# Patient Record
Sex: Male | Born: 1950
Health system: Southern US, Community
[De-identification: ages and names within clinical notes are randomized; demographics above are authoritative.]

## PROBLEM LIST (undated history)

## (undated) DIAGNOSIS — I10 Essential (primary) hypertension: Secondary | ICD-10-CM

## (undated) DIAGNOSIS — I493 Ventricular premature depolarization: Secondary | ICD-10-CM

## (undated) DIAGNOSIS — F0390 Unspecified dementia without behavioral disturbance: Secondary | ICD-10-CM

## (undated) DIAGNOSIS — G4730 Sleep apnea, unspecified: Secondary | ICD-10-CM

## (undated) DIAGNOSIS — I639 Cerebral infarction, unspecified: Secondary | ICD-10-CM

## (undated) DIAGNOSIS — M1990 Unspecified osteoarthritis, unspecified site: Secondary | ICD-10-CM

## (undated) DIAGNOSIS — E785 Hyperlipidemia, unspecified: Secondary | ICD-10-CM

## (undated) HISTORY — DX: Ventricular premature depolarization: I49.3

## (undated) HISTORY — DX: Sleep apnea, unspecified: G47.30

## (undated) HISTORY — DX: Cerebral infarction, unspecified: I63.9

## (undated) HISTORY — PX: OTHER SURGICAL HISTORY: SHX169

## (undated) HISTORY — DX: Hyperlipidemia, unspecified: E78.5

---

## 2000-03-28 ENCOUNTER — Ambulatory Visit (HOSPITAL_COMMUNITY): Admission: RE | Admit: 2000-03-28 | Discharge: 2000-03-28 | Payer: Self-pay | Admitting: Internal Medicine

## 2005-05-23 ENCOUNTER — Ambulatory Visit (HOSPITAL_COMMUNITY): Admission: RE | Admit: 2005-05-23 | Discharge: 2005-05-23 | Payer: Self-pay | Admitting: Internal Medicine

## 2008-06-27 HISTORY — PX: ANKLE ARTHROSCOPY: SUR85

## 2008-08-21 ENCOUNTER — Ambulatory Visit: Payer: Self-pay | Admitting: Vascular Surgery

## 2008-08-21 ENCOUNTER — Encounter (INDEPENDENT_AMBULATORY_CARE_PROVIDER_SITE_OTHER): Payer: Self-pay | Admitting: Internal Medicine

## 2008-08-21 ENCOUNTER — Ambulatory Visit (HOSPITAL_COMMUNITY): Admission: RE | Admit: 2008-08-21 | Discharge: 2008-08-21 | Payer: Self-pay | Admitting: Internal Medicine

## 2009-06-27 HISTORY — PX: JOINT REPLACEMENT: SHX530

## 2009-08-23 ENCOUNTER — Encounter: Admission: RE | Admit: 2009-08-23 | Discharge: 2009-08-23 | Payer: Self-pay | Admitting: Orthopaedic Surgery

## 2011-11-23 ENCOUNTER — Other Ambulatory Visit: Payer: Self-pay | Admitting: Orthopaedic Surgery

## 2011-11-23 DIAGNOSIS — M25511 Pain in right shoulder: Secondary | ICD-10-CM

## 2011-11-28 ENCOUNTER — Ambulatory Visit
Admission: RE | Admit: 2011-11-28 | Discharge: 2011-11-28 | Disposition: A | Discharge status: Home / Self Care | Payer: BC Managed Care – PPO | Source: Ambulatory Visit | Attending: Orthopaedic Surgery | Admitting: Orthopaedic Surgery

## 2011-11-28 DIAGNOSIS — M25511 Pain in right shoulder: Secondary | ICD-10-CM

## 2011-12-30 ENCOUNTER — Other Ambulatory Visit: Payer: Self-pay | Admitting: Orthopedic Surgery

## 2012-01-05 ENCOUNTER — Encounter (HOSPITAL_COMMUNITY): Payer: Self-pay

## 2012-01-05 ENCOUNTER — Encounter (HOSPITAL_COMMUNITY)
Admission: RE | Admit: 2012-01-05 | Discharge: 2012-01-05 | Disposition: A | Discharge status: Home / Self Care | Payer: 59 | Source: Ambulatory Visit | Attending: Orthopedic Surgery | Admitting: Orthopedic Surgery

## 2012-01-05 ENCOUNTER — Other Ambulatory Visit (HOSPITAL_COMMUNITY): Payer: 59

## 2012-01-05 HISTORY — DX: Essential (primary) hypertension: I10

## 2012-01-05 LAB — BASIC METABOLIC PANEL
CO2: 26 mEq/L (ref 19–32)
Calcium: 9.6 mg/dL (ref 8.4–10.5)
Glucose, Bld: 86 mg/dL (ref 70–99)
Sodium: 141 mEq/L (ref 135–145)

## 2012-01-05 LAB — TYPE AND SCREEN: ABO/RH(D): B POS

## 2012-01-05 LAB — CBC
Hemoglobin: 14.2 g/dL (ref 13.0–17.0)
Platelets: 300 10*3/uL (ref 150–400)

## 2012-01-05 LAB — SURGICAL PCR SCREEN: Staphylococcus aureus: NEGATIVE

## 2012-01-05 NOTE — Pre-Procedure Instructions (Signed)
20 CLARANCE BOLLARD  01/05/2012   Your procedure is scheduled on:  01/12/12  Report to [facility] at 530 AM.  Call this number if you have problems the morning of surgery: [phone]   Remember:   Do not eat food:After Midnight.  May have clear liquids:until Midnight .  Clear liquids include soda, tea, black coffee, apple or grape juice, broth.  Take these medicines the morning of surgery with A SIP OF WATER:verapamil    Do not wear jewelry, make-up or nail polish.  Do not wear lotions, powders, or perfumes. You may wear deodorant.  Do not shave 48 hours prior to surgery. Men may shave face and neck.  Do not bring valuables to the hospital.  Contacts, dentures or bridgework may not be worn into surgery.  Leave suitcase in the car. After surgery it may be brought to your room.  For patients admitted to the hospital, checkout time is 11:00 AM the day of discharge.   Patients discharged the day of surgery will not be allowed to drive home.  Name and phone number of your driver: family  Special Instructions: CHG Shower Use Special Wash: 1/2 bottle night before surgery and 1/2 bottle morning of surgery.   Please read over the following fact sheets that you were given: Pain Booklet, Coughing and Deep Breathing, Blood Transfusion Information, MRSA Information and Surgical Site Infection Prevention

## 2012-01-09 NOTE — Consult Note (Signed)
Anesthesia Chart Review:  Patient is a 61 year old male scheduled for a right shoulder hemi-arthroplasty by Dr. Ave Filter on 01/12/12.  History includes HTN, non-smoker, prior joint replacement surgery.    EKG from 01/05/12 showed NSR with sinus arrhythmia, borderline LA abnormality, LAD.  CXR on 01/05/12 showed slightly hyperinflated lungs without acute infiltrate.   Labs noted.  Orders were not available at the time of patient's PAT visit, so additional orders such as coags and UA are scheduled to be drawn on the day of surgery.  If same day labs are reasonable and no significant change in patient's status, then anticipate he can proceed as planned.  Shonna Chock, PA-C

## 2012-01-10 NOTE — Progress Notes (Signed)
DR [facility] OFFICE CALLED [phone])  SPOKE WITH LAURA... ADVISED THAT DR CHANDLER HAS NOT SIGNED OFF HIS ORDERS FOR THIS PT .Marland KitchenMarland Kitchen

## 2012-01-11 ENCOUNTER — Other Ambulatory Visit: Payer: Self-pay | Admitting: Orthopedic Surgery

## 2012-01-11 NOTE — Progress Notes (Signed)
Office called unit and unsure of where sign/held orders located in EPIC.  Luberta Robertson at office to look under other orders under chart review.  She verbalized understanding.//L. Aikam Vinje,RN

## 2012-01-12 ENCOUNTER — Encounter (HOSPITAL_COMMUNITY)
Admission: RE | Disposition: A | Discharge status: Home / Self Care | Payer: Self-pay | Source: Ambulatory Visit | Attending: Orthopedic Surgery

## 2012-01-12 ENCOUNTER — Inpatient Hospital Stay (HOSPITAL_COMMUNITY)
Admission: RE | Admit: 2012-01-12 | Discharge: 2012-01-13 | DRG: 483 | Disposition: A | Discharge status: Home / Self Care | Payer: 59 | Source: Ambulatory Visit | Attending: Orthopedic Surgery | Admitting: Orthopedic Surgery

## 2012-01-12 ENCOUNTER — Ambulatory Visit (HOSPITAL_COMMUNITY): Payer: 59 | Admitting: Vascular Surgery

## 2012-01-12 ENCOUNTER — Encounter (HOSPITAL_COMMUNITY): Payer: Self-pay | Admitting: Vascular Surgery

## 2012-01-12 ENCOUNTER — Ambulatory Visit (HOSPITAL_COMMUNITY): Payer: 59

## 2012-01-12 ENCOUNTER — Encounter (HOSPITAL_COMMUNITY): Payer: Self-pay | Admitting: *Deleted

## 2012-01-12 DIAGNOSIS — Z96619 Presence of unspecified artificial shoulder joint: Secondary | ICD-10-CM

## 2012-01-12 DIAGNOSIS — Z79899 Other long term (current) drug therapy: Secondary | ICD-10-CM

## 2012-01-12 DIAGNOSIS — M87029 Idiopathic aseptic necrosis of unspecified humerus: Secondary | ICD-10-CM | POA: Diagnosis present

## 2012-01-12 DIAGNOSIS — I10 Essential (primary) hypertension: Secondary | ICD-10-CM | POA: Diagnosis present

## 2012-01-12 DIAGNOSIS — M19019 Primary osteoarthritis, unspecified shoulder: Principal | ICD-10-CM

## 2012-01-12 HISTORY — PX: SHOULDER HEMI-ARTHROPLASTY: SHX5049

## 2012-01-12 LAB — DIFFERENTIAL
Basophils Absolute: 0 10*3/uL (ref 0.0–0.1)
Lymphocytes Relative: 23 % (ref 12–46)
Lymphs Abs: 1.2 10*3/uL (ref 0.7–4.0)
Monocytes Absolute: 0.6 10*3/uL (ref 0.1–1.0)
Neutro Abs: 3.3 10*3/uL (ref 1.7–7.7)

## 2012-01-12 LAB — CBC
Hemoglobin: 14.8 g/dL (ref 13.0–17.0)
MCH: 30.9 pg (ref 26.0–34.0)
MCV: 91.2 fL (ref 78.0–100.0)
RBC: 4.79 MIL/uL (ref 4.22–5.81)

## 2012-01-12 LAB — HEPATIC FUNCTION PANEL
AST: 23 U/L (ref 0–37)
Albumin: 4.1 g/dL (ref 3.5–5.2)
Total Bilirubin: 0.3 mg/dL (ref 0.3–1.2)

## 2012-01-12 LAB — URINALYSIS, ROUTINE W REFLEX MICROSCOPIC
Bilirubin Urine: NEGATIVE
Leukocytes, UA: NEGATIVE
Nitrite: NEGATIVE
Specific Gravity, Urine: 1.026 (ref 1.005–1.030)
pH: 5.5 (ref 5.0–8.0)

## 2012-01-12 SURGERY — HEMIARTHROPLASTY, SHOULDER
Anesthesia: Choice | Site: Shoulder | Laterality: Right | Wound class: Clean

## 2012-01-12 MED ORDER — METOCLOPRAMIDE HCL 5 MG/ML IJ SOLN: 5.0000 mg | Freq: Three times a day (TID) | INTRAMUSCULAR | Status: DC | PRN

## 2012-01-12 MED ORDER — HYDROMORPHONE HCL PF 1 MG/ML IJ SOLN
0.2500 mg | INTRAMUSCULAR | Status: DC | PRN
  Administered 2012-01-12 (×2): 0.5 mg via INTRAVENOUS

## 2012-01-12 MED ORDER — ONDANSETRON HCL 4 MG/2ML IJ SOLN
INTRAMUSCULAR | Status: DC | PRN
  Administered 2012-01-12: 4 mg via INTRAVENOUS

## 2012-01-12 MED ORDER — METHOCARBAMOL 500 MG PO TABS
500.0000 mg | ORAL_TABLET | Freq: Four times a day (QID) | ORAL | Status: DC | PRN
  Administered 2012-01-12 – 2012-01-13 (×2): 500 mg via ORAL
  Filled 2012-01-12 (×3): qty 1

## 2012-01-12 MED ORDER — CEFAZOLIN SODIUM-DEXTROSE 2-3 GM-% IV SOLR
INTRAVENOUS | Status: AC
  Administered 2012-01-12: 2 g via INTRAVENOUS
  Filled 2012-01-12: qty 50

## 2012-01-12 MED ORDER — PHENYLEPHRINE HCL 10 MG/ML IJ SOLN
10.0000 mg | INTRAVENOUS | Status: DC | PRN
  Administered 2012-01-12: 20 ug/min via INTRAVENOUS

## 2012-01-12 MED ORDER — OXYCODONE-ACETAMINOPHEN 5-325 MG PO TABS
ORAL_TABLET | ORAL | Status: AC
  Filled 2012-01-12: qty 1

## 2012-01-12 MED ORDER — OXYCODONE-ACETAMINOPHEN 5-325 MG PO TABS
1.0000 | ORAL_TABLET | ORAL | Status: DC | PRN
  Administered 2012-01-12 – 2012-01-13 (×5): 1 via ORAL
  Filled 2012-01-12 (×4): qty 1

## 2012-01-12 MED ORDER — CEFAZOLIN SODIUM 1-5 GM-% IV SOLN
INTRAVENOUS | Status: AC
  Administered 2012-01-12: 1000 mg
  Filled 2012-01-12: qty 50

## 2012-01-12 MED ORDER — ONDANSETRON HCL 4 MG PO TABS: 4.0000 mg | ORAL_TABLET | Freq: Four times a day (QID) | ORAL | Status: DC | PRN

## 2012-01-12 MED ORDER — POVIDONE-IODINE 7.5 % EX SOLN
Freq: Once | CUTANEOUS | Status: DC
  Filled 2012-01-12: qty 118

## 2012-01-12 MED ORDER — ASPIRIN EC 325 MG PO TBEC
325.0000 mg | DELAYED_RELEASE_TABLET | Freq: Two times a day (BID) | ORAL | Status: DC
  Administered 2012-01-12 – 2012-01-13 (×2): 325 mg via ORAL
  Filled 2012-01-12 (×3): qty 1

## 2012-01-12 MED ORDER — NEOSTIGMINE METHYLSULFATE 1 MG/ML IJ SOLN
INTRAMUSCULAR | Status: DC | PRN
  Administered 2012-01-12: 5 mg via INTRAVENOUS

## 2012-01-12 MED ORDER — PROPOFOL 10 MG/ML IV EMUL
INTRAVENOUS | Status: DC | PRN
  Administered 2012-01-12: 200 mg via INTRAVENOUS

## 2012-01-12 MED ORDER — ACETAMINOPHEN 10 MG/ML IV SOLN
INTRAVENOUS | Status: AC
  Filled 2012-01-12: qty 100

## 2012-01-12 MED ORDER — CEFAZOLIN SODIUM-DEXTROSE 2-3 GM-% IV SOLR: 2.0000 g | INTRAVENOUS | Status: DC

## 2012-01-12 MED ORDER — MORPHINE SULFATE 2 MG/ML IJ SOLN: 2.0000 mg | INTRAMUSCULAR | Status: DC | PRN

## 2012-01-12 MED ORDER — METHOCARBAMOL 100 MG/ML IJ SOLN
500.0000 mg | Freq: Four times a day (QID) | INTRAVENOUS | Status: DC | PRN
  Administered 2012-01-12: 500 mg via INTRAVENOUS
  Filled 2012-01-12: qty 5

## 2012-01-12 MED ORDER — ONDANSETRON HCL 4 MG/2ML IJ SOLN: 4.0000 mg | Freq: Four times a day (QID) | INTRAMUSCULAR | Status: DC | PRN

## 2012-01-12 MED ORDER — FENTANYL CITRATE 0.05 MG/ML IJ SOLN
INTRAMUSCULAR | Status: DC | PRN
  Administered 2012-01-12: 50 ug via INTRAVENOUS
  Administered 2012-01-12 (×2): 100 ug via INTRAVENOUS

## 2012-01-12 MED ORDER — MIDAZOLAM HCL 5 MG/5ML IJ SOLN
INTRAMUSCULAR | Status: DC | PRN
  Administered 2012-01-12 (×2): 1 mg via INTRAVENOUS

## 2012-01-12 MED ORDER — MEPERIDINE HCL 25 MG/ML IJ SOLN: 6.2500 mg | INTRAMUSCULAR | Status: DC | PRN

## 2012-01-12 MED ORDER — ALUM & MAG HYDROXIDE-SIMETH 200-200-20 MG/5ML PO SUSP: 30.0000 mL | ORAL | Status: DC | PRN

## 2012-01-12 MED ORDER — LACTATED RINGERS IV SOLN
INTRAVENOUS | Status: DC | PRN
  Administered 2012-01-12 (×2): via INTRAVENOUS

## 2012-01-12 MED ORDER — MENTHOL 3 MG MT LOZG: 1.0000 | LOZENGE | OROMUCOSAL | Status: DC | PRN

## 2012-01-12 MED ORDER — VERAPAMIL HCL ER 240 MG PO TBCR
240.0000 mg | EXTENDED_RELEASE_TABLET | Freq: Every day | ORAL | Status: DC
  Administered 2012-01-13: 240 mg via ORAL
  Filled 2012-01-12: qty 1

## 2012-01-12 MED ORDER — GLYCOPYRROLATE 0.2 MG/ML IJ SOLN
INTRAMUSCULAR | Status: DC | PRN
  Administered 2012-01-12: 0.6 mg via INTRAVENOUS

## 2012-01-12 MED ORDER — HYDROMORPHONE HCL PF 1 MG/ML IJ SOLN
INTRAMUSCULAR | Status: AC
  Filled 2012-01-12: qty 1

## 2012-01-12 MED ORDER — DOCUSATE SODIUM 100 MG PO CAPS
100.0000 mg | ORAL_CAPSULE | Freq: Two times a day (BID) | ORAL | Status: DC
  Administered 2012-01-12 – 2012-01-13 (×2): 100 mg via ORAL
  Filled 2012-01-12 (×3): qty 1

## 2012-01-12 MED ORDER — FLEET ENEMA 7-19 GM/118ML RE ENEM: 1.0000 | ENEMA | Freq: Once | RECTAL | Status: AC | PRN

## 2012-01-12 MED ORDER — ACETAMINOPHEN 325 MG PO TABS: 650.0000 mg | ORAL_TABLET | Freq: Four times a day (QID) | ORAL | Status: DC | PRN

## 2012-01-12 MED ORDER — ACETAMINOPHEN 650 MG RE SUPP: 650.0000 mg | Freq: Four times a day (QID) | RECTAL | Status: DC | PRN

## 2012-01-12 MED ORDER — SODIUM CHLORIDE 0.9 % IR SOLN
Status: DC | PRN
  Administered 2012-01-12: 3000 mL

## 2012-01-12 MED ORDER — SODIUM CHLORIDE 0.9 % IV SOLN
INTRAVENOUS | Status: DC
  Administered 2012-01-13: 02:00:00 via INTRAVENOUS

## 2012-01-12 MED ORDER — ACETAMINOPHEN 10 MG/ML IV SOLN
1000.0000 mg | Freq: Once | INTRAVENOUS | Status: AC | PRN
  Administered 2012-01-12: 1000 mg via INTRAVENOUS

## 2012-01-12 MED ORDER — ROCURONIUM BROMIDE 100 MG/10ML IV SOLN
INTRAVENOUS | Status: DC | PRN
  Administered 2012-01-12: 50 mg via INTRAVENOUS
  Administered 2012-01-12 (×2): 10 mg via INTRAVENOUS

## 2012-01-12 MED ORDER — BISACODYL 5 MG PO TBEC: 5.0000 mg | DELAYED_RELEASE_TABLET | Freq: Every day | ORAL | Status: DC | PRN

## 2012-01-12 MED ORDER — 0.9 % SODIUM CHLORIDE (POUR BTL) OPTIME
TOPICAL | Status: DC | PRN
  Administered 2012-01-12: 1000 mL

## 2012-01-12 MED ORDER — ZOLPIDEM TARTRATE 5 MG PO TABS: 5.0000 mg | ORAL_TABLET | Freq: Every evening | ORAL | Status: DC | PRN

## 2012-01-12 MED ORDER — METOCLOPRAMIDE HCL 10 MG PO TABS: 5.0000 mg | ORAL_TABLET | Freq: Three times a day (TID) | ORAL | Status: DC | PRN

## 2012-01-12 MED ORDER — PROMETHAZINE HCL 25 MG/ML IJ SOLN: 6.2500 mg | INTRAMUSCULAR | Status: DC | PRN

## 2012-01-12 MED ORDER — PHENOL 1.4 % MT LIQD: 1.0000 | OROMUCOSAL | Status: DC | PRN

## 2012-01-12 MED ORDER — BUPIVACAINE-EPINEPHRINE PF 0.5-1:200000 % IJ SOLN
INTRAMUSCULAR | Status: DC | PRN
  Administered 2012-01-12: 30 mL

## 2012-01-12 MED ORDER — DIPHENHYDRAMINE HCL 12.5 MG/5ML PO ELIX: 12.5000 mg | ORAL_SOLUTION | ORAL | Status: DC | PRN

## 2012-01-12 MED ORDER — CEFAZOLIN SODIUM 1-5 GM-% IV SOLN
1.0000 g | Freq: Four times a day (QID) | INTRAVENOUS | Status: AC
  Administered 2012-01-12 – 2012-01-13 (×2): 1 g via INTRAVENOUS
  Filled 2012-01-12 (×3): qty 50

## 2012-01-12 MED ORDER — LIDOCAINE HCL (CARDIAC) 20 MG/ML IV SOLN
INTRAVENOUS | Status: DC | PRN
  Administered 2012-01-12: 100 mg via INTRAVENOUS

## 2012-01-12 SURGICAL SUPPLY — 64 items
BLADE SAW SAG 73X25 THK (BLADE) ×1
BLADE SAW SGTL 73X25 THK (BLADE) ×1 IMPLANT
BLADE SURG 15 STRL LF DISP TIS (BLADE) ×1 IMPLANT
BLADE SURG 15 STRL SS (BLADE) ×2
BOWL SMART MIX CTS (DISPOSABLE) IMPLANT
CHLORAPREP W/TINT 26ML (MISCELLANEOUS) ×2 IMPLANT
CLOTH BEACON ORANGE TIMEOUT ST (SAFETY) ×2 IMPLANT
COVER SURGICAL LIGHT HANDLE (MISCELLANEOUS) ×2 IMPLANT
DRAPE INCISE IOBAN 66X45 STRL (DRAPES) ×2 IMPLANT
DRAPE SURG 17X23 STRL (DRAPES) ×2 IMPLANT
DRAPE U-SHAPE 47X51 STRL (DRAPES) ×2 IMPLANT
DRSG MEPILEX BORDER 4X8 (GAUZE/BANDAGES/DRESSINGS) ×1 IMPLANT
DRSG PAD ABDOMINAL 8X10 ST (GAUZE/BANDAGES/DRESSINGS) ×2 IMPLANT
ELECT CAUTERY BLADE 6.4 (BLADE) ×1 IMPLANT
ELECT REM PT RETURN 9FT ADLT (ELECTROSURGICAL) ×2
ELECTRODE REM PT RTRN 9FT ADLT (ELECTROSURGICAL) ×1 IMPLANT
EVACUATOR 1/8 PVC DRAIN (DRAIN) ×1 IMPLANT
FACESHIELD STD STERILE (MASK) ×1 IMPLANT
GLOVE BIO SURGEON STRL SZ7 (GLOVE) ×2 IMPLANT
GLOVE BIO SURGEON STRL SZ7.5 (GLOVE) ×3 IMPLANT
GLOVE BIOGEL PI IND STRL 8 (GLOVE) ×1 IMPLANT
GLOVE BIOGEL PI INDICATOR 8 (GLOVE) ×3
GLOVE ECLIPSE 8.0 STRL XLNG CF (GLOVE) ×2 IMPLANT
GLOVE NEODERM STER SZ 7 (GLOVE) ×2 IMPLANT
GOWN BRE IMP SLV AUR XL STRL (GOWN DISPOSABLE) ×2 IMPLANT
GOWN PREVENTION PLUS LG XLONG (DISPOSABLE) ×2 IMPLANT
GOWN STRL NON-REIN LRG LVL3 (GOWN DISPOSABLE) ×3 IMPLANT
HEMOSTAT SURGICEL 2X14 (HEMOSTASIS) IMPLANT
HOOD PEEL AWAY FACE SHEILD DIS (HOOD) ×3 IMPLANT
KIT BASIN OR (CUSTOM PROCEDURE TRAY) ×2 IMPLANT
KIT ROOM TURNOVER OR (KITS) ×2 IMPLANT
MANIFOLD NEPTUNE II (INSTRUMENTS) ×2 IMPLANT
NDL HYPO 25GX1X1/2 BEV (NEEDLE) ×1 IMPLANT
NDL MAYO TROCAR (NEEDLE) ×1 IMPLANT
NEEDLE HYPO 25GX1X1/2 BEV (NEEDLE) IMPLANT
NEEDLE MAYO TROCAR (NEEDLE) ×2 IMPLANT
NS IRRIG 1000ML POUR BTL (IV SOLUTION) ×2 IMPLANT
PACK SHOULDER (CUSTOM PROCEDURE TRAY) ×2 IMPLANT
PAD ARMBOARD 7.5X6 YLW CONV (MISCELLANEOUS) ×4 IMPLANT
RETRIEVER SUT HEWSON (MISCELLANEOUS) IMPLANT
SET PAD SHOULDER ACCESS (MISCELLANEOUS) ×2 IMPLANT
SLING ARM IMMOBILIZER LRG (SOFTGOODS) ×2 IMPLANT
SLING ARM IMMOBILIZER MED (SOFTGOODS) IMPLANT
SPONGE GAUZE 4X4 12PLY (GAUZE/BANDAGES/DRESSINGS) ×2 IMPLANT
SPONGE LAP 18X18 X RAY DECT (DISPOSABLE) ×4 IMPLANT
STRIP CLOSURE SKIN 1/2X4 (GAUZE/BANDAGES/DRESSINGS) ×2 IMPLANT
SUCTION FRAZIER TIP 10 FR DISP (SUCTIONS) ×2 IMPLANT
SUPPORT WRAP ARM LG (MISCELLANEOUS) ×1 IMPLANT
SUT ETHIBOND 2 OS 4 DA (SUTURE) ×2 IMPLANT
SUT ETHIBOND NAB CT1 #1 30IN (SUTURE) ×4 IMPLANT
SUT FIBERWIRE #2 38 T-5 BLUE (SUTURE) ×8
SUT MNCRL AB 4-0 PS2 18 (SUTURE) ×2 IMPLANT
SUT SILK 2 0 TIES 17X18 (SUTURE) ×2
SUT SILK 2-0 18XBRD TIE BLK (SUTURE) ×1 IMPLANT
SUT VIC AB 0 CTB1 27 (SUTURE) ×4 IMPLANT
SUT VIC AB 2-0 CT1 27 (SUTURE) ×4
SUT VIC AB 2-0 CT1 TAPERPNT 27 (SUTURE) ×3 IMPLANT
SUTURE FIBERWR #2 38 T-5 BLUE (SUTURE) ×8 IMPLANT
SYR CONTROL 10ML LL (SYRINGE) ×1 IMPLANT
TOWEL OR 17X24 6PK STRL BLUE (TOWEL DISPOSABLE) ×2 IMPLANT
TOWEL OR 17X26 10 PK STRL BLUE (TOWEL DISPOSABLE) ×2 IMPLANT
TRAY FOLEY CATH 14FR (SET/KITS/TRAYS/PACK) IMPLANT
WATER STERILE IRR 1000ML POUR (IV SOLUTION) ×2 IMPLANT
YANKAUER SUCT BULB TIP NO VENT (SUCTIONS) ×2 IMPLANT

## 2012-01-12 NOTE — Anesthesia Procedure Notes (Signed)
Anesthesia Regional Block:  Interscalene brachial plexus block  Pre-Anesthetic Checklist: ,, timeout performed, Correct Patient, Correct Site, Correct Laterality, Correct Procedure, Correct Position, site marked, Risks and benefits discussed, at surgeon's request and post-op pain management  Laterality: Upper and Right  Prep: Betadine and chloraprep       Needles:  Injection technique: Single-shot  Needle Type: Stimulator Needle - 40      Needle Gauge: 22 and 22 G  Needle insertion depth: 1 cm   Additional Needles:  Procedures: nerve stimulator Interscalene brachial plexus block  Nerve Stimulator or Paresthesia:  Response: Twitch elicited, 0.5 mA, 0.3 ms,   Additional Responses:   Narrative:  Start time: 01/12/2012 7:00 AM End time: 01/12/2012 7:14 AM  Performed by: Personally  Anesthesiologist: Alma Friendly, MD  Additional Notes: Block assessed prior to start of surgery  Interscalene brachial plexus block

## 2012-01-12 NOTE — Preoperative (Signed)
Beta Blockers   Reason not to administer Beta Blockers:Not Applicable 

## 2012-01-12 NOTE — H&P (Signed)
Bryan Hart is an 61 y.o. male.   Chief Complaint: R shoulder pain HPI: R shoulder AVN after prior RCR.  Large defect of humeral head.  Past Medical History  Diagnosis Date  . Hypertension     dr  ed green      stress test   12 yrs ago    Past Surgical History  Procedure Date  . Knee surgeries     x2  . Joint replacement     rt shoulder rotator cuff repair  . Ankle arthroscopy     History reviewed. No pertinent family history. Social History:  reports that he has never smoked. He does not have any smokeless tobacco history on file. He reports that he drinks alcohol. He reports that he does not use illicit drugs.  Allergies:  Allergies  Allergen Reactions  . Adhesive (Tape) Rash    Medications Prior to Admission  Medication Sig Dispense Refill  . Omega-3 Fatty Acids (FISH OIL) 1200 MG CAPS Take 1 capsule by mouth.      Marland Kitchen OVER THE COUNTER MEDICATION Take 1 capsule by mouth daily. Glucosamine-Chondroitin 1500-1200      . verapamil (CALAN-SR) 240 MG CR tablet Take 240 mg by mouth daily.        Results for orders placed during the hospital encounter of 01/12/12 (from the past 48 hour(s))  URINALYSIS, ROUTINE W REFLEX MICROSCOPIC     Status: Normal   Collection Time   01/12/12  6:25 AM      Component Value Range Comment   Color, Urine YELLOW  YELLOW    APPearance CLEAR  CLEAR    Specific Gravity, Urine 1.026  1.005 - 1.030    pH 5.5  5.0 - 8.0    Glucose, UA NEGATIVE  NEGATIVE mg/dL    Hgb urine dipstick NEGATIVE  NEGATIVE    Bilirubin Urine NEGATIVE  NEGATIVE    Ketones, ur NEGATIVE  NEGATIVE mg/dL    Protein, ur NEGATIVE  NEGATIVE mg/dL    Urobilinogen, UA 0.2  0.0 - 1.0 mg/dL    Nitrite NEGATIVE  NEGATIVE    Leukocytes, UA NEGATIVE  NEGATIVE MICROSCOPIC NOT DONE ON URINES WITH NEGATIVE PROTEIN, BLOOD, LEUKOCYTES, NITRITE, OR GLUCOSE <1000 mg/dL.   No results found.  Review of Systems  All other systems reviewed and are negative.    Blood pressure 112/77,  pulse 74, temperature 98 F (36.7 C), temperature source Oral, resp. rate 18, SpO2 96.00%. Physical Exam  Constitutional: He is oriented to person, place, and time. He appears well-developed and well-nourished.  HENT:  Head: Atraumatic.  Eyes: EOM are normal.  Cardiovascular: Intact distal pulses.   Respiratory: Effort normal.  Musculoskeletal:       Right shoulder: He exhibits pain.  Neurological: He is alert and oriented to person, place, and time.  Skin: Skin is warm and dry.  Psychiatric: He has a normal mood and affect.     Assessment/Plan R shoulder AVN after prior RCR.  Large defect of humeral head. Plan R shoulder hemiarthroplasty possible RCR Risks / benefits of surgery discussed Consent on chart  NPO for OR Preop antibiotics   Jaydan Chretien WILLIAM 01/12/2012, 7:10 AM

## 2012-01-12 NOTE — Anesthesia Preprocedure Evaluation (Addendum)
Anesthesia Evaluation  Patient identified by MRN, date of birth, ID band Patient awake    Reviewed: Allergy & Precautions, H&P , NPO status   History of Anesthesia Complications Negative for: history of anesthetic complications  Airway Mallampati: II  Neck ROM: Full    Dental  (+) Teeth Intact   Pulmonary Recent URI ,  breath sounds clear to auscultation        Cardiovascular hypertension, + dysrhythmias Rhythm:Irregular Rate:Abnormal     Neuro/Psych negative neurological ROS  negative psych ROS   GI/Hepatic negative GI ROS, Neg liver ROS,   Endo/Other  negative endocrine ROS  Renal/GU negative Renal ROS     Musculoskeletal   Abdominal   Peds  Hematology   Anesthesia Other Findings   Reproductive/Obstetrics                          Anesthesia Physical Anesthesia Plan  ASA: II  Anesthesia Plan: General   Post-op Pain Management:    Induction: Intravenous  Airway Management Planned: Oral ETT  Additional Equipment:   Intra-op Plan:   Post-operative Plan: Extubation in OR  Informed Consent:   Dental advisory given  Plan Discussed with: CRNA and Surgeon  Anesthesia Plan Comments:         Anesthesia Quick Evaluation

## 2012-01-12 NOTE — Transfer of Care (Signed)
Immediate Anesthesia Transfer of Care Note  Patient: Bryan Hart  Procedure(s) Performed: Procedure(s) (LRB): SHOULDER HEMI-ARTHROPLASTY (Right)  Patient Location: PACU  Anesthesia Type: General  Level of Consciousness: awake, alert  and oriented  Airway & Oxygen Therapy: Patient Spontanous Breathing and Patient connected to face mask oxygen  Post-op Assessment: Report given to PACU RN and Post -op Vital signs reviewed and stable  Post vital signs: Reviewed and stable  Complications: No apparent anesthesia complications

## 2012-01-12 NOTE — Progress Notes (Signed)
CMET ordered and BMET drawn at PAT due to no orders. HFP added DOS.

## 2012-01-12 NOTE — Op Note (Signed)
Procedure(s): SHOULDER HEMI-ARTHROPLASTY Procedure Note  Bryan Hart male 61 y.o. 01/12/2012  Procedure(s) and Anesthesia Type:    * #1 right SHOULDER HEMI-ARTHROPLASTY  #2 right shoulder open rotator cuff repair  Surgeon(s) and Role:    * Mable Paris, MD - Primary    * Valeria Batman, MD - Assisting   Indications:  61 y.o. male  With endstage right shoulder avascular necrosis Pain and dysfunction interfered with quality of life and nonoperative treatment with activity modification, NSAIDS and injections failed. He had significant mechanical symptoms. He had had a prior rotator cuff repair with MRI findings of partial re tear.     Surgeon: Mable Paris   Assistants: Norlene Campbell M.D.  Anesthesia: General endotracheal anesthesia with preoperative interscalene block     Procedure Detail  SHOULDER HEMI-ARTHROPLASTY  Findings: He had a large cavitary defect of the humeral articular surface consistent with focal avascular necrosis. The anterior supraspinatus was intact as was the subscapularis. Just posterior to the previous repair there was a small full-thickness tear in the supraspinatus. This was repaired using one #2 FiberWire in an inverted mattress configuration.  Estimated Blood Loss:  200 mL         Drains: 1 medium hemovac  Blood Given: none          Specimens: none        Complications:  * No complications entered in OR log *         Disposition: PACU - hemodynamically stable.         Condition: stable    Procedure:   The patient was identified in the preoperative holding area where I personally marked the operative extremity after verifying with the patient and consent. He  was taken to the operating room where He was transferred to the   operative table.  The patient received an interscalene block in   the holding area by the attending anesthesiologist.  General anesthesia was induced   in the operating room without  complication.  The patient did receive IV  Ancef prior to the commencement of the procedure.  The patient was   placed in the beach-chair position with the back raised about 30   degrees.  The nonoperative extremity and head and neck were carefully   positioned and padded protecting against neurovascular compromise.  The   left upper extremity was then prepped and draped in the standard sterile   fashion.    The appropriate operative time-out was performed with   Anesthesia, the perioperative staff, as well as myself and we all agreed   that the right side was the correct operative site.  An approximately   10 cm incision was made from the tip of the coracoid to the center point of the   humerus at the level of the axilla.  Dissection was carried down sharply   through subcutaneous tissues and cephalic vein was identified and taken   laterally with the deltoid.  The pectoralis major was taken medially.  The   upper 1 cm of the pectoralis major was released from its attachment on   the humerus.  The clavipectoral fascia was incised just lateral to the   conjoined tendon.  This incision was carried up to but not into the   coracoacromial ligament.  Digital palpation was used to prove   integrity of the axillary nerve which was protected throughout the   procedure.  Musculocutaneous nerve was not palpated in the operative  field.  Conjoined tendon was then retracted gently medially and the   deltoid laterally.  Anterior circumflex humeral vessels were clamped and   coagulated.  The soft tissues overlying the biceps was incised and this   incision was carried across the transverse humeral ligament to the base   of the coracoid.  The biceps was tenodesed to the soft tissue just above   pectoralis major and the remaining portion of the biceps superiorly was   excised.  An osteotomy was performed at the lesser tuberosity and the   subscapularis was freed from the underlying capsule.  Capsule was  then   released all the way down to the 6 o'clock position of the humeral head.   The humeral head was then delivered with simultaneous adduction,   extension and external rotation.  All humeral osteophytes were removed   and the anatomic neck of the humerus was marked and cut free hand at   approximately 25 degrees retroversion within about 3 mm of the cuff   reflection posteriorly.  At that point, the humeral head was retracted posteriorly with   a Fukuda retractor  And a retractor was placed anteriorly on the glenoid. The glenoid was examined. There was some mild softening of the cartilage the cartilage was largely intact. The remainder of the biceps long head tendon was excised. The labrum was carefully examined. It was not significantly hypertrophied and therefore it was left in place. Given the fact that he did not have any preoperative stiffness I did not perform any releases. The proximal humerus was then again exposed.    The humerus was then sequentially reamed going from 6 to 14 by 2 mm incriments. The 14 mm reamer was found to have appropriate cortical contact.  A   box osteotome was then used and a 14-mm broach.  The broach handle was   removed and the trial head was placed.   Calcar reamer was used.The eccentric 52 x 18 head fit best.  With the trial implantation of the component, there was   approximately 50% posterior translation with immediate snap back to the   anatomic position.  With forward elevation, there was no tendency   towards posterior subluxation.   The trial was removed and the final implant was prepared on a back table.  The implant was then impacted and   achieved excellent anatomic reconstruction of the proximal humerus.  #2 Ethibond was placed around the neck prior to impaction.  The joint   was then copiously irrigated with pulse lavage.  The subscapularis and   lesser tuberosity osteotomy were then repaired using 2 #2 Ethibonds   and the #2 Ethibonds around the  neck of the implant in a double row type   repair.  One #1 Ethibond was placed at the rotator interval just above   the lesser tuberosity.  After repair of the lesser tuberosity, a medium   Hemovac was placed out anterolaterally and again copious irrigation was   used.   Skin was closed with 2-0 Vicryl sutures in the deep dermal layer and 4-0 Monocryl in a subcuticular  running fashion.  Sterile dressings were then applied including Steri- Strips, 4x4s, ABDs and tape.  The patient was placed in a sling and allowed to awaken from general anesthesia and taken to the recovery room in stable  condition.      POSTOPERATIVE PLAN:  Early passive range of motion will be allowed with the goal of 40 degrees external rotation  and a 140 degrees forward elevation.  No internal rotation at this time.  No active motion of the arm until the lesser tuberosity heels.  The patient will likely be kept in the hospital for 2 days and then discharged home.

## 2012-01-12 NOTE — Anesthesia Postprocedure Evaluation (Signed)
  Anesthesia Post-op Note  Patient: Bryan Hart  Procedure(s) Performed: Procedure(s) (LRB): SHOULDER HEMI-ARTHROPLASTY (Right)  Patient Location: PACU  Anesthesia Type: GA combined with regional for post-op pain  Level of Consciousness: awake  Airway and Oxygen Therapy: Patient Spontanous Breathing  Post-op Pain: mild  Post-op Assessment: Post-op Vital signs reviewed  Post-op Vital Signs: stable  Complications: No apparent anesthesia complications

## 2012-01-13 ENCOUNTER — Encounter (HOSPITAL_COMMUNITY): Payer: Self-pay | Admitting: Orthopedic Surgery

## 2012-01-13 DIAGNOSIS — M19019 Primary osteoarthritis, unspecified shoulder: Secondary | ICD-10-CM

## 2012-01-13 LAB — BASIC METABOLIC PANEL
GFR calc Af Amer: 86 mL/min — ABNORMAL LOW (ref >90)
GFR calc non Af Amer: 74 mL/min — ABNORMAL LOW (ref >90)
Glucose, Bld: 104 mg/dL — ABNORMAL HIGH (ref 70–99)
Potassium: 4.1 mEq/L (ref 3.5–5.1)
Sodium: 136 mEq/L (ref 135–145)

## 2012-01-13 LAB — CBC
Hemoglobin: 11.5 g/dL — ABNORMAL LOW (ref 13.0–17.0)
MCHC: 32.8 g/dL (ref 30.0–36.0)
RDW: 13.8 % (ref 11.5–15.5)

## 2012-01-13 MED ORDER — OXYCODONE-ACETAMINOPHEN 5-325 MG PO TABS: 1.0000 | ORAL_TABLET | ORAL | Status: AC | PRN

## 2012-01-13 NOTE — Progress Notes (Signed)
PATIENT ID: Bryan Hart   1 Day Post-Op Procedure(s) (LRB): SHOULDER HEMI-ARTHROPLASTY (Right)  Subjective: Pain well-controlled. The patient wants to go home today.  Objective:  Filed Vitals:   01/13/12 0539  BP: 129/75  Pulse: 63  Temp: 97.6 F (36.4 C)  Resp: 18     Right shoulder dressing clean dry and intact. The drain was removed today. He has intact axillary, musculocutaneous, M./U/R. function and sensation. 2+ radial pulse. External rotation is to about 30 and forward flexion to 70 comfortably.  Labs:   [facility] 01/13/12 0505 01/12/12 0636  HGB 11.5* 14.8   Basename 01/13/12 0505 01/12/12 0636  WBC 6.6 5.3  RBC 3.84* 4.79  HCT 35.1* 43.7  PLT 207 267   Basename 01/13/12 0505  NA 136  K 4.1  CL 103  CO2 25  BUN 10  CREATININE 1.05  GLUCOSE 104*  CALCIUM 8.4    Assessment and Plan: Doing well postoperative day #1 status post right total shoulder replacement Drain DC'd today I think the patient will be okay for discharge today after occupational therapy. I went over the exercises with him this morning.  VTE proph: Aspirin and SCDs

## 2012-01-13 NOTE — Discharge Summary (Signed)
Patient ID: Bryan Hart MRN: [id number] DOB/AGE: [birth year] 61 y.o.  Admit date: 01/12/2012 Discharge date: 01/13/2012  Admission Diagnoses:  Principal Problem:  *Primary localized osteoarthrosis, shoulder region   Discharge Diagnoses:  Same  Past Medical History  Diagnosis Date  . Hypertension     dr  ed green      stress test   12 yrs ago    Surgeries: Procedure(s): SHOULDER HEMI-ARTHROPLASTY on 01/12/2012   Consultants:    Discharged Condition: Improved  Hospital Course: Bryan Hart is an 61 y.o. male who was admitted 01/12/2012 for operative treatment ofPrimary localized osteoarthrosis, shoulder region. Patient has severe unremitting pain that affects sleep, daily activities, and work/hobbies. After pre-op clearance the patient was taken to the operating room on 01/12/2012 and underwent  Procedure(s): SHOULDER HEMI-ARTHROPLASTY.    Patient was given perioperative antibiotics: Anti-infectives     Start     Dose/Rate Route Frequency Ordered Stop   01/12/12 2130   ceFAZolin (ANCEF) IVPB 1 g/50 mL premix        1 g 100 mL/hr over 30 Minutes Intravenous Every 6 hours 01/12/12 1509 01/13/12 0326   01/12/12 1516   ceFAZolin (ANCEF) 1-5 GM-% IVPB     Comments: SHAVER, MARYANN: cabinet override         01/12/12 1516 01/12/12 1514   01/12/12 0615   ceFAZolin (ANCEF) 2-3 GM-% IVPB SOLR     Comments: Karleen Hampshire, [place]: cabinet override         01/12/12 0615 01/12/12 0745   01/12/12 0609   ceFAZolin (ANCEF) IVPB 2 g/50 mL premix  Status:  Discontinued        2 g 100 mL/hr over 30 Minutes Intravenous 60 min pre-op 01/12/12 0609 01/12/12 1628           Patient was given sequential compression devices, early ambulation, and chemoprophylaxis to prevent DVT.  Patient benefited maximally from hospital stay and there were no complications.    Recent vital signs: Patient Vitals for the past 24 hrs:  BP Temp Temp src Pulse Resp SpO2  01/13/12 0539 129/75 mmHg 97.6 F (36.4  C) - 63  18  95 %  01/13/12 0154 134/72 mmHg 98.3 F (36.8 C) - 65  18  98 %  01/12/12 2144 113/66 mmHg 98.3 F (36.8 C) - 64  18  97 %  01/12/12 1634 138/83 mmHg 98 F (36.7 C) Oral 81  16  97 %  01/12/12 1607 - 98.5 F (36.9 C) - 73  16  98 %  01/12/12 1515 - - - 72  15  98 %  01/12/12 1441 - - - 73  16  97 %  01/12/12 1434 130/76 mmHg - - - - -  01/12/12 1400 - - - 71  20  96 %  01/12/12 1349 137/74 mmHg - - - - -  01/12/12 1307 - - - 70  17  95 %  01/12/12 1304 124/75 mmHg - - - - -  01/12/12 1228 - - - 71  16  96 %  01/12/12 1219 133/76 mmHg - - - - -  01/12/12 1144 - - - 71  14  98 %  01/12/12 1134 133/81 mmHg - - - - -  01/12/12 1124 - - - 70  10  97 %  01/12/12 1123 - - - 72  12  98 %  01/12/12 1119 131/83 mmHg - - - - -  01/12/12 1106 147/86 mmHg - - 73  16  98 %  15-Jan-2012 1053 - - - 73  18  100 %  01/15/2012 1049 144/84 mmHg - - - - -  Jan 15, 2012 1045 - - - 68  12  100 %  2012/01/15 1034 137/78 mmHg - - - - -  January 15, 2012 1030 - - - 66  9  99 %  01/15/12 1022 - 96.8 F (36 C) - - - -  2012-01-15 1019 149/85 mmHg - - - - -     Recent laboratory studies:  Basename 01/13/12 0505 2012/01/15 0636  WBC 6.6 5.3  HGB 11.5* 14.8  HCT 35.1* 43.7  PLT 207 267  NA 136 --  K 4.1 --  CL 103 --  CO2 25 --  BUN 10 --  CREATININE 1.05 --  GLUCOSE 104* --  INR -- --  CALCIUM 8.4 --     Discharge Medications:   Medication List  As of 01/13/2012  8:19 AM   TAKE these medications         Fish Oil 1200 MG Caps   Take 1 capsule by mouth.      OVER THE COUNTER MEDICATION   Take 1 capsule by mouth daily. Glucosamine-Chondroitin 1500-1200      oxyCODONE-acetaminophen 5-325 MG per tablet   Commonly known as: PERCOCET/ROXICET   Take 1-2 tablets by mouth every 4 (four) hours as needed.      verapamil 240 MG CR tablet   Commonly known as: CALAN-SR   Take 240 mg by mouth daily.            Diagnostic Studies: Dg Chest 2 View  01/05/2012  *RADIOLOGY REPORT*  Clinical Data:  Preoperative assessment for right shoulder hemiarthroplasty, hypertension  CHEST - 2 VIEW  Comparison: None  Findings: Normal heart size and pulmonary vascularity. Tortuous aorta. Lungs appear slightly hyperinflated but clear. No pleural effusion or pneumothorax. Endplate spur formation throughout thoracic spine.  IMPRESSION: Slightly hyperinflated lungs without acute infiltrate.  Original Report Authenticated By: Lollie Marrow, M.D.   Dg Shoulder Right  01-15-2012  *RADIOLOGY REPORT*  Clinical Data: Status post right shoulder arthroplasty.  RIGHT SHOULDER - 2+ VIEW  Comparison: No priors.  Findings: The patient is status post right shoulder hemiarthroplasty.  The humeral head is high-riding, likely related to chronic rotator cuff disease.  The stem of the prosthesis extends to the mid diaphysis.  No obvious periprosthetic fracture or other immediate complicating features.  Gas in the overlying soft tissues with a surgical drain in place.  Degenerative changes of osteoarthritis at the acromioclavicular joint.  IMPRESSION: 1.  Postoperative changes of right shoulder hemiarthroplasty without immediate complicating features, as above.  Original Report Authenticated By: Florencia Reasons, M.D.    Disposition: Final discharge disposition not confirmed  Discharge Orders    Future Orders Please Complete By Expires   Diet - low sodium heart healthy      Call MD / Call 911      Comments:   If you experience chest pain or shortness of breath, CALL 911 and be transported to the hospital emergency room.  If you develope a fever above 101 F, pus (white drainage) or increased drainage or redness at the wound, or calf pain, call your surgeon's office.   Constipation Prevention      Comments:   Drink plenty of fluids.  Prune juice may be helpful.  You may use a stool softener, such as Colace (over the counter) 100 mg twice a day.  Use  MiraLax (over the counter) for constipation as needed.   Increase activity  slowly as tolerated      Driving restrictions      Comments:   No driving for 6 weeks      Follow-up Information    Follow up with Mable Paris, MD in 2 weeks.   Contact information:   [facility] [address], [address] [place] [address] [phone]           Signed: Mable Paris 01/13/2012, 8:19 AM

## 2012-01-13 NOTE — Evaluation (Signed)
Occupational Therapy Evaluation Patient Details Name: Bryan Hart MRN: [id number] DOB: [birth year] Today's Date: 01/13/2012 Time: 8469-6295 OT Time Calculation (min): 35 min  OT Assessment / Plan / Recommendation Clinical Impression  Pt s/p Rt hemi-arthroplasty and open RCR. All education completed- pt and wife demonstrate understanding of exercises and adaptations. No further acute OT indicated at this time as pt to d/c this afternoon.     OT Assessment  Patient does not need any further OT services    Follow Up Recommendations  No OT follow up    Barriers to Discharge      Equipment Recommendations  None recommended by OT    Recommendations for Other Services    Frequency       Precautions / Restrictions Precautions Precautions: Shoulder Required Braces or Orthoses:  (Rt sling) Restrictions RUE Weight Bearing: Non weight bearing   Pertinent Vitals/Pain Pt reports 5/10 Rt shoulder pain; pre-medicated and RN informed    ADL  Grooming: Performed;Minimal assistance Where Assessed - Grooming: Unsupported standing Upper Body Dressing: Performed;Minimal assistance Where Assessed - Upper Body Dressing: Unsupported sitting Lower Body Dressing: Simulated;Minimal assistance Where Assessed - Lower Body Dressing: Unsupported sit to stand Toilet Transfer: Performed;Independent Toilet Transfer Method: Sit to Barista: Regular height toilet Toileting - Clothing Manipulation and Hygiene: Simulated;Minimal assistance Where Assessed - Engineer, mining and Hygiene: Standing Tub/Shower Transfer: Landscape architect Method: Architectural technologist Used: Gait belt Transfers/Ambulation Related to ADLs: Pt independent with ambulation ADL Comments: Pt and wife educated on [employer] exercises (external rotation with rod and forward flexion with opposite arm); elbow, wrist and digit A/ROM; sling management, including doffing and  doffing; UB ADL adaptations; and use of LUE with light tasks.     OT Diagnosis:    OT Problem List:   OT Treatment Interventions:     OT Goals    Visit Information  Last OT Received On: 01/13/12 Assistance Needed: +1    Subjective Data  Subjective: I'm ready to get home Patient Stated Goal: Return to work   Prior Functioning  Vision/Perception  Home Living Lives With: Spouse Available Help at Discharge: Family;Available 24 hours/day Type of Home: House Bathroom Shower/Tub: Engineer, manufacturing systems: Standard Home Adaptive Equipment: Shower chair with back Prior Function Level of Independence: Independent Able to Take Stairs?: Reciprically Driving: Yes Vocation: Full time employment Communication Communication: No difficulties Dominant Hand: Left      Cognition  Overall Cognitive Status: Appears within functional limits for tasks assessed/performed Arousal/Alertness: Awake/alert Orientation Level: Appears intact for tasks assessed Behavior During Session: [facility] for tasks performed    Extremity/Trunk Assessment Right Upper Extremity Assessment RUE ROM/Strength/Tone: Unable to fully assess;Due to precautions;Due to pain Left Upper Extremity Assessment LUE ROM/Strength/Tone: Within functional levels LUE Sensation: WFL - Light Touch LUE Coordination: WFL - gross/fine motor   Mobility Bed Mobility Bed Mobility: Supine to Sit;Sit to Supine Supine to Sit: 6: Modified independent (Device/Increase time);With rails Sit to Supine: 6: Modified independent (Device/Increase time);With rail Details for Bed Mobility Assistance: Pt using rail as needed. States his wife can assist if needed at home Transfers Transfers: Sit to Stand;Stand to Sit Sit to Stand: 7: Independent Stand to Sit: 7: Independent   Exercise    Balance    End of Session OT - End of Session Equipment Utilized During Treatment: Gait belt Activity Tolerance: Patient tolerated treatment well Patient  left: in bed;with call bell/phone within reach;with family/visitor present Nurse Communication: Mobility status  GO  Kethan Papadopoulos 01/13/2012, 11:09 AM

## 2012-04-20 ENCOUNTER — Encounter: Payer: Self-pay | Admitting: Gastroenterology

## 2012-07-13 ENCOUNTER — Encounter: Payer: Self-pay | Admitting: Gastroenterology

## 2012-07-18 ENCOUNTER — Encounter: Payer: Self-pay | Admitting: Gastroenterology

## 2012-07-18 ENCOUNTER — Ambulatory Visit (AMBULATORY_SURGERY_CENTER): Payer: 59 | Admitting: *Deleted

## 2012-07-18 VITALS — Ht 72.0 in | Wt 216.0 lb

## 2012-07-18 DIAGNOSIS — Z1211 Encounter for screening for malignant neoplasm of colon: Secondary | ICD-10-CM

## 2012-07-18 MED ORDER — NA SULFATE-K SULFATE-MG SULF 17.5-3.13-1.6 GM/177ML PO SOLN: ORAL | Status: DC

## 2012-07-25 ENCOUNTER — Ambulatory Visit (AMBULATORY_SURGERY_CENTER): Payer: 59 | Admitting: Gastroenterology

## 2012-07-25 ENCOUNTER — Encounter: Payer: Self-pay | Admitting: Gastroenterology

## 2012-07-25 VITALS — BP 123/73 | HR 68 | Temp 97.2°F | Resp 16 | Ht 72.0 in | Wt 216.0 lb

## 2012-07-25 DIAGNOSIS — Z1211 Encounter for screening for malignant neoplasm of colon: Secondary | ICD-10-CM

## 2012-07-25 MED ORDER — SODIUM CHLORIDE 0.9 % IV SOLN: 500.0000 mL | INTRAVENOUS | Status: DC

## 2012-07-25 NOTE — Op Note (Signed)
[facility] [address] [place], [address]   OPERATIVE PROCEDURE REPORT  PATIENT: Bryan Hart, Bryan Hart  MR#: [id number] BIRTHDATE: [birth year] GENDER: Male ENDOSCOPIST: Louis Meckel, MD ASSISTANT: PROCEDURE DATE: 07/25/2012 PRE-PROCEDURE PREPARATION: PRE-PROCEDURE PHYSICAL: PROCEDURE:     Colonoscopy, diagnostic ASA CLASS:     Class II INDICATIONS:     1.  average risk screening. MEDICATIONS:     MAC sedation, administered by CRNA and propofol (Diprivan) 250mg  IV  DESCRIPTION OF PROCEDURE:   After the risks, benefits, and alternatives of the procedure were thoroughly explained [including a 10% missed rate of cancer and polyps], informed consent was obtained.  Digital rectal exam was performed.  The LB CF-H180AL [id number]  was introduced through the anus  and advanced to the cecum, which was identified by both the appendix and ileocecal valve , limited by No adverse events experienced.   The quality of the prep was Suprep excellent . Multiple washes were done. Small lesions could be missed. The instrument was then slowly withdrawn as the colon was fully examined.     COLON FINDINGS: A normal appearing cecum, ileocecal valve, and appendiceal orifice were identified.  The ascending, hepatic flexure, transverse, splenic flexure, descending, sigmoid colon and rectum appeared unremarkable.  No polyps or cancers were seen. The entire colonic mucosa appeared healthy with a normal vascular pattern.  No masses, polyps, diverticula or AVMs were noted.  The appendiceal orifice and the ICV were identified and photographed. The terminal ileum appeared normal.  Retroflexed views revealed no abnormalities.  The patient tolerated the procedure without immediate complications.  The scope was then withdrawn from the patient and the procedure terminated.  TIME TO CECUM:  2 minutes 47 seconds WITHDRAW TIME:  6 minutes 38 seconds  IMPRESSION:     Normal  colon   RECOMMENDATIONS:     Continue current colorectal screening recommendations for "routine risk" patients with a repeat colonoscopy in 10 years.   REPEAT EXAM:          If the patient has any abnormal GI symptoms in the interim, she/he have been advised to contact the office as soon as possible for further recommendations.   CPT CODES:  DIAGNOSIS CODES:  REFERRED XB:JYNWG Chilton Si, M.D.  eSigned:  Louis Meckel, MD 07/25/2012 11:28 AM

## 2012-07-25 NOTE — Progress Notes (Signed)
Patient did not experience any of the following events: a burn prior to discharge; a fall within the facility; wrong site/side/patient/procedure/implant event; or a hospital transfer or hospital admission upon discharge from the facility. (G8907) Patient did not have preoperative order for IV antibiotic SSI prophylaxis. (G8918)  

## 2012-07-25 NOTE — Patient Instructions (Signed)

## 2012-07-26 ENCOUNTER — Telehealth: Payer: Self-pay | Admitting: *Deleted

## 2012-07-26 NOTE — Telephone Encounter (Signed)
  Follow up Call-  Call back number 07/25/2012  Post procedure Call Back phone  # [phone]  Permission to leave phone message Yes     No answer, message left to call if any questions or concerns.

## 2015-02-09 ENCOUNTER — Encounter: Payer: Self-pay | Admitting: Gastroenterology

## 2015-06-30 DIAGNOSIS — H524 Presbyopia: Secondary | ICD-10-CM | POA: Diagnosis not present

## 2015-08-18 DIAGNOSIS — B359 Dermatophytosis, unspecified: Secondary | ICD-10-CM | POA: Diagnosis not present

## 2015-08-18 DIAGNOSIS — I10 Essential (primary) hypertension: Secondary | ICD-10-CM | POA: Diagnosis not present

## 2015-12-21 DIAGNOSIS — I10 Essential (primary) hypertension: Secondary | ICD-10-CM | POA: Diagnosis not present

## 2016-01-19 DIAGNOSIS — M1711 Unilateral primary osteoarthritis, right knee: Secondary | ICD-10-CM | POA: Diagnosis not present

## 2016-05-06 DIAGNOSIS — I10 Essential (primary) hypertension: Secondary | ICD-10-CM | POA: Diagnosis not present

## 2016-05-06 DIAGNOSIS — Z0000 Encounter for general adult medical examination without abnormal findings: Secondary | ICD-10-CM | POA: Diagnosis not present

## 2016-05-06 DIAGNOSIS — K219 Gastro-esophageal reflux disease without esophagitis: Secondary | ICD-10-CM | POA: Diagnosis not present

## 2016-05-06 DIAGNOSIS — Z23 Encounter for immunization: Secondary | ICD-10-CM | POA: Diagnosis not present

## 2016-05-09 DIAGNOSIS — H2513 Age-related nuclear cataract, bilateral: Secondary | ICD-10-CM | POA: Diagnosis not present

## 2016-05-09 DIAGNOSIS — H43813 Vitreous degeneration, bilateral: Secondary | ICD-10-CM | POA: Diagnosis not present

## 2016-08-11 DIAGNOSIS — J208 Acute bronchitis due to other specified organisms: Secondary | ICD-10-CM | POA: Diagnosis not present

## 2016-09-08 DIAGNOSIS — H524 Presbyopia: Secondary | ICD-10-CM | POA: Diagnosis not present

## 2016-09-08 DIAGNOSIS — H2513 Age-related nuclear cataract, bilateral: Secondary | ICD-10-CM | POA: Diagnosis not present

## 2016-10-19 DIAGNOSIS — L814 Other melanin hyperpigmentation: Secondary | ICD-10-CM | POA: Diagnosis not present

## 2016-10-19 DIAGNOSIS — L570 Actinic keratosis: Secondary | ICD-10-CM | POA: Diagnosis not present

## 2016-10-19 DIAGNOSIS — L821 Other seborrheic keratosis: Secondary | ICD-10-CM | POA: Diagnosis not present

## 2016-10-19 DIAGNOSIS — D224 Melanocytic nevi of scalp and neck: Secondary | ICD-10-CM | POA: Diagnosis not present

## 2016-11-01 DIAGNOSIS — K219 Gastro-esophageal reflux disease without esophagitis: Secondary | ICD-10-CM | POA: Diagnosis not present

## 2016-11-01 DIAGNOSIS — G4730 Sleep apnea, unspecified: Secondary | ICD-10-CM | POA: Diagnosis not present

## 2016-11-01 DIAGNOSIS — I10 Essential (primary) hypertension: Secondary | ICD-10-CM | POA: Diagnosis not present

## 2017-03-27 DIAGNOSIS — Z23 Encounter for immunization: Secondary | ICD-10-CM | POA: Diagnosis not present

## 2017-05-30 DIAGNOSIS — I10 Essential (primary) hypertension: Secondary | ICD-10-CM | POA: Diagnosis not present

## 2017-05-30 DIAGNOSIS — I493 Ventricular premature depolarization: Secondary | ICD-10-CM | POA: Diagnosis not present

## 2017-06-30 DIAGNOSIS — Z125 Encounter for screening for malignant neoplasm of prostate: Secondary | ICD-10-CM | POA: Diagnosis not present

## 2017-06-30 DIAGNOSIS — E7800 Pure hypercholesterolemia, unspecified: Secondary | ICD-10-CM | POA: Diagnosis not present

## 2017-06-30 DIAGNOSIS — I10 Essential (primary) hypertension: Secondary | ICD-10-CM | POA: Diagnosis not present

## 2017-07-07 DIAGNOSIS — M25511 Pain in right shoulder: Secondary | ICD-10-CM | POA: Diagnosis not present

## 2017-07-07 DIAGNOSIS — Z96611 Presence of right artificial shoulder joint: Secondary | ICD-10-CM | POA: Diagnosis not present

## 2017-07-07 DIAGNOSIS — M67912 Unspecified disorder of synovium and tendon, left shoulder: Secondary | ICD-10-CM | POA: Diagnosis not present

## 2017-07-07 DIAGNOSIS — Z09 Encounter for follow-up examination after completed treatment for conditions other than malignant neoplasm: Secondary | ICD-10-CM | POA: Diagnosis not present

## 2017-08-04 DIAGNOSIS — M67912 Unspecified disorder of synovium and tendon, left shoulder: Secondary | ICD-10-CM | POA: Diagnosis not present

## 2017-09-25 ENCOUNTER — Ambulatory Visit (INDEPENDENT_AMBULATORY_CARE_PROVIDER_SITE_OTHER): Payer: PPO

## 2017-09-25 ENCOUNTER — Encounter (INDEPENDENT_AMBULATORY_CARE_PROVIDER_SITE_OTHER): Payer: Self-pay | Admitting: Orthopaedic Surgery

## 2017-09-25 ENCOUNTER — Ambulatory Visit (INDEPENDENT_AMBULATORY_CARE_PROVIDER_SITE_OTHER): Payer: PPO | Admitting: Orthopaedic Surgery

## 2017-09-25 VITALS — BP 127/79 | HR 73 | Resp 16 | Ht 72.0 in | Wt 198.0 lb

## 2017-09-25 DIAGNOSIS — M25561 Pain in right knee: Secondary | ICD-10-CM

## 2017-09-25 MED ORDER — BUPIVACAINE HCL 0.5 % IJ SOLN
2.0000 mL | INTRAMUSCULAR | Status: AC | PRN
  Administered 2017-09-25: 2 mL via INTRA_ARTICULAR

## 2017-09-25 MED ORDER — METHYLPREDNISOLONE ACETATE 40 MG/ML IJ SUSP
80.0000 mg | INTRAMUSCULAR | Status: AC | PRN
Start: 2017-09-25 — End: 2017-09-25
  Administered 2017-09-25: 80 mg

## 2017-09-25 MED ORDER — LIDOCAINE HCL 1 % IJ SOLN
2.0000 mL | INTRAMUSCULAR | Status: AC | PRN
  Administered 2017-09-25: 2 mL

## 2017-09-25 NOTE — Progress Notes (Signed)
Office Visit Note   Patient: Bryan Hart           Date of Birth: 11/08/1950           MRN: [id number] Visit Date: 09/25/2017              Requested by: Levin Erp, MD [place], [place] [address] PCP: Levin Erp, MD   Assessment & Plan: Visit Diagnoses:  1. Acute pain of right knee     Plan: Acute exacerbation of chronic right knee pain.  Has end-stage osteoarthritis.  We will try cortisone injection, pullover knee support and NSAIDs.  Monitor response.  Long discussion regarding different treatment options including knee replacement. Follow-Up Instructions: No follow-ups on file.   Orders:  Orders Placed This Encounter  Procedures  . Large Joint Inj: R knee  . XR KNEE 3 VIEW RIGHT   No orders of the defined types were placed in this encounter.     Procedures: Large Joint Inj: R knee on 09/25/2017 10:09 AM Indications: pain and diagnostic evaluation Details: 25 G 1.5 in needle, anteromedial approach  Arthrogram: No  Medications: 2 mL lidocaine 1 %; 2 mL bupivacaine 0.5 %; 80 mg methylPREDNISolone acetate 40 MG/ML Aspirate: 1 mL clear Outcome: tolerated well, no immediate complications Procedure, treatment alternatives, risks and benefits explained, specific risks discussed. Consent was given by the patient. Immediately prior to procedure a time out was called to verify the correct patient, procedure, equipment, support staff and site/side marked as required. Patient was prepped and draped in the usual sterile fashion.       Clinical Data: No additional findings.   Subjective: Chief Complaint  Patient presents with  . Right Knee - Pain    R KNEE PAIN 1 WEEK, NOTICED AFTER WORKING IN THE YARD  . Follow-up    R KNEE PAIN 1 WEEK, NOTICED AFTER WORKING IN THE YARD  Bryan Hart had a recent onset of right knee pain after working in  the yard.  He has had some chronic discomfort in his knee dating back many many years.  He oftentimes finds  that he has to walk 10 or 15 steps before his knee feels "comfortable".  He has not had any trouble sleeping.  No recent injury or trauma.  No sensation of his knee giving way.  No distal edema.  No calf pain.  No back pain.  No thigh or hip discomfort.  HPI  Review of Systems  Constitutional: Negative for fatigue and fever.  HENT: Negative for ear pain.   Eyes: Negative for pain.  Respiratory: Negative for cough and shortness of breath.   Cardiovascular: Positive for leg swelling.  Gastrointestinal: Negative for blood in stool, constipation and diarrhea.  Genitourinary: Negative for difficulty urinating.  Musculoskeletal: Negative for back pain and neck pain.  Skin: Negative for rash and wound.  Allergic/Immunologic: Negative for food allergies.  Neurological: Positive for weakness. Negative for dizziness, light-headedness, numbness and headaches.  Psychiatric/Behavioral: Positive for sleep disturbance.     Objective: Vital Signs: BP 127/79 (BP Location: Left Arm, Patient Position: Sitting, Cuff Size: Normal)   Pulse 73   Resp 16   Ht 6' (1.829 m)   Wt 198 lb (89.8 kg)   BMI 26.85 kg/m   Physical Exam  Ortho Exam awake alert and oriented x3.  Comfortable sitting.  Straight leg raise negative.  Painless range of motion both hips.  Small effusion right knee.  Lacks about full knee  extension.  Flexes 105 degrees.  Increased varus with weightbearing.  Pain predominantly on the medial compartment but does have some crepitation with patella motion.  No popliteal discomfort or mass.  No calf pain.  Neurovascular exam intact  Specialty Comments:  No specialty comments available.  Imaging: Xr Knee 3 View Right  Result Date: 09/25/2017 Films of the right knee demonstrate end-stage osteoarthritis in all 3 compartments.  There is approximately 4 degrees of varus.  Considerable narrowing of the medial compartment with irregularity along the joint surface and large peripheral osteophytes.   Similar findings in the lateral compartment to a slight lesser degree at the patellofemoral joint.  Acute changes    PMFS History: Patient Active Problem List   Diagnosis Date Noted  . Primary localized osteoarthrosis, shoulder region 01/13/2012   Past Medical History:  Diagnosis Date  . Hypertension    dr  ed green      stress test   12 yrs ago  . PVC's (premature ventricular contractions) 12 years ago   stress test done  . Sleep apnea     Family History  Problem Relation Age of Onset  . Colon cancer Neg Hx     Past Surgical History:  Procedure Laterality Date  . ANKLE ARTHROSCOPY  2010  . JOINT REPLACEMENT  2011   rt shoulder rotator cuff repair  . knee surgeries  [id number]   x2  . SHOULDER HEMI-ARTHROPLASTY  01/12/2012   Procedure: SHOULDER HEMI-ARTHROPLASTY;  Surgeon: Nita Sells, MD;  Location: [facility];  Service: Orthopedics;  Laterality: Right;   Social History   Occupational History  . Not on file  Tobacco Use  . Smoking status: Never Smoker  . Smokeless tobacco: Never Used  Substance and Sexual Activity  . Alcohol use: Yes    Alcohol/week: 4.8 oz    Types: 8 Cans of beer per week    Comment: weekly  and weekends  . Drug use: No  . Sexual activity: Not on file

## 2017-12-10 DIAGNOSIS — B029 Zoster without complications: Secondary | ICD-10-CM | POA: Diagnosis not present

## 2017-12-11 DIAGNOSIS — L509 Urticaria, unspecified: Secondary | ICD-10-CM | POA: Diagnosis not present

## 2018-02-09 ENCOUNTER — Encounter (INDEPENDENT_AMBULATORY_CARE_PROVIDER_SITE_OTHER): Payer: Self-pay | Admitting: Orthopaedic Surgery

## 2018-02-09 ENCOUNTER — Telehealth: Payer: Self-pay | Admitting: *Deleted

## 2018-02-09 ENCOUNTER — Encounter (INDEPENDENT_AMBULATORY_CARE_PROVIDER_SITE_OTHER): Payer: Self-pay

## 2018-02-09 ENCOUNTER — Ambulatory Visit (INDEPENDENT_AMBULATORY_CARE_PROVIDER_SITE_OTHER): Payer: PPO | Admitting: Orthopaedic Surgery

## 2018-02-09 VITALS — BP 129/72 | HR 72 | Ht 72.0 in | Wt 196.0 lb

## 2018-02-09 DIAGNOSIS — M1711 Unilateral primary osteoarthritis, right knee: Secondary | ICD-10-CM | POA: Diagnosis not present

## 2018-02-09 MED ORDER — METHYLPREDNISOLONE ACETATE 40 MG/ML IJ SUSP
80.0000 mg | INTRAMUSCULAR | Status: AC | PRN
  Administered 2018-02-09: 80 mg

## 2018-02-09 MED ORDER — LIDOCAINE HCL 1 % IJ SOLN
2.0000 mL | INTRAMUSCULAR | Status: AC | PRN
  Administered 2018-02-09: 2 mL

## 2018-02-09 MED ORDER — BUPIVACAINE HCL 0.5 % IJ SOLN
2.0000 mL | INTRAMUSCULAR | Status: AC | PRN
  Administered 2018-02-09: 2 mL via INTRA_ARTICULAR

## 2018-02-09 NOTE — Telephone Encounter (Signed)
Please apply for Euflexxa/Hyalgan for right knee for Dr Durward Fortes patient. Thank you.

## 2018-02-09 NOTE — Progress Notes (Signed)
Office Visit Note   Patient: Bryan Hart           Date of Birth: [birth year]           MRN: [id number] Visit Date: 02/09/2018              Requested by: Levin Erp, MD [place] [place], [place] [address] PCP: Levin Erp, MD   Assessment & Plan: Visit Diagnoses:  1. Unilateral primary osteoarthritis, right knee     Plan: Passive, advanced, end-stage osteoarthritis right knee.  Long discussion over half an hour regarding diagnosis and treatment options.  Aspirated the knee of approximately 42 cc of clear fluid and injected cortisone.  We will pre-CERT Visco supplementation.  Not ready for knee replacement  Follow-Up Instructions: Return if symptoms worsen or fail to improve.   Orders:  Orders Placed This Encounter  Procedures  . Large Joint Inj: R knee   No orders of the defined types were placed in this encounter.     Procedures: Large Joint Inj: R knee on 02/09/2018 12:20 PM Indications: pain and diagnostic evaluation Details: 25 G 1.5 in needle, anteromedial approach  Arthrogram: No  Medications: 2 mL lidocaine 1 %; 2 mL bupivacaine 0.5 %; 80 mg methylPREDNISolone acetate 40 MG/ML Procedure, treatment alternatives, risks and benefits explained, specific risks discussed. Consent was given by the patient. Immediately prior to procedure a time out was called to verify the correct patient, procedure, equipment, support staff and site/side marked as required. Patient was prepped and draped in the usual sterile fashion.    I also aspirated 42 cc of clear yellow joint fluid   Clinical Data: No additional findings.   Subjective: Chief Complaint  Patient presents with  . Follow-up    R KNEE LUMP ON LATERAL SIDE OF KNEE FOR 3-4 WEEKS, SWELLS UP AFTER WORKING OUT OR WALKING FOR 53 MINS AT A TIME  Bryan Hart has been followed for many years regarding the osteoarthritis of his right knee.  He had several cortisone injections in 2017 and again in April of  this year.  X-rays in April revealed basically end-stage osteoarthritis in all 3 compartments with increased varus and nearly bone-on-bone in the medial compartment.  Appears to be some component of chondrocalcinosis.  He does have  activity compromise particularly when he is on his feet.  Not having any trouble sleeping.  He realizes his knee is enlarged occasionally has a sensation of his knee giving way.  HPI  Review of Systems  Constitutional: Negative for fatigue and fever.  HENT: Negative for ear pain.   Eyes: Negative for pain.  Respiratory: Negative for cough and shortness of breath.   Cardiovascular: Positive for leg swelling.  Gastrointestinal: Negative for constipation and diarrhea.  Genitourinary: Negative for difficulty urinating.  Musculoskeletal: Negative for back pain and neck pain.  Skin: Negative for rash.  Allergic/Immunologic: Negative for food allergies.  Neurological: Positive for weakness. Negative for numbness.  Hematological: Does not bruise/bleed easily.  Psychiatric/Behavioral: Negative for sleep disturbance.     Objective: Vital Signs: BP 129/72 (BP Location: Left Arm, Patient Position: Sitting, Cuff Size: Normal)   Pulse 72   Ht 6' (1.829 m)   Wt 196 lb (88.9 kg)   BMI 26.58 kg/m   Physical Exam  Constitutional: He is oriented to person, place, and time. He appears well-developed and well-nourished.  HENT:  Mouth/Throat: Oropharynx is clear and moist.  Eyes: Pupils are equal, round, and reactive to light.  EOM are normal.  Pulmonary/Chest: Effort normal.  Neurological: He is alert and oriented to person, place, and time.  Skin: Skin is warm and dry.  Psychiatric: He has a normal mood and affect. His behavior is normal.    Ortho Exam awake and alert and oriented x3.  Comfortable sitting.  Lacks about 7 to 8 degrees to full right knee extension.  Popliteal fullness consistent with popliteal cyst.  Some cyst even along the lateral compartment.   Increased varus.  Predominant medial joint pain.  Some patellar crepitation.  Mild right ankle swelling with some venous stasis changes.  Good pulses.  Normal sensibility.  Painless range of motion both hips  Specialty Comments:  No specialty comments available.  Imaging: No results found.   PMFS History: Patient Active Problem List   Diagnosis Date Noted  . Primary localized osteoarthrosis, shoulder region 01/13/2012   Past Medical History:  Diagnosis Date  . Hypertension    dr  ed green      stress test   12 yrs ago  . PVC's (premature ventricular contractions) 12 years ago   stress test done  . Sleep apnea     Family History  Problem Relation Age of Onset  . Colon cancer Neg Hx     Past Surgical History:  Procedure Laterality Date  . ANKLE ARTHROSCOPY  2010  . JOINT REPLACEMENT  2011   rt shoulder rotator cuff repair  . knee surgeries  [id number]   x2  . SHOULDER HEMI-ARTHROPLASTY  01/12/2012   Procedure: SHOULDER HEMI-ARTHROPLASTY;  Surgeon: Nita Sells, MD;  Location: [facility];  Service: Orthopedics;  Laterality: Right;   Social History   Occupational History  . Not on file  Tobacco Use  . Smoking status: Former Smoker    Last attempt to quit: 1976    Years since quitting: 43.6  . Smokeless tobacco: Never Used  Substance and Sexual Activity  . Alcohol use: Yes    Alcohol/week: 8.0 standard drinks    Types: 8 Cans of beer per week    Comment: weekly  and weekends  . Drug use: No  . Sexual activity: Not on file

## 2018-02-16 ENCOUNTER — Telehealth (INDEPENDENT_AMBULATORY_CARE_PROVIDER_SITE_OTHER): Payer: Self-pay

## 2018-02-16 NOTE — Telephone Encounter (Signed)
Submitted VOB for Euflexxa, right knee. 

## 2018-02-16 NOTE — Telephone Encounter (Signed)
Noted  

## 2018-02-22 ENCOUNTER — Other Ambulatory Visit: Payer: Self-pay

## 2018-02-22 DIAGNOSIS — R531 Weakness: Secondary | ICD-10-CM | POA: Diagnosis not present

## 2018-02-22 NOTE — Patient Outreach (Signed)
[place] [facility]) Care Management  02/22/2018  HARKIRAT OROZCO [birth year] [id number]   Telephone Screen  Referral Date: 02/22/18 Referral Source: Nurse Call Center Referral Reason: " caller states he doesn't know what's going on, his medication is messing with his dexterity, Parafon forte muscle relaxer started Monday evening foe back spasms. He reached out to MD office and they thought it was related to his muscle relaxer, he was triaged, advised to not take it anymore by his MD" Insurance: HTA   Outreach attempt # 1 to patient. No answer at present. RN CM left HIPAA compliant voicemail message along with contact info.   Plan: RN CM will make outreach attempt to patient within 3-4 business days. RN CM will send unsuccessful outreach letter to patient.  Enzo Montgomery, RN,BSN,CCM [place] Management Telephonic Care Management Coordinator Direct Phone: [phone] Toll Free: [phone] Fax: [phone]

## 2018-02-23 ENCOUNTER — Ambulatory Visit (HOSPITAL_COMMUNITY)
Admission: RE | Admit: 2018-02-23 | Discharge: 2018-02-23 | Disposition: A | Discharge status: Home / Self Care | Payer: PPO | Source: Ambulatory Visit | Attending: Internal Medicine | Admitting: Internal Medicine

## 2018-02-23 ENCOUNTER — Encounter (HOSPITAL_COMMUNITY): Payer: Self-pay

## 2018-02-23 ENCOUNTER — Other Ambulatory Visit: Payer: Self-pay

## 2018-02-23 ENCOUNTER — Other Ambulatory Visit: Payer: Self-pay | Admitting: Internal Medicine

## 2018-02-23 ENCOUNTER — Other Ambulatory Visit (HOSPITAL_COMMUNITY): Payer: Self-pay | Admitting: Internal Medicine

## 2018-02-23 DIAGNOSIS — I639 Cerebral infarction, unspecified: Secondary | ICD-10-CM

## 2018-02-23 DIAGNOSIS — R29898 Other symptoms and signs involving the musculoskeletal system: Secondary | ICD-10-CM

## 2018-02-23 NOTE — Patient Outreach (Signed)
[place] [facility]) Care Management  02/23/2018  Bryan Hart 1951-02-23 [id number]    Telephone Screen  Referral Date: 02/22/18 Referral Source: Nurse Call Center Referral Reason: " caller states he doesn't know what's going on, his medication is messing with his dexterity, Parafon forte muscle relaxer started Monday evening foe back spasms. He reached out to MD office and they thought it was related to his muscle relaxer, he was triaged, advised to not take it anymore by his MD" Insurance: HTA    Outreach attempt #2 to patient. No answer at present.     Plan: RN CM will make outreach attempt to patient within 3-4 business days.  Bryan Montgomery, RN,BSN,CCM [place] Management Telephonic Care Management Coordinator Direct Phone: [phone] Toll Free: [phone] Fax: [phone]

## 2018-02-24 ENCOUNTER — Ambulatory Visit (HOSPITAL_COMMUNITY)
Admission: RE | Admit: 2018-02-24 | Discharge: 2018-02-24 | Disposition: A | Discharge status: Home / Self Care | Payer: PPO | Source: Ambulatory Visit | Attending: Internal Medicine | Admitting: Internal Medicine

## 2018-02-24 DIAGNOSIS — I10 Essential (primary) hypertension: Secondary | ICD-10-CM | POA: Diagnosis not present

## 2018-02-24 DIAGNOSIS — I6389 Other cerebral infarction: Secondary | ICD-10-CM | POA: Diagnosis not present

## 2018-02-24 DIAGNOSIS — I639 Cerebral infarction, unspecified: Secondary | ICD-10-CM | POA: Diagnosis not present

## 2018-02-24 LAB — CREATININE, SERUM
CREATININE: 1.29 mg/dL — AB (ref 0.61–1.24)
GFR calc non Af Amer: 55 mL/min — ABNORMAL LOW (ref >60)

## 2018-02-24 MED ORDER — GADOBENATE DIMEGLUMINE 529 MG/ML IV SOLN
20.0000 mL | Freq: Once | INTRAVENOUS | Status: AC
  Administered 2018-02-24: 20 mL via INTRAVENOUS

## 2018-02-27 ENCOUNTER — Other Ambulatory Visit (HOSPITAL_COMMUNITY): Payer: Self-pay | Admitting: Internal Medicine

## 2018-02-27 ENCOUNTER — Other Ambulatory Visit: Payer: Self-pay

## 2018-02-27 DIAGNOSIS — I639 Cerebral infarction, unspecified: Secondary | ICD-10-CM

## 2018-02-27 NOTE — Patient Outreach (Signed)
[place] [facility]) Care Management  02/27/2018  OSWIN GRIFFITH 1951/06/05 [id number]   Telephone Screen  Referral Date:02/22/18 Referral Source:Nurse Call Center Referral Reason:" caller states he doesn't know what's going on, his medication is messing with his dexterity, Parafon forte muscle relaxer started Monday evening foe back spasms. He reached out to MD office and they thought it was related to his muscle relaxer, he was triaged, advised to not take it anymore by his MD" Insurance:HTA   Outreach attempt #3 to patient. No answer at present.     Plan: RN CM will close case if no response from letter mailed to patient.   Enzo Montgomery, RN,BSN,CCM [place] Management Telephonic Care Management Coordinator Direct Phone: [phone] Toll Free: [phone] Fax: [phone]

## 2018-03-05 ENCOUNTER — Ambulatory Visit (HOSPITAL_BASED_OUTPATIENT_CLINIC_OR_DEPARTMENT_OTHER)
Admission: RE | Admit: 2018-03-05 | Discharge: 2018-03-05 | Disposition: A | Discharge status: Home / Self Care | Payer: PPO | Source: Ambulatory Visit | Attending: Internal Medicine | Admitting: Internal Medicine

## 2018-03-05 ENCOUNTER — Telehealth (INDEPENDENT_AMBULATORY_CARE_PROVIDER_SITE_OTHER): Payer: Self-pay

## 2018-03-05 ENCOUNTER — Ambulatory Visit (HOSPITAL_COMMUNITY)
Admission: RE | Admit: 2018-03-05 | Discharge: 2018-03-05 | Disposition: A | Discharge status: Home / Self Care | Payer: PPO | Source: Ambulatory Visit | Attending: Cardiology | Admitting: Cardiology

## 2018-03-05 DIAGNOSIS — J90 Pleural effusion, not elsewhere classified: Secondary | ICD-10-CM | POA: Insufficient documentation

## 2018-03-05 DIAGNOSIS — I639 Cerebral infarction, unspecified: Secondary | ICD-10-CM | POA: Insufficient documentation

## 2018-03-05 DIAGNOSIS — I77810 Thoracic aortic ectasia: Secondary | ICD-10-CM | POA: Insufficient documentation

## 2018-03-05 DIAGNOSIS — I6523 Occlusion and stenosis of bilateral carotid arteries: Secondary | ICD-10-CM | POA: Diagnosis not present

## 2018-03-05 DIAGNOSIS — G4730 Sleep apnea, unspecified: Secondary | ICD-10-CM | POA: Insufficient documentation

## 2018-03-05 DIAGNOSIS — I10 Essential (primary) hypertension: Secondary | ICD-10-CM | POA: Diagnosis not present

## 2018-03-05 DIAGNOSIS — I272 Pulmonary hypertension, unspecified: Secondary | ICD-10-CM | POA: Diagnosis not present

## 2018-03-05 DIAGNOSIS — I371 Nonrheumatic pulmonary valve insufficiency: Secondary | ICD-10-CM | POA: Insufficient documentation

## 2018-03-05 NOTE — Progress Notes (Signed)
  Echocardiogram 2D Echocardiogram has been performed.  Bryan Hart G Adalis Gatti 03/05/2018, 4:00 PM

## 2018-03-05 NOTE — Telephone Encounter (Signed)
PA required for Euflexxa series, right knee. Completed PA form faxed to [employer] at [phone].

## 2018-03-05 NOTE — Progress Notes (Signed)
Carotid duplex prelim: 1-39% ICA stenosis.  Kiel Cockerell Eunice, RDMS, RVT   

## 2018-03-07 ENCOUNTER — Other Ambulatory Visit: Payer: Self-pay

## 2018-03-07 ENCOUNTER — Telehealth (INDEPENDENT_AMBULATORY_CARE_PROVIDER_SITE_OTHER): Payer: Self-pay

## 2018-03-07 NOTE — Patient Outreach (Signed)
[facility]) Care Management  03/07/2018  Bryan Hart Sep 08, 1950 [id number]   Telephone Screen  Referral Date:02/22/18 Referral Source:Nurse Call Center Referral Reason:" caller states he doesn't know what's going on, his medication is messing with his dexterity, Parafon forte muscle relaxer started Monday evening foe back spasms. He reached out to MD office and they thought it was related to his muscle relaxer, he was triaged, advised to not take it anymore by his MD" Insurance:HTA     Multiple attempts to establish contact with patient without success. No response from letter mailed to patient. Case is being closed at this time.     Plan: RN CM will close case at this time.  Enzo Montgomery, RN,BSN,CCM [place] Management Telephonic Care Management Coordinator Direct Phone: [phone] Toll Free: [phone] Fax: [phone]

## 2018-03-07 NOTE — Telephone Encounter (Signed)
Please schedule patient an appointment with Dr. Durward Fortes for gel injection.  Patient is approved for Euflexxa series,right knee. [place] Patient will be responsible for 20% OOP. Co-pay $20.00 PA Approval# [id number] Valid 03/06/2018 - 06/04/2018  Thank You.

## 2018-03-12 ENCOUNTER — Ambulatory Visit (INDEPENDENT_AMBULATORY_CARE_PROVIDER_SITE_OTHER): Payer: PPO | Admitting: Diagnostic Neuroimaging

## 2018-03-12 ENCOUNTER — Encounter: Payer: Self-pay | Admitting: Diagnostic Neuroimaging

## 2018-03-12 VITALS — BP 120/77 | HR 73 | Ht 72.0 in | Wt 200.2 lb

## 2018-03-12 DIAGNOSIS — I639 Cerebral infarction, unspecified: Secondary | ICD-10-CM | POA: Diagnosis not present

## 2018-03-12 NOTE — Patient Instructions (Signed)
  STROKE PREVENTION - continue aspirin, verapamil - check lipid panel and start statin (per PCP) - consider repeat MRI brain w/wo in 6-12 months - encouraged exercises of right hand  SLEEP APNEA - refer to sleep clinic (last study at [place] > 6 years ago)

## 2018-03-12 NOTE — Progress Notes (Signed)
[facility]  PATIENT: Bryan Hart DOB: 1951/05/25  REFERRING CLINICIAN: E green HISTORY FROM: patient  REASON FOR VISIT: new consult    HISTORICAL  CHIEF COMPLAINT:  Chief Complaint  Patient presents with  . New Patient (Initial Visit)    Rm 7, alone  . New Stroke    Dr. Levin Erp.  Right 2 fingers numb/ tingling.  Could not play piano or type.  Better now.  Had MRI    HISTORY OF PRESENT ILLNESS:   67 year old left-handed male here for evaluation of stroke.  History of hypertension and sleep apnea.  Approximately 3 weeks of patient noticed mild numbness in his right hand, digits 3 and 4, with some clumsiness of the right hand.  He had recently started a muscle relaxer and thought this may be a side effect.  Few days later patient went into PCP for evaluation, who recognized possible stroke symptoms and ordered MRI of the brain.  MRI of the brain which confirmed acute to subacute ischemic infarction of the left precentral gyrus, correlating to patient's symptoms.  Echocardiogram and carotid ultrasound were obtained which were unremarkable.  Patient was started on aspirin 81 mg/day and referred here for further follow-up.  Patient denies any numbness or weakness in the right face and right leg.  No headaches.  No recent injuries or traumas.  Symptoms are gradually improving.  Patient was diagnosed with mild to moderate sleep apnea 6-7 years ago, but could not tolerate CPAP mask.  He did not have any other further follow-up.   REVIEW OF SYSTEMS: Full 14 system review of systems performed and negative with exception of: Not enough sleep PVCs.  ALLERGIES: Allergies  Allergen Reactions  . Adhesive [Tape] Rash    HOME MEDICATIONS: Outpatient Medications Prior to Visit  Medication Sig Dispense Refill  . aspirin EC 81 MG tablet Take 81 mg by mouth daily.    . Multiple Vitamin (MULTIVITAMIN) tablet Take 1 tablet by mouth daily.    Marland Kitchen OVER THE COUNTER  MEDICATION Take 1 capsule by mouth daily. Glucosamine-Chondroitin 1500-1200    . verapamil (CALAN-SR) 240 MG CR tablet Take 240 mg by mouth daily.     No facility-administered medications prior to visit.     PAST MEDICAL HISTORY: Past Medical History:  Diagnosis Date  . Hypertension    dr  ed green      stress test   12 yrs ago  . PVC's (premature ventricular contractions) 12 years ago   stress test done  . Sleep apnea     PAST SURGICAL HISTORY: Past Surgical History:  Procedure Laterality Date  . ANKLE ARTHROSCOPY  2010  . JOINT REPLACEMENT  2011   rt shoulder rotator cuff repair  . knee surgeries  [id number]   x2  . SHOULDER HEMI-ARTHROPLASTY  01/12/2012   Procedure: SHOULDER HEMI-ARTHROPLASTY;  Surgeon: Nita Sells, MD;  Location: [facility];  Service: Orthopedics;  Laterality: Right;    FAMILY HISTORY: Family History  Problem Relation Age of Onset  . Heart disease Father   . Hypertension Father   . Heart attack Brother   . Hypertension Brother   . Stroke Paternal Grandmother   . Heart attack Maternal Uncle   . Colon cancer Neg Hx     SOCIAL HISTORY: Social History   Socioeconomic History  . Marital status: Married    Spouse name: Not on file  . Number of children: Not on file  . Years of education: Not on file  .  Highest education level: Not on file  Occupational History  . Not on file  Social Needs  . Financial resource strain: Not on file  . Food insecurity:    Worry: Not on file    Inability: Not on file  . Transportation needs:    Medical: Not on file    Non-medical: Not on file  Tobacco Use  . Smoking status: Former Smoker    Last attempt to quit: 1975    Years since quitting: 44.7  . Smokeless tobacco: Never Used  Substance and Sexual Activity  . Alcohol use: Yes    Alcohol/week: 1.0 - 2.0 standard drinks    Types: 1 - 2 Cans of beer per week    Comment: daily  . Drug use: No  . Sexual activity: Not on file  Lifestyle  . Physical  activity:    Days per week: Not on file    Minutes per session: Not on file  . Stress: Not on file  Relationships  . Social connections:    Talks on phone: Not on file    Gets together: Not on file    Attends religious service: Not on file    Active member of club or organization: Not on file    Attends meetings of clubs or organizations: Not on file    Relationship status: Not on file  . Intimate partner violence:    Fear of current or ex partner: Not on file    Emotionally abused: Not on file    Physically abused: Not on file    Forced sexual activity: Not on file  Other Topics Concern  . Not on file  Social History Narrative   Lives home with spouse [name].   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     PHYSICAL EXAM  GENERAL EXAM/CONSTITUTIONAL: Vitals:  Vitals:   03/12/18 1034  BP: 120/77  Pulse: 73  Weight: 200 lb 3.2 oz (90.8 kg)  Height: 6' (1.829 m)     Body mass index is 27.15 kg/m. Wt Readings from Last 3 Encounters:  03/12/18 200 lb 3.2 oz (90.8 kg)  02/09/18 196 lb (88.9 kg)  09/25/17 198 lb (89.8 kg)     Patient is in no distress; well developed, nourished and groomed; neck is supple  CARDIOVASCULAR:  Examination of carotid arteries is normal; no carotid bruits  Regular rate and rhythm, no murmurs  Examination of peripheral vascular system by observation and palpation is normal  EYES:  Ophthalmoscopic exam of optic discs and posterior segments is normal; no papilledema or hemorrhages  No exam data present  MUSCULOSKELETAL:  Gait, strength, tone, movements noted in Neurologic exam below  NEUROLOGIC: MENTAL STATUS:  No flowsheet data found.  awake, alert, oriented to person, place and time  recent and remote memory intact  normal attention and concentration  language fluent, comprehension intact, naming intact  fund of knowledge appropriate  CRANIAL NERVE:   2nd - no papilledema on fundoscopic exam  2nd, 3rd, 4th, 6th -  pupils equal and reactive to light, visual fields full to confrontation, extraocular muscles intact, no nystagmus  5th - facial sensation symmetric  7th - facial strength symmetric  8th - hearing intact  9th - palate elevates symmetrically, uvula midline  11th - shoulder shrug symmetric  12th - tongue protrusion midline  MOTOR:   normal bulk and tone, full strength in the BUE, BLE; LEFT ARM SLIGHTLY ORBITS AROUND RIGHT  RIGHT KNEE BRACE  SENSORY:   normal and  symmetric to light touch, pinprick, temperature, vibration  COORDINATION:   finger-nose-finger, fine finger movements normal  REFLEXES:   deep tendon reflexes present and symmetric  GAIT/STATION:   narrow based gait     DIAGNOSTIC DATA (LABS, IMAGING, TESTING) - I reviewed patient records, labs, notes, testing and imaging myself where available.  Lab Results  Component Value Date   WBC 6.6 01/13/2012   HGB 11.5 (L) 01/13/2012   HCT 35.1 (L) 01/13/2012   MCV 91.4 01/13/2012   PLT 207 01/13/2012      Component Value Date/Time   NA 136 01/13/2012 0505   K 4.1 01/13/2012 0505   CL 103 01/13/2012 0505   CO2 25 01/13/2012 0505   GLUCOSE 104 (H) 01/13/2012 0505   BUN 10 01/13/2012 0505   CREATININE 1.29 (H) 02/24/2018 1230   CALCIUM 8.4 01/13/2012 0505   PROT 7.2 01/12/2012 0634   ALBUMIN 4.1 01/12/2012 0634   AST 23 01/12/2012 0634   ALT 20 01/12/2012 0634   ALKPHOS 87 01/12/2012 0634   BILITOT 0.3 01/12/2012 0634   GFRNONAA 55 (L) 02/24/2018 1230   GFRAA >60 02/24/2018 1230   No results found for: CHOL, HDL, LDLCALC, LDLDIRECT, TRIG, CHOLHDL No results found for: HGBA1C No results found for: VITAMINB12 No results found for: TSH   03/05/18 TTE  - Normal LV size and systolic function, EF 23-95%. Normal RV size   and systolic function. Borderline pulmonary hypertension.  03/05/18 carotid u/s Right Carotid: Velocities in the right ICA are consistent with a 1-39% stenosis. Left Carotid:  Velocities in the left ICA are consistent with a 1-39% stenosis. Vertebrals: Bilateral vertebral arteries demonstrate antegrade flow. Subclavians: Normal flow hemodynamics were seen in bilateral subclavian arteries.  02/24/18 MRI brain (with and without) [I reviewed images myself and agree with interpretation. -VRP]  1. 1 cm acute infarct on the left motor strip at the hand knob. 2. 6 mm enhancing nodule medial to the right V4 segment. A schwannoma or benign enhancing foramen magnum lesion is favored. Further discussion above. Recommend follow-up brain MRI to ensure stability. 3. Small remote right frontal cortex infarct.   ASSESSMENT AND PLAN  67 y.o. year old male here with HTN, sleep apnea, here for stroke evaluation.   Ddx: left frontal stroke (artery-artery embolism vs small vessel thrombosis)  1. Ischemic stroke of frontal lobe (HCC)     PLAN:  STROKE PREVENTION - continue aspirin, verapamil - check lipid panel and start statin (per PCP) - repeat MRI brain w/wo in 6-12 (follow up right vertebral artery lesion / artifact) - encouraged exercises of right hand  SLEEP APNEA - refer to sleep clinic (last study at [facility] > 6 years ago)  Orders Placed This Encounter  Procedures  . Ambulatory referral to Sleep Studies   Return in about 1 year (around 03/13/2019).    Penni Bombard, MD 09/13/2332, 35:68 AM Certified in Neurology, Neurophysiology and Neuroimaging  [facility] [address] [place], [place] [address] [phone]

## 2018-03-16 DIAGNOSIS — I639 Cerebral infarction, unspecified: Secondary | ICD-10-CM | POA: Diagnosis not present

## 2018-04-13 DIAGNOSIS — D224 Melanocytic nevi of scalp and neck: Secondary | ICD-10-CM | POA: Diagnosis not present

## 2018-04-13 DIAGNOSIS — L570 Actinic keratosis: Secondary | ICD-10-CM | POA: Diagnosis not present

## 2018-04-13 DIAGNOSIS — L918 Other hypertrophic disorders of the skin: Secondary | ICD-10-CM | POA: Diagnosis not present

## 2018-04-13 DIAGNOSIS — L814 Other melanin hyperpigmentation: Secondary | ICD-10-CM | POA: Diagnosis not present

## 2018-05-08 ENCOUNTER — Institutional Professional Consult (permissible substitution): Payer: PPO | Admitting: Neurology

## 2018-06-13 ENCOUNTER — Encounter

## 2018-06-13 ENCOUNTER — Encounter: Payer: Self-pay | Admitting: Neurology

## 2018-06-13 ENCOUNTER — Ambulatory Visit (INDEPENDENT_AMBULATORY_CARE_PROVIDER_SITE_OTHER): Payer: PPO | Admitting: Neurology

## 2018-06-13 VITALS — BP 136/90 | HR 73 | Ht 72.0 in | Wt 206.0 lb

## 2018-06-13 DIAGNOSIS — G4733 Obstructive sleep apnea (adult) (pediatric): Secondary | ICD-10-CM | POA: Diagnosis not present

## 2018-06-13 DIAGNOSIS — I639 Cerebral infarction, unspecified: Secondary | ICD-10-CM | POA: Diagnosis not present

## 2018-06-13 NOTE — Progress Notes (Signed)
SLEEP MEDICINE CLINIC   Provider:  Larey Seat, MD   Primary Care Physician:  Levin Erp, MD   Referring Provider:  Leta Baptist, MD   Chief Complaint  Patient presents with  . New Patient (Initial Visit)    pt alone, rm 10. pt presents after visit with Dr Leta Baptist for a recent stroke. Dr Nyoka Cowden called the office and asked to see stroke MD.  pt had a sleep study and OSA diagnosis with Dr Brett Fairy in 2014, started CPAP initially used it for about a 6 month  and then stopped using it. pt here to restart up.pt was established with DME [facility] and he was set up 07/20/2012, last data was in june 2014. pt has been told he intermittently snores in sleep and he can be restless in sleep. Did not follow up after 12 month.     HPI:  Bryan Hart is a 67 y.o. male patient, seen here on 06-13-2018 in a referral from Dr. Leta Baptist.  I had the pleasure of meeting with Bryan Hart today, who has had a diagnosis of OSA. His wife is a snorer , he is not. He is a restless sleeper. Sleep talker, laughing in sleep. He had not felt any benefit from CPAP and d/c after 6 month.  He now had a stroke, noted the symptoms on 02-27-2018.  He woke up feeling clumsy, unable to type and unable to play piano.  He was calling his PCP with a 2-3 days delay and was seen for ECHO, MRI, Carotid doppler, 03-05-2018.   LV EF: 55% -   60%  ------------------------------------------------------------------- Indications:      CVA 436.  ------------------------------------------------------------------- History:   PMH:  Sleep Apnea.  Risk factors:  Hypertension.  ------------------------------------------------------------------- Study Conclusions  - Left ventricle: The cavity size was normal. Wall thickness was   normal. Systolic function was normal. The estimated ejection   fraction was in the range of 55% to 60%. Wall motion was normal;   there were no regional wall motion abnormalities. Doppler   parameters are  consistent with abnormal left ventricular   relaxation (grade 1 diastolic dysfunction). - Aortic valve: There was no stenosis. There was trivial   regurgitation. - Aorta: Mildly dilated aortic root. - Mitral valve: Mildly calcified annulus. There was trivial   regurgitation. - Left atrium: The atrium was mildly dilated. - Right ventricle: The cavity size was normal. Systolic function   was normal. - Tricuspid valve: Peak RV-RA gradient (S): 29 mm Hg. - Pulmonary arteries: PA peak pressure: 32 mm Hg (S). - Inferior vena cava: The vessel was normal in size. The   respirophasic diameter changes were in the normal range (>= 50%),   consistent with normal central venous pressure. - Pericardium, extracardiac: Pleural effusion noted.  Impressions:  - Normal LV size and systolic function, EF 61-44%. Normal RV size   and systolic function. Borderline pulmonary hypertension.  ------------------------------------------------------------------- Study data:  No prior study was available for comparison.  Study status:  Routine.  Procedure:  The patient reported no pain pre or post test. Transthoracic echocardiography. Image quality was adequate.  Study completion:  There were no complications. Transthoracic echocardiography.  M-mode, complete 2D, spectral Doppler, and color Doppler.  Birthdate:  Patient birthdate: 07/12/1950.  Age:  Patient is 67 yr old.  Sex:  Gender: male. BMI: 26.6 kg/m^2.  Blood pressure:     129/72  Patient status: Outpatient.  Study date:  Study date: 03/05/2018. Study time: 03:09 PM.  Location:  Echo laboratory.  -------------------------------------------------------------------  ------------------------------------------------------------------- Left ventricle:  The cavity size was normal. Wall thickness was normal. Systolic function was normal. The estimated ejection fraction was in the range of 55% to 60%. Wall motion was normal; there were no regional wall  motion abnormalities. Doppler parameters are consistent with abnormal left ventricular relaxation (grade 1 diastolic dysfunction).  ------------------------------------------------------------------- Aortic valve:   Trileaflet.  Doppler:   There was no stenosis. There was trivial regurgitation.  ------------------------------------------------------------------- Aorta:  Mildly dilated aortic root. Ascending aorta: The ascending aorta was normal in size.  ------------------------------------------------------------------- Mitral valve:   Mildly calcified annulus.  Doppler:   There was no evidence for stenosis.   There was trivial regurgitation.    Valve area by pressure half-time: 2.89 cm^2. Indexed valve area by pressure half-time: 1.35 cm^2/m^2.    Peak gradient (D): 3 mm Hg.   ------------------------------------------------------------------- Left atrium:  The atrium was mildly dilated.  ------------------------------------------------------------------- Right ventricle:  The cavity size was normal. Systolic function was normal.  ------------------------------------------------------------------- Pulmonic valve:    Structurally normal valve.   Cusp separation was normal.  Doppler:  Transvalvular velocity was within the normal range. There was mild regurgitation.  ------------------------------------------------------------------- Tricuspid valve:   Doppler:  There was trivial regurgitation.   ------------------------------------------------------------------- Right atrium:  The atrium was normal in size.  ------------------------------------------------------------------- Pericardium:  Pleural effusion noted. There was no pericardial effusion.  ------------------------------------------------------------------- Systemic veins: Inferior vena cava: The vessel was normal in size. The respirophasic diameter changes were in the normal range (>= 50%), consistent with  normal central venous pressure.  ------------------------------------------------------------------- Post procedure conclusions Ascending Aorta:  - Mildly dilated aortic root.  -------------------------------------------------------------------   Left hand weakness, evaluate for stroke  EXAM: MRI HEAD WITHOUT AND WITH CONTRAST  TECHNIQUE: Multiplanar, multiecho pulse sequences of the brain and surrounding structures were obtained without and with intravenous contrast.  CONTRAST:  29mL MULTIHANCE GADOBENATE DIMEGLUMINE 529 MG/ML IV SOLN  COMPARISON:  None.  FINDINGS: Brain: 1 cm focus of restricted diffusion on the left precentral gyrus, at the hand knob. No additional infarct is seen. No superimposed enhancement.  Small remote high and posterior right frontal cortex infarct. Minor FLAIR hyperintensity in the cerebral white matter for age, usually attributed to old microvascular insults.  No acute hemorrhage, hydrocephalus, or mass effect. Normal brain volume.  There is a 6 mm enhancing nodule medial to the right V4 segment near the dural penetration. This is favored to reflect a benign enhancing foramen magnum lesion (although these are usually described posterior to the V4 segment) or schwannoma. No dural contact for meningioma. No flow void for aneurysm.  Vascular: Major flow voids are preserved  Skull and upper cervical spine: No evidence of marrow lesion  Sinuses/Orbits: Negative  IMPRESSION: 1. 1 cm acute infarct on the left motor strip at the hand knob. 2. 6 mm enhancing nodule medial to the right V4 segment. A schwannoma or benign enhancing foramen magnum lesion is favored. Further discussion above. Recommend follow-up brain MRI to ensure stability. 3. Small remote right frontal cortex infarct.   Electronically Signed   By: Monte Fantasia M.D.   On: 02/24/2018 15:03 Sleep habits are as follows:     Sleep medical history and  family sleep history: no history of OSA .   Social history:  Degree:  Masters in Tour manager.  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 Non smoker, rare drinker, caffeine : none.  ( less than  3 years in university) .     Review of Systems: Out of a complete 14 system review, the patient complains of only the following symptoms, and all other reviewed systems are negative.   Epworth score 8 , Fatigue severity score 20 .   Social History   Socioeconomic History  . Marital status: Married    Spouse name: Not on file  . Number of children: Not on file  . Years of education: Not on file  . Highest education level: Not on file  Occupational History  . Not on file  Social Needs  . Financial resource strain: Not on file  . Food insecurity:    Worry: Not on file    Inability: Not on file  . Transportation needs:    Medical: Not on file    Non-medical: Not on file  Tobacco Use  . Smoking status: Former Smoker    Last attempt to quit: 1975    Years since quitting: 44.9  . Smokeless tobacco: Never Used  Substance and Sexual Activity  . Alcohol use: Yes    Alcohol/week: 1.0 - 2.0 standard drinks    Types: 1 - 2 Cans of beer per week    Comment: daily  . Drug use: No  . Sexual activity: Not on file  Lifestyle  . Physical activity:    Days per week: Not on file    Minutes per session: Not on file  . Stress: Not on file  Relationships  . Social connections:    Talks on phone: Not on file    Gets together: Not on file    Attends religious service: Not on file    Active member of club or organization: Not on file    Attends meetings of clubs or organizations: Not on file    Relationship status: Not on file  . Intimate partner violence:    Fear of current or ex partner: Not on file    Emotionally abused: Not on file    Physically abused: Not on file    Forced sexual activity: Not on file  Other Topics Concern  . Not on file    Social History Narrative   Lives home with spouse [name].   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    Family History  Problem Relation Age of Onset  . Heart disease Father   . Hypertension Father   . Heart attack Brother   . Hypertension Brother   . Stroke Paternal Grandmother   . Heart attack Maternal Uncle   . Colon cancer Neg Hx     Past Medical History:  Diagnosis Date  . HLD (hyperlipidemia)   . Hypertension    dr  ed green      stress test   12 yrs ago  . PVC's (premature ventricular contractions) 12 years ago   stress test done  . Sleep apnea   . Stroke [facility])    minor stroke 01/2018    Past Surgical History:  Procedure Laterality Date  . ANKLE ARTHROSCOPY  2010  . JOINT REPLACEMENT  2011   rt shoulder rotator cuff repair  . knee surgeries  [id number]   x2  . SHOULDER HEMI-ARTHROPLASTY  01/12/2012   Procedure: SHOULDER HEMI-ARTHROPLASTY;  Surgeon: Nita Sells, MD;  Location: [place];  Service: Orthopedics;  Laterality: Right;    Current Outpatient Medications  Medication Sig Dispense Refill  . aspirin EC 81 MG tablet Take 81 mg by mouth daily.    Marland Kitchen  atorvastatin (LIPITOR) 20 MG tablet Take 20 mg by mouth daily at 6 PM.    . Multiple Vitamin (MULTIVITAMIN) tablet Take 1 tablet by mouth daily.    Marland Kitchen OVER THE COUNTER MEDICATION Take 1 capsule by mouth daily. Glucosamine-Chondroitin 1500-1200    . verapamil (CALAN-SR) 240 MG CR tablet Take 240 mg by mouth daily.     No current facility-administered medications for this visit.     Allergies as of 06/13/2018 - Review Complete 06/13/2018  Allergen Reaction Noted  . Adhesive [tape] Rash 01/05/2012    Vitals: BP 136/90   Pulse 73   Ht 6' (1.829 m)   Wt 206 lb (93.4 kg)   BMI 27.94 kg/m  Last Weight:  Wt Readings from Last 1 Encounters:  06/13/18 206 lb (93.4 kg)   LPF:XTKW mass index is 27.94 kg/m.     Last Height:   Ht Readings from Last 1 Encounters:  06/13/18 6' (1.829 m)    Physical  exam:  General: The patient is awake, alert and appears not in acute distress. The patient is well groomed. Head: Normocephalic, atraumatic. Neck is supple. Mallampati 3-4, retrognathia.   neck circumference: 17". Nasal airflow patent, Retrognathia is seen. Cardiovascular:  Regular rate and rhythm, without  murmurs or carotid bruit, and without distended neck veins. Respiratory: Lungs are clear to auscultation. Skin:  Without evidence of edema, or rash. Trunk: BMI is 28. The patient's posture is erect.   Neurologic exam : The patient is awake and alert, oriented to place and time.   Memory subjective described as intact.   Attention span & concentration ability appears normal.  Speech is fluent,  without dysarthria, dysphonia or aphasia.  Mood and affect are appropriate.  Cranial nerves: Pupils are equal and briskly reactive to light.  Extraocular movements  in vertical and horizontal planes intact and without nystagmus. Visual fields by finger perimetry are intact. Hearing to finger rub intact.  Facial sensation intact to fine touch. Facial motor strength is symmetric and tongue and uvula move midline. Shoulder shrug was symmetrical.   Motor exam: Normal tone, muscle bulk and symmetric strength in all extremities. Sensory:  Fine touch, pinprick and vibration were tested in all extremities. Proprioception tested in the upper extremities was normal. Coordination: Rapid alternating movements in the fingers/hands was normal. Finger-to-nose maneuver  normal with evidence of ataxia, dysmetria and tremor. He reports that he feels he could drop a bottle form his right grip easily.  Gait and station: Patient walks without assistive device. Strength within normal limits. Stance is stable and normal. Turns with Steps. Romberg testing is negative. Deep tendon reflexes: in the  upper and lower extremities are symmetric and intact. Babinski maneuver response is downgoing.  Assessment:  After physical and  neurologic examination, review of laboratory studies,  Personal review of imaging studies, reports of other /same  Imaging studies, results of polysomnography and / or neurophysiology testing and pre-existing records as far as provided in visit., my assessment is   1) I have the pleasure of seeing this previously established sleep patient today, unfortunately after a stroke.  Her stroke was about 1 cm in size and affected the left frontal region, clinically manifesting as a clumsiness of the right hand. Echocardiogram was largely unremarkable, he had a normal brain volume by MRI and aside from the new stroke there was no other abnormal finding.  Carotid Dopplers also did not show significant vascular stenosis or flow abnormalities. 1 I do not think that sleep  apnea was the main risk factor for this patient unless he would have suffered from a significant severe degree of apnea, I do think that secondary stroke prevention should be our goal and that sleep apnea needs to be retested and if found treated in May.  Since the patient has a quite significant overbite and crowded lower jaw he may actually be a candidate for a dental device should his apnea not be associated with hypoxemia and not strongly dependent on REM sleep.   In order to evaluate hypoxemia and REM sleep proportion I would like for him to have a attended sleep study, if his insurance would deny this I would use a screening device of a home sleep test and follow from there.  The patient was advised of the nature of the diagnosed disorder , the treatment options and the  risks for general health and wellness arising from not treating the condition.   I spent more than 50 minutes of face to face time with the patient.  Greater than 50% of time was spent in counseling and coordination of care. We have discussed the diagnosis and differential and I answered the patient's questions.    Plan:  Treatment plan and additional workup :  Split study ,  if not permitted [place], MD [id number]/1308, 6:57 PM  Certified in Neurology by ABPN Certified in [place] by [facility] [address], [place], [place] [address]

## 2018-06-14 DIAGNOSIS — Z1211 Encounter for screening for malignant neoplasm of colon: Secondary | ICD-10-CM | POA: Diagnosis not present

## 2018-06-14 DIAGNOSIS — I639 Cerebral infarction, unspecified: Secondary | ICD-10-CM | POA: Diagnosis not present

## 2018-07-23 ENCOUNTER — Ambulatory Visit (INDEPENDENT_AMBULATORY_CARE_PROVIDER_SITE_OTHER): Payer: PPO | Admitting: Neurology

## 2018-07-23 DIAGNOSIS — G4733 Obstructive sleep apnea (adult) (pediatric): Secondary | ICD-10-CM

## 2018-07-23 DIAGNOSIS — I639 Cerebral infarction, unspecified: Secondary | ICD-10-CM

## 2018-07-23 DIAGNOSIS — G4731 Primary central sleep apnea: Secondary | ICD-10-CM

## 2018-07-23 DIAGNOSIS — I4430 Unspecified atrioventricular block: Secondary | ICD-10-CM

## 2018-07-29 DIAGNOSIS — G4731 Primary central sleep apnea: Secondary | ICD-10-CM | POA: Insufficient documentation

## 2018-07-29 DIAGNOSIS — I4430 Unspecified atrioventricular block: Secondary | ICD-10-CM | POA: Insufficient documentation

## 2018-07-29 DIAGNOSIS — I639 Cerebral infarction, unspecified: Secondary | ICD-10-CM | POA: Insufficient documentation

## 2018-07-29 NOTE — Addendum Note (Signed)
Addended by: Larey Seat on: 07/29/2018 08:28 PM   Modules accepted: Orders

## 2018-07-29 NOTE — Procedures (Signed)
PATIENT'S NAME:  Bryan Hart, Bryan Hart DOB:      1951-03-22      MR#:    [id number]     DATE OF RECORDING: 07/23/2018 REFERRING M.D.:  Nolberto Hanlon, MD, Zada Girt, MD Study Performed:   Baseline Polysomnogram for CVA Pateint  HISTORY:  Bryan Hart is a 68 y.o. male patient, seen here on 06-13-2018 in a referral via Dr. Leta Baptist.   I had the pleasure of meeting with Mr. Heaton Sarin on 06-13-2018, who had received a diagnosis of OSA at [facility] sleep in 2014. His wife is a snorer, he is not. He is a restless sleeper. Sleep talker, laughing in sleep. He had not felt any benefit from CPAP in the past and d/c treatment after 6 month in 2014-15.  He now had a stroke, noted the symptoms on 02-27-2018.  He woke up feeling clumsy, unable to type and unable to play piano. He was calling his PCP with a 2-3 days delay and was seen for ECHO, MRI, Carotid Doppler, and a Brain MRI on 03-05-2018 which resulted in the following findings: a 1 cm acute infarct on the left motor strip, a 6 mm enhancing nodule medial to the right V4 segment (a schwannoma or benign lesion is favored), and a small remote right frontal cortex infarct.    Medical history: HTN, HLD, PVCs, arthritis, overbite, retrognathia.  The patient endorsed the Epworth Sleepiness Scale at 9 points, FSS at 20/63 points   The patient's weight 205 pounds with a height of 72 (inches), resulting in a BMI of 27.8 kg/m2. The patient's neck circumference measured 17 inches.  CURRENT MEDICATIONS: Aspirin, Lipitor.   PROCEDURE:  This is a multichannel digital polysomnogram utilizing the Somnostar 11.2 system.  Electrodes and sensors were applied and monitored per AASM Specifications.   EEG, EOG, Chin and Limb EMG, were sampled at 200 Hz.  ECG, Snore and Nasal Pressure, Thermal Airflow, Respiratory Effort, CPAP Flow and Pressure, Oximetry was sampled at 50 Hz. Digital video and audio were recorded.      BASELINE STUDY: Lights Out was at 22:44 and Lights On at 05:00.  Total  recording time (TRT) was 376.5 minutes, with a total sleep time (TST) of 199.5 minutes.   The patient's sleep latency was extreme at 171 minutes.  REM latency was 188 minutes.  The sleep efficiency was 53.1 %.     SLEEP ARCHITECTURE: WASO (Wake after sleep onset) was 100.5 minutes.  There were 36.5 minutes in Stage N1, 57.5 minutes Stage N2, 65 minutes Stage N3 and 40.5 minutes in Stage REM.  The percentage of Stage N1 was 18.3%, Stage N2 was 28.8%, Stage N3 was 32.6% and Stage R (REM sleep) was 20.3%.   RESPIRATORY ANALYSIS:  There were a total of 44 respiratory events:  1 obstructive apnea, 16 central apneas and 9 mixed apneas with a total of 26 apneas and an apnea index (AI) of 7.8 /hour. There were 18 hypopneas with a hypopnea index of 5.4 /hour. The total APNEA/HYPOPNEA INDEX (AHI) was 13.2 /hour.  3 events occurred in REM sleep and 58 events in NREM. The REM AHI was 4.4 /hour, versus a non-REM AHI of 15.5. The patient spent 82.5 minutes of total sleep time in the supine position and 117 minutes in non-supine. The supine AHI was 26.1 versus a non-supine AHI of 4.1.  OXYGEN SATURATION & C02:  The Wake baseline 02 saturation was 97%, with the lowest being 88%. Time spent below 89% saturation equaled only  3 minutes.  AROUSALS: The arousals were noted as: 38 were spontaneous, 0 were associated with PLMs, and 14 were associated with respiratory events.   The patient had a total of 5 Periodic Limb Movements.  The Periodic Limb Movement (PLM) index was 1.5 and the PLM Arousal index was 0/hour. Some PLMs were seen during REM sleep, indicating a propensity for REM BD.  Audio and video analysis did not show any abnormal or complex movements, behaviors, phonations or vocalizations. The patient took two bathroom breaks. Soft snoring was noted. EKG was in sinus rhythm (NSR) with frequent PVCs, some PACs and AV block. See screen shots. .  IMPRESSION:  1. Central -Complex Sleep Apnea with NREM sleep dominance  (AHI 15.5/h). Supine AHI was 26.1/h. 2.   Abnormal EKG with frequent PVCs, some PACs and AV block. See attached screen shots. . 3.   Few PLMs overall, but some movements were noted in REM sleep. 4.   Insomnia or delayed sleep phase? Very poor sleep efficiency, extreme sleep latency.   Post-study, the patient indicated that sleep was the same as usual.     RECOMMENDATIONS:   1. Advise full-night, attended, PAP titration study to optimize therapy.  Central apnea is not treated by autotitration as CPAP can lead to more central apneas to emerge. 2. Tech: Please watch for REM sleep with limb movements. 3. If CPAP exacerbates AHI switch to BIPAP, ASV whatever is needed.  4. Allow patient to try interface of his comfort and choice.    I certify that I have reviewed the entire raw data recording prior to the issuance of this report in accordance with the Standards of Accreditation of the American Academy of Sleep Medicine (AASM)     Larey Seat, MD    07-29-2018 Diplomat, American Board of Psychiatry and Neurology  Diplomat, American Board of [place] Director, [place] [facility] [employer]

## 2018-07-31 ENCOUNTER — Encounter: Payer: Self-pay | Admitting: Neurology

## 2018-07-31 ENCOUNTER — Telehealth: Payer: Self-pay | Admitting: Neurology

## 2018-07-31 NOTE — Telephone Encounter (Signed)
Pt returned call. Please call back as soon as available.  °

## 2018-07-31 NOTE — Telephone Encounter (Signed)
-----   Message from Larey Seat, MD sent at 07/29/2018  8:28 PM EST ----- RECOMMENDATIONS:   1. Advise full-night, attended, PAP titration study to optimize  therapy. Central apnea is not treated by autotitration as CPAP  can lead to more central apneas to emerge.  2. Tech: Please watch for REM sleep with limb movements. If CPAP exacerbates AHI switch to BIPAP, ASV whatever is  needed.  Allow patient to try interface of his comfort and choice.

## 2018-07-31 NOTE — Telephone Encounter (Signed)
Called patient to discuss sleep study results. No answer at this time. LVM for the patient to call back.   

## 2018-07-31 NOTE — Telephone Encounter (Signed)
I called pt. I advised pt that Dr. Brett Fairy reviewed their sleep study results and found that patient has sleep apnea and recommends that pt be treated with a cpap. Dr. Brett Fairy recommends that pt return for a repeat sleep study in order to properly titrate the cpap and ensure a good mask fit. Pt is agreeable to returning for a titration study. I advised pt that our sleep lab will file with pt's insurance and call pt to schedule the sleep study when we hear back from the pt's insurance regarding coverage of this sleep study. Pt verbalized understanding of results. Pt had no questions at this time but was encouraged to call back if questions arise.

## 2018-08-30 ENCOUNTER — Telehealth: Payer: Self-pay

## 2018-08-30 NOTE — Telephone Encounter (Signed)
Pt came to sleep lab yesterday, 08/29/2018, for a mask desensitization appointment. Spent over 30 minutes talking and educating patient on our masks and importance of CPAP. Pt is scheduled for his titration study on 09/03/2018. Pt is to bring his mask from home(ResMed Swift FX nasal pillow mask size medium pillows) for that titration. Pt may want to try a full face mask due to some recent nasal congestion issues. Pt was initiated on CPAP of 7cm with EPR set at 3. Pt stated that pressure felt the best and very comfortable to breath with. Pt was also interested in the ResMed Airfit P10 nasal pillow mask.

## 2018-09-03 ENCOUNTER — Ambulatory Visit (INDEPENDENT_AMBULATORY_CARE_PROVIDER_SITE_OTHER): Payer: PPO | Admitting: Neurology

## 2018-09-03 DIAGNOSIS — G4731 Primary central sleep apnea: Secondary | ICD-10-CM

## 2018-09-03 DIAGNOSIS — G4733 Obstructive sleep apnea (adult) (pediatric): Secondary | ICD-10-CM | POA: Diagnosis not present

## 2018-09-03 DIAGNOSIS — I4430 Unspecified atrioventricular block: Secondary | ICD-10-CM

## 2018-09-03 DIAGNOSIS — G4730 Sleep apnea, unspecified: Secondary | ICD-10-CM

## 2018-09-03 DIAGNOSIS — I6349 Cerebral infarction due to embolism of other cerebral artery: Secondary | ICD-10-CM

## 2018-09-03 DIAGNOSIS — G4700 Insomnia, unspecified: Secondary | ICD-10-CM

## 2018-09-06 NOTE — Addendum Note (Signed)
Addended by: Larey Seat on: 09/06/2018 05:19 PM   Modules accepted: Orders

## 2018-09-06 NOTE — Procedures (Signed)
PATIENT'S NAME:  Bryan Hart, Bryan Hart DOB:      20-May-1951      MR#:    [id number]     DATE OF RECORDING: 09/03/2018  [facility] REFERRING M.D.:  Levin Erp, MD, Andrey Spearman, MD Study Performed:   CPAP  Titration Patient with multiple small strokes returned for a PAP Titration sleep study, after having a Baseline PSG on 07/23/2018 at [facility] sleep. This 68 year old Patient had an AHI of 15.5 mostly central apnea, Supine AHI of 26.1/h and frequent extrasystoles. He had a very poor sleep efficiency.  The patient endorsed the Epworth Sleepiness Scale at 8/24 points.   The patient's weight 206 pounds with a height of 72 (inches), resulting in a BMI of 27.8 kg/m2. The patient's neck circumference measured 17 inches.  CURRENT MEDICATIONS: Aspirin, Lipitor  PROCEDURE:  This is a multichannel digital polysomnogram utilizing the SomnoStar 11.2 system.  Electrodes and sensors were applied and monitored per AASM Specifications.   EEG, EOG, Chin and Limb EMG, were sampled at 200 Hz.  ECG, Snore and Nasal Pressure, Thermal Airflow, Respiratory Effort, CPAP Flow and Pressure, Oximetry was sampled at 50 Hz. Digital video and audio were recorded.      CPAP was initiated with a nasal mask, N 20, in large size (ResMed) , pressure was initiated at 5 cmH20 with heated humidity per AASM split night standards and pressure was advanced to 9 cmH20 because of central apneas. Best tolerated was a pressure setting of CPAP at 7 cm water. The technologist changed to BiPAP -10/6 and advanced to a BIPAP pressure of 13/8 cmH20, there was a mainly central AHI of 24/h without any improvement of sleep apnea. BiPAP ST was not documented.   Lights Out was at 20:55 and Lights On at 03:46. Total recording time (TRT) was 411.5 minutes, with a total sleep time (TST) of 211.5 minutes. The patient's sleep latency was 59 minutes. REM latency was 0 minutes.  The sleep efficiency was very poor at 51.4 %.    SLEEP ARCHITECTURE: WASO (Wake after sleep  onset) was 101 minutes.  There were 20 minutes in Stage N1, 168 minutes Stage N2, 23.5 minutes Stage N3 and 0 minutes in Stage REM.  The percentage of Stage N1 was 9.5%, Stage N2 was 79.4%, Stage N3 was 11.1% and Stage R (REM sleep) was 0%.   RESPIRATORY ANALYSIS:  There was a total of 85 respiratory events: 13 obstructive apneas, 69 central apneas and 0 mixed apneas with 3 hypopneas.     The total APNEA/HYPOPNEA INDEX (AHI) was 24.1 /hour.0 events occurred in REM sleep and 85 events in NREM. The REM AHI was 0 /hour versus a non-REM AHI of 24.1 /hour.  The patient spent 193.5 minutes of total sleep time in the supine position and 18 minutes in non-supine. The supine AHI was 25.7, versus a non-supine AHI of 6.7.  OXYGEN SATURATION & C02:  The baseline 02 saturation was 96%, with the lowest being 90%. Time spent below 89% saturation equaled 0 minutes.  PERIODIC LIMB MOVEMENTS:  The patient had a total of 5 Periodic Limb Movements. The Periodic Limb Movement (PLM) index was 1.4 and the PLM Arousal index was 0.3 /hour.   Audio and video analysis did not show any abnormal or unusual movements, behaviors, phonations or vocalizations.  EKG highly abnormal.  DIAGNOSIS 1. Central Sleep Apnea unresponsive to CPAP, BiPAP, and reportedly to BiPAP ST.  2. Abnormal EKG. 3. Insomnia   PLANS/RECOMMENDATIONS: Goal was a full  night titration to PAP modalities, but the patient responded only to lowest CPAP of 7 cm water (AHI at 1.0), as central apneas exacerbated with all other settings.     DISCUSSION: Strongly recommend return for ASV titration.    A follow up appointment will be scheduled in the Sleep Clinic at [facility].   Please call [phone] with any questions.      I certify that I have reviewed the entire raw data recording prior to the issuance of this report in accordance with the Standards of Accreditation of the American Academy of Sleep Medicine (AASM)     Larey Seat, M.D.    09-06-2018 Diplomat, American Board of Psychiatry and Neurology  Diplomat, [place] of Sleep Medicine Medical Director, [place] [facility] at [facility]

## 2018-09-10 ENCOUNTER — Telehealth: Payer: Self-pay | Admitting: Neurology

## 2018-09-10 NOTE — Telephone Encounter (Signed)
I called pt. I advised pt that Dr. Brett Fairy reviewed their sleep study results and found that pt was unable to get his apnea treated with CPAP, BIPAP and ASV. Dr. Brett Fairy recommends that pt return for a repeat sleep study in order to properly titrate the ASV and ensure a good mask fit. Pt is agreeable to returning for a titration study. I advised pt that our sleep lab will file with pt's insurance and call pt to schedule the sleep study when we hear back from the pt's insurance regarding coverage of this sleep study. Pt verbalized understanding of results. Pt had no questions at this time but was encouraged to call back if questions arise.

## 2018-09-10 NOTE — Telephone Encounter (Signed)
-----   Message from Larey Seat, MD sent at 09/06/2018  5:19 PM EDT ----- DIAGNOSIS 1. Central Sleep Apnea unresponsive to CPAP, BiPAP, and  reportedly to BiPAP ST.  2. Abnormal EKG. 3. Insomnia   PLANS/RECOMMENDATIONS: Goal was a full night titration to PAP  modalities, but the patient responded only to lowest CPAP of 7 cm  water (AHI at 1.0), as central apneas exacerbated with all other  settings.    DISCUSSION: Strongly recommend return for ASV titration.

## 2018-12-25 DIAGNOSIS — I6381 Other cerebral infarction due to occlusion or stenosis of small artery: Secondary | ICD-10-CM | POA: Diagnosis not present

## 2018-12-25 DIAGNOSIS — G4733 Obstructive sleep apnea (adult) (pediatric): Secondary | ICD-10-CM | POA: Diagnosis not present

## 2018-12-25 DIAGNOSIS — I10 Essential (primary) hypertension: Secondary | ICD-10-CM | POA: Diagnosis not present

## 2018-12-27 ENCOUNTER — Ambulatory Visit: Payer: Self-pay

## 2018-12-27 ENCOUNTER — Ambulatory Visit (INDEPENDENT_AMBULATORY_CARE_PROVIDER_SITE_OTHER): Payer: PPO | Admitting: Orthopaedic Surgery

## 2018-12-27 ENCOUNTER — Encounter: Payer: Self-pay | Admitting: Orthopaedic Surgery

## 2018-12-27 ENCOUNTER — Ambulatory Visit: Payer: PPO | Admitting: Orthopaedic Surgery

## 2018-12-27 ENCOUNTER — Other Ambulatory Visit: Payer: Self-pay

## 2018-12-27 VITALS — BP 107/56 | HR 78 | Resp 16 | Ht 72.0 in | Wt 196.0 lb

## 2018-12-27 DIAGNOSIS — M170 Bilateral primary osteoarthritis of knee: Secondary | ICD-10-CM | POA: Diagnosis not present

## 2018-12-27 DIAGNOSIS — M25561 Pain in right knee: Secondary | ICD-10-CM | POA: Diagnosis not present

## 2018-12-27 DIAGNOSIS — M25562 Pain in left knee: Secondary | ICD-10-CM | POA: Diagnosis not present

## 2018-12-27 DIAGNOSIS — G8929 Other chronic pain: Secondary | ICD-10-CM

## 2018-12-27 MED ORDER — LIDOCAINE HCL 1 % IJ SOLN
2.0000 mL | INTRAMUSCULAR | Status: AC | PRN
  Administered 2018-12-27: 2 mL

## 2018-12-27 MED ORDER — BUPIVACAINE HCL 0.5 % IJ SOLN
2.0000 mL | INTRAMUSCULAR | Status: AC | PRN
  Administered 2018-12-27: 2 mL via INTRA_ARTICULAR

## 2018-12-27 MED ORDER — METHYLPREDNISOLONE ACETATE 40 MG/ML IJ SUSP
80.0000 mg | INTRAMUSCULAR | Status: AC | PRN
  Administered 2018-12-27: 80 mg via INTRA_ARTICULAR

## 2018-12-27 NOTE — Progress Notes (Signed)
Office Visit Note   Patient: Bryan Hart           Date of Birth: 07/08/1950           MRN: [id number] Visit Date: 12/27/2018              Requested by: Levin Erp, MD [address] [place],  [place] [address] PCP: Levin Erp, MD   Assessment & Plan: Visit Diagnoses:  1. Chronic pain of both knees   2. Bilateral primary osteoarthritis of knee     Plan: End-stage tricompartmental osteoarthritis right knee.  Moderate osteoarthritis left knee.  Long discussion over 30 minutes regarding all of the above.  Discussed different treatment options including total knee replacement needs to think about today I am going to aspirate his right knee and inject cortisone  Follow-Up Instructions: No follow-ups on file.   Orders:  Orders Placed This Encounter  Procedures  . XR KNEE 3 VIEW LEFT  . XR KNEE 3 VIEW RIGHT   No orders of the defined types were placed in this encounter.     Procedures: Large Joint Inj: R knee on 12/27/2018 1:51 PM Indications: pain and diagnostic evaluation Details: 25 G 1.5 in needle, anteromedial approach  Arthrogram: No  Medications: 2 mL bupivacaine 0.5 %; 2 mL lidocaine 1 %; 80 mg methylPREDNISolone acetate 40 MG/ML Aspirate: 30 mL clear Outcome: tolerated well, no immediate complications Procedure, treatment alternatives, risks and benefits explained, specific risks discussed. Consent was given by the patient. Immediately prior to procedure a time out was called to verify the correct patient, procedure, equipment, support staff and site/side marked as required. Patient was prepped and draped in the usual sterile fashion.       Clinical Data: No additional findings.   Subjective: Chief Complaint  Patient presents with  . Left Knee - Pain  . Right Knee - Pain   Mr. Shaheen is a 68 year old male who presents with bilateral knee pain. He has had arthroscopic surgery on his right knee by Dr. Durward Fortes x 20 years. He has not had  surgery on his left knee. He is not diabetic. He had not had an injury. He is having popping and clicking noises. He has some knee swelling. He is using a brace on the right knee. He takes Advil prn for pain which helps.  Prior right knee films reveal end-stage osteoarthritis 3 compartments.  Takes Advil as needed wears a pullover knee support and gets along "fairly well".  Recently has had more trouble with the left knee occasional sensation of his knee giving way.  No fever or chills.  Both knees will occasionally "swell".  HPI  Review of Systems  Constitutional: Negative for fatigue.  HENT: Negative for trouble swallowing.   Eyes: Negative for pain.  Respiratory: Negative for shortness of breath.   Cardiovascular: Negative for leg swelling.  Gastrointestinal: Negative for constipation.  Endocrine: Negative for cold intolerance.  Genitourinary: Negative for difficulty urinating.  Musculoskeletal: Positive for joint swelling.  Skin: Negative for rash.  Allergic/Immunologic: Negative for food allergies.  Neurological: Negative for weakness.  Hematological: Does not bruise/bleed easily.  Psychiatric/Behavioral: Negative for sleep disturbance.     Objective: Vital Signs: BP (!) 107/56 (BP Location: Right Arm, Patient Position: Sitting, Cuff Size: Normal)   Pulse 78   Resp 16   Ht 6' (1.829 m)   Wt 196 lb (88.9 kg)   BMI 26.58 kg/m   Physical Exam Constitutional:  Appearance: He is well-developed.  Eyes:     Pupils: Pupils are equal, round, and reactive to light.  Pulmonary:     Effort: Pulmonary effort is normal.  Skin:    General: Skin is warm and dry.  Neurological:     Mental Status: He is alert and oriented to person, place, and time.  Psychiatric:        Behavior: Behavior normal.     Ortho Exam awake alert and oriented x3.  Comfortable sitting.  Both knees with increased varus.  Full extension on the left lacks a few degrees to full extension on the right.   Small effusions bilaterally.  Predominately medial joint pain.  Bilateral knee crepitation.  Both knees flex over 105 degrees without instability.  No distal edema.  No calf pain.  No thigh pain .no hip pain  Specialty Comments:  No specialty comments available.  Imaging: No results found.   PMFS History: Patient Active Problem List   Diagnosis Date Noted  . Bilateral primary osteoarthritis of knee 12/27/2018  . AV block 07/29/2018  . Central sleep apnea 07/29/2018  . Cerebrovascular accident ([place]) 07/29/2018  . Primary localized osteoarthrosis, shoulder region 01/13/2012   Past Medical History:  Diagnosis Date  . HLD (hyperlipidemia)   . Hypertension    dr  ed green      stress test   12 yrs ago  . PVC's (premature ventricular contractions) 12 years ago   stress test done  . Sleep apnea   . Stroke [facility])    minor stroke 01/2018    Family History  Problem Relation Age of Onset  . Heart disease Father   . Hypertension Father   . Heart attack Brother   . Hypertension Brother   . Stroke Paternal Grandmother   . Heart attack Maternal Uncle   . Colon cancer Neg Hx     Past Surgical History:  Procedure Laterality Date  . ANKLE ARTHROSCOPY  2010  . JOINT REPLACEMENT  2011   rt shoulder rotator cuff repair  . knee surgeries  [id number]   x2  . SHOULDER HEMI-ARTHROPLASTY  01/12/2012   Procedure: SHOULDER HEMI-ARTHROPLASTY;  Surgeon: Nita Sells, MD;  Location: [place];  Service: Orthopedics;  Laterality: Right;   Social History   Occupational History  . Not on file  Tobacco Use  . Smoking status: Former Smoker    Packs/day: 0.50    Years: 4.00    Pack years: 2.00    Types: Cigarettes    Quit date: 1975    Years since quitting: 45.5  . Smokeless tobacco: Never Used  Substance and Sexual Activity  . Alcohol use: Yes    Alcohol/week: 1.0 - 2.0 standard drinks    Types: 1 - 2 Cans of beer per week    Comment: daily  . Drug use: No  . Sexual activity: Not  on file

## 2019-01-07 ENCOUNTER — Other Ambulatory Visit (HOSPITAL_COMMUNITY): Payer: Self-pay

## 2019-02-12 ENCOUNTER — Other Ambulatory Visit: Payer: Self-pay

## 2019-02-12 ENCOUNTER — Ambulatory Visit: Payer: PPO | Admitting: Orthopaedic Surgery

## 2019-02-12 ENCOUNTER — Encounter: Payer: Self-pay | Admitting: Orthopaedic Surgery

## 2019-02-12 VITALS — BP 118/71 | HR 64 | Ht 72.0 in | Wt 196.0 lb

## 2019-02-12 DIAGNOSIS — M1711 Unilateral primary osteoarthritis, right knee: Secondary | ICD-10-CM | POA: Diagnosis not present

## 2019-02-12 DIAGNOSIS — M170 Bilateral primary osteoarthritis of knee: Secondary | ICD-10-CM

## 2019-02-12 MED ORDER — BUPIVACAINE HCL 0.5 % IJ SOLN
2.0000 mL | INTRAMUSCULAR | Status: AC | PRN
  Administered 2019-02-12: 2 mL via INTRA_ARTICULAR

## 2019-02-12 MED ORDER — LIDOCAINE HCL 1 % IJ SOLN
2.0000 mL | INTRAMUSCULAR | Status: AC | PRN
  Administered 2019-02-12: 2 mL

## 2019-02-12 MED ORDER — METHYLPREDNISOLONE ACETATE 40 MG/ML IJ SUSP
80.0000 mg | INTRAMUSCULAR | Status: AC | PRN
  Administered 2019-02-12: 16:00:00 80 mg via INTRA_ARTICULAR

## 2019-02-12 NOTE — Addendum Note (Signed)
Addended by: Lendon Collar on: 02/12/2019 04:29 PM   Modules accepted: Orders

## 2019-02-12 NOTE — Progress Notes (Signed)
Office Visit Note   Patient: Bryan Hart           Date of Birth: 1950-09-11           MRN: [id number] Visit Date: 02/12/2019              Requested by: Levin Erp, MD [address] [place],  [place] [address] PCP: Levin Erp, MD   Assessment & Plan: Visit Diagnoses:  1. Bilateral primary osteoarthritis of knee     Plan: Recurrent effusion right knee.  I aspirated 120 cc of cloudy fluid will send to the lab and then injected cortisone.  Long discussion regarding his knee and what he may expect over time.  Of also discussed knee replacement  Follow-Up Instructions: Return if symptoms worsen or fail to improve.   Orders:  Orders Placed This Encounter  Procedures  . Large Joint Inj: R knee   No orders of the defined types were placed in this encounter.     Procedures: Large Joint Inj: R knee on 02/12/2019 4:24 PM Indications: pain and diagnostic evaluation Details: 25 G 1.5 in needle, anteromedial approach  Arthrogram: No  Medications: 2 mL lidocaine 1 %; 2 mL bupivacaine 0.5 %; 80 mg methylPREDNISolone acetate 40 MG/ML Aspirate: 120 mL cloudy and yellow; sent for lab analysis Procedure, treatment alternatives, risks and benefits explained, specific risks discussed. Consent was given by the patient. Immediately prior to procedure a time out was called to verify the correct patient, procedure, equipment, support staff and site/side marked as required. Patient was prepped and draped in the usual sterile fashion.       Clinical Data: No additional findings.   Subjective: Chief Complaint  Patient presents with  . Right Knee - Pain  . Left Knee - Pain  Patient presents today for recurrent bilateral knee pain. He was last here on 12/27/2018 and received bilateral cortisone injections. The injections helped until two days ago. A couple days ago his right knee became very swollen and has continued to stay swollen. He is taking Advil only as needed, but  rarely.   HPI  Review of Systems   Objective: Vital Signs: BP 118/71   Pulse 64   Ht 6' (1.829 m)   Wt 196 lb (88.9 kg)   BMI 26.58 kg/m   Physical Exam Constitutional:      Appearance: He is well-developed.  Eyes:     Pupils: Pupils are equal, round, and reactive to light.  Pulmonary:     Effort: Pulmonary effort is normal.  Skin:    General: Skin is warm and dry.  Neurological:     Mental Status: He is alert and oriented to person, place, and time.  Psychiatric:        Behavior: Behavior normal.     Ortho Exam right knee with large effusion.  Knee was a little warm.  Obvious degenerative changes with hypertrophic changes.  Had some loss of motion related to the size of the effusion.  Neurologically intact  Specialty Comments:  No specialty comments available.  Imaging: No results found.   PMFS History: Patient Active Problem List   Diagnosis Date Noted  . Bilateral primary osteoarthritis of knee 12/27/2018  . AV block 07/29/2018  . Central sleep apnea 07/29/2018  . Cerebrovascular accident ([place]) 07/29/2018  . Primary localized osteoarthrosis, shoulder region 01/13/2012   Past Medical History:  Diagnosis Date  . HLD (hyperlipidemia)   . Hypertension    dr  ed green      stress test   12 yrs ago  . PVC's (premature ventricular contractions) 12 years ago   stress test done  . Sleep apnea   . Stroke [facility])    minor stroke 01/2018    Family History  Problem Relation Age of Onset  . Heart disease Father   . Hypertension Father   . Heart attack Brother   . Hypertension Brother   . Stroke Paternal Grandmother   . Heart attack Maternal Uncle   . Colon cancer Neg Hx     Past Surgical History:  Procedure Laterality Date  . ANKLE ARTHROSCOPY  2010  . JOINT REPLACEMENT  2011   rt shoulder rotator cuff repair  . knee surgeries  [id number]   x2  . SHOULDER HEMI-ARTHROPLASTY  01/12/2012   Procedure: SHOULDER HEMI-ARTHROPLASTY;  Surgeon: Nita Sells, MD;  Location: [place];  Service: Orthopedics;  Laterality: Right;   Social History   Occupational History  . Not on file  Tobacco Use  . Smoking status: Former Smoker    Packs/day: 0.50    Years: 4.00    Pack years: 2.00    Types: Cigarettes    Quit date: 1975    Years since quitting: 45.6  . Smokeless tobacco: Never Used  Substance and Sexual Activity  . Alcohol use: Yes    Alcohol/week: 1.0 - 2.0 standard drinks    Types: 1 - 2 Cans of beer per week    Comment: daily  . Drug use: No  . Sexual activity: Not on file

## 2019-02-18 LAB — SYNOVIAL CELL COUNT + DIFF, W/ CRYSTALS
Basophils, %: 0 %
Eosinophils-Synovial: 0 % (ref 0–2)
Lymphocytes-Synovial Fld: 10 % (ref 0–74)
Monocyte/Macrophage: 3 % (ref 0–69)
Neutrophil, Synovial: 87 % — ABNORMAL HIGH (ref 0–24)
Synoviocytes, %: 0 % (ref 0–15)
WBC, Synovial: 7110 cells/uL — ABNORMAL HIGH (ref <150)

## 2019-02-18 LAB — ANAEROBIC AND AEROBIC CULTURE
AER RESULT:: NO GROWTH
MICRO NUMBER:: 787545
MICRO NUMBER:: 787546
SPECIMEN QUALITY:: ADEQUATE
SPECIMEN QUALITY:: ADEQUATE

## 2019-03-19 ENCOUNTER — Encounter: Payer: Self-pay | Admitting: Diagnostic Neuroimaging

## 2019-03-19 ENCOUNTER — Other Ambulatory Visit: Payer: Self-pay

## 2019-03-19 ENCOUNTER — Telehealth: Payer: Self-pay | Admitting: Diagnostic Neuroimaging

## 2019-03-19 ENCOUNTER — Ambulatory Visit (INDEPENDENT_AMBULATORY_CARE_PROVIDER_SITE_OTHER): Payer: PPO | Admitting: Diagnostic Neuroimaging

## 2019-03-19 VITALS — BP 142/85 | HR 70 | Temp 97.3°F | Ht 72.0 in | Wt 198.0 lb

## 2019-03-19 DIAGNOSIS — R9089 Other abnormal findings on diagnostic imaging of central nervous system: Secondary | ICD-10-CM | POA: Diagnosis not present

## 2019-03-19 DIAGNOSIS — I639 Cerebral infarction, unspecified: Secondary | ICD-10-CM | POA: Diagnosis not present

## 2019-03-19 MED ORDER — ALPRAZOLAM 0.5 MG PO TABS: ORAL_TABLET | ORAL | 0 refills | Status: DC

## 2019-03-19 NOTE — Telephone Encounter (Signed)
health team order sent to GI. No auth they will reach out to the patient to schedule.  

## 2019-03-19 NOTE — Patient Instructions (Signed)
  STROKE PREVENTION - continue aspirin, verapamil, statin  - encouraged exercises of right hand  VERTEBRAL ARTERY LESION - repeat MRI brain w/wo; also check MRA head / neck  SLEEP APNEA - follow up with Dr. Brett Fairy

## 2019-03-19 NOTE — Progress Notes (Signed)
[facility]  PATIENT: Bryan Hart DOB: Jun 08, 1951  REFERRING CLINICIAN: E Green HISTORY FROM: patient  REASON FOR VISIT: follow up    HISTORICAL  CHIEF COMPLAINT:  Chief Complaint  Patient presents with  . History of Stroke    rm 7 one year FU "doing well"    HISTORY OF PRESENT ILLNESS:   UPDATE (03/19/19, VRP): Since last visit, doing well. Symptoms are stable. Severity is mild. No alleviating or aggravating factors. Tolerating meds.  PRIOR HPI (03/12/18): 68 year old left-handed male here for evaluation of stroke.  History of hypertension and sleep apnea.  Approximately 3 weeks of patient noticed mild numbness in his right hand, digits 3 and 4, with some clumsiness of the right hand.  He had recently started a muscle relaxer and thought this may be a side effect.  Few days later patient went into PCP for evaluation, who recognized possible stroke symptoms and ordered MRI of the brain.  MRI of the brain which confirmed acute to subacute ischemic infarction of the left precentral gyrus, correlating to patient's symptoms.  Echocardiogram and carotid ultrasound were obtained which were unremarkable.  Patient was started on aspirin 81 mg/day and referred here for further follow-up.  Patient denies any numbness or weakness in the right face and right leg.  No headaches.  No recent injuries or traumas.  Symptoms are gradually improving.  Patient was diagnosed with mild to moderate sleep apnea 6-7 years ago, but could not tolerate CPAP mask.  He did not have any other further follow-up.   REVIEW OF SYSTEMS: Full 14 system review of systems performed and negative with exception of: as per HPI.   ALLERGIES: Allergies  Allergen Reactions  . Adhesive [Tape] Rash    HOME MEDICATIONS: Outpatient Medications Prior to Visit  Medication Sig Dispense Refill  . aspirin EC 81 MG tablet Take 81 mg by mouth daily.    Marland Kitchen atorvastatin (LIPITOR) 20 MG tablet Take 20 mg by  mouth daily at 6 PM.    . Multiple Vitamin (MULTIVITAMIN) tablet Take 1 tablet by mouth daily.    Marland Kitchen OVER THE COUNTER MEDICATION Take 1 capsule by mouth daily. Glucosamine-Chondroitin 1500-1200    . verapamil (CALAN-SR) 240 MG CR tablet Take 240 mg by mouth daily.     No facility-administered medications prior to visit.     PAST MEDICAL HISTORY: Past Medical History:  Diagnosis Date  . HLD (hyperlipidemia)   . Hypertension    dr  ed green      stress test   12 yrs ago  . PVC's (premature ventricular contractions) 12 years ago   stress test done  . Sleep apnea   . Stroke [facility])    minor stroke 01/2018    PAST SURGICAL HISTORY: Past Surgical History:  Procedure Laterality Date  . ANKLE ARTHROSCOPY  2010  . JOINT REPLACEMENT  2011   rt shoulder rotator cuff repair  . knee surgeries  [id number]   x2  . SHOULDER HEMI-ARTHROPLASTY  01/12/2012   Procedure: SHOULDER HEMI-ARTHROPLASTY;  Surgeon: Nita Sells, MD;  Location: [place];  Service: Orthopedics;  Laterality: Right;    FAMILY HISTORY: Family History  Problem Relation Age of Onset  . Heart disease Father   . Hypertension Father   . Heart attack Brother   . Hypertension Brother   . Stroke Paternal Grandmother   . Heart attack Maternal Uncle   . Heart disease Brother   . Colon cancer Neg Hx  SOCIAL HISTORY: Social History   Socioeconomic History  . Marital status: Married    Spouse name: [name]  . Number of children: Not on file  . Years of education: Not on file  . Highest education level: Master's degree (e.g., MA, MS, MEng, MEd, MSW, MBA)  Occupational History  . Not on file  Social Needs  . Financial resource strain: Not on file  . Food insecurity    Worry: Not on file    Inability: Not on file  . Transportation needs    Medical: Not on file    Non-medical: Not on file  Tobacco Use  . Smoking status: Former Smoker    Packs/day: 0.50    Years: 4.00    Pack years: 2.00    Types: Cigarettes     Quit date: 1975    Years since quitting: 45.7  . Smokeless tobacco: Never Used  Substance and Sexual Activity  . Alcohol use: Yes    Alcohol/week: 1.0 - 2.0 standard drinks    Types: 1 - 2 Cans of beer per week    Comment: daily  . Drug use: No  . Sexual activity: Not on file  Lifestyle  . Physical activity    Days per week: Not on file    Minutes per session: Not on file  . Stress: Not on file  Relationships  . Social Herbalist on phone: Not on file    Gets together: Not on file    Attends religious service: Not on file    Active member of club or organization: Not on file    Attends meetings of clubs or organizations: Not on file    Relationship status: Not on file  . Intimate partner violence    Fear of current or ex partner: Not on file    Emotionally abused: Not on file    Physically abused: Not on file    Forced sexual activity: Not on file  Other Topics Concern  . Not on file  Social History Narrative   Lives home with spouse [name].   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     PHYSICAL EXAM  GENERAL EXAM/CONSTITUTIONAL: Vitals:  Vitals:   03/19/19 1008  BP: (!) 142/85  Pulse: 70  Temp: (!) 97.3 F (36.3 C)  Weight: 198 lb (89.8 kg)  Height: 6' (1.829 m)   Body mass index is 26.85 kg/m. Wt Readings from Last 3 Encounters:  03/19/19 198 lb (89.8 kg)  02/12/19 196 lb (88.9 kg)  12/27/18 196 lb (88.9 kg)    Patient is in no distress; well developed, nourished and groomed; neck is supple  CARDIOVASCULAR:  Examination of carotid arteries is normal; no carotid bruits  Regular rate and rhythm, no murmurs  Examination of peripheral vascular system by observation and palpation is normal  EYES:  Ophthalmoscopic exam of optic discs and posterior segments is normal; no papilledema or hemorrhages No exam data present  MUSCULOSKELETAL:  Gait, strength, tone, movements noted in Neurologic exam below  NEUROLOGIC: MENTAL STATUS:  No  flowsheet data found.  awake, alert, oriented to person, place and time  recent and remote memory intact  normal attention and concentration  language fluent, comprehension intact, naming intact  fund of knowledge appropriate  CRANIAL NERVE:   2nd - no papilledema on fundoscopic exam  2nd, 3rd, 4th, 6th - pupils equal and reactive to light, visual fields full to confrontation, extraocular muscles intact, no nystagmus  5th - facial sensation  symmetric  7th - facial strength symmetric  8th - hearing intact  9th - palate elevates symmetrically, uvula midline  11th - shoulder shrug symmetric  12th - tongue protrusion midline  MOTOR:   normal bulk and tone, full strength in the BUE, BLE  SENSORY:   normal and symmetric to light touch, temperature, vibration  COORDINATION:   finger-nose-finger, fine finger movements normal  REFLEXES:   deep tendon reflexes present and symmetric  GAIT/STATION:   narrow based gait     DIAGNOSTIC DATA (LABS, IMAGING, TESTING) - I reviewed patient records, labs, notes, testing and imaging myself where available.  Lab Results  Component Value Date   WBC 6.6 01/13/2012   HGB 11.5 (L) 01/13/2012   HCT 35.1 (L) 01/13/2012   MCV 91.4 01/13/2012   PLT 207 01/13/2012      Component Value Date/Time   NA 136 01/13/2012 0505   K 4.1 01/13/2012 0505   CL 103 01/13/2012 0505   CO2 25 01/13/2012 0505   GLUCOSE 104 (H) 01/13/2012 0505   BUN 10 01/13/2012 0505   CREATININE 1.29 (H) 02/24/2018 1230   CALCIUM 8.4 01/13/2012 0505   PROT 7.2 01/12/2012 0634   ALBUMIN 4.1 01/12/2012 0634   AST 23 01/12/2012 0634   ALT 20 01/12/2012 0634   ALKPHOS 87 01/12/2012 0634   BILITOT 0.3 01/12/2012 0634   GFRNONAA 55 (L) 02/24/2018 1230   GFRAA >60 02/24/2018 1230   No results found for: CHOL, HDL, LDLCALC, LDLDIRECT, TRIG, CHOLHDL No results found for: HGBA1C No results found for: VITAMINB12 No results found for: TSH   03/05/18 TTE   - Normal LV size and systolic function, EF 0000000. Normal RV size   and systolic function. Borderline pulmonary hypertension.  03/05/18 carotid u/s Right Carotid: Velocities in the right ICA are consistent with a 1-39% stenosis. Left Carotid: Velocities in the left ICA are consistent with a 1-39% stenosis. Vertebrals: Bilateral vertebral arteries demonstrate antegrade flow. Subclavians: Normal flow hemodynamics were seen in bilateral subclavian arteries.  02/24/18 MRI brain (with and without) [I reviewed images myself and agree with interpretation. -VRP]  1. 1 cm acute infarct on the left motor strip at the hand knob. 2. 6 mm enhancing nodule medial to the right V4 segment. A schwannoma or benign enhancing foramen magnum lesion is favored. Further discussion above. Recommend follow-up brain MRI to ensure stability. 3. Small remote right frontal cortex infarct.   ASSESSMENT AND PLAN  68 y.o. year old male here with HTN, sleep apnea, here for stroke evaluation.   Ddx: left frontal stroke (artery-artery embolism vs small vessel thrombosis)  1. Ischemic stroke of frontal lobe ([place])   2. MRI of brain abnormal      PLAN:  STROKE PREVENTION - continue aspirin, verapamil, statin  - encouraged exercises of right hand  VERTEBRAL ARTERY LESION - repeat MRI brain w/wo; also check MRA head / neck (follow up right vertebral artery lesion / artifact)  SLEEP APNEA - follow up with Dr. Brett Fairy  Meds ordered this encounter  Medications  . ALPRAZolam (XANAX) 0.5 MG tablet    Sig: for sedation before MRI scan; take 1 tab 1 hour before scan; may repeat 1 tab 15 min before scan    Dispense:  3 tablet    Refill:  0   Orders Placed This Encounter  Procedures  . MR BRAIN W WO CONTRAST  . MR ANGIO HEAD WO CONTRAST  . MR ANGIO NECK W WO CONTRAST   Return  pending test results, for pending if symptoms worsen or fail to improve.    Penni Bombard, MD [id number], 0000000 AM Certified in  Neurology, Neurophysiology and Neuroimaging  [facility] [address], [place], [place] [address] [phone]

## 2019-04-02 DIAGNOSIS — I10 Essential (primary) hypertension: Secondary | ICD-10-CM | POA: Diagnosis not present

## 2019-04-02 DIAGNOSIS — I639 Cerebral infarction, unspecified: Secondary | ICD-10-CM | POA: Diagnosis not present

## 2019-04-02 DIAGNOSIS — Z23 Encounter for immunization: Secondary | ICD-10-CM | POA: Diagnosis not present

## 2019-04-02 DIAGNOSIS — M179 Osteoarthritis of knee, unspecified: Secondary | ICD-10-CM | POA: Diagnosis not present

## 2019-04-10 ENCOUNTER — Ambulatory Visit
Admission: RE | Admit: 2019-04-10 | Discharge: 2019-04-10 | Disposition: A | Discharge status: Home / Self Care | Payer: PPO | Source: Ambulatory Visit | Attending: Diagnostic Neuroimaging | Admitting: Diagnostic Neuroimaging

## 2019-04-10 ENCOUNTER — Other Ambulatory Visit: Payer: Self-pay

## 2019-04-10 DIAGNOSIS — R9089 Other abnormal findings on diagnostic imaging of central nervous system: Secondary | ICD-10-CM

## 2019-04-10 DIAGNOSIS — I639 Cerebral infarction, unspecified: Secondary | ICD-10-CM

## 2019-04-10 MED ORDER — GADOBENATE DIMEGLUMINE 529 MG/ML IV SOLN
19.0000 mL | Freq: Once | INTRAVENOUS | Status: AC | PRN
  Administered 2019-04-10: 19 mL via INTRAVENOUS

## 2019-04-16 ENCOUNTER — Telehealth: Payer: Self-pay | Admitting: *Deleted

## 2019-04-16 NOTE — Telephone Encounter (Signed)
LVM requesting call back for MRI, MR angio results.

## 2019-04-16 NOTE — Telephone Encounter (Signed)
Patient returned call. I informed him his MRI brain is unremarkable with no major findings. His MR angio of head/neck is normal. I reviewed Dr Gladstone Lighter plan with him. Advised he call for any questions, concerns, problems. He  verbalized understanding, appreciation.

## 2019-06-17 DIAGNOSIS — I639 Cerebral infarction, unspecified: Secondary | ICD-10-CM | POA: Diagnosis not present

## 2019-06-17 DIAGNOSIS — Z1211 Encounter for screening for malignant neoplasm of colon: Secondary | ICD-10-CM | POA: Diagnosis not present

## 2019-06-17 DIAGNOSIS — I10 Essential (primary) hypertension: Secondary | ICD-10-CM | POA: Diagnosis not present

## 2019-07-03 DIAGNOSIS — L821 Other seborrheic keratosis: Secondary | ICD-10-CM | POA: Diagnosis not present

## 2019-07-03 DIAGNOSIS — L570 Actinic keratosis: Secondary | ICD-10-CM | POA: Diagnosis not present

## 2019-07-03 DIAGNOSIS — L820 Inflamed seborrheic keratosis: Secondary | ICD-10-CM | POA: Diagnosis not present

## 2019-07-16 ENCOUNTER — Ambulatory Visit: Payer: Self-pay

## 2019-07-16 ENCOUNTER — Encounter: Payer: Self-pay | Admitting: Orthopaedic Surgery

## 2019-07-16 ENCOUNTER — Other Ambulatory Visit: Payer: Self-pay

## 2019-07-16 ENCOUNTER — Ambulatory Visit (INDEPENDENT_AMBULATORY_CARE_PROVIDER_SITE_OTHER): Payer: PPO | Admitting: Orthopaedic Surgery

## 2019-07-16 VITALS — Ht 72.0 in | Wt 196.0 lb

## 2019-07-16 DIAGNOSIS — M25562 Pain in left knee: Secondary | ICD-10-CM | POA: Diagnosis not present

## 2019-07-16 DIAGNOSIS — G8929 Other chronic pain: Secondary | ICD-10-CM

## 2019-07-16 DIAGNOSIS — M170 Bilateral primary osteoarthritis of knee: Secondary | ICD-10-CM

## 2019-07-16 MED ORDER — METHYLPREDNISOLONE ACETATE 40 MG/ML IJ SUSP
80.0000 mg | INTRAMUSCULAR | Status: AC | PRN
  Administered 2019-07-16: 15:00:00 80 mg via INTRA_ARTICULAR

## 2019-07-16 MED ORDER — BUPIVACAINE HCL 0.5 % IJ SOLN
2.0000 mL | INTRAMUSCULAR | Status: AC | PRN
  Administered 2019-07-16: 15:00:00 2 mL via INTRA_ARTICULAR

## 2019-07-16 MED ORDER — LIDOCAINE HCL 1 % IJ SOLN
2.0000 mL | INTRAMUSCULAR | Status: AC | PRN
  Administered 2019-07-16: 15:00:00 2 mL

## 2019-07-16 NOTE — Progress Notes (Signed)
Office Visit Note   Patient: Bryan Hart           Date of Birth: 08-Sep-1950           MRN: [id number] Visit Date: 07/16/2019              Requested by: Levin Erp, MD [place],  [place] [address] PCP: Levin Erp, MD   Assessment & Plan: Visit Diagnoses:  1. Chronic pain of left knee   2. Bilateral primary osteoarthritis of knee     Plan: Long discussion over 30 minutes regarding the arthritis in both of his knees.  He has had established arthritis in both knees but more so on the right.  Many years ago in high school he had an injury and has developed advanced end-stage tricompartment arthritis he has been wearing a brace and he has had cortisone in the past with some relief.  He is aware that he is at some point might need a knee replacement.  More recently has had a bit of trouble on the left side.  X-rays today reveal tricompartmental degenerative changes.  I have injected the left knee as it was more symptomatic with cortisone and will see him back in 2 weeks and consider aspiration and cortisone injection on the right we have discussed knee replacement in the past but I am not sure he is really ready for that yet  Follow-Up Instructions: Return in about 2 weeks (around 07/30/2019).   Orders:  Orders Placed This Encounter  Procedures  . Large Joint Inj: L knee  . XR KNEE 3 VIEW LEFT   No orders of the defined types were placed in this encounter.     Procedures: Large Joint Inj: L knee on 07/16/2019 3:13 PM Indications: pain and diagnostic evaluation Details: 25 G 1.5 in needle, anteromedial approach  Arthrogram: No  Medications: 2 mL lidocaine 1 %; 2 mL bupivacaine 0.5 %; 80 mg methylPREDNISolone acetate 40 MG/ML Procedure, treatment alternatives, risks and benefits explained, specific risks discussed. Consent was given by the patient. Patient was prepped and draped in the usual sterile fashion.       Clinical Data: No additional  findings.   Subjective: Chief Complaint  Patient presents with  . Left Knee - Pain  Patient presents today for left knee pain. He was last evaluated for bilateral knee pain in July of 2020 and received a cortisone injection in the right knee. He said that the injection was helpful. The left knee is painful if he sits and bends his knee. Upon standing he has to take his time to extend it straight again. After it is straight again he is okay. He said it feels like a dull ache most of the time but worse after prolonged sitting with flexion.   HPI  Review of Systems   Objective: Vital Signs: Ht 6' (1.829 m)   Wt 196 lb (88.9 kg)   BMI 26.58 kg/m   Physical Exam Constitutional:      Appearance: He is well-developed.  Eyes:     Pupils: Pupils are equal, round, and reactive to light.  Pulmonary:     Effort: Pulmonary effort is normal.  Skin:    General: Skin is warm and dry.  Neurological:     Mental Status: He is alert and oriented to person, place, and time.  Psychiatric:        Behavior: Behavior normal.     Ortho Exam awake alert  and oriented x3.  Comfortable sitting.  Right knee with a pullover knee support.  Positive effusion.  Increased varus and predominant medial joint pain but some pain laterally and even pain with patella compression.  There certainly is crepitation with patella motion.  No instability.  Felt a small mass in the popliteal space which could be a popliteal cyst but was asymptomatic  In the left knee there was a very small effusion but not increased heat there are peripheral osteophytes but palpable particularly medially with slight varus.  There is some patellar crepitation.  Good pulses distally motor exam intact Specialty Comments:  No specialty comments available.  Imaging: XR KNEE 3 VIEW LEFT  Result Date: 07/16/2019 Films of the left knee were obtained in 3 projections standing.  There are significant tricompartmental degenerative changes [place] in  the medial compartment.  There is nearly bone-on-bone with peripheral osteophytes and subchondral sclerosis.  There were more moderate changes of arthritis of the patellofemoral lateral compartment.  Very minimal varus.  No ectopic calcification within the menisci.  Films are consistent with advanced osteoarthritis    PMFS History: Patient Active Problem List   Diagnosis Date Noted  . Bilateral primary osteoarthritis of knee 12/27/2018  . AV block 07/29/2018  . Central sleep apnea 07/29/2018  . Cerebrovascular accident ([address]) 07/29/2018  . Primary localized osteoarthrosis, shoulder region 01/13/2012   Past Medical History:  Diagnosis Date  . HLD (hyperlipidemia)   . Hypertension    dr  ed green      stress test   12 yrs ago  . PVC's (premature ventricular contractions) 12 years ago   stress test done  . Sleep apnea   . Stroke [facility])    minor stroke 01/2018    Family History  Problem Relation Age of Onset  . Heart disease Father   . Hypertension Father   . Heart attack Brother   . Hypertension Brother   . Stroke Paternal Grandmother   . Heart attack Maternal Uncle   . Heart disease Brother   . Colon cancer Neg Hx     Past Surgical History:  Procedure Laterality Date  . ANKLE ARTHROSCOPY  2010  . JOINT REPLACEMENT  2011   rt shoulder rotator cuff repair  . knee surgeries  [id number]   x2  . SHOULDER HEMI-ARTHROPLASTY  01/12/2012   Procedure: SHOULDER HEMI-ARTHROPLASTY;  Surgeon: Nita Sells, MD;  Location: [place];  Service: Orthopedics;  Laterality: Right;   Social History   Occupational History  . Not on file  Tobacco Use  . Smoking status: Former Smoker    Packs/day: 0.50    Years: 4.00    Pack years: 2.00    Types: Cigarettes    Quit date: 1975    Years since quitting: 46.0  . Smokeless tobacco: Never Used  Substance and Sexual Activity  . Alcohol use: Yes    Alcohol/week: 1.0 - 2.0 standard drinks    Types: 1 - 2 Cans of beer per week    Comment:  daily  . Drug use: No  . Sexual activity: Not on file

## 2019-07-28 ENCOUNTER — Ambulatory Visit: Payer: PPO

## 2019-07-30 ENCOUNTER — Other Ambulatory Visit: Payer: Self-pay

## 2019-07-30 ENCOUNTER — Ambulatory Visit (INDEPENDENT_AMBULATORY_CARE_PROVIDER_SITE_OTHER): Payer: PPO | Admitting: Orthopaedic Surgery

## 2019-07-30 ENCOUNTER — Encounter: Payer: Self-pay | Admitting: Orthopaedic Surgery

## 2019-07-30 VITALS — Ht 72.0 in | Wt 196.0 lb

## 2019-07-30 DIAGNOSIS — M1711 Unilateral primary osteoarthritis, right knee: Secondary | ICD-10-CM

## 2019-07-30 DIAGNOSIS — M1712 Unilateral primary osteoarthritis, left knee: Secondary | ICD-10-CM | POA: Diagnosis not present

## 2019-07-30 DIAGNOSIS — M170 Bilateral primary osteoarthritis of knee: Secondary | ICD-10-CM

## 2019-07-30 MED ORDER — LIDOCAINE HCL 1 % IJ SOLN
2.0000 mL | INTRAMUSCULAR | Status: AC | PRN
  Administered 2019-07-30: 2 mL

## 2019-07-30 MED ORDER — BUPIVACAINE HCL 0.5 % IJ SOLN
2.0000 mL | INTRAMUSCULAR | Status: AC | PRN
  Administered 2019-07-30: 2 mL via INTRA_ARTICULAR

## 2019-07-30 MED ORDER — METHYLPREDNISOLONE ACETATE 40 MG/ML IJ SUSP
80.0000 mg | INTRAMUSCULAR | Status: AC | PRN
  Administered 2019-07-30: 80 mg via INTRA_ARTICULAR

## 2019-07-30 NOTE — Progress Notes (Signed)
Office Visit Note   Patient: Bryan Hart           Date of Birth: 07-04-1950           MRN: [id number] Visit Date: 07/30/2019              Requested by: Levin Erp, MD [place], [address],  [place] [address] PCP: Levin Erp, MD   Assessment & Plan: Visit Diagnoses:  1. Bilateral primary osteoarthritis of knee     Plan: Seen several weeks ago with a cortisone injection left knee and doing quite well.  Having recurrent symptoms of arthritis right knee with recurrent effusion.  We will aspirate today and inject with cortisone and monitor response.  Have had disc questions regarding knee replacement at some point in the future given his end-stage arthritis  Follow-Up Instructions: Return if symptoms worsen or fail to improve.   Orders:  Orders Placed This Encounter  Procedures  . Large Joint Inj: R knee   No orders of the defined types were placed in this encounter.     Procedures: Large Joint Inj: R knee on 07/30/2019 2:10 PM Indications: pain and diagnostic evaluation Details: 18 G 1.5 in needle, anteromedial approach  Arthrogram: No  Medications: 2 mL lidocaine 1 %; 2 mL bupivacaine 0.5 %; 80 mg methylPREDNISolone acetate 40 MG/ML Aspirate: 50 mL yellow and clear Procedure, treatment alternatives, risks and benefits explained, specific risks discussed. Consent was given by the patient. Immediately prior to procedure a time out was called to verify the correct patient, procedure, equipment, support staff and site/side marked as required. Patient was prepped and draped in the usual sterile fashion.       Clinical Data: No additional findings.   Subjective: Chief Complaint  Patient presents with  . Right Knee - Pain  . Left Knee - Pain  Patient presents today for a two week follow up on his knees. He had his left knee injected with cortisone two weeks ago. His left knee is doing great. He returns today to have his right knee injected.  HPI   Review of Systems   Objective: Vital Signs: Ht 6' (1.829 m)   Wt 196 lb (88.9 kg)   BMI 26.58 kg/m   Physical Exam Constitutional:      Appearance: He is well-developed.  Eyes:     Pupils: Pupils are equal, round, and reactive to light.  Pulmonary:     Effort: Pulmonary effort is normal.  Skin:    General: Skin is warm and dry.  Neurological:     Mental Status: He is alert and oriented to person, place, and time.  Psychiatric:        Behavior: Behavior normal.     Ortho Exam awake alert and oriented x3.  Comfortable sitting right knee with increased varus.  Lacks just a few degrees to full extension flexed over 100 degrees without instability.  Positive effusion.  The knee was minimally warm.  Mostly medial joint pain with some patellar crepitation  Specialty Comments:  No specialty comments available.  Imaging: No results found.   PMFS History: Patient Active Problem List   Diagnosis Date Noted  . Bilateral primary osteoarthritis of knee 12/27/2018  . AV block 07/29/2018  . Central sleep apnea 07/29/2018  . Cerebrovascular accident ([place]) 07/29/2018  . Primary localized osteoarthrosis, shoulder region 01/13/2012   Past Medical History:  Diagnosis Date  . HLD (hyperlipidemia)   . Hypertension    dr  ed green      stress test   12 yrs ago  . PVC's (premature ventricular contractions) 12 years ago   stress test done  . Sleep apnea   . Stroke [facility])    minor stroke 01/2018    Family History  Problem Relation Age of Onset  . Heart disease Father   . Hypertension Father   . Heart attack Brother   . Hypertension Brother   . Stroke Paternal Grandmother   . Heart attack Maternal Uncle   . Heart disease Brother   . Colon cancer Neg Hx     Past Surgical History:  Procedure Laterality Date  . ANKLE ARTHROSCOPY  2010  . JOINT REPLACEMENT  2011   rt shoulder rotator cuff repair  . knee surgeries  [id number]   x2  . SHOULDER HEMI-ARTHROPLASTY  01/12/2012    Procedure: SHOULDER HEMI-ARTHROPLASTY;  Surgeon: Nita Sells, MD;  Location: [place];  Service: Orthopedics;  Laterality: Right;   Social History   Occupational History  . Not on file  Tobacco Use  . Smoking status: Former Smoker    Packs/day: 0.50    Years: 4.00    Pack years: 2.00    Types: Cigarettes    Quit date: 1975    Years since quitting: 46.1  . Smokeless tobacco: Never Used  Substance and Sexual Activity  . Alcohol use: Yes    Alcohol/week: 1.0 - 2.0 standard drinks    Types: 1 - 2 Cans of beer per week    Comment: daily  . Drug use: No  . Sexual activity: Not on file

## 2019-08-03 ENCOUNTER — Ambulatory Visit: Payer: PPO | Attending: Internal Medicine

## 2019-08-03 DIAGNOSIS — Z23 Encounter for immunization: Secondary | ICD-10-CM | POA: Insufficient documentation

## 2019-08-03 NOTE — Progress Notes (Signed)
   Covid-19 Vaccination Clinic  Name:  Bryan Hart    MRN: [id number] DOB: 1950/07/31  08/03/2019  [name] was observed post Covid-19 immunization for 15 minutes without incidence. He was provided with Vaccine Information Sheet and instruction to access the V-Safe system.   [name] was instructed to call 911 with any severe reactions post vaccine: Marland Kitchen Difficulty breathing  . Swelling of your face and throat  . A fast heartbeat  . A bad rash all over your body  . Dizziness and weakness    Immunizations Administered    Name Date Dose VIS Date Route   Pfizer COVID-19 Vaccine 08/03/2019  8:54 AM 0.3 mL 06/07/2019 Intramuscular   Manufacturer: [place]   Lot: [id number]   [place]: [id number]

## 2019-08-08 ENCOUNTER — Ambulatory Visit: Payer: PPO

## 2019-08-12 DIAGNOSIS — R748 Abnormal levels of other serum enzymes: Secondary | ICD-10-CM | POA: Diagnosis not present

## 2019-08-12 DIAGNOSIS — Z8673 Personal history of transient ischemic attack (TIA), and cerebral infarction without residual deficits: Secondary | ICD-10-CM | POA: Diagnosis not present

## 2019-08-12 DIAGNOSIS — G4733 Obstructive sleep apnea (adult) (pediatric): Secondary | ICD-10-CM | POA: Diagnosis not present

## 2019-08-12 DIAGNOSIS — I493 Ventricular premature depolarization: Secondary | ICD-10-CM | POA: Diagnosis not present

## 2019-08-12 DIAGNOSIS — E7849 Other hyperlipidemia: Secondary | ICD-10-CM | POA: Diagnosis not present

## 2019-08-12 DIAGNOSIS — I10 Essential (primary) hypertension: Secondary | ICD-10-CM | POA: Diagnosis not present

## 2019-08-12 DIAGNOSIS — K219 Gastro-esophageal reflux disease without esophagitis: Secondary | ICD-10-CM | POA: Diagnosis not present

## 2019-08-12 DIAGNOSIS — E663 Overweight: Secondary | ICD-10-CM | POA: Diagnosis not present

## 2019-08-27 ENCOUNTER — Ambulatory Visit: Payer: PPO | Attending: Internal Medicine

## 2019-08-27 DIAGNOSIS — Z23 Encounter for immunization: Secondary | ICD-10-CM | POA: Insufficient documentation

## 2019-08-27 NOTE — Progress Notes (Signed)
   Covid-19 Vaccination Clinic  Name:  Bryan Hart    MRN: [id number] DOB: [birth year]  08/27/2019  Mr. Lorenzini was observed post Covid-19 immunization for 15 minutes without incident. He was provided with Vaccine Information Sheet and instruction to access the V-Safe system.   Mr. Kees was instructed to call 911 with any severe reactions post vaccine: Marland Kitchen Difficulty breathing  . Swelling of face and throat  . A fast heartbeat  . A bad rash all over body  . Dizziness and weakness   Immunizations Administered    Name Date Dose VIS Date Route   Pfizer COVID-19 Vaccine 08/27/2019  8:42 AM 0.3 mL 06/07/2019 Intramuscular   Manufacturer: [place]   Lot: [id number]   [place]: [id number]

## 2019-10-02 DIAGNOSIS — L820 Inflamed seborrheic keratosis: Secondary | ICD-10-CM | POA: Diagnosis not present

## 2019-10-02 DIAGNOSIS — L570 Actinic keratosis: Secondary | ICD-10-CM | POA: Diagnosis not present

## 2020-01-01 ENCOUNTER — Other Ambulatory Visit: Payer: Self-pay

## 2020-01-01 ENCOUNTER — Ambulatory Visit: Payer: PPO | Admitting: Orthopaedic Surgery

## 2020-01-01 ENCOUNTER — Encounter: Payer: Self-pay | Admitting: Orthopaedic Surgery

## 2020-01-01 VITALS — Ht 72.0 in | Wt 175.0 lb

## 2020-01-01 DIAGNOSIS — M1712 Unilateral primary osteoarthritis, left knee: Secondary | ICD-10-CM

## 2020-01-01 DIAGNOSIS — M170 Bilateral primary osteoarthritis of knee: Secondary | ICD-10-CM

## 2020-01-01 MED ORDER — BUPIVACAINE HCL 0.5 % IJ SOLN
2.0000 mL | INTRAMUSCULAR | Status: AC | PRN
  Administered 2020-01-01: 2 mL via INTRA_ARTICULAR

## 2020-01-01 MED ORDER — METHYLPREDNISOLONE ACETATE 40 MG/ML IJ SUSP
80.0000 mg | INTRAMUSCULAR | Status: AC | PRN
  Administered 2020-01-01: 80 mg via INTRA_ARTICULAR

## 2020-01-01 MED ORDER — LIDOCAINE HCL 1 % IJ SOLN
2.0000 mL | INTRAMUSCULAR | Status: AC | PRN
  Administered 2020-01-01: 2 mL

## 2020-01-01 NOTE — Progress Notes (Signed)
Office Visit Note   Patient: Bryan Hart           Date of Birth: Mar 02, 1951           MRN: [id number] Visit Date: 01/01/2020              Requested by: Sueanne Margarita, DO [address] [place],  [place] [address] PCP: Sueanne Margarita, DO   Assessment & Plan: Visit Diagnoses:  1. Bilateral primary osteoarthritis of knee     Plan: Doing relatively well after the recent cortisone injection in the right knee.  Would like to have a cortisone injection the left knee.  Prior films demonstrate end-stage osteoarthritic changes bilaterally.  We have had a discussion of knee replacement in the past but is not ready.  Will reinject the left knee today  Follow-Up Instructions: Return if symptoms worsen or fail to improve.   Orders:  Orders Placed This Encounter  Procedures  . Large Joint Inj: L knee   No orders of the defined types were placed in this encounter.     Procedures: Large Joint Inj: L knee on 01/01/2020 4:07 PM Indications: pain and diagnostic evaluation Details: 25 G 1.5 in needle, anteromedial approach  Arthrogram: No  Medications: 2 mL lidocaine 1 %; 2 mL bupivacaine 0.5 %; 80 mg methylPREDNISolone acetate 40 MG/ML Procedure, treatment alternatives, risks and benefits explained, specific risks discussed. Consent was given by the patient. Patient was prepped and draped in the usual sterile fashion.       Clinical Data: No additional findings.   Subjective: Chief Complaint  Patient presents with  . Left Knee - Pain  Patient presents today for his left knee pain. He was here for this knee last in January of 2021 and received a cortisone injection. He states that the injection has been great for 6 months. He would like to get another injection today. He has noticed that he has a difficult time extending his leg if he has been at rest with it flexed. No grinding.   HPI  Review of Systems   Objective: Vital Signs: Ht 6' (1.829 m)   Wt 175 lb (79.4 kg)    BMI 23.73 kg/m   Physical Exam Constitutional:      Appearance: He is well-developed.  Eyes:     Pupils: Pupils are equal, round, and reactive to light.  Pulmonary:     Effort: Pulmonary effort is normal.  Skin:    General: Skin is warm and dry.  Neurological:     Mental Status: He is alert and oriented to person, place, and time.  Psychiatric:        Behavior: Behavior normal.     Ortho Exam left knee with very minimal effusion.  The knee was not hot warm or red.  Palpable osteophytes along the medial compartment.  Very minimal tenderness.  Full extension.  Positive patella crepitation but no pain with compression.  No lateral joint pain.  Slight increased varus with weightbearing.  Good pulses distally.  Motor exam intact  Specialty Comments:  No specialty comments available.  Imaging: No results found.   PMFS History: Patient Active Problem List   Diagnosis Date Noted  . Bilateral primary osteoarthritis of knee 12/27/2018  . AV block 07/29/2018  . Central sleep apnea 07/29/2018  . Cerebrovascular accident ([place]) 07/29/2018  . Primary localized osteoarthrosis, shoulder region 01/13/2012   Past Medical History:  Diagnosis Date  . HLD (hyperlipidemia)   . Hypertension  dr  ed green      stress test   12 yrs ago  . PVC's (premature ventricular contractions) 12 years ago   stress test done  . Sleep apnea   . Stroke [facility])    minor stroke 01/2018    Family History  Problem Relation Age of Onset  . Heart disease Father   . Hypertension Father   . Heart attack Brother   . Hypertension Brother   . Stroke Paternal Grandmother   . Heart attack Maternal Uncle   . Heart disease Brother   . Colon cancer Neg Hx     Past Surgical History:  Procedure Laterality Date  . ANKLE ARTHROSCOPY  2010  . JOINT REPLACEMENT  2011   rt shoulder rotator cuff repair  . knee surgeries  [id number]   x2  . SHOULDER HEMI-ARTHROPLASTY  01/12/2012   Procedure: SHOULDER  HEMI-ARTHROPLASTY;  Surgeon: Nita Sells, MD;  Location: [facility];  Service: Orthopedics;  Laterality: Right;   Social History   Occupational History  . Not on file  Tobacco Use  . Smoking status: Former Smoker    Packs/day: 0.50    Years: 4.00    Pack years: 2.00    Types: Cigarettes    Quit date: 1975    Years since quitting: 46.5  . Smokeless tobacco: Never Used  Vaping Use  . Vaping Use: Never used  Substance and Sexual Activity  . Alcohol use: Yes    Alcohol/week: 1.0 - 2.0 standard drink    Types: 1 - 2 Cans of beer per week    Comment: daily  . Drug use: No  . Sexual activity: Not on file

## 2020-03-04 ENCOUNTER — Telehealth: Payer: Self-pay | Admitting: Orthopaedic Surgery

## 2020-03-04 NOTE — Telephone Encounter (Signed)
Called patient concerning mychart message. Advised patient message was received and appointment was confirmed

## 2020-03-11 ENCOUNTER — Telehealth: Payer: Self-pay

## 2020-03-11 ENCOUNTER — Other Ambulatory Visit: Payer: Self-pay

## 2020-03-11 ENCOUNTER — Encounter: Payer: Self-pay | Admitting: Orthopaedic Surgery

## 2020-03-11 ENCOUNTER — Ambulatory Visit: Payer: PPO | Admitting: Orthopaedic Surgery

## 2020-03-11 VITALS — Ht 72.0 in | Wt 175.0 lb

## 2020-03-11 DIAGNOSIS — M170 Bilateral primary osteoarthritis of knee: Secondary | ICD-10-CM

## 2020-03-11 DIAGNOSIS — M1711 Unilateral primary osteoarthritis, right knee: Secondary | ICD-10-CM

## 2020-03-11 MED ORDER — METHYLPREDNISOLONE ACETATE 40 MG/ML IJ SUSP
80.0000 mg | INTRAMUSCULAR | Status: AC | PRN
  Administered 2020-03-11: 80 mg via INTRA_ARTICULAR

## 2020-03-11 MED ORDER — LIDOCAINE HCL 1 % IJ SOLN
2.0000 mL | INTRAMUSCULAR | Status: AC | PRN
  Administered 2020-03-11: 2 mL

## 2020-03-11 MED ORDER — BUPIVACAINE HCL 0.5 % IJ SOLN
2.0000 mL | INTRAMUSCULAR | Status: AC | PRN
  Administered 2020-03-11: 2 mL via INTRA_ARTICULAR

## 2020-03-11 NOTE — Telephone Encounter (Signed)
Please precert for left knee visco injections. This is Dr.Whitfield's patient. Thanks! 

## 2020-03-11 NOTE — Progress Notes (Signed)
Office Visit Note   Patient: Bryan Hart           Date of Birth: Apr 27, 1951           MRN: [id number] Visit Date: 03/11/2020              Requested by: Sueanne Margarita, DO [address] [place],  [place] [address] PCP: Sueanne Margarita, DO   Assessment & Plan: Visit Diagnoses:  1. Bilateral primary osteoarthritis of knee     Plan: Drelyn had cortisone injection in the left knee just over 2 months ago and has had some recurrent pain.  He is having sensation of stiffness and recurrent effusion.  It appears to interfere with his activities.  He has had films of both knees demonstrating end-stage changes bilaterally more so on the right than the left.  More recently the left knee has become more symptomatic.  We reviewed the x-rays today and discussed the different treatment options.  I do not think he is quite ready for knee replacement but I think it is worth considering viscosupplementation.  We will aspirate the knee today and inject cortisone and pre-CERT for Visco supplementation  Follow-Up Instructions: Return Will pre-CERT Visco supplementation I dictated right.   Orders:  Orders Placed This Encounter  Procedures  . Large Joint Inj: L knee   No orders of the defined types were placed in this encounter.     Procedures: Large Joint Inj: L knee on 03/11/2020 10:13 AM Indications: pain and diagnostic evaluation Details: 25 G 1.5 in needle, anteromedial approach  Arthrogram: No  Medications: 2 mL lidocaine 1 %; 2 mL bupivacaine 0.5 %; 80 mg methylPREDNISolone acetate 40 MG/ML Aspirate: 18 mL clear Outcome: tolerated well, no immediate complications Procedure, treatment alternatives, risks and benefits explained, specific risks discussed. Consent was given by the patient. Patient was prepped and draped in the usual sterile fashion.       Clinical Data: No additional findings.   Subjective: Chief Complaint  Patient presents with  . Left Knee - Pain  Patient  presents today for recurrent left knee pain. He was here two months ago and received a left knee cortisone injection. Patient states that this last injection only helped for about 3 weeks, but the one before lasted months. He has pain posteriorly and cannot stand for prolonged periods. He has pain with extending his knee after he has sat with it flexed. He does not take anything for pain.  Has end-stage changes of arthritis in both of his knees.  The changes are more significant on the right than the left knee, but the left knee is more symptomatic.  He is beginning to experience compromise of activities  HPI  Review of Systems   Objective: Vital Signs: Ht 6' (1.829 m)   Wt 175 lb (79.4 kg)   BMI 23.73 kg/m   Physical Exam Constitutional:      Appearance: He is well-developed.  Eyes:     Pupils: Pupils are equal, round, and reactive to light.  Pulmonary:     Effort: Pulmonary effort is normal.  Skin:    General: Skin is warm and dry.  Neurological:     Mental Status: He is alert and oriented to person, place, and time.  Psychiatric:        Behavior: Behavior normal.     Ortho Exam left knee with positive effusion.  Increased varus.  Palpable osteophytes along the medial compartment.  He lacks about  8 degrees of full knee extension and flexed over 100 degrees.  No instability.  There is some fullness in the popliteal space which feels like a popliteal cyst.  No calf pain.  No distal edema.  Neurologically intact.  No instability.  Straight leg raise negative.  Painless range of motion of left hip  Specialty Comments:  No specialty comments available.  Imaging: No results found.   PMFS History: Patient Active Problem List   Diagnosis Date Noted  . Bilateral primary osteoarthritis of knee 12/27/2018  . AV block 07/29/2018  . Central sleep apnea 07/29/2018  . Cerebrovascular accident ([place]) 07/29/2018  . Primary localized osteoarthrosis, shoulder region 01/13/2012   Past  Medical History:  Diagnosis Date  . HLD (hyperlipidemia)   . Hypertension    dr  ed green      stress test   12 yrs ago  . PVC's (premature ventricular contractions) 12 years ago   stress test done  . Sleep apnea   . Stroke [facility])    minor stroke 01/2018    Family History  Problem Relation Age of Onset  . Heart disease Father   . Hypertension Father   . Heart attack Brother   . Hypertension Brother   . Stroke Paternal Grandmother   . Heart attack Maternal Uncle   . Heart disease Brother   . Colon cancer Neg Hx     Past Surgical History:  Procedure Laterality Date  . ANKLE ARTHROSCOPY  2010  . JOINT REPLACEMENT  2011   rt shoulder rotator cuff repair  . knee surgeries  [id number]   x2  . SHOULDER HEMI-ARTHROPLASTY  01/12/2012   Procedure: SHOULDER HEMI-ARTHROPLASTY;  Surgeon: Nita Sells, MD;  Location: [facility];  Service: Orthopedics;  Laterality: Right;   Social History   Occupational History  . Not on file  Tobacco Use  . Smoking status: Former Smoker    Packs/day: 0.50    Years: 4.00    Pack years: 2.00    Types: Cigarettes    Quit date: 1975    Years since quitting: 46.7  . Smokeless tobacco: Never Used  Vaping Use  . Vaping Use: Never used  Substance and Sexual Activity  . Alcohol use: Yes    Alcohol/week: 1.0 - 2.0 standard drink    Types: 1 - 2 Cans of beer per week    Comment: daily  . Drug use: No  . Sexual activity: Not on file

## 2020-03-11 NOTE — Telephone Encounter (Signed)
Noted  

## 2020-03-26 ENCOUNTER — Telehealth: Payer: Self-pay | Admitting: Orthopaedic Surgery

## 2020-03-26 NOTE — Telephone Encounter (Signed)
Patient called asked if he can be worked into Dr Rudene Anda schedule 03/31/2020. Patient said his right knee is swollen and he's experiencing a lot of pain. The number to contact patient is [phone]

## 2020-03-26 NOTE — Telephone Encounter (Signed)
Spoke with patient and scheduled for Tuesday.

## 2020-03-27 ENCOUNTER — Telehealth: Payer: Self-pay

## 2020-03-27 NOTE — Telephone Encounter (Signed)
Submitted VOB, Orthovisc series, left knee.

## 2020-03-31 ENCOUNTER — Encounter: Payer: Self-pay | Admitting: Orthopaedic Surgery

## 2020-03-31 ENCOUNTER — Other Ambulatory Visit: Payer: Self-pay

## 2020-03-31 ENCOUNTER — Ambulatory Visit: Payer: PPO | Admitting: Orthopaedic Surgery

## 2020-03-31 DIAGNOSIS — L821 Other seborrheic keratosis: Secondary | ICD-10-CM | POA: Diagnosis not present

## 2020-03-31 DIAGNOSIS — M1711 Unilateral primary osteoarthritis, right knee: Secondary | ICD-10-CM

## 2020-03-31 DIAGNOSIS — M25461 Effusion, right knee: Secondary | ICD-10-CM | POA: Diagnosis not present

## 2020-03-31 DIAGNOSIS — L570 Actinic keratosis: Secondary | ICD-10-CM | POA: Diagnosis not present

## 2020-03-31 MED ORDER — BUPIVACAINE HCL 0.25 % IJ SOLN
2.0000 mL | INTRAMUSCULAR | Status: AC | PRN
  Administered 2020-03-31: 2 mL via INTRA_ARTICULAR

## 2020-03-31 MED ORDER — LIDOCAINE HCL 1 % IJ SOLN
3.0000 mL | INTRAMUSCULAR | Status: AC | PRN
  Administered 2020-03-31: 3 mL

## 2020-03-31 MED ORDER — METHYLPREDNISOLONE ACETATE 40 MG/ML IJ SUSP
80.0000 mg | INTRAMUSCULAR | Status: AC | PRN
  Administered 2020-03-31: 80 mg via INTRA_ARTICULAR

## 2020-03-31 NOTE — Progress Notes (Signed)
Office Visit Note   Patient: Bryan Hart           Date of Birth: [birth year]           MRN: [id number] Visit Date: 03/31/2020              Requested by: Sueanne Margarita, DO [address] [place],   [id number] PCP: Sueanne Margarita, DO   Assessment & Plan: Visit Diagnoses:  1. Effusion, right knee   2. Primary osteoarthritis of right knee     Plan: #1: Aspiration of the knee is about 45 cc clear yellow fluid was obtained and the knee was then injected without difficulty. #2: Use ice to the knee today #3: Limit activities over the next 24 to 48 hours.  Follow-Up Instructions: Return if symptoms worsen or fail to improve.   Face-to-face time spent with patient was greater than 30 minutes.  Greater than 50% of the time was spent in counseling and coordination of care.  Orders:  Orders Placed This Encounter  Procedures  . Large Joint Inj   No orders of the defined types were placed in this encounter.     Procedures: Large Joint Inj: R knee on 03/31/2020 3:12 PM Indications: pain and diagnostic evaluation Details: 18 G 1.5 in needle, anteromedial approach  Arthrogram: No  Medications: 80 mg methylPREDNISolone acetate 40 MG/ML; 3 mL lidocaine 1 %; 2 mL bupivacaine 0.25 % Aspirate: 45 mL yellow and blood-tinged Outcome: tolerated well, no immediate complications  45 ml aspiration Procedure, treatment alternatives, risks and benefits explained, specific risks discussed. Consent was given by the patient. Immediately prior to procedure a time out was called to verify the correct patient, procedure, equipment, support staff and site/side marked as required. Patient was prepped and draped in the usual sterile fashion.       Clinical Data: No additional findings.   Subjective: Chief Complaint  Patient presents with  . Right Knee - Pain   HPI Patient presents today for right knee pain. He was here last for a cortisone injection in the right knee in February  of this year. Patient states that he was doing well until 5days ago while at the gym trying out a new machine. He said that he has been hurting and swelling since. He feels like it may need to be aspirated and injected today.    Review of Systems  Constitutional: Negative for fatigue.  HENT: Negative for ear pain.   Eyes: Negative for pain.  Respiratory: Negative for shortness of breath.   Cardiovascular: Negative for leg swelling.  Gastrointestinal: Negative for constipation and diarrhea.  Endocrine: Negative for cold intolerance and heat intolerance.  Genitourinary: Negative for difficulty urinating.  Musculoskeletal: Positive for joint swelling.  Skin: Negative for rash.  Allergic/Immunologic: Negative for food allergies.  Neurological: Negative for weakness.  Hematological: Does not bruise/bleed easily.  Psychiatric/Behavioral: Negative for sleep disturbance.     Objective: Vital Signs: Ht 6' (1.829 m)   Wt 190 lb (86.2 kg)   BMI 25.77 kg/m   Physical Exam Constitutional:      Appearance: He is well-developed.  HENT:     Head: Normocephalic.  Eyes:     Pupils: Pupils are equal, round, and reactive to light.  Pulmonary:     Effort: Pulmonary effort is normal.  Skin:    General: Skin is warm and dry.  Neurological:     Mental Status: He is alert and oriented to person, place, and  time.  Psychiatric:        Mood and Affect: Mood normal.        Behavior: Behavior normal.     Ortho Exam  exam today reveals range of motion from near full extension to 115 degrees.  Does have crepitance with range of motion.  Mild to moderate effusion without warmth or erythema.  Patellofemoral crepitance with range of motion.  Some pseudolaxity with varus and valgus stressing.  Specialty Comments:  No specialty comments available.  Imaging: No results found.   PMFS History: Current Outpatient Medications  Medication Sig Dispense Refill  . aspirin EC 81 MG tablet Take 81 mg by  mouth daily.    Marland Kitchen atorvastatin (LIPITOR) 20 MG tablet Take 20 mg by mouth daily at 6 PM.    . Multiple Vitamin (MULTIVITAMIN) tablet Take 1 tablet by mouth daily.    . verapamil (CALAN-SR) 240 MG CR tablet Take 240 mg by mouth daily.     No current facility-administered medications for this visit.    Patient Active Problem List   Diagnosis Date Noted  . Effusion, right knee 03/31/2020  . Degenerative arthritis of right knee 03/31/2020  . Bilateral primary osteoarthritis of knee 12/27/2018  . AV block 07/29/2018  . Central sleep apnea 07/29/2018  . Cerebrovascular accident ([place]) 07/29/2018  . Primary localized osteoarthrosis, shoulder region 01/13/2012   Past Medical History:  Diagnosis Date  . HLD (hyperlipidemia)   . Hypertension    dr  ed green      stress test   12 yrs ago  . PVC's (premature ventricular contractions) 12 years ago   stress test done  . Sleep apnea   . Stroke [facility])    minor stroke 01/2018    Family History  Problem Relation Age of Onset  . Heart disease Father   . Hypertension Father   . Heart attack Brother   . Hypertension Brother   . Stroke Paternal Grandmother   . Heart attack Maternal Uncle   . Heart disease Brother   . Colon cancer Neg Hx     Past Surgical History:  Procedure Laterality Date  . ANKLE ARTHROSCOPY  2010  . JOINT REPLACEMENT  2011   rt shoulder rotator cuff repair  . knee surgeries  [id number]   x2  . SHOULDER HEMI-ARTHROPLASTY  01/12/2012   Procedure: SHOULDER HEMI-ARTHROPLASTY;  Surgeon: Nita Sells, MD;  Location: [facility];  Service: Orthopedics;  Laterality: Right;   Social History   Occupational History  . Not on file  Tobacco Use  . Smoking status: Former Smoker    Packs/day: 0.50    Years: 4.00    Pack years: 2.00    Types: Cigarettes    Quit date: 1975    Years since quitting: 46.7  . Smokeless tobacco: Never Used  Vaping Use  . Vaping Use: Never used  Substance and Sexual Activity  . Alcohol use:  Yes    Alcohol/week: 1.0 - 2.0 standard drink    Types: 1 - 2 Cans of beer per week    Comment: daily  . Drug use: No  . Sexual activity: Not on file

## 2020-04-10 ENCOUNTER — Telehealth: Payer: Self-pay

## 2020-04-10 NOTE — Telephone Encounter (Signed)
Talked with patient concerning gel injection appointment and at this time, patient would like to hold off on gel injection for left knee.  Stated that he will give [place] a call when he is ready to proceed.  Approved, Orthovisc, left knee. [place] Patient will be responsible for 20% OOP. Co-pay of $175.00 No PA required

## 2020-04-13 DIAGNOSIS — H5203 Hypermetropia, bilateral: Secondary | ICD-10-CM | POA: Diagnosis not present

## 2020-04-13 DIAGNOSIS — H25012 Cortical age-related cataract, left eye: Secondary | ICD-10-CM | POA: Diagnosis not present

## 2020-04-13 DIAGNOSIS — H43812 Vitreous degeneration, left eye: Secondary | ICD-10-CM | POA: Diagnosis not present

## 2020-04-13 DIAGNOSIS — H524 Presbyopia: Secondary | ICD-10-CM | POA: Diagnosis not present

## 2020-04-22 ENCOUNTER — Encounter: Payer: Self-pay | Admitting: Orthopaedic Surgery

## 2020-04-22 ENCOUNTER — Ambulatory Visit: Payer: PPO | Admitting: Orthopaedic Surgery

## 2020-04-22 ENCOUNTER — Other Ambulatory Visit: Payer: Self-pay

## 2020-04-22 VITALS — Ht 72.0 in | Wt 190.0 lb

## 2020-04-22 DIAGNOSIS — M1712 Unilateral primary osteoarthritis, left knee: Secondary | ICD-10-CM | POA: Diagnosis not present

## 2020-04-22 DIAGNOSIS — M170 Bilateral primary osteoarthritis of knee: Secondary | ICD-10-CM

## 2020-04-22 MED ORDER — HYALURONAN 30 MG/2ML IX SOSY
30.0000 mg | PREFILLED_SYRINGE | INTRA_ARTICULAR | Status: AC | PRN
  Administered 2020-04-22: 30 mg via INTRA_ARTICULAR

## 2020-04-22 MED ORDER — LIDOCAINE HCL 1 % IJ SOLN
2.0000 mL | INTRAMUSCULAR | Status: AC | PRN
  Administered 2020-04-22: 2 mL

## 2020-04-22 NOTE — Progress Notes (Signed)
Office Visit Note   Patient: Bryan Hart           Date of Birth: [birth year]           MRN: [id number] Visit Date: 04/22/2020              Requested by: Sueanne Margarita, DO [address] [place],  [place] [address] PCP: Sueanne Margarita, DO   Assessment & Plan: Visit Diagnoses:  1. Bilateral primary osteoarthritis of knee     Plan: First Orthovisc injection left knee.  Return weekly for the next 2 weeks to complete the series  Follow-Up Instructions: Return in about 1 week (around 04/29/2020).   Orders:  Orders Placed This Encounter  Procedures  . Large Joint Inj: L knee   No orders of the defined types were placed in this encounter.     Procedures: Large Joint Inj: L knee on 04/22/2020 9:58 AM Indications: pain and joint swelling Details: 25 G 1.5 in needle, anteromedial approach  Arthrogram: No  Medications: 2 mL lidocaine 1 %; 30 mg Hyaluronan 30 MG/2ML Aspirate: 12 mL Outcome: tolerated well, no immediate complications Procedure, treatment alternatives, risks and benefits explained, specific risks discussed. Consent was given by the patient. Immediately prior to procedure a time out was called to verify the correct patient, procedure, equipment, support staff and site/side marked as required. Patient was prepped and draped in the usual sterile fashion.       Clinical Data: No additional findings.   Subjective: Chief Complaint  Patient presents with  . Left Knee - Follow-up    Orthovisc started 04/22/2020  Patient presents today for the first orthovisc injection in his left knee.   HPI  Review of Systems   Objective: Vital Signs: Ht 6' (1.829 m)   Wt 190 lb (86.2 kg)   BMI 25.77 kg/m   Physical Exam  Ortho Exam small effusion left knee.  Mostly medial joint pain with increased varus.  Lacks a few degrees to full extension and flexed over 100 degrees without instability Specialty Comments:  No specialty comments available.  Imaging: No  results found.   PMFS History: Patient Active Problem List   Diagnosis Date Noted  . Effusion, right knee 03/31/2020  . Degenerative arthritis of right knee 03/31/2020  . Bilateral primary osteoarthritis of knee 12/27/2018  . AV block 07/29/2018  . Central sleep apnea 07/29/2018  . Cerebrovascular accident ([place]) 07/29/2018  . Primary localized osteoarthrosis, shoulder region 01/13/2012   Past Medical History:  Diagnosis Date  . HLD (hyperlipidemia)   . Hypertension    dr  ed green      stress test   12 yrs ago  . PVC's (premature ventricular contractions) 12 years ago   stress test done  . Sleep apnea   . Stroke [facility])    minor stroke 01/2018    Family History  Problem Relation Age of Onset  . Heart disease Father   . Hypertension Father   . Heart attack Brother   . Hypertension Brother   . Stroke Paternal Grandmother   . Heart attack Maternal Uncle   . Heart disease Brother   . Colon cancer Neg Hx     Past Surgical History:  Procedure Laterality Date  . ANKLE ARTHROSCOPY  2010  . JOINT REPLACEMENT  2011   rt shoulder rotator cuff repair  . knee surgeries  [id number]   x2  . SHOULDER HEMI-ARTHROPLASTY  01/12/2012   Procedure: SHOULDER HEMI-ARTHROPLASTY;  Surgeon: Nita Sells, MD;  Location: [facility];  Service: Orthopedics;  Laterality: Right;   Social History   Occupational History  . Not on file  Tobacco Use  . Smoking status: Former Smoker    Packs/day: 0.50    Years: 4.00    Pack years: 2.00    Types: Cigarettes    Quit date: 1975    Years since quitting: 46.8  . Smokeless tobacco: Never Used  Vaping Use  . Vaping Use: Never used  Substance and Sexual Activity  . Alcohol use: Yes    Alcohol/week: 1.0 - 2.0 standard drink    Types: 1 - 2 Cans of beer per week    Comment: daily  . Drug use: No  . Sexual activity: Not on file

## 2020-04-30 ENCOUNTER — Other Ambulatory Visit: Payer: Self-pay

## 2020-04-30 ENCOUNTER — Ambulatory Visit (INDEPENDENT_AMBULATORY_CARE_PROVIDER_SITE_OTHER): Payer: PPO | Admitting: Orthopaedic Surgery

## 2020-04-30 ENCOUNTER — Encounter: Payer: Self-pay | Admitting: Orthopaedic Surgery

## 2020-04-30 DIAGNOSIS — M1712 Unilateral primary osteoarthritis, left knee: Secondary | ICD-10-CM | POA: Diagnosis not present

## 2020-04-30 MED ORDER — LIDOCAINE HCL 1 % IJ SOLN
2.0000 mL | INTRAMUSCULAR | Status: AC | PRN
  Administered 2020-04-30: 2 mL

## 2020-04-30 MED ORDER — HYALURONAN 30 MG/2ML IX SOSY
30.0000 mg | PREFILLED_SYRINGE | INTRA_ARTICULAR | Status: AC | PRN
  Administered 2020-04-30: 30 mg via INTRA_ARTICULAR

## 2020-04-30 NOTE — Progress Notes (Signed)
Office Visit Note   Patient: Bryan Hart           Date of Birth: 02/14/1951           MRN: [id number] Visit Date: 04/30/2020              Requested by: Sueanne Margarita, DO [address] [place],  [place] [address] PCP: Sueanne Margarita, DO   Assessment & Plan: Visit Diagnoses:  1. Primary osteoarthritis of left knee     Plan:  #1: Second Orthovisc was given without difficulty to the left knee. #2: Follow back up 1 week for the third injection  Follow-Up Instructions: Return in about 1 week (around 05/07/2020).   Orders:  No orders of the defined types were placed in this encounter.  No orders of the defined types were placed in this encounter.     Procedures: Large Joint Inj: L knee on 04/30/2020 1:43 PM Indications: pain and joint swelling Details: 25 G 1.5 in needle, anteromedial approach  Arthrogram: No  Medications: 2 mL lidocaine 1 %; 30 mg Hyaluronan 30 MG/2ML Outcome: tolerated well, no immediate complications Procedure, treatment alternatives, risks and benefits explained, specific risks discussed. Consent was given by the patient. Immediately prior to procedure a time out was called to verify the correct patient, procedure, equipment, support staff and site/side marked as required. Patient was prepped and draped in the usual sterile fashion.       Clinical Data: No additional findings.   Subjective: Chief Complaint  Patient presents with   Left Knee - Follow-up    Orthovisc  Patient presents today for the second Orthovisc injection in his left knee. He started the series on 04/22/2020.  HPI  He did have a Orthovisc injection 1 week ago and did not have any difficulties with it.  Seen today for his second injection.  Review of Systems   Objective: Vital Signs: Ht 6' (1.829 m)    Wt 190 lb (86.2 kg)    BMI 25.77 kg/m   Physical Exam  Ortho Exam  Exam today reveals the need to be quite benign.  No effusion.  No warmth or  erythema.  Specialty Comments:  No specialty comments available.  Imaging: No results found.   PMFS History: Patient Active Problem List   Diagnosis Date Noted   Primary osteoarthritis of left knee 04/30/2020   Effusion, right knee 03/31/2020   Degenerative arthritis of right knee 03/31/2020   Bilateral primary osteoarthritis of knee 12/27/2018   AV block 07/29/2018   Central sleep apnea 07/29/2018   Cerebrovascular accident [facility]) 07/29/2018   Primary localized osteoarthrosis, shoulder region 01/13/2012   Past Medical History:  Diagnosis Date   HLD (hyperlipidemia)    Hypertension    dr  ed green      stress test   12 yrs ago   PVC's (premature ventricular contractions) 12 years ago   stress test done   Sleep apnea    Stroke [facility])    minor stroke 01/2018    Family History  Problem Relation Age of Onset   Heart disease Father    Hypertension Father    Heart attack Brother    Hypertension Brother    Stroke Paternal Grandmother    Heart attack Maternal Uncle    Heart disease Brother    Colon cancer Neg Hx     Past Surgical History:  Procedure Laterality Date   ANKLE ARTHROSCOPY  2010   JOINT REPLACEMENT  2011   rt shoulder rotator cuff repair   knee surgeries  [id number]   x2   SHOULDER HEMI-ARTHROPLASTY  01/12/2012   Procedure: SHOULDER HEMI-ARTHROPLASTY;  Surgeon: Nita Sells, MD;  Location: [place];  Service: Orthopedics;  Laterality: Right;   Social History   Occupational History   Not on file  Tobacco Use   Smoking status: Former Smoker    Packs/day: 0.50    Years: 4.00    Pack years: 2.00    Types: Cigarettes    Quit date: 1975    Years since quitting: 46.8   Smokeless tobacco: Never Used  Scientific laboratory technician Use: Never used  Substance and Sexual Activity   Alcohol use: Yes    Alcohol/week: 1.0 - 2.0 standard drink    Types: 1 - 2 Cans of beer per week    Comment: daily   Drug use: No   Sexual  activity: Not on file

## 2020-05-06 ENCOUNTER — Encounter: Payer: Self-pay | Admitting: Orthopaedic Surgery

## 2020-05-06 ENCOUNTER — Other Ambulatory Visit: Payer: Self-pay

## 2020-05-06 ENCOUNTER — Ambulatory Visit (INDEPENDENT_AMBULATORY_CARE_PROVIDER_SITE_OTHER): Payer: PPO | Admitting: Orthopaedic Surgery

## 2020-05-06 ENCOUNTER — Ambulatory Visit: Payer: PPO | Admitting: Orthopaedic Surgery

## 2020-05-06 VITALS — Ht 72.0 in | Wt 190.0 lb

## 2020-05-06 DIAGNOSIS — M1712 Unilateral primary osteoarthritis, left knee: Secondary | ICD-10-CM | POA: Diagnosis not present

## 2020-05-06 MED ORDER — HYALURONAN 30 MG/2ML IX SOSY
30.0000 mg | PREFILLED_SYRINGE | INTRA_ARTICULAR | Status: AC | PRN
  Administered 2020-05-06: 30 mg via INTRA_ARTICULAR

## 2020-05-06 NOTE — Progress Notes (Signed)
Office Visit Note   Patient: Bryan Hart           Date of Birth: [birth year]           MRN: [id number] Visit Date: 05/06/2020              Requested by: Sueanne Margarita, DO [address],  [place] [address] PCP: Sueanne Margarita, DO   Assessment & Plan: Visit Diagnoses:  1. Primary osteoarthritis of left knee     Plan: Third Orthovisc injection left knee.  Might have "some" improvement "from the initial 2 injections.  Long discussion regarding future treatment options including knee replacement.  Return as needed  Follow-Up Instructions: Return if symptoms worsen or fail to improve.   Orders:  Orders Placed This Encounter  Procedures  . Large Joint Inj: L knee   No orders of the defined types were placed in this encounter.     Procedures: Large Joint Inj: L knee on 05/06/2020 9:59 AM Indications: pain and joint swelling Details: 25 G 1.5 in needle, anteromedial approach  Arthrogram: No  Medications: 30 mg Hyaluronan 30 MG/2ML Outcome: tolerated well, no immediate complications Procedure, treatment alternatives, risks and benefits explained, specific risks discussed. Consent was given by the patient. Immediately prior to procedure a time out was called to verify the correct patient, procedure, equipment, support staff and site/side marked as required. Patient was prepped and draped in the usual sterile fashion.       Clinical Data: No additional findings.   Subjective: Chief Complaint  Patient presents with  . Left Knee - Follow-up    orthovisc injection started 04/22/2020  Patient presents today for the third Orthovisc injection in his left knee. He started the series on 04/22/2020. No improvement thus far.   HPI  Review of Systems   Objective: Vital Signs: Ht 6' (1.829 m)   Wt 190 lb (86.2 kg)   BMI 25.77 kg/m   Physical Exam  Ortho Exam left knee was not hot warm or red.  Increased varus based on prior x-rays consistent with  end-stage arthritis predominate the medial compartment.  Motor exam intact  Specialty Comments:  No specialty comments available.  Imaging: No results found.   PMFS History: Patient Active Problem List   Diagnosis Date Noted  . Primary osteoarthritis of left knee 04/30/2020  . Effusion, right knee 03/31/2020  . Degenerative arthritis of right knee 03/31/2020  . Bilateral primary osteoarthritis of knee 12/27/2018  . AV block 07/29/2018  . Central sleep apnea 07/29/2018  . Cerebrovascular accident ([place]) 07/29/2018  . Primary localized osteoarthrosis, shoulder region 01/13/2012   Past Medical History:  Diagnosis Date  . HLD (hyperlipidemia)   . Hypertension    dr  ed green      stress test   12 yrs ago  . PVC's (premature ventricular contractions) 12 years ago   stress test done  . Sleep apnea   . Stroke [facility])    minor stroke 01/2018    Family History  Problem Relation Age of Onset  . Heart disease Father   . Hypertension Father   . Heart attack Brother   . Hypertension Brother   . Stroke Paternal Grandmother   . Heart attack Maternal Uncle   . Heart disease Brother   . Colon cancer Neg Hx     Past Surgical History:  Procedure Laterality Date  . ANKLE ARTHROSCOPY  2010  . JOINT REPLACEMENT  2011   rt  shoulder rotator cuff repair  . knee surgeries  [id number]   x2  . SHOULDER HEMI-ARTHROPLASTY  01/12/2012   Procedure: SHOULDER HEMI-ARTHROPLASTY;  Surgeon: Nita Sells, MD;  Location: [place];  Service: Orthopedics;  Laterality: Right;   Social History   Occupational History  . Not on file  Tobacco Use  . Smoking status: Former Smoker    Packs/day: 0.50    Years: 4.00    Pack years: 2.00    Types: Cigarettes    Quit date: 1975    Years since quitting: 46.8  . Smokeless tobacco: Never Used  Vaping Use  . Vaping Use: Never used  Substance and Sexual Activity  . Alcohol use: Yes    Alcohol/week: 1.0 - 2.0 standard drink    Types: 1 - 2 Cans of beer  per week    Comment: daily  . Drug use: No  . Sexual activity: Not on file

## 2020-06-12 DIAGNOSIS — E785 Hyperlipidemia, unspecified: Secondary | ICD-10-CM | POA: Diagnosis not present

## 2020-06-12 DIAGNOSIS — Z125 Encounter for screening for malignant neoplasm of prostate: Secondary | ICD-10-CM | POA: Diagnosis not present

## 2020-06-17 DIAGNOSIS — G4733 Obstructive sleep apnea (adult) (pediatric): Secondary | ICD-10-CM | POA: Diagnosis not present

## 2020-06-17 DIAGNOSIS — K219 Gastro-esophageal reflux disease without esophagitis: Secondary | ICD-10-CM | POA: Diagnosis not present

## 2020-06-17 DIAGNOSIS — E785 Hyperlipidemia, unspecified: Secondary | ICD-10-CM | POA: Diagnosis not present

## 2020-06-17 DIAGNOSIS — R82998 Other abnormal findings in urine: Secondary | ICD-10-CM | POA: Diagnosis not present

## 2020-06-17 DIAGNOSIS — Z0000 Encounter for general adult medical examination without abnormal findings: Secondary | ICD-10-CM | POA: Diagnosis not present

## 2020-06-17 DIAGNOSIS — I10 Essential (primary) hypertension: Secondary | ICD-10-CM | POA: Diagnosis not present

## 2020-06-17 DIAGNOSIS — Z8673 Personal history of transient ischemic attack (TIA), and cerebral infarction without residual deficits: Secondary | ICD-10-CM | POA: Diagnosis not present

## 2020-06-17 DIAGNOSIS — Z1212 Encounter for screening for malignant neoplasm of rectum: Secondary | ICD-10-CM | POA: Diagnosis not present

## 2020-06-17 DIAGNOSIS — I4430 Unspecified atrioventricular block: Secondary | ICD-10-CM | POA: Diagnosis not present

## 2020-06-17 DIAGNOSIS — E663 Overweight: Secondary | ICD-10-CM | POA: Diagnosis not present

## 2020-07-02 DIAGNOSIS — D1721 Benign lipomatous neoplasm of skin and subcutaneous tissue of right arm: Secondary | ICD-10-CM | POA: Diagnosis not present

## 2020-07-02 DIAGNOSIS — L570 Actinic keratosis: Secondary | ICD-10-CM | POA: Diagnosis not present

## 2020-07-02 DIAGNOSIS — I788 Other diseases of capillaries: Secondary | ICD-10-CM | POA: Diagnosis not present

## 2020-07-02 DIAGNOSIS — L918 Other hypertrophic disorders of the skin: Secondary | ICD-10-CM | POA: Diagnosis not present

## 2020-07-02 DIAGNOSIS — D1801 Hemangioma of skin and subcutaneous tissue: Secondary | ICD-10-CM | POA: Diagnosis not present

## 2020-07-02 DIAGNOSIS — L821 Other seborrheic keratosis: Secondary | ICD-10-CM | POA: Diagnosis not present

## 2020-07-02 DIAGNOSIS — D1722 Benign lipomatous neoplasm of skin and subcutaneous tissue of left arm: Secondary | ICD-10-CM | POA: Diagnosis not present

## 2020-07-02 DIAGNOSIS — D225 Melanocytic nevi of trunk: Secondary | ICD-10-CM | POA: Diagnosis not present

## 2020-09-21 ENCOUNTER — Telehealth: Payer: Self-pay | Admitting: Orthopaedic Surgery

## 2020-09-21 NOTE — Telephone Encounter (Signed)
Pt called and says he's having a lot of pain in his left knee. He states that he can barley walk and would really like if you could squeeze him in as soon as possible tomorrow! Cb [phone]

## 2020-09-21 NOTE — Telephone Encounter (Signed)
Called patient. Scheduled for tomorrow.

## 2020-09-22 ENCOUNTER — Ambulatory Visit: Payer: PPO | Admitting: Orthopaedic Surgery

## 2020-09-22 ENCOUNTER — Encounter: Payer: Self-pay | Admitting: Orthopaedic Surgery

## 2020-09-22 ENCOUNTER — Other Ambulatory Visit: Payer: Self-pay

## 2020-09-22 VITALS — Ht 72.0 in | Wt 190.0 lb

## 2020-09-22 DIAGNOSIS — M1712 Unilateral primary osteoarthritis, left knee: Secondary | ICD-10-CM

## 2020-09-22 DIAGNOSIS — M170 Bilateral primary osteoarthritis of knee: Secondary | ICD-10-CM

## 2020-09-22 MED ORDER — LIDOCAINE HCL 1 % IJ SOLN
2.0000 mL | INTRAMUSCULAR | Status: AC | PRN
  Administered 2020-09-22: 2 mL

## 2020-09-22 MED ORDER — BUPIVACAINE HCL 0.25 % IJ SOLN
4.0000 mL | INTRAMUSCULAR | Status: AC | PRN
  Administered 2020-09-22: 4 mL via INTRA_ARTICULAR

## 2020-09-22 NOTE — Progress Notes (Signed)
Office Visit Note   Patient: Bryan Hart           Date of Birth: 12-06-1950           MRN: [id number] Visit Date: 09/22/2020              Requested by: Sueanne Margarita, DO [address],  [place] [address] PCP: Sueanne Margarita, DO   Assessment & Plan: Visit Diagnoses:  1. Bilateral primary osteoarthritis of knee     Plan: Recurrent pain and effusion left knee related to the arthritis.  I aspirated 35 cc of fluid and injected betamethasone.  Long discussion regarding knee replacement surgery.  Lots to think about.  He will let me know  Follow-Up Instructions: Return if symptoms worsen or fail to improve.   Orders:  Orders Placed This Encounter  Procedures  . Large Joint Inj: L knee   No orders of the defined types were placed in this encounter.     Procedures: Large Joint Inj: L knee on 09/22/2020 4:06 PM Indications: pain and diagnostic evaluation Details: 25 G 1.5 in needle, anteromedial approach  Arthrogram: No  Medications: 2 mL lidocaine 1 %; 4 mL bupivacaine 0.25 % Aspirate: 35 mL serous Outcome: tolerated well, no immediate complications  12 mg betamethasone injected into left knee medial compartment with Xylocaine and Marcaine.  Performed after knee aspiration of 35 cc of fluid Procedure, treatment alternatives, risks and benefits explained, specific risks discussed. Consent was given by the patient. Patient was prepped and draped in the usual sterile fashion.       Clinical Data: No additional findings.   Subjective: Chief Complaint  Patient presents with  . Left Knee - Follow-up, Pain  Patient presents today for his chronic left knee pain. He finished a series of Orthovisc in his knee in November of last year. He states that the orthovisc injections did not help. He has pain upon getting up after sitting. He states that his pain woke him up this past weekend. He has taken over the counter medicine, but usually does not require it.  Has  bilateral knee pain but presently having more trouble with the left.  He does have some feeling of instability on the right related to his arthritis.  Prior films have demonstrated significant arthritis particularly in the medial compartment with near bone-on-bone  HPI  Review of Systems   Objective: Vital Signs: Ht 6' (1.829 m)   Wt 190 lb (86.2 kg)   BMI 25.77 kg/m   Physical Exam Constitutional:      Appearance: He is well-developed.  Eyes:     Pupils: Pupils are equal, round, and reactive to light.  Pulmonary:     Effort: Pulmonary effort is normal.  Skin:    General: Skin is warm and dry.  Neurological:     Mental Status: He is alert and oriented to person, place, and time.  Psychiatric:        Behavior: Behavior normal.     Ortho Exam left knee with positive effusion.  The knee was not hot warm or red.  Increased varus.  Lacks a few degrees to full extension of flexed over 100 degrees without instability.  No popliteal pain or mass.  No calf pain.  Neurologically intact Specialty Comments:  No specialty comments available.  Imaging: No results found.   PMFS History: Patient Active Problem List   Diagnosis Date Noted  . Primary osteoarthritis of left knee 04/30/2020  .  Effusion, right knee 03/31/2020  . Degenerative arthritis of right knee 03/31/2020  . Bilateral primary osteoarthritis of knee 12/27/2018  . AV block 07/29/2018  . Central sleep apnea 07/29/2018  . Cerebrovascular accident ([place]) 07/29/2018  . Primary localized osteoarthrosis, shoulder region 01/13/2012   Past Medical History:  Diagnosis Date  . HLD (hyperlipidemia)   . Hypertension    dr  ed green      stress test   12 yrs ago  . PVC's (premature ventricular contractions) 12 years ago   stress test done  . Sleep apnea   . Stroke [facility])    minor stroke 01/2018    Family History  Problem Relation Age of Onset  . Heart disease Father   . Hypertension Father   . Heart attack Brother   .  Hypertension Brother   . Stroke Paternal Grandmother   . Heart attack Maternal Uncle   . Heart disease Brother   . Colon cancer Neg Hx     Past Surgical History:  Procedure Laterality Date  . ANKLE ARTHROSCOPY  2010  . JOINT REPLACEMENT  2011   rt shoulder rotator cuff repair  . knee surgeries  [id number]   x2  . SHOULDER HEMI-ARTHROPLASTY  01/12/2012   Procedure: SHOULDER HEMI-ARTHROPLASTY;  Surgeon: Nita Sells, MD;  Location: [place];  Service: Orthopedics;  Laterality: Right;   Social History   Occupational History  . Not on file  Tobacco Use  . Smoking status: Former Smoker    Packs/day: 0.50    Years: 4.00    Pack years: 2.00    Types: Cigarettes    Quit date: 1975    Years since quitting: 47.2  . Smokeless tobacco: Never Used  Vaping Use  . Vaping Use: Never used  Substance and Sexual Activity  . Alcohol use: Yes    Alcohol/week: 1.0 - 2.0 standard drink    Types: 1 - 2 Cans of beer per week    Comment: daily  . Drug use: No  . Sexual activity: Not on file

## 2020-12-21 DIAGNOSIS — I4430 Unspecified atrioventricular block: Secondary | ICD-10-CM | POA: Diagnosis not present

## 2020-12-21 DIAGNOSIS — E785 Hyperlipidemia, unspecified: Secondary | ICD-10-CM | POA: Diagnosis not present

## 2020-12-21 DIAGNOSIS — I10 Essential (primary) hypertension: Secondary | ICD-10-CM | POA: Diagnosis not present

## 2020-12-21 DIAGNOSIS — R634 Abnormal weight loss: Secondary | ICD-10-CM | POA: Diagnosis not present

## 2020-12-21 DIAGNOSIS — G4733 Obstructive sleep apnea (adult) (pediatric): Secondary | ICD-10-CM | POA: Diagnosis not present

## 2020-12-21 DIAGNOSIS — Z8673 Personal history of transient ischemic attack (TIA), and cerebral infarction without residual deficits: Secondary | ICD-10-CM | POA: Diagnosis not present

## 2020-12-21 DIAGNOSIS — R413 Other amnesia: Secondary | ICD-10-CM | POA: Diagnosis not present

## 2020-12-21 DIAGNOSIS — E663 Overweight: Secondary | ICD-10-CM | POA: Diagnosis not present

## 2020-12-21 DIAGNOSIS — M170 Bilateral primary osteoarthritis of knee: Secondary | ICD-10-CM | POA: Diagnosis not present

## 2020-12-21 DIAGNOSIS — K219 Gastro-esophageal reflux disease without esophagitis: Secondary | ICD-10-CM | POA: Diagnosis not present

## 2020-12-30 DIAGNOSIS — R269 Unspecified abnormalities of gait and mobility: Secondary | ICD-10-CM | POA: Diagnosis not present

## 2020-12-30 DIAGNOSIS — M25562 Pain in left knee: Secondary | ICD-10-CM | POA: Diagnosis not present

## 2020-12-30 DIAGNOSIS — M25561 Pain in right knee: Secondary | ICD-10-CM | POA: Diagnosis not present

## 2020-12-30 DIAGNOSIS — M170 Bilateral primary osteoarthritis of knee: Secondary | ICD-10-CM | POA: Diagnosis not present

## 2021-01-05 DIAGNOSIS — M25562 Pain in left knee: Secondary | ICD-10-CM | POA: Diagnosis not present

## 2021-01-05 DIAGNOSIS — M170 Bilateral primary osteoarthritis of knee: Secondary | ICD-10-CM | POA: Diagnosis not present

## 2021-01-05 DIAGNOSIS — M25561 Pain in right knee: Secondary | ICD-10-CM | POA: Diagnosis not present

## 2021-01-05 DIAGNOSIS — R269 Unspecified abnormalities of gait and mobility: Secondary | ICD-10-CM | POA: Diagnosis not present

## 2021-01-07 DIAGNOSIS — M25562 Pain in left knee: Secondary | ICD-10-CM | POA: Diagnosis not present

## 2021-01-07 DIAGNOSIS — M170 Bilateral primary osteoarthritis of knee: Secondary | ICD-10-CM | POA: Diagnosis not present

## 2021-01-07 DIAGNOSIS — R269 Unspecified abnormalities of gait and mobility: Secondary | ICD-10-CM | POA: Diagnosis not present

## 2021-01-07 DIAGNOSIS — M25561 Pain in right knee: Secondary | ICD-10-CM | POA: Diagnosis not present

## 2021-01-12 DIAGNOSIS — M25561 Pain in right knee: Secondary | ICD-10-CM | POA: Diagnosis not present

## 2021-01-12 DIAGNOSIS — M170 Bilateral primary osteoarthritis of knee: Secondary | ICD-10-CM | POA: Diagnosis not present

## 2021-01-12 DIAGNOSIS — R269 Unspecified abnormalities of gait and mobility: Secondary | ICD-10-CM | POA: Diagnosis not present

## 2021-01-12 DIAGNOSIS — M25562 Pain in left knee: Secondary | ICD-10-CM | POA: Diagnosis not present

## 2021-01-14 DIAGNOSIS — M170 Bilateral primary osteoarthritis of knee: Secondary | ICD-10-CM | POA: Diagnosis not present

## 2021-01-14 DIAGNOSIS — R269 Unspecified abnormalities of gait and mobility: Secondary | ICD-10-CM | POA: Diagnosis not present

## 2021-01-14 DIAGNOSIS — M25561 Pain in right knee: Secondary | ICD-10-CM | POA: Diagnosis not present

## 2021-01-14 DIAGNOSIS — M25562 Pain in left knee: Secondary | ICD-10-CM | POA: Diagnosis not present

## 2021-01-18 DIAGNOSIS — M25562 Pain in left knee: Secondary | ICD-10-CM | POA: Diagnosis not present

## 2021-01-18 DIAGNOSIS — M25561 Pain in right knee: Secondary | ICD-10-CM | POA: Diagnosis not present

## 2021-01-18 DIAGNOSIS — R269 Unspecified abnormalities of gait and mobility: Secondary | ICD-10-CM | POA: Diagnosis not present

## 2021-01-18 DIAGNOSIS — M170 Bilateral primary osteoarthritis of knee: Secondary | ICD-10-CM | POA: Diagnosis not present

## 2021-01-20 DIAGNOSIS — M170 Bilateral primary osteoarthritis of knee: Secondary | ICD-10-CM | POA: Diagnosis not present

## 2021-01-20 DIAGNOSIS — M25561 Pain in right knee: Secondary | ICD-10-CM | POA: Diagnosis not present

## 2021-01-20 DIAGNOSIS — M25562 Pain in left knee: Secondary | ICD-10-CM | POA: Diagnosis not present

## 2021-01-20 DIAGNOSIS — R269 Unspecified abnormalities of gait and mobility: Secondary | ICD-10-CM | POA: Diagnosis not present

## 2021-01-27 DIAGNOSIS — M25561 Pain in right knee: Secondary | ICD-10-CM | POA: Diagnosis not present

## 2021-01-27 DIAGNOSIS — R269 Unspecified abnormalities of gait and mobility: Secondary | ICD-10-CM | POA: Diagnosis not present

## 2021-01-27 DIAGNOSIS — M25562 Pain in left knee: Secondary | ICD-10-CM | POA: Diagnosis not present

## 2021-01-27 DIAGNOSIS — M170 Bilateral primary osteoarthritis of knee: Secondary | ICD-10-CM | POA: Diagnosis not present

## 2021-02-01 DIAGNOSIS — M25561 Pain in right knee: Secondary | ICD-10-CM | POA: Diagnosis not present

## 2021-02-01 DIAGNOSIS — M25562 Pain in left knee: Secondary | ICD-10-CM | POA: Diagnosis not present

## 2021-02-01 DIAGNOSIS — M170 Bilateral primary osteoarthritis of knee: Secondary | ICD-10-CM | POA: Diagnosis not present

## 2021-02-01 DIAGNOSIS — R269 Unspecified abnormalities of gait and mobility: Secondary | ICD-10-CM | POA: Diagnosis not present

## 2021-02-08 DIAGNOSIS — R269 Unspecified abnormalities of gait and mobility: Secondary | ICD-10-CM | POA: Diagnosis not present

## 2021-02-08 DIAGNOSIS — M25562 Pain in left knee: Secondary | ICD-10-CM | POA: Diagnosis not present

## 2021-02-08 DIAGNOSIS — M170 Bilateral primary osteoarthritis of knee: Secondary | ICD-10-CM | POA: Diagnosis not present

## 2021-02-08 DIAGNOSIS — M25561 Pain in right knee: Secondary | ICD-10-CM | POA: Diagnosis not present

## 2021-02-16 DIAGNOSIS — R269 Unspecified abnormalities of gait and mobility: Secondary | ICD-10-CM | POA: Diagnosis not present

## 2021-02-16 DIAGNOSIS — M170 Bilateral primary osteoarthritis of knee: Secondary | ICD-10-CM | POA: Diagnosis not present

## 2021-02-16 DIAGNOSIS — M25561 Pain in right knee: Secondary | ICD-10-CM | POA: Diagnosis not present

## 2021-02-16 DIAGNOSIS — M25562 Pain in left knee: Secondary | ICD-10-CM | POA: Diagnosis not present

## 2021-02-18 DIAGNOSIS — T148XXA Other injury of unspecified body region, initial encounter: Secondary | ICD-10-CM | POA: Diagnosis not present

## 2021-02-28 DIAGNOSIS — B372 Candidiasis of skin and nail: Secondary | ICD-10-CM | POA: Diagnosis not present

## 2021-03-02 DIAGNOSIS — H9193 Unspecified hearing loss, bilateral: Secondary | ICD-10-CM | POA: Diagnosis not present

## 2021-03-02 DIAGNOSIS — H6123 Impacted cerumen, bilateral: Secondary | ICD-10-CM | POA: Diagnosis not present

## 2021-03-02 DIAGNOSIS — R21 Rash and other nonspecific skin eruption: Secondary | ICD-10-CM | POA: Diagnosis not present

## 2021-04-13 ENCOUNTER — Ambulatory Visit: Payer: PPO | Admitting: Orthopaedic Surgery

## 2021-04-13 ENCOUNTER — Other Ambulatory Visit: Payer: Self-pay

## 2021-04-13 ENCOUNTER — Encounter: Payer: Self-pay | Admitting: Orthopaedic Surgery

## 2021-04-13 DIAGNOSIS — M170 Bilateral primary osteoarthritis of knee: Secondary | ICD-10-CM

## 2021-04-13 NOTE — Progress Notes (Signed)
Office Visit Note   Patient: Bryan Hart           Date of Birth: 06-03-1951           MRN: [id number] Visit Date: 04/13/2021              Requested by: Bryan Hart, [address],  [place] [address] PCP: Bryan Margarita, DO   Assessment & Plan: Visit Diagnoses:  1. Bilateral primary osteoarthritis of knee     Plan: Mr Bryan Hart has an established diagnosis of bilateral knee osteoarthritis.  The changes are more profound in the right knee.  On this occasion he has been having more trouble on the left.  He had a catching sensation with significant pain for several days starting last week but since has resolved.  He said little bit of an effusion.  He just want to talk about his knee and his options.  He had films of both knees in 2021 demonstrating end-stage osteoarthritis.  There were less changes on the left than the right.  He has had multiple injections in the past with temporary relief.  He is still thinking about a knee replacement but is not sure that he is "quite ready".  Today his left knee was minimally effused was not terribly uncomfortable and I thought it was not worthwhile to inject his knee with cortisone.  Answered all of his questions and plan to see him back as needed.  We did have a long discussion regarding knee replacement and what he may expect.  Follow-Up Instructions: Return if symptoms worsen or fail to improve.   Orders:  No orders of the defined types were placed in this encounter.  No orders of the defined types were placed in this encounter.     Procedures: No procedures performed   Clinical Data: No additional findings.   Subjective: Chief Complaint  Patient presents with   Left Knee - Pain  Patient presents today for his left knee. He had it injected last in march of this year. He said that his knee has been doing well until 6days ago. He pivoted in the house and twisted his knee. He had immediate pain and could hear an audible pop with  walking. He said that that the following day his knee had returned to normal again.   HPI  Review of Systems   Objective: Vital Signs: There were no vitals taken for this visit.  Physical Exam Constitutional:      Appearance: He is well-developed.  Eyes:     Pupils: Pupils are equal, round, and reactive to light.  Pulmonary:     Effort: Pulmonary effort is normal.  Skin:    General: Skin is warm and dry.  Neurological:     Mental Status: He is alert and oriented to person, place, and time.  Psychiatric:        Behavior: Behavior normal.    Ortho Exam left knee was not hot red but minimally warm.  Very small effusion.  He lacks maybe 5 degrees to full extension of flexed about 100 degrees.  Hypertrophic changes more medial to lateral ends slight varus.  Had a popliteal cyst but no pain no calf discomfort.  Neurologically intact.  Positive crepitation about the patella with flexion extension and very mild medial joint pain  Specialty Comments:  No specialty comments available.  Imaging: No results found.   PMFS History: Patient Active Problem List   Diagnosis Date Noted   Primary  osteoarthritis of left knee 04/30/2020   Effusion, right knee 03/31/2020   Degenerative arthritis of right knee 03/31/2020   Bilateral primary osteoarthritis of knee 12/27/2018   AV block 07/29/2018   Central sleep apnea 07/29/2018   Cerebrovascular accident [facility]) 07/29/2018   Primary localized osteoarthrosis, shoulder region 01/13/2012   Past Medical History:  Diagnosis Date   HLD (hyperlipidemia)    Hypertension    dr  ed green      stress test   12 yrs ago   PVC's (premature ventricular contractions) 12 years ago   stress test done   Sleep apnea    Stroke [facility])    minor stroke 01/2018    Family History  Problem Relation Age of Onset   Heart disease Father    Hypertension Father    Heart attack Brother    Hypertension Brother    Stroke Paternal Grandmother    Heart attack  Maternal Uncle    Heart disease Brother    Colon cancer Neg Hx     Past Surgical History:  Procedure Laterality Date   ANKLE ARTHROSCOPY  2010   JOINT REPLACEMENT  2011   rt shoulder rotator cuff repair   knee surgeries  [id number]   x2   SHOULDER HEMI-ARTHROPLASTY  01/12/2012   Procedure: SHOULDER HEMI-ARTHROPLASTY;  Surgeon: Nita Sells, MD;  Location: [place];  Service: Orthopedics;  Laterality: Right;   Social History   Occupational History   Not on file  Tobacco Use   Smoking status: Former    Packs/day: 0.50    Years: 4.00    Pack years: 2.00    Types: Cigarettes    Quit date: 1975    Years since quitting: 47.8   Smokeless tobacco: Never  Vaping Use   Vaping Use: Never used  Substance and Sexual Activity   Alcohol use: Yes    Alcohol/week: 1.0 - 2.0 standard drink    Types: 1 - 2 Cans of beer per week    Comment: daily   Drug use: No   Sexual activity: Not on file

## 2021-05-11 DIAGNOSIS — H6121 Impacted cerumen, right ear: Secondary | ICD-10-CM | POA: Diagnosis not present

## 2021-05-11 DIAGNOSIS — H9193 Unspecified hearing loss, bilateral: Secondary | ICD-10-CM | POA: Diagnosis not present

## 2021-05-11 DIAGNOSIS — H6983 Other specified disorders of Eustachian tube, bilateral: Secondary | ICD-10-CM | POA: Diagnosis not present

## 2021-05-26 DIAGNOSIS — M25512 Pain in left shoulder: Secondary | ICD-10-CM | POA: Diagnosis not present

## 2021-06-16 DIAGNOSIS — H2513 Age-related nuclear cataract, bilateral: Secondary | ICD-10-CM | POA: Diagnosis not present

## 2021-06-16 DIAGNOSIS — H524 Presbyopia: Secondary | ICD-10-CM | POA: Diagnosis not present

## 2021-06-16 DIAGNOSIS — M25512 Pain in left shoulder: Secondary | ICD-10-CM | POA: Diagnosis not present

## 2021-06-16 DIAGNOSIS — H43813 Vitreous degeneration, bilateral: Secondary | ICD-10-CM | POA: Diagnosis not present

## 2021-06-16 DIAGNOSIS — H25013 Cortical age-related cataract, bilateral: Secondary | ICD-10-CM | POA: Diagnosis not present

## 2021-06-23 ENCOUNTER — Other Ambulatory Visit: Payer: Self-pay | Admitting: Orthopedic Surgery

## 2021-06-23 DIAGNOSIS — E785 Hyperlipidemia, unspecified: Secondary | ICD-10-CM | POA: Diagnosis not present

## 2021-06-23 DIAGNOSIS — Z125 Encounter for screening for malignant neoplasm of prostate: Secondary | ICD-10-CM | POA: Diagnosis not present

## 2021-06-23 DIAGNOSIS — I10 Essential (primary) hypertension: Secondary | ICD-10-CM | POA: Diagnosis not present

## 2021-06-23 DIAGNOSIS — M25512 Pain in left shoulder: Secondary | ICD-10-CM

## 2021-06-24 DIAGNOSIS — E785 Hyperlipidemia, unspecified: Secondary | ICD-10-CM | POA: Diagnosis not present

## 2021-06-24 DIAGNOSIS — I10 Essential (primary) hypertension: Secondary | ICD-10-CM | POA: Diagnosis not present

## 2021-06-27 DIAGNOSIS — T8859XA Other complications of anesthesia, initial encounter: Secondary | ICD-10-CM

## 2021-06-27 HISTORY — DX: Other complications of anesthesia, initial encounter: T88.59XA

## 2021-06-30 DIAGNOSIS — Z1339 Encounter for screening examination for other mental health and behavioral disorders: Secondary | ICD-10-CM | POA: Diagnosis not present

## 2021-06-30 DIAGNOSIS — I10 Essential (primary) hypertension: Secondary | ICD-10-CM | POA: Diagnosis not present

## 2021-06-30 DIAGNOSIS — R634 Abnormal weight loss: Secondary | ICD-10-CM | POA: Diagnosis not present

## 2021-06-30 DIAGNOSIS — Z0000 Encounter for general adult medical examination without abnormal findings: Secondary | ICD-10-CM | POA: Diagnosis not present

## 2021-06-30 DIAGNOSIS — K219 Gastro-esophageal reflux disease without esophagitis: Secondary | ICD-10-CM | POA: Diagnosis not present

## 2021-06-30 DIAGNOSIS — I493 Ventricular premature depolarization: Secondary | ICD-10-CM | POA: Diagnosis not present

## 2021-06-30 DIAGNOSIS — Z8673 Personal history of transient ischemic attack (TIA), and cerebral infarction without residual deficits: Secondary | ICD-10-CM | POA: Diagnosis not present

## 2021-06-30 DIAGNOSIS — G4733 Obstructive sleep apnea (adult) (pediatric): Secondary | ICD-10-CM | POA: Diagnosis not present

## 2021-06-30 DIAGNOSIS — R413 Other amnesia: Secondary | ICD-10-CM | POA: Diagnosis not present

## 2021-06-30 DIAGNOSIS — M170 Bilateral primary osteoarthritis of knee: Secondary | ICD-10-CM | POA: Diagnosis not present

## 2021-06-30 DIAGNOSIS — E785 Hyperlipidemia, unspecified: Secondary | ICD-10-CM | POA: Diagnosis not present

## 2021-06-30 DIAGNOSIS — Z1331 Encounter for screening for depression: Secondary | ICD-10-CM | POA: Diagnosis not present

## 2021-06-30 DIAGNOSIS — E663 Overweight: Secondary | ICD-10-CM | POA: Diagnosis not present

## 2021-07-05 DIAGNOSIS — D2239 Melanocytic nevi of other parts of face: Secondary | ICD-10-CM | POA: Diagnosis not present

## 2021-07-05 DIAGNOSIS — L570 Actinic keratosis: Secondary | ICD-10-CM | POA: Diagnosis not present

## 2021-07-05 DIAGNOSIS — D225 Melanocytic nevi of trunk: Secondary | ICD-10-CM | POA: Diagnosis not present

## 2021-07-05 DIAGNOSIS — D1801 Hemangioma of skin and subcutaneous tissue: Secondary | ICD-10-CM | POA: Diagnosis not present

## 2021-07-05 DIAGNOSIS — L821 Other seborrheic keratosis: Secondary | ICD-10-CM | POA: Diagnosis not present

## 2021-07-05 DIAGNOSIS — L918 Other hypertrophic disorders of the skin: Secondary | ICD-10-CM | POA: Diagnosis not present

## 2021-07-05 DIAGNOSIS — L814 Other melanin hyperpigmentation: Secondary | ICD-10-CM | POA: Diagnosis not present

## 2021-07-05 DIAGNOSIS — L905 Scar conditions and fibrosis of skin: Secondary | ICD-10-CM | POA: Diagnosis not present

## 2021-07-05 DIAGNOSIS — D2262 Melanocytic nevi of left upper limb, including shoulder: Secondary | ICD-10-CM | POA: Diagnosis not present

## 2021-07-06 ENCOUNTER — Ambulatory Visit
Admission: RE | Admit: 2021-07-06 | Discharge: 2021-07-06 | Disposition: A | Discharge status: Home / Self Care | Payer: PPO | Source: Ambulatory Visit | Attending: Orthopedic Surgery | Admitting: Orthopedic Surgery

## 2021-07-06 DIAGNOSIS — S46012A Strain of muscle(s) and tendon(s) of the rotator cuff of left shoulder, initial encounter: Secondary | ICD-10-CM | POA: Diagnosis not present

## 2021-07-06 DIAGNOSIS — M19012 Primary osteoarthritis, left shoulder: Secondary | ICD-10-CM | POA: Diagnosis not present

## 2021-07-06 DIAGNOSIS — M25512 Pain in left shoulder: Secondary | ICD-10-CM

## 2021-07-06 DIAGNOSIS — M65812 Other synovitis and tenosynovitis, left shoulder: Secondary | ICD-10-CM | POA: Diagnosis not present

## 2021-07-09 DIAGNOSIS — M75122 Complete rotator cuff tear or rupture of left shoulder, not specified as traumatic: Secondary | ICD-10-CM | POA: Diagnosis not present

## 2021-07-19 ENCOUNTER — Telehealth: Payer: Self-pay | Admitting: *Deleted

## 2021-07-19 NOTE — Telephone Encounter (Addendum)
Received surgical clearance form from [place] re: shoulder surgery on 08/02/2021. Patient last seen 02/2019. Called patient for updated med list, LVM requesting call back.

## 2021-07-20 ENCOUNTER — Encounter: Payer: Self-pay | Admitting: *Deleted

## 2021-07-20 NOTE — Telephone Encounter (Signed)
Patient returned call. Med list updated. He takes ASA 81 mg daily. Surgical clearance form on MD desk for completion, signature.

## 2021-07-20 NOTE — Telephone Encounter (Signed)
Surgical clearance form and letter completed, signed, faxed to [place]. Received confirmation. Of note, patient last seen 2020, has upcoming appointment.

## 2021-07-28 HISTORY — PX: SHOULDER SURGERY: SHX246

## 2021-08-02 DIAGNOSIS — Y999 Unspecified external cause status: Secondary | ICD-10-CM | POA: Diagnosis not present

## 2021-08-02 DIAGNOSIS — M7542 Impingement syndrome of left shoulder: Secondary | ICD-10-CM | POA: Diagnosis not present

## 2021-08-02 DIAGNOSIS — S46012A Strain of muscle(s) and tendon(s) of the rotator cuff of left shoulder, initial encounter: Secondary | ICD-10-CM | POA: Diagnosis not present

## 2021-08-02 DIAGNOSIS — G8918 Other acute postprocedural pain: Secondary | ICD-10-CM | POA: Diagnosis not present

## 2021-08-02 DIAGNOSIS — X58XXXA Exposure to other specified factors, initial encounter: Secondary | ICD-10-CM | POA: Diagnosis not present

## 2021-08-02 DIAGNOSIS — M67814 Other specified disorders of tendon, left shoulder: Secondary | ICD-10-CM | POA: Diagnosis not present

## 2021-08-13 DIAGNOSIS — M75102 Unspecified rotator cuff tear or rupture of left shoulder, not specified as traumatic: Secondary | ICD-10-CM | POA: Diagnosis not present

## 2021-08-13 DIAGNOSIS — M25512 Pain in left shoulder: Secondary | ICD-10-CM | POA: Diagnosis not present

## 2021-08-13 DIAGNOSIS — M6281 Muscle weakness (generalized): Secondary | ICD-10-CM | POA: Diagnosis not present

## 2021-08-16 DIAGNOSIS — M25512 Pain in left shoulder: Secondary | ICD-10-CM | POA: Diagnosis not present

## 2021-08-16 DIAGNOSIS — M6281 Muscle weakness (generalized): Secondary | ICD-10-CM | POA: Diagnosis not present

## 2021-08-16 DIAGNOSIS — M75102 Unspecified rotator cuff tear or rupture of left shoulder, not specified as traumatic: Secondary | ICD-10-CM | POA: Diagnosis not present

## 2021-08-17 ENCOUNTER — Ambulatory Visit: Payer: PPO | Admitting: Diagnostic Neuroimaging

## 2021-08-17 ENCOUNTER — Encounter: Payer: Self-pay | Admitting: Diagnostic Neuroimaging

## 2021-08-17 VITALS — BP 120/82 | HR 54 | Ht 72.0 in | Wt 178.0 lb

## 2021-08-17 DIAGNOSIS — G250 Essential tremor: Secondary | ICD-10-CM | POA: Diagnosis not present

## 2021-08-17 DIAGNOSIS — R413 Other amnesia: Secondary | ICD-10-CM | POA: Diagnosis not present

## 2021-08-17 NOTE — Progress Notes (Signed)
[facility]  PATIENT: Bryan Hart DOB: [birth year]  REFERRING CLINICIAN: Sueanne Margarita, DO  HISTORY FROM: patient  REASON FOR VISIT: follow up    HISTORICAL  CHIEF COMPLAINT:  Chief Complaint  Patient presents with   New Patient (Initial Visit)    Rm 6 here for consults on memory changes. Pt has hx of strokes.     HISTORY OF PRESENT ILLNESS:   UPDATE (08/17/21, VRP): Since last visit, doing well. Mild right hand tremor (mother had tremor; 2 other brothers have termor now). Mild short term memory loss, diff with naming, since last 2 years. No major changes ADLs. Overall doing well.   UPDATE (03/19/19, VRP): Since last visit, doing well. Symptoms are stable. Severity is mild. No alleviating or aggravating factors. Tolerating meds.  PRIOR HPI (03/12/18): 71 year old left-handed male here for evaluation of stroke.  History of hypertension and sleep apnea.  Approximately 3 weeks of patient noticed mild numbness in his right hand, digits 3 and 4, with some clumsiness of the right hand.  He had recently started a muscle relaxer and thought this may be a side effect.  Few days later patient went into PCP for evaluation, who recognized possible stroke symptoms and ordered MRI of the brain.  MRI of the brain which confirmed acute to subacute ischemic infarction of the left precentral gyrus, correlating to patient's symptoms.  Echocardiogram and carotid ultrasound were obtained which were unremarkable.  Patient was started on aspirin 81 mg/day and referred here for further follow-up.  Patient denies any numbness or weakness in the right face and right leg.  No headaches.  No recent injuries or traumas.  Symptoms are gradually improving.  Patient was diagnosed with mild to moderate sleep apnea 6-7 years ago, but could not tolerate CPAP mask.  He did not have any other further follow-up.   REVIEW OF SYSTEMS: Full 14 system review of systems performed and negative with  exception of: as per HPI.   ALLERGIES: Allergies  Allergen Reactions   Adhesive [Tape] Rash    HOME MEDICATIONS: Outpatient Medications Prior to Visit  Medication Sig Dispense Refill   aspirin EC 81 MG tablet Take 81 mg by mouth daily.     atorvastatin (LIPITOR) 20 MG tablet Take 20 mg by mouth daily at 6 PM.     Multiple Vitamin (MULTIVITAMIN) tablet Take 1 tablet by mouth daily.     verapamil (CALAN-SR) 240 MG CR tablet Take 240 mg by mouth daily.     No facility-administered medications prior to visit.    PAST MEDICAL HISTORY: Past Medical History:  Diagnosis Date   HLD (hyperlipidemia)    Hypertension    dr  ed green      stress test   12 yrs ago   PVC's (premature ventricular contractions) 12 years ago   stress test done   Sleep apnea    Stroke [facility])    minor stroke 01/2018    PAST SURGICAL HISTORY: Past Surgical History:  Procedure Laterality Date   ANKLE ARTHROSCOPY  2010   JOINT REPLACEMENT  2011   rt shoulder rotator cuff repair   knee surgeries  [id number]   x2   SHOULDER HEMI-ARTHROPLASTY  01/12/2012   Procedure: SHOULDER HEMI-ARTHROPLASTY;  Surgeon: Nita Sells, MD;  Location: [place];  Service: Orthopedics;  Laterality: Right;    FAMILY HISTORY: Family History  Problem Relation Age of Onset   Heart disease Father    Hypertension Father    Heart attack  Brother    Hypertension Brother    Stroke Paternal Grandmother    Heart attack Maternal Uncle    Heart disease Brother    Colon cancer Neg Hx     SOCIAL HISTORY: Social History   Socioeconomic History   Marital status: Married    Spouse name: [name]   Number of children: Not on file   Years of education: Not on file   Highest education level: Master's degree (e.g., MA, MS, MEng, MEd, MSW, MBA)  Occupational History   Not on file  Tobacco Use   Smoking status: Former    Packs/day: 0.50    Years: 4.00    Pack years: 2.00    Types: Cigarettes    Quit date: 1975    Years since  quitting: 48.1   Smokeless tobacco: Never  Vaping Use   Vaping Use: Never used  Substance and Sexual Activity   Alcohol use: Yes    Alcohol/week: 1.0 - 2.0 standard drink    Types: 1 - 2 Cans of beer per week    Comment: daily   Drug use: No   Sexual activity: Not on file  Other Topics Concern   Not on file  Social History Narrative   Lives home with spouse [name].   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   Left handed    Social Determinants of Health   Financial Resource Strain: Not on file  Food Insecurity: Not on file  Transportation Needs: Not on file  Physical Activity: Not on file  Stress: Not on file  Social Connections: Not on file  Intimate Partner Violence: Not on file     PHYSICAL EXAM  GENERAL EXAM/CONSTITUTIONAL: Vitals:  Vitals:   08/17/21 0849  BP: 120/82  Pulse: (!) 54  SpO2: 98%  Weight: 178 lb (80.7 kg)  Height: 6' (1.829 m)   Body mass index is 24.14 kg/m. Wt Readings from Last 3 Encounters:  08/17/21 178 lb (80.7 kg)  09/22/20 190 lb (86.2 kg)  05/06/20 190 lb (86.2 kg)   Patient is in no distress; well developed, nourished and groomed; neck is supple  CARDIOVASCULAR: Examination of carotid arteries is normal; no carotid bruits Regular rate and rhythm, no murmurs Examination of peripheral vascular system by observation and palpation is normal  EYES: Ophthalmoscopic exam of optic discs and posterior segments is normal; no papilledema or hemorrhages No results found.  MUSCULOSKELETAL: Gait, strength, tone, movements noted in Neurologic exam below  NEUROLOGIC: MENTAL STATUS:  MMSE - Mini Mental State Exam 08/17/2021  Orientation to time 5  Orientation to Place 5  Registration 3  Attention/ Calculation 4  Recall 2  Language- name 2 objects 2  Language- repeat 1  Language- follow 3 step command 3  Language- read & follow direction 1  Write a sentence 1  Copy design 1  Total score 28   awake, alert, oriented to person, place and  time recent and remote memory intact normal attention and concentration language fluent, comprehension intact, naming intact fund of knowledge appropriate  CRANIAL NERVE:  2nd - no papilledema on fundoscopic exam 2nd, 3rd, 4th, 6th - pupils equal and reactive to light, visual fields full to confrontation, extraocular muscles intact, no nystagmus 5th - facial sensation symmetric 7th - facial strength symmetric 8th - hearing intact 9th - palate elevates symmetrically, uvula midline 11th - shoulder shrug symmetric 12th - tongue protrusion midline  MOTOR:  normal bulk and tone, full strength in the BUE, BLE; LEFT ARM  IN SLING (S/P SURGERY)  SENSORY:  normal and symmetric to light touch, temperature, vibration  COORDINATION:  finger-nose-finger, fine finger movements normal  REFLEXES:  deep tendon reflexes TRACE and symmetric  GAIT/STATION:  narrow based gait     DIAGNOSTIC DATA (LABS, IMAGING, TESTING) - I reviewed patient records, labs, notes, testing and imaging myself where available.  Lab Results  Component Value Date   WBC 6.6 01/13/2012   HGB 11.5 (L) 01/13/2012   HCT 35.1 (L) 01/13/2012   MCV 91.4 01/13/2012   PLT 207 01/13/2012      Component Value Date/Time   NA 136 01/13/2012 0505   K 4.1 01/13/2012 0505   CL 103 01/13/2012 0505   CO2 25 01/13/2012 0505   GLUCOSE 104 (H) 01/13/2012 0505   BUN 10 01/13/2012 0505   CREATININE 1.29 (H) 02/24/2018 1230   CALCIUM 8.4 01/13/2012 0505   PROT 7.2 01/12/2012 0634   ALBUMIN 4.1 01/12/2012 0634   AST 23 01/12/2012 0634   ALT 20 01/12/2012 0634   ALKPHOS 87 01/12/2012 0634   BILITOT 0.3 01/12/2012 0634   GFRNONAA 55 (L) 02/24/2018 1230   GFRAA >60 02/24/2018 1230   No results found for: CHOL, HDL, LDLCALC, LDLDIRECT, TRIG, CHOLHDL No results found for: HGBA1C No results found for: VITAMINB12 No results found for: TSH   03/05/18 TTE  - Normal LV size and systolic function, EF 84-53%. Normal RV size   and  systolic function. Borderline pulmonary hypertension.  03/05/18 carotid u/s Right Carotid: Velocities in the right ICA are consistent with a 1-39% stenosis. Left Carotid: Velocities in the left ICA are consistent with a 1-39% stenosis. Vertebrals:  Bilateral vertebral arteries demonstrate antegrade flow. Subclavians: Normal flow hemodynamics were seen in bilateral subclavian arteries.  02/24/18 MRI brain (with and without) [I reviewed images myself and agree with interpretation. -VRP]  1. 1 cm acute infarct on the left motor strip at the hand knob. 2. 6 mm enhancing nodule medial to the right V4 segment. A schwannoma or benign enhancing foramen magnum lesion is favored. Further discussion above. Recommend follow-up brain MRI to ensure stability. 3. Small remote right frontal cortex infarct.   ASSESSMENT AND PLAN  71 y.o. year old male here with HTN, sleep apnea, here for stroke evaluation.   Ddx: left frontal stroke (artery-artery embolism vs small vessel thrombosis)  1. Memory loss   2. Essential tremor       PLAN:  MEMORY LOSS (MMSE 28/30) - normal aging vs mild cognitive impairment - safety / supervision issues reviewed - daily physical activity / exercise (at least 15-30 minutes) - eat more plants / vegetables - increase social activities, brain stimulation, games, puzzles, hobbies, crafts, arts, music - aim for at least 7-8 hours sleep per night (or more) - avoid smoking and alcohol - caregiver resources provided - caution with medications, finances, driving  TREMOR  - mild essential tremor; monitor  STROKE PREVENTION - continue aspirin, verapamil, statin   VERTEBRAL ARTERY LESION - non-specific lesion; monitor   SLEEP APNEA - follow up with Dr. Brett Fairy  Return for pending test results, pending if symptoms worsen or fail to improve.    Penni Bombard, MD 6/46/8032, 1:22 AM Certified in Neurology, Neurophysiology and Neuroimaging  [facility] [address] [place], [place] [address] [phone]

## 2021-08-17 NOTE — Patient Instructions (Signed)
MEMORY LOSS (MMSE 28/30) - normal aging vs mild cognitive impairment - safety / supervision issues reviewed - daily physical activity / exercise (at least 15-30 minutes) - eat more plants / vegetables - increase social activities, brain stimulation, games, puzzles, hobbies, crafts, arts, music - aim for at least 7-8 hours sleep per night (or more) - avoid smoking and alcohol - caregiver resources provided - caution with medications, finances, driving  TREMOR  - mild essential tremor; monitor  STROKE PREVENTION - continue aspirin, verapamil, statin

## 2021-08-18 DIAGNOSIS — H938X3 Other specified disorders of ear, bilateral: Secondary | ICD-10-CM | POA: Diagnosis not present

## 2021-08-18 DIAGNOSIS — H6123 Impacted cerumen, bilateral: Secondary | ICD-10-CM | POA: Insufficient documentation

## 2021-08-18 DIAGNOSIS — Z87891 Personal history of nicotine dependence: Secondary | ICD-10-CM | POA: Diagnosis not present

## 2021-08-19 DIAGNOSIS — M25512 Pain in left shoulder: Secondary | ICD-10-CM | POA: Diagnosis not present

## 2021-08-19 DIAGNOSIS — M6281 Muscle weakness (generalized): Secondary | ICD-10-CM | POA: Diagnosis not present

## 2021-08-19 DIAGNOSIS — M75102 Unspecified rotator cuff tear or rupture of left shoulder, not specified as traumatic: Secondary | ICD-10-CM | POA: Diagnosis not present

## 2021-08-20 DIAGNOSIS — M75102 Unspecified rotator cuff tear or rupture of left shoulder, not specified as traumatic: Secondary | ICD-10-CM | POA: Diagnosis not present

## 2021-08-20 DIAGNOSIS — M6281 Muscle weakness (generalized): Secondary | ICD-10-CM | POA: Diagnosis not present

## 2021-08-20 DIAGNOSIS — M25512 Pain in left shoulder: Secondary | ICD-10-CM | POA: Diagnosis not present

## 2021-08-23 DIAGNOSIS — M25512 Pain in left shoulder: Secondary | ICD-10-CM | POA: Diagnosis not present

## 2021-08-23 DIAGNOSIS — M75102 Unspecified rotator cuff tear or rupture of left shoulder, not specified as traumatic: Secondary | ICD-10-CM | POA: Diagnosis not present

## 2021-08-23 DIAGNOSIS — M6281 Muscle weakness (generalized): Secondary | ICD-10-CM | POA: Diagnosis not present

## 2021-08-25 DIAGNOSIS — M6281 Muscle weakness (generalized): Secondary | ICD-10-CM | POA: Diagnosis not present

## 2021-08-25 DIAGNOSIS — M75102 Unspecified rotator cuff tear or rupture of left shoulder, not specified as traumatic: Secondary | ICD-10-CM | POA: Diagnosis not present

## 2021-08-25 DIAGNOSIS — M25512 Pain in left shoulder: Secondary | ICD-10-CM | POA: Diagnosis not present

## 2021-08-27 DIAGNOSIS — M75102 Unspecified rotator cuff tear or rupture of left shoulder, not specified as traumatic: Secondary | ICD-10-CM | POA: Diagnosis not present

## 2021-08-27 DIAGNOSIS — M25512 Pain in left shoulder: Secondary | ICD-10-CM | POA: Diagnosis not present

## 2021-08-27 DIAGNOSIS — M6281 Muscle weakness (generalized): Secondary | ICD-10-CM | POA: Diagnosis not present

## 2021-08-30 DIAGNOSIS — M75102 Unspecified rotator cuff tear or rupture of left shoulder, not specified as traumatic: Secondary | ICD-10-CM | POA: Diagnosis not present

## 2021-08-30 DIAGNOSIS — M6281 Muscle weakness (generalized): Secondary | ICD-10-CM | POA: Diagnosis not present

## 2021-08-30 DIAGNOSIS — M25512 Pain in left shoulder: Secondary | ICD-10-CM | POA: Diagnosis not present

## 2021-09-01 DIAGNOSIS — M25512 Pain in left shoulder: Secondary | ICD-10-CM | POA: Diagnosis not present

## 2021-09-01 DIAGNOSIS — M6281 Muscle weakness (generalized): Secondary | ICD-10-CM | POA: Diagnosis not present

## 2021-09-01 DIAGNOSIS — M75102 Unspecified rotator cuff tear or rupture of left shoulder, not specified as traumatic: Secondary | ICD-10-CM | POA: Diagnosis not present

## 2021-09-06 DIAGNOSIS — M25512 Pain in left shoulder: Secondary | ICD-10-CM | POA: Diagnosis not present

## 2021-09-06 DIAGNOSIS — M6281 Muscle weakness (generalized): Secondary | ICD-10-CM | POA: Diagnosis not present

## 2021-09-06 DIAGNOSIS — M75102 Unspecified rotator cuff tear or rupture of left shoulder, not specified as traumatic: Secondary | ICD-10-CM | POA: Diagnosis not present

## 2021-09-08 DIAGNOSIS — M6281 Muscle weakness (generalized): Secondary | ICD-10-CM | POA: Diagnosis not present

## 2021-09-08 DIAGNOSIS — M75102 Unspecified rotator cuff tear or rupture of left shoulder, not specified as traumatic: Secondary | ICD-10-CM | POA: Diagnosis not present

## 2021-09-08 DIAGNOSIS — M25512 Pain in left shoulder: Secondary | ICD-10-CM | POA: Diagnosis not present

## 2021-09-10 DIAGNOSIS — M6281 Muscle weakness (generalized): Secondary | ICD-10-CM | POA: Diagnosis not present

## 2021-09-10 DIAGNOSIS — M25512 Pain in left shoulder: Secondary | ICD-10-CM | POA: Diagnosis not present

## 2021-09-10 DIAGNOSIS — M75102 Unspecified rotator cuff tear or rupture of left shoulder, not specified as traumatic: Secondary | ICD-10-CM | POA: Diagnosis not present

## 2021-09-13 DIAGNOSIS — M25512 Pain in left shoulder: Secondary | ICD-10-CM | POA: Diagnosis not present

## 2021-09-13 DIAGNOSIS — M75102 Unspecified rotator cuff tear or rupture of left shoulder, not specified as traumatic: Secondary | ICD-10-CM | POA: Diagnosis not present

## 2021-09-13 DIAGNOSIS — M6281 Muscle weakness (generalized): Secondary | ICD-10-CM | POA: Diagnosis not present

## 2021-09-15 DIAGNOSIS — M25512 Pain in left shoulder: Secondary | ICD-10-CM | POA: Diagnosis not present

## 2021-09-15 DIAGNOSIS — M6281 Muscle weakness (generalized): Secondary | ICD-10-CM | POA: Diagnosis not present

## 2021-09-15 DIAGNOSIS — M75102 Unspecified rotator cuff tear or rupture of left shoulder, not specified as traumatic: Secondary | ICD-10-CM | POA: Diagnosis not present

## 2021-09-17 DIAGNOSIS — M25512 Pain in left shoulder: Secondary | ICD-10-CM | POA: Diagnosis not present

## 2021-09-17 DIAGNOSIS — M75102 Unspecified rotator cuff tear or rupture of left shoulder, not specified as traumatic: Secondary | ICD-10-CM | POA: Diagnosis not present

## 2021-09-17 DIAGNOSIS — M6281 Muscle weakness (generalized): Secondary | ICD-10-CM | POA: Diagnosis not present

## 2021-09-20 DIAGNOSIS — M6281 Muscle weakness (generalized): Secondary | ICD-10-CM | POA: Diagnosis not present

## 2021-09-20 DIAGNOSIS — M25512 Pain in left shoulder: Secondary | ICD-10-CM | POA: Diagnosis not present

## 2021-09-20 DIAGNOSIS — M75102 Unspecified rotator cuff tear or rupture of left shoulder, not specified as traumatic: Secondary | ICD-10-CM | POA: Diagnosis not present

## 2021-09-22 DIAGNOSIS — M6281 Muscle weakness (generalized): Secondary | ICD-10-CM | POA: Diagnosis not present

## 2021-09-22 DIAGNOSIS — M25512 Pain in left shoulder: Secondary | ICD-10-CM | POA: Diagnosis not present

## 2021-09-22 DIAGNOSIS — M75102 Unspecified rotator cuff tear or rupture of left shoulder, not specified as traumatic: Secondary | ICD-10-CM | POA: Diagnosis not present

## 2021-09-29 DIAGNOSIS — M25512 Pain in left shoulder: Secondary | ICD-10-CM | POA: Diagnosis not present

## 2021-09-29 DIAGNOSIS — M75102 Unspecified rotator cuff tear or rupture of left shoulder, not specified as traumatic: Secondary | ICD-10-CM | POA: Diagnosis not present

## 2021-09-29 DIAGNOSIS — M6281 Muscle weakness (generalized): Secondary | ICD-10-CM | POA: Diagnosis not present

## 2021-10-04 DIAGNOSIS — M6281 Muscle weakness (generalized): Secondary | ICD-10-CM | POA: Diagnosis not present

## 2021-10-04 DIAGNOSIS — M75102 Unspecified rotator cuff tear or rupture of left shoulder, not specified as traumatic: Secondary | ICD-10-CM | POA: Diagnosis not present

## 2021-10-04 DIAGNOSIS — M25512 Pain in left shoulder: Secondary | ICD-10-CM | POA: Diagnosis not present

## 2021-10-06 DIAGNOSIS — M6281 Muscle weakness (generalized): Secondary | ICD-10-CM | POA: Diagnosis not present

## 2021-10-06 DIAGNOSIS — M25512 Pain in left shoulder: Secondary | ICD-10-CM | POA: Diagnosis not present

## 2021-10-06 DIAGNOSIS — M75102 Unspecified rotator cuff tear or rupture of left shoulder, not specified as traumatic: Secondary | ICD-10-CM | POA: Diagnosis not present

## 2021-10-13 DIAGNOSIS — M25512 Pain in left shoulder: Secondary | ICD-10-CM | POA: Diagnosis not present

## 2021-10-13 DIAGNOSIS — M75102 Unspecified rotator cuff tear or rupture of left shoulder, not specified as traumatic: Secondary | ICD-10-CM | POA: Diagnosis not present

## 2021-10-13 DIAGNOSIS — M6281 Muscle weakness (generalized): Secondary | ICD-10-CM | POA: Diagnosis not present

## 2021-10-15 DIAGNOSIS — M75102 Unspecified rotator cuff tear or rupture of left shoulder, not specified as traumatic: Secondary | ICD-10-CM | POA: Diagnosis not present

## 2021-10-15 DIAGNOSIS — M25512 Pain in left shoulder: Secondary | ICD-10-CM | POA: Diagnosis not present

## 2021-10-15 DIAGNOSIS — M6281 Muscle weakness (generalized): Secondary | ICD-10-CM | POA: Diagnosis not present

## 2021-10-19 DIAGNOSIS — M6281 Muscle weakness (generalized): Secondary | ICD-10-CM | POA: Diagnosis not present

## 2021-10-19 DIAGNOSIS — M25512 Pain in left shoulder: Secondary | ICD-10-CM | POA: Diagnosis not present

## 2021-10-19 DIAGNOSIS — M75102 Unspecified rotator cuff tear or rupture of left shoulder, not specified as traumatic: Secondary | ICD-10-CM | POA: Diagnosis not present

## 2021-10-21 DIAGNOSIS — M75102 Unspecified rotator cuff tear or rupture of left shoulder, not specified as traumatic: Secondary | ICD-10-CM | POA: Diagnosis not present

## 2021-10-21 DIAGNOSIS — M25512 Pain in left shoulder: Secondary | ICD-10-CM | POA: Diagnosis not present

## 2021-10-21 DIAGNOSIS — M6281 Muscle weakness (generalized): Secondary | ICD-10-CM | POA: Diagnosis not present

## 2021-10-25 DIAGNOSIS — M6281 Muscle weakness (generalized): Secondary | ICD-10-CM | POA: Diagnosis not present

## 2021-10-25 DIAGNOSIS — M25512 Pain in left shoulder: Secondary | ICD-10-CM | POA: Diagnosis not present

## 2021-10-25 DIAGNOSIS — M75102 Unspecified rotator cuff tear or rupture of left shoulder, not specified as traumatic: Secondary | ICD-10-CM | POA: Diagnosis not present

## 2021-11-03 DIAGNOSIS — M25512 Pain in left shoulder: Secondary | ICD-10-CM | POA: Diagnosis not present

## 2021-11-03 DIAGNOSIS — M6281 Muscle weakness (generalized): Secondary | ICD-10-CM | POA: Diagnosis not present

## 2021-11-03 DIAGNOSIS — M75102 Unspecified rotator cuff tear or rupture of left shoulder, not specified as traumatic: Secondary | ICD-10-CM | POA: Diagnosis not present

## 2021-11-09 DIAGNOSIS — M75102 Unspecified rotator cuff tear or rupture of left shoulder, not specified as traumatic: Secondary | ICD-10-CM | POA: Diagnosis not present

## 2021-11-09 DIAGNOSIS — M25512 Pain in left shoulder: Secondary | ICD-10-CM | POA: Diagnosis not present

## 2021-11-09 DIAGNOSIS — M6281 Muscle weakness (generalized): Secondary | ICD-10-CM | POA: Diagnosis not present

## 2021-11-17 DIAGNOSIS — M6281 Muscle weakness (generalized): Secondary | ICD-10-CM | POA: Diagnosis not present

## 2021-11-17 DIAGNOSIS — M25512 Pain in left shoulder: Secondary | ICD-10-CM | POA: Diagnosis not present

## 2021-11-17 DIAGNOSIS — M75102 Unspecified rotator cuff tear or rupture of left shoulder, not specified as traumatic: Secondary | ICD-10-CM | POA: Diagnosis not present

## 2021-11-24 DIAGNOSIS — M6281 Muscle weakness (generalized): Secondary | ICD-10-CM | POA: Diagnosis not present

## 2021-11-24 DIAGNOSIS — M25512 Pain in left shoulder: Secondary | ICD-10-CM | POA: Diagnosis not present

## 2021-11-24 DIAGNOSIS — M75102 Unspecified rotator cuff tear or rupture of left shoulder, not specified as traumatic: Secondary | ICD-10-CM | POA: Diagnosis not present

## 2021-12-01 DIAGNOSIS — M25512 Pain in left shoulder: Secondary | ICD-10-CM | POA: Diagnosis not present

## 2021-12-01 DIAGNOSIS — M19211 Secondary osteoarthritis, right shoulder: Secondary | ICD-10-CM | POA: Diagnosis not present

## 2021-12-01 DIAGNOSIS — Z09 Encounter for follow-up examination after completed treatment for conditions other than malignant neoplasm: Secondary | ICD-10-CM | POA: Diagnosis not present

## 2021-12-01 DIAGNOSIS — M6281 Muscle weakness (generalized): Secondary | ICD-10-CM | POA: Diagnosis not present

## 2021-12-01 DIAGNOSIS — M75102 Unspecified rotator cuff tear or rupture of left shoulder, not specified as traumatic: Secondary | ICD-10-CM | POA: Diagnosis not present

## 2021-12-08 DIAGNOSIS — M25512 Pain in left shoulder: Secondary | ICD-10-CM | POA: Diagnosis not present

## 2021-12-08 DIAGNOSIS — M6281 Muscle weakness (generalized): Secondary | ICD-10-CM | POA: Diagnosis not present

## 2021-12-08 DIAGNOSIS — M75102 Unspecified rotator cuff tear or rupture of left shoulder, not specified as traumatic: Secondary | ICD-10-CM | POA: Diagnosis not present

## 2022-01-06 DIAGNOSIS — E785 Hyperlipidemia, unspecified: Secondary | ICD-10-CM | POA: Diagnosis not present

## 2022-01-06 DIAGNOSIS — E663 Overweight: Secondary | ICD-10-CM | POA: Diagnosis not present

## 2022-01-06 DIAGNOSIS — R251 Tremor, unspecified: Secondary | ICD-10-CM | POA: Diagnosis not present

## 2022-01-06 DIAGNOSIS — G4733 Obstructive sleep apnea (adult) (pediatric): Secondary | ICD-10-CM | POA: Diagnosis not present

## 2022-01-06 DIAGNOSIS — Z8673 Personal history of transient ischemic attack (TIA), and cerebral infarction without residual deficits: Secondary | ICD-10-CM | POA: Diagnosis not present

## 2022-01-06 DIAGNOSIS — R413 Other amnesia: Secondary | ICD-10-CM | POA: Diagnosis not present

## 2022-01-06 DIAGNOSIS — R634 Abnormal weight loss: Secondary | ICD-10-CM | POA: Diagnosis not present

## 2022-01-06 DIAGNOSIS — I10 Essential (primary) hypertension: Secondary | ICD-10-CM | POA: Diagnosis not present

## 2022-01-06 DIAGNOSIS — K219 Gastro-esophageal reflux disease without esophagitis: Secondary | ICD-10-CM | POA: Diagnosis not present

## 2022-01-06 DIAGNOSIS — M170 Bilateral primary osteoarthritis of knee: Secondary | ICD-10-CM | POA: Diagnosis not present

## 2022-01-06 DIAGNOSIS — I493 Ventricular premature depolarization: Secondary | ICD-10-CM | POA: Diagnosis not present

## 2022-01-11 DIAGNOSIS — D692 Other nonthrombocytopenic purpura: Secondary | ICD-10-CM | POA: Diagnosis not present

## 2022-03-28 ENCOUNTER — Ambulatory Visit: Payer: PPO | Admitting: Orthopaedic Surgery

## 2022-03-28 ENCOUNTER — Ambulatory Visit: Payer: Self-pay

## 2022-03-28 ENCOUNTER — Ambulatory Visit (INDEPENDENT_AMBULATORY_CARE_PROVIDER_SITE_OTHER): Payer: PPO

## 2022-03-28 VITALS — Wt 177.0 lb

## 2022-03-28 DIAGNOSIS — M1711 Unilateral primary osteoarthritis, right knee: Secondary | ICD-10-CM

## 2022-03-28 DIAGNOSIS — M25562 Pain in left knee: Secondary | ICD-10-CM

## 2022-03-28 DIAGNOSIS — M1712 Unilateral primary osteoarthritis, left knee: Secondary | ICD-10-CM

## 2022-03-28 DIAGNOSIS — M25561 Pain in right knee: Secondary | ICD-10-CM

## 2022-03-28 DIAGNOSIS — G8929 Other chronic pain: Secondary | ICD-10-CM

## 2022-03-28 NOTE — Progress Notes (Signed)
The patient is someone I am seeing for the first time but he is a longtime patient of Dr. Durward Fortes.  He has well-documented severe arthritis in both of his knees.  He is at the point where it is detrimentally affecting his mobility, his quality of life and his actives daily living and to the point he wishes to proceed with a knee replacement for his right more painful knee.  This has been going on for over 5 years now both knees.  He has tried and failed all forms conservative treatment.  At this point his knee pain is daily and quite significant.  He is not a diabetic.  He is not on any significant pain medications and is a very active and thin 71 year old gentleman.  There is no listed fever, chills, nausea, vomiting.  On exam both knees have a moderate effusion.  Both knees have significant varus malalignment that is correctable.  Both knees have slight flexion contractures and severe medial and lateral joint line tenderness and patellofemoral crepitation throughout the arc of motion.  An AP and lateral of the right knee shows severe end-stage arthritis with bone-on-bone wear of all 3 compartments.  There is varus malalignment that is significant.  There is osteophytes in all 3 compartments.  The left knee likewise shows severe end-stage arthritis.  I did show a knee replacement model to the patient and went over his x-rays.  We described in detail what knee replacement surgery involves.  We discussed the risks and benefits of the surgery and what to expect from an intraoperative and postoperative course.  We will work on getting this scheduled per the patient's request after Thanksgiving.  All questions and concerns were answered and addressed.

## 2022-05-02 ENCOUNTER — Other Ambulatory Visit: Payer: Self-pay

## 2022-05-18 ENCOUNTER — Other Ambulatory Visit: Payer: Self-pay | Admitting: Physician Assistant

## 2022-05-18 DIAGNOSIS — M1711 Unilateral primary osteoarthritis, right knee: Secondary | ICD-10-CM

## 2022-05-23 DIAGNOSIS — Z96651 Presence of right artificial knee joint: Secondary | ICD-10-CM | POA: Diagnosis not present

## 2022-05-23 NOTE — Pre-Procedure Instructions (Signed)
Surgical Instructions    Your procedure is scheduled on Tuesday, December 5.  Report to [facility] Main Entrance "A" at 5:30 A.M., then check in with the Admitting office.  Call this number if you have problems the morning of surgery:  [phone]   If you have any questions prior to your surgery date call [phone]: Open Monday-Friday 8am-4pm If you experience any cold or flu symptoms such as cough, fever, chills, shortness of breath, etc. between now and your scheduled surgery, please notify us at the above number     Remember:  Do not eat after midnight the night before your surgery  You may drink clear liquids until 4:30AM the morning of your surgery.   Clear liquids allowed are: Water, Non-Citrus Juices (without pulp), Carbonated Beverages, Clear Tea, Black Coffee ONLY (NO MILK, CREAM OR POWDERED CREAMER of any kind), and Gatorade  Patient Instructions  The night before surgery:  No food after midnight. ONLY clear liquids after midnight  The day of surgery (if you do NOT have diabetes):  Drink ONE (1) Pre-Surgery Clear Ensure by 4:30AM the morning of surgery. Drink in one sitting. Do not sip.  This drink was given to you during your hospital  pre-op appointment visit.  Nothing else to drink after completing the  Pre-Surgery Clear Ensure.          If you have questions, please contact your surgeon's office.     Take these medicines the morning of surgery with A SIP OF WATER: NONE  Follow your surgeon's instructions on when to stop Aspirin.  If no instructions were given by your surgeon then you will need to call the office to get those instructions.      As of today, STOP taking any Aleve, Naproxen, Ibuprofen, Motrin, Advil, Goody's, BC's, all herbal medications, fish oil, and all vitamins.             [facility] is not responsible for any belongings or valuables.    Do NOT Smoke (Tobacco/Vaping)  24 hours prior to your procedure  If you use a CPAP at night,  you may bring your mask for your overnight stay.   Contacts, glasses, hearing aids, dentures or partials may not be worn into surgery, please bring cases for these belongings   For patients admitted to the hospital, discharge time will be determined by your treatment team.   Patients discharged the day of surgery will not be allowed to drive home, and someone needs to stay with them for 24 hours.   SURGICAL WAITING ROOM VISITATION Patients having surgery or a procedure may have no more than 2 support people in the waiting area - these visitors may rotate.   Children under the age of 72 must have an adult with them who is not the patient. If the patient needs to stay at the hospital during part of their recovery, the visitor guidelines for inpatient rooms apply. Pre-op nurse will coordinate an appropriate time for 1 support person to accompany patient in pre-op.  This support person may not rotate.   Please refer to [email or web address][employer]hu for the visitor guidelines for Inpatients (after your surgery is over and you are in a regular room).    Special instructions:    Oral Hygiene is also important to reduce your risk of infection.  Remember - BRUSH YOUR TEETH THE MORNING OF SURGERY WITH YOUR REGULAR TOOTHPASTE   [place]- Preparing For Surgery  Before surgery, you can play an important role. Because  skin is not sterile, your skin needs to be as free of germs as possible. You can reduce the number of germs on your skin by washing with CHG (chlorahexidine gluconate) Soap before surgery.  CHG is an antiseptic cleaner which kills germs and bonds with the skin to continue killing germs even after washing.     Please do not use if you have an allergy to CHG or antibacterial soaps. If your skin becomes reddened/irritated stop using the CHG.  Do not shave (including legs and underarms) for at least 48 hours prior to first CHG shower. It is OK to  shave your face.  Please follow these instructions carefully.     Shower the NIGHT BEFORE SURGERY and the MORNING OF SURGERY with CHG Soap.   If you chose to wash your hair, wash your hair first as usual with your normal shampoo. After you shampoo, rinse your hair and body thoroughly to remove the shampoo.  Then [employer] and genitals (private parts) with your normal soap and rinse thoroughly to remove soap.  After that Use CHG Soap as you would any other liquid soap. You can apply CHG directly to the skin and wash gently with a scrungie or a clean washcloth.   Apply the CHG Soap to your body ONLY FROM THE NECK DOWN.  Do not use on open wounds or open sores. Avoid contact with your eyes, ears, mouth and genitals (private parts). Wash Face and genitals (private parts)  with your normal soap.   Wash thoroughly, paying special attention to the area where your surgery will be performed.  Thoroughly rinse your body with warm water from the neck down.  DO NOT shower/wash with your normal soap after using and rinsing off the CHG Soap.  Pat yourself dry with a CLEAN TOWEL.  Wear CLEAN PAJAMAS to bed the night before surgery  Place CLEAN SHEETS on your bed the night before your surgery  DO NOT SLEEP WITH PETS.   Day of Surgery:  Take a shower with CHG soap. Wear Clean/Comfortable clothing the morning of surgery Do not wear jewelry or makeup. Do not wear lotions, powders, perfumes/cologne or deodorant. Do not shave 48 hours prior to surgery.  Men may shave face and neck. Do not bring valuables to the hospital. Do not wear nail polish, gel polish, artificial nails, or any other type of covering on natural nails (fingers and toes) If you have artificial nails or gel coating that need to be removed by a nail salon, please have this removed prior to surgery. Artificial nails or gel coating may interfere with anesthesia's ability to adequately monitor your vital signs. Remember to brush your  teeth WITH YOUR REGULAR TOOTHPASTE.    If you received a COVID test during your pre-op visit, it is requested that you wear a mask when out in public, stay away from anyone that may not be feeling well, and notify your surgeon if you develop symptoms. If you have been in contact with anyone that has tested positive in the last 10 days, please notify your surgeon.    Please read over the following fact sheets that you were given.

## 2022-05-24 ENCOUNTER — Encounter (HOSPITAL_COMMUNITY): Payer: Self-pay

## 2022-05-24 ENCOUNTER — Telehealth: Payer: Self-pay | Admitting: Orthopaedic Surgery

## 2022-05-24 ENCOUNTER — Other Ambulatory Visit: Payer: Self-pay

## 2022-05-24 ENCOUNTER — Encounter (HOSPITAL_COMMUNITY)
Admission: RE | Admit: 2022-05-24 | Discharge: 2022-05-24 | Disposition: A | Discharge status: Home / Self Care | Payer: PPO | Source: Ambulatory Visit | Attending: Orthopaedic Surgery | Admitting: Orthopaedic Surgery

## 2022-05-24 VITALS — BP 122/75 | HR 64 | Temp 97.6°F | Resp 17 | Ht 72.0 in | Wt 176.0 lb

## 2022-05-24 DIAGNOSIS — Z01818 Encounter for other preprocedural examination: Secondary | ICD-10-CM | POA: Diagnosis not present

## 2022-05-24 DIAGNOSIS — I10 Essential (primary) hypertension: Secondary | ICD-10-CM | POA: Diagnosis not present

## 2022-05-24 HISTORY — DX: Unspecified osteoarthritis, unspecified site: M19.90

## 2022-05-24 LAB — COMPREHENSIVE METABOLIC PANEL
ALT: 18 U/L (ref 0–44)
AST: 22 U/L (ref 15–41)
Albumin: 3.5 g/dL (ref 3.5–5.0)
Alkaline Phosphatase: 68 U/L (ref 38–126)
Anion gap: 8 (ref 5–15)
BUN: 21 mg/dL (ref 8–23)
CO2: 26 mmol/L (ref 22–32)
Calcium: 9.3 mg/dL (ref 8.9–10.3)
Chloride: 105 mmol/L (ref 98–111)
Creatinine, Ser: 1.1 mg/dL (ref 0.61–1.24)
GFR, Estimated: >60 mL/min (ref >60)
Glucose, Bld: 96 mg/dL (ref 70–99)
Potassium: 3.8 mmol/L (ref 3.5–5.1)
Sodium: 139 mmol/L (ref 135–145)
Total Bilirubin: 0.7 mg/dL (ref 0.3–1.2)
Total Protein: 6.6 g/dL (ref 6.5–8.1)

## 2022-05-24 LAB — SURGICAL PCR SCREEN
MRSA, PCR: NEGATIVE
Staphylococcus aureus: NEGATIVE

## 2022-05-24 LAB — TYPE AND SCREEN
ABO/RH(D): B POS
Antibody Screen: NEGATIVE

## 2022-05-24 LAB — CBC
HCT: 40.6 % (ref 39.0–52.0)
Hemoglobin: 12.9 g/dL — ABNORMAL LOW (ref 13.0–17.0)
MCH: 31.5 pg (ref 26.0–34.0)
MCHC: 31.8 g/dL (ref 30.0–36.0)
MCV: 99.3 fL (ref 80.0–100.0)
Platelets: 249 10*3/uL (ref 150–400)
RBC: 4.09 MIL/uL — ABNORMAL LOW (ref 4.22–5.81)
RDW: 12.6 % (ref 11.5–15.5)
WBC: 5.8 10*3/uL (ref 4.0–10.5)
nRBC: 0 % (ref 0.0–0.2)

## 2022-05-24 NOTE — Pre-Procedure Instructions (Signed)
Surgical Instructions    Your procedure is scheduled on Tuesday, December 5.  Report to [facility] Main Entrance "A" at 5:30 A.M., then check in with the Admitting office.  Call this number if you have problems the morning of surgery:  [phone]   If you have any questions prior to your surgery date call [phone]: Open Monday-Friday 8am-4pm If you experience any cold or flu symptoms such as cough, fever, chills, shortness of breath, etc. between now and your scheduled surgery, please notify us at the above number     Remember:  Do not eat after midnight the night before your surgery  You may drink clear liquids until 4:30AM the morning of your surgery.   Clear liquids allowed are: Water, Non-Citrus Juices (without pulp), Carbonated Beverages, Clear Tea, Black Coffee ONLY (NO MILK, CREAM OR POWDERED CREAMER of any kind), and Gatorade  Patient Instructions  The night before surgery:  No food after midnight. ONLY clear liquids after midnight  The day of surgery (if you do NOT have diabetes):  Drink ONE (1) Pre-Surgery Clear Ensure by 4:30AM the morning of surgery. Drink in one sitting. Do not sip.  This drink was given to you during your hospital  pre-op appointment visit.  Nothing else to drink after completing the  Pre-Surgery Clear Ensure.          If you have questions, please contact your surgeon's office.     Take these medicines the morning of surgery with A SIP OF WATER: Verapamil (calan-SR)  Follow your surgeon's instructions on when to stop Aspirin.  If no instructions were given by your surgeon then you will need to call the office to get those instructions.      As of today, STOP taking any Aleve, Naproxen, Ibuprofen, Motrin, Advil, Goody's, BC's, all herbal medications, fish oil, and all vitamins.             [facility] is not responsible for any belongings or valuables.    Do NOT Smoke (Tobacco/Vaping)  24 hours prior to your procedure  If you use  a CPAP at night, you may bring your mask for your overnight stay.   Contacts, glasses, hearing aids, dentures or partials may not be worn into surgery, please bring cases for these belongings   For patients admitted to the hospital, discharge time will be determined by your treatment team.   Patients discharged the day of surgery will not be allowed to drive home, and someone needs to stay with them for 24 hours.   SURGICAL WAITING ROOM VISITATION Patients having surgery or a procedure may have no more than 2 support people in the waiting area - these visitors may rotate.   Children under the age of 33 must have an adult with them who is not the patient. If the patient needs to stay at the hospital during part of their recovery, the visitor guidelines for inpatient rooms apply. Pre-op nurse will coordinate an appropriate time for 1 support person to accompany patient in pre-op.  This support person may not rotate.   Please refer to [email or web address] for the visitor guidelines for Inpatients (after your surgery is over and you are in a regular room).    Special instructions:    Oral Hygiene is also important to reduce your risk of infection.  Remember - BRUSH YOUR TEETH THE MORNING OF SURGERY WITH YOUR REGULAR TOOTHPASTE   [facility]- Preparing For Surgery  Before surgery, you can play an important role.  Because skin is not sterile, your skin needs to be as free of germs as possible. You can reduce the number of germs on your skin by washing with CHG (chlorahexidine gluconate) Soap before surgery.  CHG is an antiseptic cleaner which kills germs and bonds with the skin to continue killing germs even after washing.     Please do not use if you have an allergy to CHG or antibacterial soaps. If your skin becomes reddened/irritated stop using the CHG.  Do not shave (including legs and underarms) for at least 48 hours prior to first CHG  shower. It is OK to shave your face.  Please follow these instructions carefully.     Shower the NIGHT BEFORE SURGERY and the MORNING OF SURGERY with CHG Soap.   If you chose to wash your hair, wash your hair first as usual with your normal shampoo. After you shampoo, rinse your hair and body thoroughly to remove the shampoo.  Then [employer] and genitals (private parts) with your normal soap and rinse thoroughly to remove soap.  After that Use CHG Soap as you would any other liquid soap. You can apply CHG directly to the skin and wash gently with a scrungie or a clean washcloth.   Apply the CHG Soap to your body ONLY FROM THE NECK DOWN.  Do not use on open wounds or open sores. Avoid contact with your eyes, ears, mouth and genitals (private parts). Wash Face and genitals (private parts)  with your normal soap.   Wash thoroughly, paying special attention to the area where your surgery will be performed.  Thoroughly rinse your body with warm water from the neck down.  DO NOT shower/wash with your normal soap after using and rinsing off the CHG Soap.  Pat yourself dry with a CLEAN TOWEL.  Wear CLEAN PAJAMAS to bed the night before surgery  Place CLEAN SHEETS on your bed the night before your surgery  DO NOT SLEEP WITH PETS.   Day of Surgery:  Take a shower with CHG soap. Wear Clean/Comfortable clothing the morning of surgery Do not wear jewelry or makeup. Do not wear lotions, powders, perfumes/cologne or deodorant. Do not shave 48 hours prior to surgery.  Men may shave face and neck. Do not bring valuables to the hospital. Do not wear nail polish, gel polish, artificial nails, or any other type of covering on natural nails (fingers and toes) If you have artificial nails or gel coating that need to be removed by a nail salon, please have this removed prior to surgery. Artificial nails or gel coating may interfere with anesthesia's ability to adequately monitor your vital  signs. Remember to brush your teeth WITH YOUR REGULAR TOOTHPASTE.    If you received a COVID test during your pre-op visit, it is requested that you wear a mask when out in public, stay away from anyone that may not be feeling well, and notify your surgeon if you develop symptoms. If you have been in contact with anyone that has tested positive in the last 10 days, please notify your surgeon.    Please read over the following fact sheets that you were given.

## 2022-05-24 NOTE — Telephone Encounter (Signed)
Pt states he doesn't remember when to stop taking his ASA prior to surgery. Please call [phone]

## 2022-05-24 NOTE — Progress Notes (Addendum)
PCP - Dr.Austin Francesco Sor Cardiologist - pt denies. Did see cardiologist in 2019 when he had a "minor stroke"  PPM/ICD - pt denies Device Orders - n/a Rep Notified - n/a  Chest x-ray - n/a EKG - 05/24/22 Stress Test - pt unsure. Wife at side does not remember. If done was done when he had ECHO 2019. ECHO - 03/05/2018 Cardiac Cath - pt denies  Sleep Study - yes +sleep apnea CPAP - pt denies using this  Patient denies Diabetes   Last dose of GLP1 agonist-  pt denies GLP1 instructions: pt denies  Blood Thinner Instructions:pt denies Aspirin Instructions:Follow your surgeon's instructions on when to stop Aspirin.  If no instructions were given by your surgeon then you will need to call the office to get those instructions.  -pt will call today to get ASA instructions.   ERAS Protcol -yes PRE-SURGERY Ensure given at PAT visit today   COVID TEST- n/a   Anesthesia review: NO   Patient denies shortness of breath, fever, cough and chest pain at PAT appointment   All instructions explained to the patient, with a verbal understanding of the material. Patient agrees to go over the instructions while at home for a better understanding. Patient also instructed to self quarantine after being tested for COVID-19. The opportunity to ask questions was provided.

## 2022-05-24 NOTE — Telephone Encounter (Signed)
LMOM for patient of the below message  

## 2022-05-25 ENCOUNTER — Telehealth: Payer: Self-pay | Admitting: Orthopaedic Surgery

## 2022-05-25 DIAGNOSIS — M170 Bilateral primary osteoarthritis of knee: Secondary | ICD-10-CM | POA: Diagnosis not present

## 2022-05-25 DIAGNOSIS — Z8673 Personal history of transient ischemic attack (TIA), and cerebral infarction without residual deficits: Secondary | ICD-10-CM | POA: Diagnosis not present

## 2022-05-25 DIAGNOSIS — G4733 Obstructive sleep apnea (adult) (pediatric): Secondary | ICD-10-CM | POA: Diagnosis not present

## 2022-05-25 DIAGNOSIS — I493 Ventricular premature depolarization: Secondary | ICD-10-CM | POA: Diagnosis not present

## 2022-05-25 DIAGNOSIS — R251 Tremor, unspecified: Secondary | ICD-10-CM | POA: Diagnosis not present

## 2022-05-25 DIAGNOSIS — E785 Hyperlipidemia, unspecified: Secondary | ICD-10-CM | POA: Diagnosis not present

## 2022-05-25 DIAGNOSIS — K219 Gastro-esophageal reflux disease without esophagitis: Secondary | ICD-10-CM | POA: Diagnosis not present

## 2022-05-25 DIAGNOSIS — E663 Overweight: Secondary | ICD-10-CM | POA: Diagnosis not present

## 2022-05-25 DIAGNOSIS — R413 Other amnesia: Secondary | ICD-10-CM | POA: Diagnosis not present

## 2022-05-25 DIAGNOSIS — R634 Abnormal weight loss: Secondary | ICD-10-CM | POA: Diagnosis not present

## 2022-05-25 DIAGNOSIS — I10 Essential (primary) hypertension: Secondary | ICD-10-CM | POA: Diagnosis not present

## 2022-05-25 NOTE — Telephone Encounter (Signed)
Patient waiting on information on stopping his ASA, prior to surgery. Please call..[phone]

## 2022-05-25 NOTE — Telephone Encounter (Signed)
Patient aware 3-5 days prior to surgery

## 2022-05-26 DIAGNOSIS — G4733 Obstructive sleep apnea (adult) (pediatric): Secondary | ICD-10-CM | POA: Diagnosis not present

## 2022-05-26 DIAGNOSIS — Z8673 Personal history of transient ischemic attack (TIA), and cerebral infarction without residual deficits: Secondary | ICD-10-CM | POA: Diagnosis not present

## 2022-05-26 DIAGNOSIS — E785 Hyperlipidemia, unspecified: Secondary | ICD-10-CM | POA: Diagnosis not present

## 2022-05-26 DIAGNOSIS — R251 Tremor, unspecified: Secondary | ICD-10-CM | POA: Diagnosis not present

## 2022-05-26 DIAGNOSIS — R413 Other amnesia: Secondary | ICD-10-CM | POA: Diagnosis not present

## 2022-05-26 DIAGNOSIS — K219 Gastro-esophageal reflux disease without esophagitis: Secondary | ICD-10-CM | POA: Diagnosis not present

## 2022-05-26 DIAGNOSIS — I10 Essential (primary) hypertension: Secondary | ICD-10-CM | POA: Diagnosis not present

## 2022-05-26 DIAGNOSIS — R634 Abnormal weight loss: Secondary | ICD-10-CM | POA: Diagnosis not present

## 2022-05-26 DIAGNOSIS — I493 Ventricular premature depolarization: Secondary | ICD-10-CM | POA: Diagnosis not present

## 2022-05-26 DIAGNOSIS — M170 Bilateral primary osteoarthritis of knee: Secondary | ICD-10-CM | POA: Diagnosis not present

## 2022-05-26 DIAGNOSIS — E663 Overweight: Secondary | ICD-10-CM | POA: Diagnosis not present

## 2022-05-30 ENCOUNTER — Telehealth: Payer: Self-pay | Admitting: *Deleted

## 2022-05-30 NOTE — Care Plan (Signed)
[facility] RNCM call to patient to discuss his upcoming Right total knee arthroplasty with Dr. Ninfa Linden on 05/31/22. He is an Ortho bundle patient through [facility] and is agreeable to case management. He lives with his spouse, who will be assisting at home after discharge. He has approximately 5 steps to get into his home. Will need to work on stairs while in hospital. Kym Groom is upstairs, but has arranged home to accommodate staying on lower level. He does have a RW, but wishes to have a 3in1/BSC. Anticipate HHPT will be needed after a short hospital stay. Referral made to [facility] after choice provided. Reviewed all post op care instructions. Will continue to follow for needs.

## 2022-05-30 NOTE — Anesthesia Preprocedure Evaluation (Signed)
Anesthesia Evaluation  Patient identified by MRN, date of birth, ID band Patient awake    Reviewed: Allergy & Precautions, NPO status , Patient's Chart, lab work & pertinent test results  History of Anesthesia Complications Negative for: history of anesthetic complications  Airway Mallampati: II  TM Distance: >3 FB Neck ROM: Full    Dental   Pulmonary sleep apnea , former smoker   Pulmonary exam normal        Cardiovascular hypertension, Normal cardiovascular exam  Echo 03/05/18: EF 55-60%, no RWMA, g1dd, trivial AR, trivial MR, normal RVSF, borderline pulm HTN (PASP 32)    Neuro/Psych CVA    GI/Hepatic negative GI ROS, Neg liver ROS,,,  Endo/Other  negative endocrine ROS    Renal/GU negative Renal ROS  negative genitourinary   Musculoskeletal  (+) Arthritis ,    Abdominal   Peds  Hematology  (+) Blood dyscrasia, anemia   Anesthesia Other Findings   Reproductive/Obstetrics                             Anesthesia Physical Anesthesia Plan  ASA: 3  Anesthesia Plan: Spinal   Post-op Pain Management: Regional block* and Tylenol PO (pre-op)*   Induction:   PONV Risk Score and Plan: 1 and Propofol infusion, Treatment may vary due to age or medical condition, Ondansetron and TIVA  Airway Management Planned: Nasal Cannula and Simple Face Mask  Additional Equipment: None  Intra-op Plan:   Post-operative Plan:   Informed Consent: I have reviewed the patients History and Physical, chart, labs and discussed the procedure including the risks, benefits and alternatives for the proposed anesthesia with the patient or authorized representative who has indicated his/her understanding and acceptance.       Plan Discussed with:   Anesthesia Plan Comments:        Anesthesia Quick Evaluation

## 2022-05-30 NOTE — Telephone Encounter (Signed)
Ortho bundle pre-op call completed. 

## 2022-05-31 ENCOUNTER — Other Ambulatory Visit: Payer: Self-pay

## 2022-05-31 ENCOUNTER — Encounter (HOSPITAL_COMMUNITY)
Admission: RE | Disposition: A | Discharge status: Home Health | Payer: Self-pay | Source: Home / Self Care | Attending: Orthopaedic Surgery

## 2022-05-31 ENCOUNTER — Inpatient Hospital Stay (HOSPITAL_COMMUNITY)
Admission: RE | Admit: 2022-05-31 | Discharge: 2022-06-02 | DRG: 470 | Disposition: A | Discharge status: Home Health | Payer: PPO | Attending: Orthopaedic Surgery | Admitting: Orthopaedic Surgery

## 2022-05-31 ENCOUNTER — Observation Stay (HOSPITAL_COMMUNITY): Payer: PPO

## 2022-05-31 ENCOUNTER — Encounter (HOSPITAL_COMMUNITY): Payer: Self-pay | Admitting: Orthopaedic Surgery

## 2022-05-31 ENCOUNTER — Ambulatory Visit (HOSPITAL_BASED_OUTPATIENT_CLINIC_OR_DEPARTMENT_OTHER): Payer: PPO | Admitting: Certified Registered"

## 2022-05-31 ENCOUNTER — Ambulatory Visit (HOSPITAL_COMMUNITY): Payer: PPO | Admitting: Certified Registered"

## 2022-05-31 DIAGNOSIS — I10 Essential (primary) hypertension: Secondary | ICD-10-CM

## 2022-05-31 DIAGNOSIS — Z91048 Other nonmedicinal substance allergy status: Secondary | ICD-10-CM

## 2022-05-31 DIAGNOSIS — G4730 Sleep apnea, unspecified: Secondary | ICD-10-CM | POA: Diagnosis not present

## 2022-05-31 DIAGNOSIS — M1711 Unilateral primary osteoarthritis, right knee: Principal | ICD-10-CM | POA: Diagnosis present

## 2022-05-31 DIAGNOSIS — Z87891 Personal history of nicotine dependence: Secondary | ICD-10-CM

## 2022-05-31 DIAGNOSIS — Z96611 Presence of right artificial shoulder joint: Secondary | ICD-10-CM | POA: Diagnosis present

## 2022-05-31 DIAGNOSIS — E785 Hyperlipidemia, unspecified: Secondary | ICD-10-CM | POA: Diagnosis present

## 2022-05-31 DIAGNOSIS — Z8249 Family history of ischemic heart disease and other diseases of the circulatory system: Secondary | ICD-10-CM | POA: Diagnosis not present

## 2022-05-31 DIAGNOSIS — G8918 Other acute postprocedural pain: Secondary | ICD-10-CM | POA: Diagnosis not present

## 2022-05-31 DIAGNOSIS — Z96651 Presence of right artificial knee joint: Secondary | ICD-10-CM | POA: Diagnosis not present

## 2022-05-31 DIAGNOSIS — Z8673 Personal history of transient ischemic attack (TIA), and cerebral infarction without residual deficits: Secondary | ICD-10-CM

## 2022-05-31 DIAGNOSIS — Z823 Family history of stroke: Secondary | ICD-10-CM

## 2022-05-31 DIAGNOSIS — G4731 Primary central sleep apnea: Secondary | ICD-10-CM | POA: Diagnosis present

## 2022-05-31 DIAGNOSIS — Z471 Aftercare following joint replacement surgery: Secondary | ICD-10-CM | POA: Diagnosis not present

## 2022-05-31 HISTORY — PX: TOTAL KNEE ARTHROPLASTY: SHX125

## 2022-05-31 SURGERY — ARTHROPLASTY, KNEE, TOTAL
Anesthesia: Regional | Site: Knee | Laterality: Right

## 2022-05-31 MED ORDER — ORAL CARE MOUTH RINSE: 15.0000 mL | Freq: Once | OROMUCOSAL | Status: AC

## 2022-05-31 MED ORDER — SODIUM CHLORIDE 0.9 % IV SOLN: INTRAVENOUS | Status: DC

## 2022-05-31 MED ORDER — ONDANSETRON HCL 4 MG/2ML IJ SOLN
INTRAMUSCULAR | Status: DC | PRN
  Administered 2022-05-31: 4 mg via INTRAVENOUS

## 2022-05-31 MED ORDER — PHENYLEPHRINE HCL (PRESSORS) 10 MG/ML IV SOLN
INTRAVENOUS | Status: DC | PRN
  Administered 2022-05-31 (×2): 80 ug via INTRAVENOUS

## 2022-05-31 MED ORDER — METHOCARBAMOL 1000 MG/10ML IJ SOLN
500.0000 mg | Freq: Four times a day (QID) | INTRAVENOUS | Status: DC | PRN
  Filled 2022-05-31: qty 5

## 2022-05-31 MED ORDER — POVIDONE-IODINE 10 % EX SWAB
2.0000 | Freq: Once | CUTANEOUS | Status: AC
  Administered 2022-05-31: 2 via TOPICAL

## 2022-05-31 MED ORDER — PHENYLEPHRINE 80 MCG/ML (10ML) SYRINGE FOR IV PUSH (FOR BLOOD PRESSURE SUPPORT)
PREFILLED_SYRINGE | INTRAVENOUS | Status: AC
  Filled 2022-05-31: qty 10

## 2022-05-31 MED ORDER — AMISULPRIDE (ANTIEMETIC) 5 MG/2ML IV SOLN: 10.0000 mg | Freq: Once | INTRAVENOUS | Status: DC | PRN

## 2022-05-31 MED ORDER — FENTANYL CITRATE (PF) 250 MCG/5ML IJ SOLN
INTRAMUSCULAR | Status: AC
  Filled 2022-05-31: qty 5

## 2022-05-31 MED ORDER — ROPIVACAINE HCL 5 MG/ML IJ SOLN
INTRAMUSCULAR | Status: DC | PRN
  Administered 2022-05-31: 30 mL via PERINEURAL

## 2022-05-31 MED ORDER — PROPOFOL 10 MG/ML IV BOLUS
INTRAVENOUS | Status: DC | PRN
  Administered 2022-05-31: 25 mg via INTRAVENOUS
  Administered 2022-05-31: 20 mg via INTRAVENOUS

## 2022-05-31 MED ORDER — SODIUM CHLORIDE 0.9 % IR SOLN
Status: DC | PRN
  Administered 2022-05-31: 500 mL

## 2022-05-31 MED ORDER — OXYCODONE HCL 5 MG PO TABS: 5.0000 mg | ORAL_TABLET | Freq: Once | ORAL | Status: DC | PRN

## 2022-05-31 MED ORDER — BUPIVACAINE IN DEXTROSE 0.75-8.25 % IT SOLN
INTRATHECAL | Status: DC | PRN
  Administered 2022-05-31: 1.6 mL via INTRATHECAL

## 2022-05-31 MED ORDER — TRANEXAMIC ACID-NACL 1000-0.7 MG/100ML-% IV SOLN
1000.0000 mg | INTRAVENOUS | Status: AC
  Administered 2022-05-31: 1000 mg via INTRAVENOUS
  Filled 2022-05-31: qty 100

## 2022-05-31 MED ORDER — DEXAMETHASONE SODIUM PHOSPHATE 10 MG/ML IJ SOLN
INTRAMUSCULAR | Status: AC
  Filled 2022-05-31: qty 1

## 2022-05-31 MED ORDER — HYDROMORPHONE HCL 2 MG PO TABS: 2.0000 mg | ORAL_TABLET | ORAL | Status: DC | PRN

## 2022-05-31 MED ORDER — PHENOL 1.4 % MT LIQD: 1.0000 | OROMUCOSAL | Status: DC | PRN

## 2022-05-31 MED ORDER — ONDANSETRON HCL 4 MG/2ML IJ SOLN: 4.0000 mg | Freq: Four times a day (QID) | INTRAMUSCULAR | Status: DC | PRN

## 2022-05-31 MED ORDER — VERAPAMIL HCL ER 240 MG PO TBCR
240.0000 mg | EXTENDED_RELEASE_TABLET | Freq: Every day | ORAL | Status: DC
  Administered 2022-06-01: 240 mg via ORAL
  Filled 2022-05-31 (×3): qty 1

## 2022-05-31 MED ORDER — PROPOFOL 500 MG/50ML IV EMUL
INTRAVENOUS | Status: DC | PRN
  Administered 2022-05-31: 125 ug/kg/min via INTRAVENOUS

## 2022-05-31 MED ORDER — MIDAZOLAM HCL 2 MG/2ML IJ SOLN
INTRAMUSCULAR | Status: AC
  Filled 2022-05-31: qty 2

## 2022-05-31 MED ORDER — ASPIRIN 81 MG PO CHEW
81.0000 mg | CHEWABLE_TABLET | Freq: Two times a day (BID) | ORAL | Status: DC
  Administered 2022-05-31 – 2022-06-01 (×3): 81 mg via ORAL
  Filled 2022-05-31 (×3): qty 1

## 2022-05-31 MED ORDER — CHLORHEXIDINE GLUCONATE 0.12 % MT SOLN
15.0000 mL | Freq: Once | OROMUCOSAL | Status: AC
  Administered 2022-05-31: 15 mL via OROMUCOSAL
  Filled 2022-05-31: qty 15

## 2022-05-31 MED ORDER — FENTANYL CITRATE (PF) 250 MCG/5ML IJ SOLN
INTRAMUSCULAR | Status: DC | PRN
  Administered 2022-05-31 (×3): 50 ug via INTRAVENOUS

## 2022-05-31 MED ORDER — ONDANSETRON HCL 4 MG PO TABS: 4.0000 mg | ORAL_TABLET | Freq: Four times a day (QID) | ORAL | Status: DC | PRN

## 2022-05-31 MED ORDER — METHOCARBAMOL 500 MG PO TABS
500.0000 mg | ORAL_TABLET | Freq: Four times a day (QID) | ORAL | Status: DC | PRN
  Filled 2022-05-31: qty 1

## 2022-05-31 MED ORDER — HYDROMORPHONE HCL 1 MG/ML IJ SOLN: 0.5000 mg | INTRAMUSCULAR | Status: DC | PRN

## 2022-05-31 MED ORDER — BUPIVACAINE-EPINEPHRINE 0.25% -1:200000 IJ SOLN
INTRAMUSCULAR | Status: DC | PRN
  Administered 2022-05-31: 30 mL

## 2022-05-31 MED ORDER — CEFAZOLIN SODIUM-DEXTROSE 2-4 GM/100ML-% IV SOLN
2.0000 g | INTRAVENOUS | Status: AC
  Administered 2022-05-31: 2 g via INTRAVENOUS
  Filled 2022-05-31: qty 100

## 2022-05-31 MED ORDER — ONDANSETRON HCL 4 MG/2ML IJ SOLN
INTRAMUSCULAR | Status: AC
  Filled 2022-05-31: qty 2

## 2022-05-31 MED ORDER — ONDANSETRON HCL 4 MG/2ML IJ SOLN: 4.0000 mg | Freq: Once | INTRAMUSCULAR | Status: DC | PRN

## 2022-05-31 MED ORDER — MENTHOL 3 MG MT LOZG: 1.0000 | LOZENGE | OROMUCOSAL | Status: DC | PRN

## 2022-05-31 MED ORDER — METOCLOPRAMIDE HCL 5 MG/ML IJ SOLN: 5.0000 mg | Freq: Three times a day (TID) | INTRAMUSCULAR | Status: DC | PRN

## 2022-05-31 MED ORDER — OXYCODONE HCL 5 MG PO TABS
5.0000 mg | ORAL_TABLET | ORAL | Status: DC | PRN
  Administered 2022-05-31 – 2022-06-01 (×3): 5 mg via ORAL
  Filled 2022-05-31: qty 2
  Filled 2022-05-31: qty 1

## 2022-05-31 MED ORDER — PANTOPRAZOLE SODIUM 40 MG PO TBEC
40.0000 mg | DELAYED_RELEASE_TABLET | Freq: Every day | ORAL | Status: DC
  Administered 2022-05-31 – 2022-06-01 (×2): 40 mg via ORAL
  Filled 2022-05-31 (×2): qty 1

## 2022-05-31 MED ORDER — ACETAMINOPHEN 500 MG PO TABS
1000.0000 mg | ORAL_TABLET | Freq: Once | ORAL | Status: AC
  Administered 2022-05-31: 1000 mg via ORAL
  Filled 2022-05-31: qty 2

## 2022-05-31 MED ORDER — LACTATED RINGERS IV SOLN: INTRAVENOUS | Status: DC

## 2022-05-31 MED ORDER — DEXAMETHASONE SODIUM PHOSPHATE 10 MG/ML IJ SOLN
INTRAMUSCULAR | Status: DC | PRN
  Administered 2022-05-31: 10 mg via INTRAVENOUS

## 2022-05-31 MED ORDER — FENTANYL CITRATE (PF) 100 MCG/2ML IJ SOLN
25.0000 ug | INTRAMUSCULAR | Status: DC | PRN
  Administered 2022-05-31 (×2): 50 ug via INTRAVENOUS

## 2022-05-31 MED ORDER — CEFAZOLIN SODIUM-DEXTROSE 1-4 GM/50ML-% IV SOLN
1.0000 g | Freq: Four times a day (QID) | INTRAVENOUS | Status: AC
  Administered 2022-05-31: 1 g via INTRAVENOUS
  Filled 2022-05-31: qty 50

## 2022-05-31 MED ORDER — ATORVASTATIN CALCIUM 10 MG PO TABS
20.0000 mg | ORAL_TABLET | Freq: Every day | ORAL | Status: DC
  Administered 2022-05-31 – 2022-06-01 (×2): 20 mg via ORAL
  Filled 2022-05-31 (×2): qty 2

## 2022-05-31 MED ORDER — ALUM & MAG HYDROXIDE-SIMETH 200-200-20 MG/5ML PO SUSP: 30.0000 mL | ORAL | Status: DC | PRN

## 2022-05-31 MED ORDER — OXYCODONE HCL 5 MG/5ML PO SOLN: 5.0000 mg | Freq: Once | ORAL | Status: DC | PRN

## 2022-05-31 MED ORDER — MIDAZOLAM HCL 2 MG/2ML IJ SOLN
INTRAMUSCULAR | Status: DC | PRN
  Administered 2022-05-31: 2 mg via INTRAVENOUS

## 2022-05-31 MED ORDER — DIPHENHYDRAMINE HCL 12.5 MG/5ML PO ELIX: 12.5000 mg | ORAL_SOLUTION | ORAL | Status: DC | PRN

## 2022-05-31 MED ORDER — PROPOFOL 1000 MG/100ML IV EMUL
INTRAVENOUS | Status: AC
  Filled 2022-05-31: qty 200

## 2022-05-31 MED ORDER — METOCLOPRAMIDE HCL 5 MG PO TABS: 5.0000 mg | ORAL_TABLET | Freq: Three times a day (TID) | ORAL | Status: DC | PRN

## 2022-05-31 MED ORDER — LACTATED RINGERS IV SOLN: INTRAVENOUS | Status: DC | PRN

## 2022-05-31 MED ORDER — OXYCODONE HCL 5 MG PO TABS
ORAL_TABLET | ORAL | Status: AC
  Filled 2022-05-31: qty 1

## 2022-05-31 MED ORDER — BUPIVACAINE-EPINEPHRINE (PF) 0.25% -1:200000 IJ SOLN
INTRAMUSCULAR | Status: AC
  Filled 2022-05-31: qty 30

## 2022-05-31 MED ORDER — ACETAMINOPHEN 325 MG PO TABS
325.0000 mg | ORAL_TABLET | Freq: Four times a day (QID) | ORAL | Status: DC | PRN
  Administered 2022-06-01 – 2022-06-02 (×2): 650 mg via ORAL
  Filled 2022-05-31 (×2): qty 2

## 2022-05-31 MED ORDER — FENTANYL CITRATE (PF) 100 MCG/2ML IJ SOLN
INTRAMUSCULAR | Status: AC
  Filled 2022-05-31: qty 2

## 2022-05-31 MED ORDER — 0.9 % SODIUM CHLORIDE (POUR BTL) OPTIME
TOPICAL | Status: DC | PRN
  Administered 2022-05-31: 1000 mL

## 2022-05-31 MED ORDER — DOCUSATE SODIUM 100 MG PO CAPS
100.0000 mg | ORAL_CAPSULE | Freq: Two times a day (BID) | ORAL | Status: DC
  Administered 2022-05-31 – 2022-06-01 (×4): 100 mg via ORAL
  Filled 2022-05-31 (×4): qty 1

## 2022-05-31 SURGICAL SUPPLY — 73 items
BAG COUNTER SPONGE SURGICOUNT (BAG) ×1 IMPLANT
BAG SPNG CNTER NS LX DISP (BAG) ×1
BANDAGE ESMARK 6X9 LF (GAUZE/BANDAGES/DRESSINGS) ×1 IMPLANT
BLADE SAG 18X100X1.27 (BLADE) ×1 IMPLANT
BNDG CMPR 9X6 STRL LF SNTH (GAUZE/BANDAGES/DRESSINGS) ×1
BNDG ELASTIC 6X5.8 VLCR STR LF (GAUZE/BANDAGES/DRESSINGS) ×2 IMPLANT
BNDG ESMARK 6X9 LF (GAUZE/BANDAGES/DRESSINGS) ×1
BOWL SMART MIX CTS (DISPOSABLE) ×1 IMPLANT
COMP TIB KNEE PS G 0 RT (Joint) ×1 IMPLANT
COMPONENT TIB KNEE PS G 0 RT (Joint) IMPLANT
COOLER ICEMAN CLASSIC (MISCELLANEOUS) IMPLANT
COVER SURGICAL LIGHT HANDLE (MISCELLANEOUS) ×1 IMPLANT
CUFF TOURN SGL QUICK 34 (TOURNIQUET CUFF) ×1
CUFF TOURN SGL QUICK 42 (TOURNIQUET CUFF) IMPLANT
CUFF TRNQT CYL 34X4.125X (TOURNIQUET CUFF) ×1 IMPLANT
DRAPE EXTREMITY T 121X128X90 (DISPOSABLE) ×1 IMPLANT
DRAPE HALF SHEET 40X57 (DRAPES) ×1 IMPLANT
DRAPE U-SHAPE 47X51 STRL (DRAPES) ×1 IMPLANT
DRSG XEROFORM 1X8 (GAUZE/BANDAGES/DRESSINGS) IMPLANT
DURAPREP 26ML APPLICATOR (WOUND CARE) ×1 IMPLANT
ELECT CAUTERY BLADE 6.4 (BLADE) ×1 IMPLANT
ELECT REM PT RETURN 9FT ADLT (ELECTROSURGICAL) ×1
ELECTRODE REM PT RTRN 9FT ADLT (ELECTROSURGICAL) ×1 IMPLANT
FACESHIELD WRAPAROUND (MASK) ×2 IMPLANT
FACESHIELD WRAPAROUND OR TEAM (MASK) ×2 IMPLANT
GAUZE PAD ABD 8X10 STRL (GAUZE/BANDAGES/DRESSINGS) ×1 IMPLANT
GAUZE SPONGE 4X4 12PLY STRL (GAUZE/BANDAGES/DRESSINGS) ×1 IMPLANT
GAUZE XEROFORM 1X8 LF (GAUZE/BANDAGES/DRESSINGS) ×1 IMPLANT
GLOVE BIOGEL PI IND STRL 8 (GLOVE) ×2 IMPLANT
GLOVE ORTHO TXT STRL SZ7.5 (GLOVE) ×1 IMPLANT
GLOVE SURG ORTHO 8.0 STRL STRW (GLOVE) ×1 IMPLANT
GOWN STRL REUS W/ TWL LRG LVL3 (GOWN DISPOSABLE) IMPLANT
GOWN STRL REUS W/ TWL XL LVL3 (GOWN DISPOSABLE) ×2 IMPLANT
GOWN STRL REUS W/TWL LRG LVL3 (GOWN DISPOSABLE)
GOWN STRL REUS W/TWL XL LVL3 (GOWN DISPOSABLE) ×2
HANDPIECE INTERPULSE COAX TIP (DISPOSABLE) ×1
HDLS TROCR DRIL PIN KNEE 75 (PIN) ×4
IMMOBILIZER KNEE 22 UNIV (SOFTGOODS) ×1 IMPLANT
INSERT TIB ASF PS 8-11 GH RT (Insert) IMPLANT
IV NS 1000ML (IV SOLUTION) ×1
IV NS 1000ML BAXH (IV SOLUTION) ×1 IMPLANT
KIT BASIN OR (CUSTOM PROCEDURE TRAY) ×1 IMPLANT
KIT TURNOVER KIT B (KITS) ×1 IMPLANT
MANIFOLD NEPTUNE II (INSTRUMENTS) ×1 IMPLANT
NDL 18GX1X1/2 (RX/OR ONLY) (NEEDLE) IMPLANT
NEEDLE 18GX1X1/2 (RX/OR ONLY) (NEEDLE) IMPLANT
NS IRRIG 1000ML POUR BTL (IV SOLUTION) ×1 IMPLANT
PACK TOTAL JOINT (CUSTOM PROCEDURE TRAY) ×1 IMPLANT
PAD ARMBOARD 7.5X6 YLW CONV (MISCELLANEOUS) ×1 IMPLANT
PAD ORTHO SHOULDER 7X19 LRG (SOFTGOODS) IMPLANT
PADDING CAST COTTON 6X4 STRL (CAST SUPPLIES) ×1 IMPLANT
PATELLA STD SZ 38X10 (Miscellaneous) IMPLANT
PIN DRILL HDLS TROCAR 75 4PK (PIN) IMPLANT
PROS FEM KNEE PS STD 9 RT (Joint) ×1 IMPLANT
PROSTHESIS FEM KNEE PS STD9 RT (Joint) IMPLANT
SCREW FEMALE HEX FIX 25X2.5 (ORTHOPEDIC DISPOSABLE SUPPLIES) IMPLANT
SET HNDPC FAN SPRY TIP SCT (DISPOSABLE) ×1 IMPLANT
SET PAD KNEE POSITIONER (MISCELLANEOUS) ×1 IMPLANT
STAPLER SKIN 35 WIDE (STAPLE) IMPLANT
STAPLER VISISTAT 35W (STAPLE) ×1 IMPLANT
SUCTION FRAZIER HANDLE 10FR (MISCELLANEOUS) ×1
SUCTION TUBE FRAZIER 10FR DISP (MISCELLANEOUS) ×1 IMPLANT
SUT VIC AB 0 CT1 27 (SUTURE) ×1
SUT VIC AB 0 CT1 27XBRD ANBCTR (SUTURE) ×1 IMPLANT
SUT VIC AB 1 CT1 27 (SUTURE) ×2
SUT VIC AB 1 CT1 27XBRD ANBCTR (SUTURE) ×2 IMPLANT
SUT VIC AB 2-0 CT1 27 (SUTURE) ×2
SUT VIC AB 2-0 CT1 TAPERPNT 27 (SUTURE) ×2 IMPLANT
SYR 50ML LL SCALE MARK (SYRINGE) IMPLANT
TOWEL GREEN STERILE (TOWEL DISPOSABLE) ×1 IMPLANT
TOWEL GREEN STERILE FF (TOWEL DISPOSABLE) ×1 IMPLANT
TRAY CATH 16FR W/PLASTIC CATH (SET/KITS/TRAYS/PACK) IMPLANT
WRAP KNEE MAXI GEL POST OP (GAUZE/BANDAGES/DRESSINGS) ×1 IMPLANT

## 2022-05-31 NOTE — H&P (Signed)
TOTAL KNEE ADMISSION H&P  Patient is being admitted for right total knee arthroplasty.  Subjective:  Chief Complaint:right knee pain.  HPI: Bryan Hart, 71 y.o. male, has a history of pain and functional disability in the right knee due to arthritis and has failed non-surgical conservative treatments for greater than 12 weeks to includeNSAID's and/or analgesics, corticosteriod injections, viscosupplementation injections, flexibility and strengthening excercises, and activity modification.  Onset of symptoms was gradual, starting 5 years ago with gradually worsening course since that time. The patient noted no past surgery on the right knee(s).  Patient currently rates pain in the right knee(s) at 10 out of 10 with activity. Patient has night pain, worsening of pain with activity and weight bearing, pain that interferes with activities of daily living, pain with passive range of motion, crepitus, and joint swelling.  Patient has evidence of subchondral sclerosis, periarticular osteophytes, and joint space narrowing by imaging studies. There is no active infection.  Patient Active Problem List   Diagnosis Date Noted   Unilateral primary osteoarthritis, left knee 03/28/2022   Unilateral primary osteoarthritis, right knee 03/28/2022   Primary osteoarthritis of left knee 04/30/2020   Effusion, right knee 03/31/2020   Degenerative arthritis of right knee 03/31/2020   Bilateral primary osteoarthritis of knee 12/27/2018   AV block 07/29/2018   Central sleep apnea 07/29/2018   Cerebrovascular accident [facility]) 07/29/2018   Primary localized osteoarthrosis, shoulder region 01/13/2012   Past Medical History:  Diagnosis Date   Arthritis    HLD (hyperlipidemia)    Hypertension    dr  ed green      stress test   12 yrs ago   PVC's (premature ventricular contractions) 12 years ago   stress test done   Sleep apnea    Stroke [facility])    minor stroke 01/2018    Past Surgical History:  Procedure  Laterality Date   ANKLE ARTHROSCOPY  06/27/2008   JOINT REPLACEMENT  06/27/2009   rt shoulder rotator cuff repair   knee surgeries  [id number]   x2   SHOULDER HEMI-ARTHROPLASTY  01/12/2012   Procedure: SHOULDER HEMI-ARTHROPLASTY;  Surgeon: Nita Sells, MD;  Location: [facility];  Service: Orthopedics;  Laterality: Right;   SHOULDER SURGERY Right 07/2021    Current Facility-Administered Medications  Medication Dose Route Frequency Provider Last Rate Last Admin   ceFAZolin (ANCEF) IVPB 2g/100 mL premix  2 g Intravenous On Call to OR Pete Pelt, PA-C       lactated ringers infusion   Intravenous Continuous Myrtie Soman, MD       tranexamic acid (CYKLOKAPRON) IVPB 1,000 mg  1,000 mg Intravenous To OR Pete Pelt, PA-C       Allergies  Allergen Reactions   Adhesive [Tape] Rash    Social History   Tobacco Use   Smoking status: Former    Packs/day: 0.50    Years: 4.00    Total pack years: 2.00    Types: Cigarettes    Quit date: 1975    Years since quitting: 48.9   Smokeless tobacco: Never  Substance Use Topics   Alcohol use: Yes    Alcohol/week: 1.0 - 2.0 standard drink of alcohol    Types: 1 - 2 Cans of beer per week    Comment: daily    Family History  Problem Relation Age of Onset   Heart disease Father    Hypertension Father    Heart attack Brother    Hypertension Brother  Stroke Paternal Grandmother    Heart attack Maternal Uncle    Heart disease Brother    Colon cancer Neg Hx      Review of Systems  Musculoskeletal:  Positive for gait problem and joint swelling.  All other systems reviewed and are negative.   Objective:  Physical Exam Vitals reviewed.  Constitutional:      Appearance: Normal appearance. He is normal weight.  HENT:     Head: Normocephalic and atraumatic.  Eyes:     Extraocular Movements: Extraocular movements intact.     Pupils: Pupils are equal, round, and reactive to light.  Cardiovascular:     Rate and Rhythm:  Normal rate.  Pulmonary:     Effort: Pulmonary effort is normal.     Breath sounds: Normal breath sounds.  Abdominal:     Palpations: Abdomen is soft.  Musculoskeletal:     Cervical back: Normal range of motion and neck supple.     Right knee: Effusion, bony tenderness and crepitus present. Decreased range of motion. Tenderness present over the medial joint line and lateral joint line. Abnormal alignment and abnormal meniscus.  Neurological:     Mental Status: He is alert and oriented to person, place, and time.  Psychiatric:        Behavior: Behavior normal.     Vital signs in last 24 hours: Temp:  [97.9 F (36.6 C)] 97.9 F (36.6 C) (12/05 0604) Pulse Rate:  [65] 65 (12/05 0604) Resp:  [18] 18 (12/05 0604) BP: (136)/(88) 136/88 (12/05 0604) SpO2:  [97 %] 97 % (12/05 0604) Weight:  [79.4 kg] 79.4 kg (12/05 0604)  Labs:   Estimated body mass index is 23.73 kg/m as calculated from the following:   Height as of this encounter: 6' (1.829 m).   Weight as of this encounter: 79.4 kg.   Imaging Review Plain radiographs demonstrate severe degenerative joint disease of the right knee(s). The overall alignment issignificant varus. The bone quality appears to be good for age and reported activity level.      Assessment/Plan:  End stage arthritis, right knee   The patient history, physical examination, clinical judgment of the provider and imaging studies are consistent with end stage degenerative joint disease of the right knee(s) and total knee arthroplasty is deemed medically necessary. The treatment options including medical management, injection therapy arthroscopy and arthroplasty were discussed at length. The risks and benefits of total knee arthroplasty were presented and reviewed. The risks due to aseptic loosening, infection, stiffness, patella tracking problems, thromboembolic complications and other imponderables were discussed. The patient acknowledged the explanation,  agreed to proceed with the plan and consent was signed. Patient is being admitted for inpatient treatment for surgery, pain control, PT, OT, prophylactic antibiotics, VTE prophylaxis, progressive ambulation and ADL's and discharge planning. The patient is planning to be discharged home with home health services

## 2022-05-31 NOTE — Anesthesia Procedure Notes (Signed)
Spinal  Patient location during procedure: OR Reason for block: surgical anesthesia Staffing Performed: anesthesiologist  Anesthesiologist: Lidia Collum, MD Performed by: Lidia Collum, MD Authorized by: Lidia Collum, MD   Preanesthetic Checklist Completed: patient identified, IV checked, risks and benefits discussed, surgical consent, monitors and equipment checked, pre-op evaluation and timeout performed Spinal Block Patient position: sitting Prep: DuraPrep and site prepped and draped Patient monitoring: continuous pulse ox, blood pressure and heart rate Approach: midline Location: L3-4 Injection technique: single-shot Needle Needle type: Pencan  Needle gauge: 24 G Needle length: 10 cm Assessment Events: CSF return Additional Notes Functioning IV was confirmed and monitors were applied. Sterile prep and drape, including hand hygiene and sterile gloves were used. The patient was positioned and the spine was prepped. The skin was anesthetized with lidocaine.  Free flow of clear CSF was obtained prior to injecting local anesthetic into the CSF. The needle was carefully withdrawn. The patient tolerated the procedure well.

## 2022-05-31 NOTE — Anesthesia Postprocedure Evaluation (Signed)
Anesthesia Post Note  Patient: Bryan Hart  Procedure(s) Performed: RIGHT TOTAL KNEE ARTHROPLASTY (Right: Knee)     Patient location during evaluation: PACU Anesthesia Type: Spinal Level of consciousness: oriented and awake and alert Pain management: pain level controlled Vital Signs Assessment: post-procedure vital signs reviewed and stable Respiratory status: spontaneous breathing, respiratory function stable and nonlabored ventilation Cardiovascular status: blood pressure returned to baseline and stable Postop Assessment: no headache, no backache, no apparent nausea or vomiting and spinal receding Anesthetic complications: no   No notable events documented.  Last Vitals:  Vitals:   05/31/22 1200 05/31/22 1351  BP: 107/75 112/69  Pulse: (!) 59 64  Resp: 16 18  Temp:    SpO2: 97% 100%    Last Pain:  Vitals:   05/31/22 1400  TempSrc:   PainSc: 0-No pain                 Lidia Collum

## 2022-05-31 NOTE — Evaluation (Signed)
Physical Therapy Evaluation Patient Details Name: Bryan Hart MRN: [id number] DOB: [birth year] Today's Date: 05/31/2022  History of Present Illness  Pt is 71 yo male s/p R TKA on 05/31/22.  Pt with hx incluing OA, sleep apnea, CVA, HTN, Shoulder surgery, prior knee surgeries  Clinical Impression  Pt is s/p TKA resulting in the deficits listed below (see PT Problem List). At baseline, pt reports independent.  He lives with his wife in split level home.  Today, pt requiring increased time for transfers and to respond to questions. Unsure baseline cognition -family not present.  Pt did have orthostatic hypotension with ambulation with lightheadedness and further decreased cognition.  He required min A transfers but mod-max cues. Ambulated 80' with RW.  Pt expected to progress well as orthostatic hypotension resolves. Pt will benefit from skilled PT to increase their independence and safety with mobility to allow discharge to the venue listed below.         Recommendations for follow up therapy are one component of a multi-disciplinary discharge planning process, led by the attending physician.  Recommendations may be updated based on patient status, additional functional criteria and insurance authorization.  Follow Up Recommendations Follow physician's recommendations for discharge plan and follow up therapies      Assistance Recommended at Discharge Frequent or constant Supervision/Assistance  Patient can return home with the following  A little help with bathing/dressing/bathroom;A little help with walking and/or transfers;Assistance with cooking/housework;Help with stairs or ramp for entrance    Equipment Recommendations None recommended by PT  Recommendations for Other Services       Functional Status Assessment Patient has had a recent decline in their functional status and demonstrates the ability to make significant improvements in function in a reasonable and predictable amount of  time.     Precautions / Restrictions Precautions Precautions: Fall Required Braces or Orthoses: Knee Immobilizer - Right Knee Immobilizer - Right: Discontinue once straight leg raise with < 10 degree lag Restrictions Weight Bearing Restrictions: Yes RLE Weight Bearing: Weight bearing as tolerated      Mobility  Bed Mobility Overal bed mobility: Needs Assistance Bed Mobility: Supine to Sit, Sit to Supine     Supine to sit: Min assist Sit to supine: Min assist   General bed mobility comments: increased time and light min A for R LE    Transfers Overall transfer level: Needs assistance Equipment used: Rolling walker (2 wheels) Transfers: Sit to/from Stand Sit to Stand: Min assist           General transfer comment: Cues for hand placement and R LE managment with min A to rise    Ambulation/Gait Ambulation/Gait assistance: Min assist Gait Distance (Feet): 28 Feet Assistive device: Rolling walker (2 wheels) Gait Pattern/deviations: Step-to pattern, Decreased stride length Gait velocity: decreased     General Gait Details: Mod-max cues for sequencing (particularly with turns and backing to bed); also cued for safety.  Pt got near bed and reports increasing lightheadedness. Pt wanting to take break but encouraged/cued to go ahead and back to bed to sit - max cues.  Stairs            Wheelchair Mobility    Modified Rankin (Stroke Patients Only)       Balance Overall balance assessment: Needs assistance Sitting-balance support: No upper extremity supported Sitting balance-Leahy Scale: Good     Standing balance support: Bilateral upper extremity supported Standing balance-Leahy Scale: Poor Standing balance comment: requiring RW  Pertinent Vitals/Pain Pain Assessment Pain Assessment: 0-10 Pain Score: 1  Pain Location: Knee Pain Descriptors / Indicators:  (stiff) Pain Intervention(s): Limited activity within  patient's tolerance, Monitored during session    Home Living Family/patient expects to be discharged to:: Private residence Living Arrangements: Spouse/significant other Available Help at Discharge: Family;Available 24 hours/day Type of Home: House Home Access: Stairs to enter Entrance Stairs-Rails: None Entrance Stairs-Number of Steps: 2 Alternate Level Stairs-Number of Steps: split level; bathrooms on every level; 5-7 steps at a time with rail mostly on R ; unsure of exact details - pt difficulty recalling, states "complicated set up" Home Layout: Multi-level Home Equipment: Crutches;Cane - single point;Rolling Walker (2 wheels)      Prior Function Prior Level of Function : Independent/Modified Independent;Driving             Mobility Comments: could ambulate in community without AD ADLs Comments: independent     Hand Dominance   Dominant Hand: Left    Extremity/Trunk Assessment   Upper Extremity Assessment Upper Extremity Assessment: Overall WFL for tasks assessed    Lower Extremity Assessment Lower Extremity Assessment: LLE deficits/detail;RLE deficits/detail RLE Deficits / Details: Expected post op changes; ROM 10 to 80 degrees (in ACE wrap); MMT: ankle 5/5, knee 3/5 , hip 3/5 LLE Deficits / Details: ROM WFL; MMT 5/5    Cervical / Trunk Assessment Cervical / Trunk Assessment: Normal  Communication   Communication: No difficulties  Cognition Arousal/Alertness: Awake/alert Behavior During Therapy: WFL for tasks assessed/performed Overall Cognitive Status: No family/caregiver present to determine baseline cognitive functioning                                 General Comments: Pt overall oriented and follows commands but requiring increased time. Increased time to answer questions and respond.  Significant amount of time required for pt to describe home set up/stairs and still not exact details.  Pt had increased trouble completing sentences when  sitting (did note orthostatic bp after ambulation), improved when returned to supine        General Comments General comments (skin integrity, edema, etc.):  Orthostatic BP BP supine 107/75, sitting 106/69, sitting post walk 81/64, return to supine 112/72.  RN notified.  Pt did have lightheadedness and decreased cognition/further slowed during ambulation    Exercises     Assessment/Plan    PT Assessment Patient needs continued PT services  PT Problem List Decreased strength;Cardiopulmonary status limiting activity;Pain;Decreased range of motion;Decreased cognition;Decreased activity tolerance;Decreased knowledge of use of DME;Decreased balance;Decreased safety awareness;Decreased mobility;Decreased knowledge of precautions       PT Treatment Interventions DME instruction;Therapeutic exercise;Gait training;Balance training;Stair training;Functional mobility training;Therapeutic activities;Patient/family education;Cognitive remediation;Neuromuscular re-education;Modalities    PT Goals (Current goals can be found in the Care Plan section)  Acute Rehab PT Goals Patient Stated Goal: return home PT Goal Formulation: With patient Time For Goal Achievement: 06/14/22 Potential to Achieve Goals: Good    Frequency 7X/week     Co-evaluation               AM-PAC PT "6 Clicks" Mobility  Outcome Measure Help needed turning from your back to your side while in a flat bed without using bedrails?: A Little Help needed moving from lying on your back to sitting on the side of a flat bed without using bedrails?: A Little Help needed moving to and from a bed to a chair (including a wheelchair)?: A Little  Help needed standing up from a chair using your arms (e.g., wheelchair or bedside chair)?: A Little Help needed to walk in hospital room?: A Lot Help needed climbing 3-5 steps with a railing? : Total 6 Click Score: 15    End of Session Equipment Utilized During Treatment: Gait  belt Activity Tolerance: Treatment limited secondary to medical complications (Comment) (orthostatic hypotension) Patient left: in bed;with call bell/phone within reach;with nursing/sitter in room Nurse Communication: Mobility status PT Visit Diagnosis: Other abnormalities of gait and mobility (R26.89);Muscle weakness (generalized) (M62.81)    Time: 2409-7353 PT Time Calculation (min) (ACUTE ONLY): 35 min   Charges:   PT Evaluation $PT Eval Moderate Complexity: 1 Mod PT Treatments $Therapeutic Activity: 8-22 mins        Abran Richard, PT Acute Rehab [facility] [phone]   Karlton Lemon 05/31/2022, 1:51 PM

## 2022-05-31 NOTE — Anesthesia Procedure Notes (Signed)
Anesthesia Regional Block: Adductor canal block   Pre-Anesthetic Checklist: , timeout performed,  Correct Patient, Correct Site, Correct Laterality,  Correct Procedure, Correct Position, site marked,  Risks and benefits discussed,  Surgical consent,  Pre-op evaluation,  At surgeon's request and post-op pain management  Laterality: Right  Prep: chloraprep       Needles:  Injection technique: Single-shot  Needle Type: Echogenic Stimulator Needle     Needle Length: 10cm  Needle Gauge: 20     Additional Needles:   Procedures:,,,, ultrasound used (permanent image in chart),,    Narrative:  Start time: 05/31/2022 7:04 AM End time: 05/31/2022 7:08 AM Injection made incrementally with aspirations every 5 mL.  Performed by: Personally  Anesthesiologist: Lidia Collum, MD  Additional Notes: Standard monitors applied. Skin prepped. Good needle visualization with ultrasound. Injection made in 5cc increments with no resistance to injection. Patient tolerated the procedure well.

## 2022-05-31 NOTE — Transfer of Care (Signed)
Immediate Anesthesia Transfer of Care Note  Patient: Bryan Hart  Procedure(s) Performed: RIGHT TOTAL KNEE ARTHROPLASTY (Right: Knee)  Patient Location: PACU  Anesthesia Type:General, Regional, and Spinal  Level of Consciousness: awake, alert , oriented, and patient cooperative  Airway & Oxygen Therapy: Patient Spontanous Breathing and Patient connected to nasal cannula oxygen  Post-op Assessment: Report given to RN and Post -op Vital signs reviewed and stable  Post vital signs: stable  Last Vitals:  Vitals Value Taken Time  BP 101/67 05/31/22 0945  Temp    Pulse 63 05/31/22 0947  Resp 14 05/31/22 0947  SpO2 96 % 05/31/22 0947  Vitals shown include unvalidated device data.  Last Pain:  Vitals:   05/31/22 0614  TempSrc:   PainSc: 0-No pain     Patient c/o mild pain in knee -- 50 mcg of Fentanyl administered upon arrival    Complications: No notable events documented.

## 2022-05-31 NOTE — Op Note (Signed)
Operative Note  Date of operation: 05/31/2022 Preoperative diagnosis: Right knee severe osteoarthritis Postoperative diagnosis: Same  Procedure: Right press-fit total knee arthroplasty  Implants: Biomet/Zimmer persona knee system with size 9 right CR press-fit femur, size G right press-fit tibial tray, 12 mm medial congruent right polythene insert, 38 mm patella button  Surgeon: Lind Guest. Ninfa Linden, MD Assistant: Benita Stabile, PA-C  Anesthesia: #1 right lower extremity regional block, #2 spinal, #3 local EBL: Less than 100 cc Tourniquet time: Just under 1 hour Complications: None Antibiotics: 2 g IV Ancef  Indications: The patient is a very active 71 year old gentleman with severe and stage arthritis involving his right knee.  He has been having knee pain for many years now but its gotten to the point where it is detrimentally affecting his mobility, his quality life and his actives day living.  His x-rays show significant varus malalignment the knee and complete loss of the cartilage in all 3 compartments.  He has tried failed all forms conservative treatment.  At this point he wishes to proceed with a knee replacement.  We had a long thorough discussion about the risk of acute blood loss anemia, nerve vessel injury, fracture, infection, DVT, implant failure and wound healing issues.  We talked about her goals being hopefully decrease pain, improve mobility and overall proved quality of life.  Procedure description: After informed consent was obtained and the appropriate right knee was marked, a regional right lower extremity block was obtained by anesthesia holding room and the patient is brought to the operating room and set up on the operative table where spinal anesthesia was obtained.  He was laid in supine position on the operating table and a Foley catheter was placed.  A nonsterile tourniquet placed around his upper right thigh and his right thigh, knee, leg, ankle and foot were  prepped and draped with DuraPrep and sterile drapes.  A timeout was called he was identified as the correct patient the correct right knee.  An Esmarch was used to wrap out the leg and the tourniquet was inflated to 3 mm of pressure.  A direct midline incision was then made over the patella and carried proximally distally.  Dissection was carried down to the knee joint and a medial parapatellar arthrotomy was carried out finding a large joint effusion.  With the knee in a flexed position we found complete cartilage wear of the medial compartment the knee with varus malalignment.  There is osteophytes in all 3 compartments and significant lateral wear as well as patellofemoral wear.  We removed osteophytes in all 3 compartments and performed a lateral release.  We remove remnants of the ACL and medial lateral meniscus.  We then used extramedullary cutting guide for making her proximal tibia cut correction varus and valgus and a 3 degree slope.  We made this cut to take 2 mm off the low side we made the cut without difficulty but we backed down to more millimeters.  We found his bone to be nice hard and good quality bone.  We then went to the femur and used a intramedullary guide for distal femoral cut setting this for a right knee at 5 degrees external rotated and 10 mm distal femoral cut.  Once we have that We backed it down 2 more millimeters.  We go back to having the knee in full extension and with a 10 mm extension block and achieve full extension.  We then back to the femur but a femoral sizing guide based  off of the epicondylar axis.  Based off of this we chose a size 9 femur.  We put a 4-in-1 cutting block for a size 9 femur and made our anterior and posterior cuts followed our chamfer cuts.  We then backed the tibia and chose a size G tibial tray for coverage over the tibial plateau so the rotation of the tibial tubercle and the femur.  We did our keel punch and drill hole off of this and given the good  quality bone we did this for a press-fit implant.  We then trialed our size G right tibia followed by our size 9 right CR standard femur.  We trialed a 10 mm medial congruent right polythene insert and went to a 12 mm insert and I was pleased with the stability and range of motion of that 12 mm thickness insert.  We then made a patella cut and drilled a single hole for a press-fit size 38 patella button.  With all transportation the knee we put her through several cycles of motion and we are pleased with range of motion and stability.  All trials rotation was then removed and we irrigated the knee with normal saline using pulsatile Avage.  We dried the knee real well and placed our Marcaine with epinephrine local around the arthrotomy.  We then dried the knee roll over the knee to flex position placed our persona Biomet Zimmer press-fit tibial tray size G right, we placed our press-fit size 9 right CR standard femur.  We placed our 12 mm thickness right medial congruent polythene insert and press-fit our 38 patella button.  We then put the knee again through several cycles of motion were placed range of motion and stability.  The tourniquet was let down hemostasis was obtained with electrocautery.  The arthrotomy was then closed with #1 Vicryl suture followed by 0 Vicryl close deep tissue and 2-0 Vicryl close subcutaneous tissue.  The skin was closed with staples.  Well-padded sterile dressings applied.  The patient was taken recovery in stable tissue with all final counts being correct and no complications noted.  Benita Stabile PA-C did assisted in entire case and his assistance was crucial and medically necessary for soft tissue retraction and management, helping guide implant placement and a layered closure of the wound.

## 2022-06-01 ENCOUNTER — Telehealth: Payer: Self-pay | Admitting: Radiology

## 2022-06-01 LAB — BASIC METABOLIC PANEL
Anion gap: 7 (ref 5–15)
BUN: 17 mg/dL (ref 8–23)
CO2: 22 mmol/L (ref 22–32)
Calcium: 8.6 mg/dL — ABNORMAL LOW (ref 8.9–10.3)
Chloride: 107 mmol/L (ref 98–111)
Creatinine, Ser: 0.98 mg/dL (ref 0.61–1.24)
GFR, Estimated: >60 mL/min (ref >60)
Glucose, Bld: 130 mg/dL — ABNORMAL HIGH (ref 70–99)
Potassium: 3.8 mmol/L (ref 3.5–5.1)
Sodium: 136 mmol/L (ref 135–145)

## 2022-06-01 LAB — CBC
HCT: 33.2 % — ABNORMAL LOW (ref 39.0–52.0)
Hemoglobin: 11.5 g/dL — ABNORMAL LOW (ref 13.0–17.0)
MCH: 32.1 pg (ref 26.0–34.0)
MCHC: 34.6 g/dL (ref 30.0–36.0)
MCV: 92.7 fL (ref 80.0–100.0)
Platelets: 266 10*3/uL (ref 150–400)
RBC: 3.58 MIL/uL — ABNORMAL LOW (ref 4.22–5.81)
RDW: 12.4 % (ref 11.5–15.5)
WBC: 15.6 10*3/uL — ABNORMAL HIGH (ref 4.0–10.5)
nRBC: 0 % (ref 0.0–0.2)

## 2022-06-01 NOTE — Progress Notes (Signed)
Physical Therapy Treatment Patient Details Name: Bryan Hart MRN: [id number] DOB: 08/26/1950 Today's Date: 06/01/2022   History of Present Illness Pt is 71 yo male s/p R TKA on 05/31/22.  Pt with hx incluing OA, sleep apnea, CVA, HTN, Shoulder surgery, prior knee surgeries    PT Comments    Pt's wife present this session, reporting pt has some minor STM deficits at baseline but expresses concern that this has been exacerbated with surgery as pt is now demonstrating increased cognitive deficits. Pt repeatedly trying to get OOB to go to a "meeting" even though educated multiple times a few minutes before each attempt that there is no meeting and he is unsafe to get OOB on his own due to his symptomatic orthostatic hypotension, see below. Deferred gait and stair training due to his drop in BP and symptoms. Thus, focused session on bed mobility and transfer training along with education on positioning of leg and exercises. Will continue to follow acutely. Current recommendations remain appropriate.    BP:  143/88 supine 112/77 sitting 87/60 standing 117/84 sitting EOB towards end of session     Recommendations for follow up therapy are one component of a multi-disciplinary discharge planning process, led by the attending physician.  Recommendations may be updated based on patient status, additional functional criteria and insurance authorization.  Follow Up Recommendations  Follow physician's recommendations for discharge plan and follow up therapies     Assistance Recommended at Discharge Frequent or constant Supervision/Assistance  Patient can return home with the following A little help with bathing/dressing/bathroom;A little help with walking and/or transfers;Assistance with cooking/housework;Help with stairs or ramp for entrance;Assist for transportation;Direct supervision/assist for medications management;Direct supervision/assist for financial management   Equipment  Recommendations  BSC/3in1    Recommendations for Other Services       Precautions / Restrictions Precautions Precautions: Fall Precaution Comments: watch BP (orthostatic) Required Braces or Orthoses: Knee Immobilizer - Right Knee Immobilizer - Right: Discontinue once straight leg raise with < 10 degree lag (verbal order by Dr. Ninfa Linden to d/c KI 12/6) Restrictions Weight Bearing Restrictions: Yes RLE Weight Bearing: Weight bearing as tolerated     Mobility  Bed Mobility Overal bed mobility: Needs Assistance Bed Mobility: Supine to Sit, Sit to Supine     Supine to sit: Supervision, HOB elevated Sit to supine: Supervision, HOB elevated   General bed mobility comments: Increased time to manage R leg, but able to complete without assistance. HOB elevated, supervision for safety    Transfers Overall transfer level: Needs assistance Equipment used: Rolling walker (2 wheels) Transfers: Sit to/from Stand Sit to Stand: Min guard           General transfer comment: Cues for hand placement, good power up to stand with R foot anterior to L, min guard assist for safety, x2 reps    Ambulation/Gait               General Gait Details: deferred due to symptomatic orthostatic hypotension   Stairs             Wheelchair Mobility    Modified Rankin (Stroke Patients Only)       Balance Overall balance assessment: Needs assistance Sitting-balance support: No upper extremity supported Sitting balance-Leahy Scale: Good     Standing balance support: Bilateral upper extremity supported, During functional activity, No upper extremity supported Standing balance-Leahy Scale: Fair Standing balance comment: Able to stand statically without UE support, but would benefit from RW  Cognition Arousal/Alertness: Awake/alert Behavior During Therapy: WFL for tasks assessed/performed Overall Cognitive Status: Impaired/Different from  baseline Area of Impairment: Attention, Memory, Safety/judgement, Awareness                   Current Attention Level: Selective Memory: Decreased short-term memory, Decreased recall of precautions   Safety/Judgement: Decreased awareness of deficits, Decreased awareness of safety Awareness: Emergent   General Comments: Pt repeating he needed to get to a meeting even though MD met pt in room already, re-oriented pt multiple times. Pt repeatedly trying to get up OOB even though just told not to a few min before that and he agreed. Poor understanding of his deficits (BP dropping) that impact his safety. Wife reports he has some minor STM deficits at baseline, but this is not his normal.        Exercises Total Joint Exercises Ankle Circles/Pumps: AROM, Right, 5 reps, Supine Quad Sets: AROM, Right, 5 reps, Supine Heel Slides: AAROM, Right, 5 reps, Supine Straight Leg Raises: Right, AROM, 5 reps, Supine Long Arc Quad: AROM, Right, 10 reps, Seated Knee Flexion: AAROM, Right, Seated, 5 reps (cuing pt to use his other foot to provide AAROM)    General Comments General comments (skin integrity, edema, etc.): BP: 143/88 supine, 112/77 sitting, 87/60 standing, 117/84 sitting EOB towards end of session; educated pt and wife on positioning of leg in bed and on exercise handout      Pertinent Vitals/Pain Pain Assessment Pain Assessment: Faces Faces Pain Scale: Hurts little more Pain Location: R knee Pain Descriptors / Indicators: Discomfort, Grimacing, Operative site guarding Pain Intervention(s): Limited activity within patient's tolerance, Monitored during session, Repositioned    Home Living                          Prior Function            PT Goals (current goals can now be found in the care plan section) Acute Rehab PT Goals Patient Stated Goal: to improve PT Goal Formulation: With patient/family Time For Goal Achievement: 06/14/22 Potential to Achieve Goals:  Good Progress towards PT goals: Progressing toward goals    Frequency    7X/week      PT Plan Equipment recommendations need to be updated    Co-evaluation              AM-PAC PT "6 Clicks" Mobility   Outcome Measure  Help needed turning from your back to your side while in a flat bed without using bedrails?: A Little Help needed moving from lying on your back to sitting on the side of a flat bed without using bedrails?: A Little Help needed moving to and from a bed to a chair (including a wheelchair)?: A Little Help needed standing up from a chair using your arms (e.g., wheelchair or bedside chair)?: A Little Help needed to walk in hospital room?: A Lot Help needed climbing 3-5 steps with a railing? : Total 6 Click Score: 15    End of Session Equipment Utilized During Treatment: Gait belt Activity Tolerance: Other (comment) (limited by symptomatic orthostatic hypotension) Patient left: in bed;with call bell/phone within reach;with bed alarm set;with family/visitor present Nurse Communication: Mobility status;Other (comment) (BP) PT Visit Diagnosis: Other abnormalities of gait and mobility (R26.89);Muscle weakness (generalized) (M62.81);Unsteadiness on feet (R26.81);Difficulty in walking, not elsewhere classified (R26.2)     Time: 9833-8250 PT Time Calculation (min) (ACUTE ONLY): 30 min  Charges:  $Therapeutic Exercise:  8-22 mins $Therapeutic Activity: 8-22 mins                     Moishe Spice, PT, DPT Acute Rehabilitation Services  Office: [place] 06/01/2022, 8:52 AM

## 2022-06-01 NOTE — Discharge Instructions (Signed)

## 2022-06-01 NOTE — Progress Notes (Signed)
Physical Therapy Treatment Patient Details Name: Bryan Hart MRN: [id number] DOB: 1950-11-22 Today's Date: 06/01/2022   History of Present Illness Pt is 71 yo male s/p R TKA on 05/31/22.  Pt with hx incluing OA, sleep apnea, CVA, HTN, Shoulder surgery, prior knee surgeries    PT Comments    Pt received ambulating with wife, reporting some mild lightheadedness at the time. Pt was able to ambulate up to ~90 ft with a RW and min guard assist and navigate x2 stairs with 1 handrail and x1 HHA minA. Pt's wife educated on proper and safe guarding of pt with all standing mobility using gait belt provided. Pt's BP was able to be taken end of session (as pt was already ambulating upon arrival) with him still displaying orthostatic hypotension. Pt remains confused compared to baseline. Will continue to follow acutely. Current recommendations remain appropriate.  BP:  125/60s sitting EOB 94/61 standing    Recommendations for follow up therapy are one component of a multi-disciplinary discharge planning process, led by the attending physician.  Recommendations may be updated based on patient status, additional functional criteria and insurance authorization.  Follow Up Recommendations  Follow physician's recommendations for discharge plan and follow up therapies     Assistance Recommended at Discharge Frequent or constant Supervision/Assistance  Patient can return home with the following A little help with bathing/dressing/bathroom;A little help with walking and/or transfers;Assistance with cooking/housework;Help with stairs or ramp for entrance;Assist for transportation;Direct supervision/assist for medications management;Direct supervision/assist for financial management   Equipment Recommendations  BSC/3in1    Recommendations for Other Services       Precautions / Restrictions Precautions Precautions: Fall Precaution Comments: watch BP (orthostatic) Required Braces or Orthoses: Knee  Immobilizer - Right Knee Immobilizer - Right: Discontinue once straight leg raise with < 10 degree lag (verbal order by Dr. Ninfa Linden to d/c KI 12/6) Restrictions Weight Bearing Restrictions: Yes RLE Weight Bearing: Weight bearing as tolerated     Mobility  Bed Mobility Overal bed mobility: Needs Assistance Bed Mobility: Supine to Sit, Sit to Supine     Supine to sit: Supervision, HOB elevated Sit to supine: Supervision, HOB elevated   General bed mobility comments: Increased time to manage R leg, but able to complete without assistance. HOB elevated, supervision for safety    Transfers Overall transfer level: Needs assistance Equipment used: Rolling walker (2 wheels) Transfers: Sit to/from Stand Sit to Stand: Min guard           General transfer comment: Cues for hand placement, good power up to stand with R foot anterior to L, min guard assist for safety    Ambulation/Gait Ambulation/Gait assistance: Min guard Gait Distance (Feet): 90 Feet Assistive device: Rolling walker (2 wheels) Gait Pattern/deviations: Step-through pattern, Decreased stance time - right, Decreased stride length, Decreased step length - left, Antalgic Gait velocity: reduced Gait velocity interpretation: <1.31 ft/sec, indicative of household ambulator   General Gait Details: Slow, antalgic gait pattern due to R knee pain. No LOB, but pt needing repeated cues to keep RW proximal to him when approaching the bed to return to sit. Min guard for safety   Stairs Stairs: Yes Stairs assistance: Min assist Stair Management: One rail Left, One rail Right, Step to pattern, Forwards Number of Stairs: 2 General stair comments: Ascends with L rail and R HHA leading up with L leg and descends with R rail and L HHA leading down with R leg as cued. No LOB, minA for stability. Educated pt's  wife on safely guarding pt using gait belt provided   Wheelchair Mobility    Modified Rankin (Stroke Patients Only)        Balance Overall balance assessment: Needs assistance Sitting-balance support: No upper extremity supported Sitting balance-Leahy Scale: Good     Standing balance support: Bilateral upper extremity supported, During functional activity, No upper extremity supported Standing balance-Leahy Scale: Fair Standing balance comment: Able to stand statically without UE support, but would benefit from RW                            Cognition Arousal/Alertness: Awake/alert Behavior During Therapy: WFL for tasks assessed/performed Overall Cognitive Status: Impaired/Different from baseline Area of Impairment: Attention, Memory, Safety/judgement, Awareness, Problem solving                   Current Attention Level: Selective Memory: Decreased short-term memory, Decreased recall of precautions   Safety/Judgement: Decreased awareness of deficits, Decreased awareness of safety Awareness: Emergent Problem Solving: Slow processing, Requires verbal cues General Comments: Pt continues to remain confused with noted STM deficits, often needing repetition of cues. Poor safety awareness and recall to keep RW with him        Exercises Total Joint Exercises Ankle Circles/Pumps: AROM, Right, 5 reps, Supine Quad Sets: AROM, Right, 5 reps, Supine Heel Slides: AAROM, Right, 5 reps, Supine Straight Leg Raises: Right, AROM, 5 reps, Supine Long Arc Quad: AROM, Right, 10 reps, Seated Knee Flexion: AAROM, Right, Seated, 5 reps (cuing pt to use his other foot to provide AAROM)    General Comments General comments (skin integrity, edema, etc.): BP: 125/60s sitting EOB, 94/61 standing; discussed having KI on if pt gets impulsive and walks by himself but recs are for guarding pt with all standing mobility, preferrably with KI discharged if safe      Pertinent Vitals/Pain Pain Assessment Pain Assessment: Faces Faces Pain Scale: Hurts little more Pain Location: R knee Pain Descriptors /  Indicators: Discomfort, Grimacing, Operative site guarding Pain Intervention(s): Limited activity within patient's tolerance, Monitored during session, Repositioned    Home Living                          Prior Function            PT Goals (current goals can now be found in the care plan section) Acute Rehab PT Goals Patient Stated Goal: to improve PT Goal Formulation: With patient/family Time For Goal Achievement: 06/14/22 Potential to Achieve Goals: Good Progress towards PT goals: Progressing toward goals    Frequency    7X/week      PT Plan Current plan remains appropriate    Co-evaluation              AM-PAC PT "6 Clicks" Mobility   Outcome Measure  Help needed turning from your back to your side while in a flat bed without using bedrails?: A Little Help needed moving from lying on your back to sitting on the side of a flat bed without using bedrails?: A Little Help needed moving to and from a bed to a chair (including a wheelchair)?: A Little Help needed standing up from a chair using your arms (e.g., wheelchair or bedside chair)?: A Little Help needed to walk in hospital room?: A Little Help needed climbing 3-5 steps with a railing? : A Little 6 Click Score: 18    End of Session Equipment  Utilized During Treatment: Gait belt Activity Tolerance: Patient tolerated treatment well Patient left: in bed;with call bell/phone within reach;with bed alarm set;with family/visitor present Nurse Communication: Mobility status;Other (comment) (BP) PT Visit Diagnosis: Other abnormalities of gait and mobility (R26.89);Muscle weakness (generalized) (M62.81);Unsteadiness on feet (R26.81);Difficulty in walking, not elsewhere classified (R26.2)     Time: 7841-2820 PT Time Calculation (min) (ACUTE ONLY): 26 min  Charges:  $Gait Training: 8-22 mins $Therapeutic Activity: 8-22 mins                     Moishe Spice, PT, DPT Acute Rehabilitation  Services  Office: [phone]    Orvan Falconer 06/01/2022, 4:11 PM

## 2022-06-01 NOTE — Telephone Encounter (Signed)
Received call from [facility] with utilization review recommending that patient be changed to inpatient status. CB for [phone]

## 2022-06-02 ENCOUNTER — Encounter (HOSPITAL_COMMUNITY): Payer: Self-pay | Admitting: Orthopaedic Surgery

## 2022-06-02 DIAGNOSIS — I10 Essential (primary) hypertension: Secondary | ICD-10-CM | POA: Diagnosis present

## 2022-06-02 DIAGNOSIS — Z8249 Family history of ischemic heart disease and other diseases of the circulatory system: Secondary | ICD-10-CM | POA: Diagnosis not present

## 2022-06-02 DIAGNOSIS — Z87891 Personal history of nicotine dependence: Secondary | ICD-10-CM | POA: Diagnosis not present

## 2022-06-02 DIAGNOSIS — E785 Hyperlipidemia, unspecified: Secondary | ICD-10-CM | POA: Diagnosis present

## 2022-06-02 DIAGNOSIS — M1711 Unilateral primary osteoarthritis, right knee: Secondary | ICD-10-CM | POA: Diagnosis present

## 2022-06-02 DIAGNOSIS — Z823 Family history of stroke: Secondary | ICD-10-CM | POA: Diagnosis not present

## 2022-06-02 DIAGNOSIS — G4731 Primary central sleep apnea: Secondary | ICD-10-CM | POA: Diagnosis present

## 2022-06-02 DIAGNOSIS — Z96611 Presence of right artificial shoulder joint: Secondary | ICD-10-CM | POA: Diagnosis present

## 2022-06-02 DIAGNOSIS — Z8673 Personal history of transient ischemic attack (TIA), and cerebral infarction without residual deficits: Secondary | ICD-10-CM | POA: Diagnosis not present

## 2022-06-02 DIAGNOSIS — Z91048 Other nonmedicinal substance allergy status: Secondary | ICD-10-CM | POA: Diagnosis not present

## 2022-06-02 MED ORDER — METHOCARBAMOL 500 MG PO TABS: 500.0000 mg | ORAL_TABLET | Freq: Four times a day (QID) | ORAL | 1 refills | Status: DC | PRN

## 2022-06-02 MED ORDER — ASPIRIN 81 MG PO CHEW: 81.0000 mg | CHEWABLE_TABLET | Freq: Two times a day (BID) | ORAL | 0 refills | Status: DC

## 2022-06-02 MED ORDER — OXYCODONE HCL 5 MG PO TABS: 5.0000 mg | ORAL_TABLET | Freq: Four times a day (QID) | ORAL | 0 refills | Status: DC | PRN

## 2022-06-02 NOTE — Progress Notes (Signed)
Physical Therapy Treatment Patient Details Name: Bryan Hart MRN: [id number] DOB: 03/29/1951 Today's Date: 06/02/2022   History of Present Illness Pt is 71 yo male s/p R TKA on 05/31/22.  Pt with hx incluing OA, sleep apnea, CVA, HTN, Shoulder surgery, prior knee surgeries    PT Comments    Pt is reporting just mild lightheadedness throughout the session today, but continues to demonstrate a drop in BP with standing mobility. However, the BP numbers appear to improve with movement. Educated pt and wife on monitoring his BP at home and ways to try to stabilize his BP. They verbalized understanding. Pt able to ambulate an increased distance of up to ~145 ft with a RW at a min guard assist level, but displays increased instability and gait deviations at distances increase. Wife reports plan to change where pt is sleeping so she can monitor him more closely initially at home considering his hypotension and continued, but improved confusion. Will continue to follow acutely. Current recommendations remain appropriate.    BP:  134/81 supine 125/78 sitting 76/56 standing 115/100 standing while ambulating 90/61 standing statically after ambulating  *2/10 lightheadedness reported consistently with standing mobility    Recommendations for follow up therapy are one component of a multi-disciplinary discharge planning process, led by the attending physician.  Recommendations may be updated based on patient status, additional functional criteria and insurance authorization.  Follow Up Recommendations  Follow physician's recommendations for discharge plan and follow up therapies     Assistance Recommended at Discharge Frequent or constant Supervision/Assistance  Patient can return home with the following A little help with bathing/dressing/bathroom;A little help with walking and/or transfers;Assistance with cooking/housework;Help with stairs or ramp for entrance;Assist for transportation;Direct  supervision/assist for medications management;Direct supervision/assist for financial management   Equipment Recommendations  BSC/3in1    Recommendations for Other Services       Precautions / Restrictions Precautions Precautions: Fall Precaution Comments: watch BP (orthostatic) Required Braces or Orthoses: Knee Immobilizer - Right Knee Immobilizer - Right: Discontinue once straight leg raise with < 10 degree lag (verbal order by Dr. Ninfa Linden to d/c KI 12/6) Restrictions Weight Bearing Restrictions: Yes RLE Weight Bearing: Weight bearing as tolerated     Mobility  Bed Mobility Overal bed mobility: Needs Assistance Bed Mobility: Supine to Sit     Supine to sit: Supervision, HOB elevated     General bed mobility comments: Increased time to manage R leg, but able to complete without assistance. HOB elevated, supervision for safety    Transfers Overall transfer level: Needs assistance Equipment used: Rolling walker (2 wheels) Transfers: Sit to/from Stand Sit to Stand: Min guard           General transfer comment: Good power up to stand with R foot anterior to L, min guard assist for safety    Ambulation/Gait Ambulation/Gait assistance: Min guard Gait Distance (Feet): 145 Feet Assistive device: Rolling walker (2 wheels) Gait Pattern/deviations: Step-through pattern, Decreased stance time - right, Decreased stride length, Decreased step length - left, Antalgic, Narrow base of support, Knee flexed in stance - right Gait velocity: reduced Gait velocity interpretation: <1.31 ft/sec, indicative of household ambulator   General Gait Details: Slow, antalgic gait pattern due to R knee pain, demonstrating R knee flexion but no buckling during R stance. No LOB, but pt needing repeated cues to widen BOS and improved bil feet clearance, momentary success noted. 2/10 lightheadedness consistently with standing mobility reported   Stairs  Wheelchair Mobility     Modified Rankin (Stroke Patients Only)       Balance Overall balance assessment: Needs assistance Sitting-balance support: No upper extremity supported Sitting balance-Leahy Scale: Good     Standing balance support: Bilateral upper extremity supported, During functional activity, No upper extremity supported Standing balance-Leahy Scale: Fair Standing balance comment: Able to stand statically without UE support, but would benefit from RW                            Cognition Arousal/Alertness: Awake/alert Behavior During Therapy: WFL for tasks assessed/performed Overall Cognitive Status: Impaired/Different from baseline Area of Impairment: Attention, Memory, Safety/judgement, Awareness, Problem solving, Orientation                 Orientation Level: Disoriented to, Time, Situation Current Attention Level: Selective Memory: Decreased short-term memory, Decreased recall of precautions   Safety/Judgement: Decreased awareness of deficits, Decreased awareness of safety Awareness: Emergent Problem Solving: Slow processing, Requires verbal cues General Comments: Pt with increasded processing speed compared to yesterday and less STM deficits noted as pt would not repeatedly get up when cued to remain seated instead. Pt disoriented to situation and taking a few minutes to recall the month and year. Pt tries to hide deficits with jokes. Poor insight into his safety        Exercises Total Joint Exercises Heel Slides: AROM, Right, 20 reps, Seated Long Arc Quad: Right, 20 reps, Seated, AROM    General Comments General comments (skin integrity, edema, etc.): BP: 134/81 supine, 125/78 sitting, 76/56 standing, 115/100 standing while ambulating, 90/61 standing statically after ambulating; 2/10 lightheadedness reported consistently with standing mobility; wrote up handout to pt and his wife in regards to ways to monitor and try to improve his BP      Pertinent Vitals/Pain  Pain Assessment Pain Assessment: Faces Faces Pain Scale: Hurts little more Pain Location: R knee Pain Descriptors / Indicators: Discomfort, Grimacing, Operative site guarding, Other (Comment) ("stiff") Pain Intervention(s): Limited activity within patient's tolerance, Monitored during session, Repositioned    Home Living                          Prior Function            PT Goals (current goals can now be found in the care plan section) Acute Rehab PT Goals Patient Stated Goal: to improve PT Goal Formulation: With patient/family Time For Goal Achievement: 06/14/22 Potential to Achieve Goals: Good Progress towards PT goals: Progressing toward goals    Frequency    7X/week      PT Plan Current plan remains appropriate    Co-evaluation              AM-PAC PT "6 Clicks" Mobility   Outcome Measure  Help needed turning from your back to your side while in a flat bed without using bedrails?: A Little Help needed moving from lying on your back to sitting on the side of a flat bed without using bedrails?: A Little Help needed moving to and from a bed to a chair (including a wheelchair)?: A Little Help needed standing up from a chair using your arms (e.g., wheelchair or bedside chair)?: A Little Help needed to walk in hospital room?: A Little Help needed climbing 3-5 steps with a railing? : A Little 6 Click Score: 18    End of Session Equipment Utilized During Treatment: Gait  belt Activity Tolerance: Patient tolerated treatment well Patient left: in bed;with call bell/phone within reach;with family/visitor present Nurse Communication: Mobility status PT Visit Diagnosis: Other abnormalities of gait and mobility (R26.89);Muscle weakness (generalized) (M62.81);Unsteadiness on feet (R26.81);Difficulty in walking, not elsewhere classified (R26.2)     Time: 0258-5277 PT Time Calculation (min) (ACUTE ONLY): 34 min  Charges:  $Gait Training: 8-22  mins $Therapeutic Exercise: 8-22 mins                     Moishe Spice, PT, DPT Acute Rehabilitation Services  Office: [phone]    Orvan Falconer 06/02/2022, 9:27 AM

## 2022-06-02 NOTE — Progress Notes (Signed)
PT Cancellation Note  Patient Details Name: Bryan Hart MRN: [id number] DOB: [birth year]   Cancelled Treatment:    Reason Eval/Treat Not Completed: Other (comment). Returned for second session for today, but pt getting ready to d/c home at this time. Pt and his wife reported no further questions or concerns for PT at this time. If pt ends up staying, will plan to follow-up another time.   Moishe Spice, PT, DPT Acute Rehabilitation Services  Office: [place] 06/02/2022, 11:20 AM

## 2022-06-02 NOTE — Plan of Care (Signed)
  Problem: Education: Goal: Knowledge of the prescribed therapeutic regimen will improve Outcome: Completed/Met Goal: Individualized Educational Video(s) Outcome: Completed/Met  Patient alert and oriented x 2, void, ambulate,VSS, surgical site  clean and dry no sign of infection. D/c instructions explain to the  patient wife all questions answered. Patient d/c home per order

## 2022-06-02 NOTE — Discharge Summary (Signed)
Patient ID: Bryan Hart MRN: [id number] DOB/AGE: [birth year] 73 y.o.  Admit date: 05/31/2022 Discharge date: 06/02/2022  Admission Diagnoses:  Principal Problem:   Unilateral primary osteoarthritis, right knee Active Problems:   Status post total right knee replacement   Discharge Diagnoses:  Same  Past Medical History:  Diagnosis Date   Arthritis    HLD (hyperlipidemia)    Hypertension    dr  ed green      stress test   12 yrs ago   PVC's (premature ventricular contractions) 12 years ago   stress test done   Sleep apnea    Stroke [facility])    minor stroke 01/2018    Surgeries: Procedure(s): RIGHT TOTAL KNEE ARTHROPLASTY on 05/31/2022   Consultants:   Discharged Condition: Improved  Hospital Course: Bryan Hart is an 71 y.o. male who was admitted 05/31/2022 for operative treatment ofUnilateral primary osteoarthritis, right knee. Patient has severe unremitting pain that affects sleep, daily activities, and work/hobbies. After pre-op clearance the patient was taken to the operating room on 05/31/2022 and underwent  Procedure(s): RIGHT TOTAL KNEE ARTHROPLASTY.    Patient was given perioperative antibiotics:  Anti-infectives (From admission, onward)    Start     Dose/Rate Route Frequency Ordered Stop   05/31/22 1130  ceFAZolin (ANCEF) IVPB 1 g/50 mL premix        1 g 100 mL/hr over 30 Minutes Intravenous Every 6 hours 05/31/22 1120 05/31/22 2329   05/31/22 0600  ceFAZolin (ANCEF) IVPB 2g/100 mL premix        2 g 200 mL/hr over 30 Minutes Intravenous On call to O.R. 05/31/22 [id number] 05/31/22 0750        Patient was given sequential compression devices, early ambulation, and chemoprophylaxis to prevent DVT.  Patient benefited maximally from hospital stay and there were no complications.    Recent vital signs: Patient Vitals for the past 24 hrs:  BP Temp Temp src Pulse Resp SpO2  06/02/22 0820 123/77 98.3 F (36.8 C) Oral 85 17 98 %  06/02/22 0404 (!) 150/86 98.5 F  (36.9 C) Oral 89 18 98 %  06/02/22 0011 (!) 141/89 98.4 F (36.9 C) Oral 83 17 98 %  06/01/22 2020 (!) 151/87 98.2 F (36.8 C) Oral 80 18 98 %  06/01/22 1820 131/83 98.3 F (36.8 C) Oral 79 16 99 %     Recent laboratory studies:  Recent Labs    06/01/22 0559  WBC 15.6*  HGB 11.5*  HCT 33.2*  PLT 266  NA 136  K 3.8  CL 107  CO2 22  BUN 17  CREATININE 0.98  GLUCOSE 130*  CALCIUM 8.6*     Discharge Medications:   Allergies as of 06/02/2022       Reactions   Adhesive [tape] Rash        Medication List     STOP taking these medications    aspirin EC 81 MG tablet Replaced by: aspirin 81 MG chewable tablet   ibuprofen 200 MG tablet Commonly known as: ADVIL       TAKE these medications    aspirin 81 MG chewable tablet Chew 1 tablet (81 mg total) by mouth 2 (two) times daily. Replaces: aspirin EC 81 MG tablet   atorvastatin 20 MG tablet Commonly known as: LIPITOR Take 20 mg by mouth daily at 6 PM.   methocarbamol 500 MG tablet Commonly known as: ROBAXIN Take 1 tablet (500 mg total) by mouth every 6 (six) hours as needed  for muscle spasms.   oxyCODONE 5 MG immediate release tablet Commonly known as: Oxy IR/ROXICODONE Take 1-2 tablets (5-10 mg total) by mouth every 6 (six) hours as needed for moderate pain (pain score 4-6).   verapamil 240 MG CR tablet Commonly known as: CALAN-SR Take 240 mg by mouth daily.               Durable Medical Equipment  (From admission, onward)           Start     Ordered   05/31/22 1121  DME 3 n 1  Once        05/31/22 1120   05/31/22 1121  DME Walker rolling  Once       Question Answer Comment  Walker: With 5 Inch Wheels   Patient needs a walker to treat with the following condition Status post total right knee replacement      05/31/22 1120            Diagnostic Studies: DG Knee Right Port  Result Date: 05/31/2022 CLINICAL DATA:  Post right total knee replacement. EXAM: PORTABLE RIGHT KNEE -  1-2 VIEW COMPARISON:  03/28/2022 FINDINGS: Evidence of patient's recent right total knee replacement with prosthetic components intact and normally located. Mild air and fluid within the knee joint. Skin staples over the anterior soft tissues vertically just left of midline. IMPRESSION: Expected postsurgical changes post right total knee replacement. Electronically Signed   By: Marin Olp M.D.   On: 05/31/2022 10:48    Disposition: Discharge disposition: 06-Home-Health Care Svc          Follow-up Information     [facility] Follow up.   Specialty: [place] Why: The home health agency will contact you for the first home visit Contact information: [address] [place] [address] [phone]         Mcarthur Rossetti, MD Follow up in 2 week(s).   Specialty: Orthopedic Surgery Contact information: [address] [place] [address] [phone]                  Signed: Erskine Emery 06/02/2022, 9:50 AM

## 2022-06-02 NOTE — Progress Notes (Signed)
Subjective: 2 Days Post-Op Procedure(s) (LRB): RIGHT TOTAL KNEE ARTHROPLASTY (Right) Patient reports pain as moderate.  Awake and alert. Progressing well with PT.   Objective: Vital signs in last 24 hours: Temp:  [98.2 F (36.8 C)-98.5 F (36.9 C)] 98.3 F (36.8 C) (12/07 0820) Pulse Rate:  [79-89] 85 (12/07 0820) Resp:  [16-18] 17 (12/07 0820) BP: (123-151)/(77-89) 123/77 (12/07 0820) SpO2:  [98 %-99 %] 98 % (12/07 0820)  Intake/Output from previous day: No intake/output data recorded. Intake/Output this shift: No intake/output data recorded.  Recent Labs    06/01/22 0559  HGB 11.5*   Recent Labs    06/01/22 0559  WBC 15.6*  RBC 3.58*  HCT 33.2*  PLT 266   Recent Labs    06/01/22 0559  NA 136  K 3.8  CL 107  CO2 22  BUN 17  CREATININE 0.98  GLUCOSE 130*  CALCIUM 8.6*   No results for input(s): "LABPT", "INR" in the last 72 hours.  Incision: scant drainage Compartment soft   Assessment/Plan: 2 Days Post-Op Procedure(s) (LRB): RIGHT TOTAL KNEE ARTHROPLASTY (Right) Discharge home with home health     Bryan Hart 06/02/2022, 9:46 AM

## 2022-06-03 ENCOUNTER — Other Ambulatory Visit: Payer: Self-pay | Admitting: *Deleted

## 2022-06-03 DIAGNOSIS — G4731 Primary central sleep apnea: Secondary | ICD-10-CM | POA: Diagnosis not present

## 2022-06-03 DIAGNOSIS — Z96611 Presence of right artificial shoulder joint: Secondary | ICD-10-CM | POA: Diagnosis not present

## 2022-06-03 DIAGNOSIS — Z7982 Long term (current) use of aspirin: Secondary | ICD-10-CM | POA: Diagnosis not present

## 2022-06-03 DIAGNOSIS — Z96651 Presence of right artificial knee joint: Secondary | ICD-10-CM | POA: Diagnosis not present

## 2022-06-03 DIAGNOSIS — Z9181 History of falling: Secondary | ICD-10-CM | POA: Diagnosis not present

## 2022-06-03 DIAGNOSIS — M1712 Unilateral primary osteoarthritis, left knee: Secondary | ICD-10-CM | POA: Diagnosis not present

## 2022-06-03 DIAGNOSIS — Z87891 Personal history of nicotine dependence: Secondary | ICD-10-CM | POA: Diagnosis not present

## 2022-06-03 DIAGNOSIS — K5900 Constipation, unspecified: Secondary | ICD-10-CM | POA: Diagnosis not present

## 2022-06-03 DIAGNOSIS — I4430 Unspecified atrioventricular block: Secondary | ICD-10-CM | POA: Diagnosis not present

## 2022-06-03 DIAGNOSIS — I493 Ventricular premature depolarization: Secondary | ICD-10-CM | POA: Diagnosis not present

## 2022-06-03 DIAGNOSIS — Z8673 Personal history of transient ischemic attack (TIA), and cerebral infarction without residual deficits: Secondary | ICD-10-CM | POA: Diagnosis not present

## 2022-06-03 DIAGNOSIS — I10 Essential (primary) hypertension: Secondary | ICD-10-CM | POA: Diagnosis not present

## 2022-06-03 DIAGNOSIS — E785 Hyperlipidemia, unspecified: Secondary | ICD-10-CM | POA: Diagnosis not present

## 2022-06-03 DIAGNOSIS — Z471 Aftercare following joint replacement surgery: Secondary | ICD-10-CM | POA: Diagnosis not present

## 2022-06-03 DIAGNOSIS — M1711 Unilateral primary osteoarthritis, right knee: Secondary | ICD-10-CM

## 2022-06-03 NOTE — Progress Notes (Signed)
Ortho bundle D/C call completed. 

## 2022-06-07 DIAGNOSIS — Z9181 History of falling: Secondary | ICD-10-CM | POA: Diagnosis not present

## 2022-06-07 DIAGNOSIS — E785 Hyperlipidemia, unspecified: Secondary | ICD-10-CM | POA: Diagnosis not present

## 2022-06-07 DIAGNOSIS — M1712 Unilateral primary osteoarthritis, left knee: Secondary | ICD-10-CM | POA: Diagnosis not present

## 2022-06-07 DIAGNOSIS — Z87891 Personal history of nicotine dependence: Secondary | ICD-10-CM | POA: Diagnosis not present

## 2022-06-07 DIAGNOSIS — Z96651 Presence of right artificial knee joint: Secondary | ICD-10-CM | POA: Diagnosis not present

## 2022-06-07 DIAGNOSIS — K5900 Constipation, unspecified: Secondary | ICD-10-CM | POA: Diagnosis not present

## 2022-06-07 DIAGNOSIS — G4731 Primary central sleep apnea: Secondary | ICD-10-CM | POA: Diagnosis not present

## 2022-06-07 DIAGNOSIS — Z471 Aftercare following joint replacement surgery: Secondary | ICD-10-CM | POA: Diagnosis not present

## 2022-06-07 DIAGNOSIS — I10 Essential (primary) hypertension: Secondary | ICD-10-CM | POA: Diagnosis not present

## 2022-06-07 DIAGNOSIS — Z8673 Personal history of transient ischemic attack (TIA), and cerebral infarction without residual deficits: Secondary | ICD-10-CM | POA: Diagnosis not present

## 2022-06-07 DIAGNOSIS — I493 Ventricular premature depolarization: Secondary | ICD-10-CM | POA: Diagnosis not present

## 2022-06-07 DIAGNOSIS — Z96611 Presence of right artificial shoulder joint: Secondary | ICD-10-CM | POA: Diagnosis not present

## 2022-06-07 DIAGNOSIS — Z7982 Long term (current) use of aspirin: Secondary | ICD-10-CM | POA: Diagnosis not present

## 2022-06-07 DIAGNOSIS — I4430 Unspecified atrioventricular block: Secondary | ICD-10-CM | POA: Diagnosis not present

## 2022-06-10 ENCOUNTER — Telehealth: Payer: Self-pay | Admitting: *Deleted

## 2022-06-10 NOTE — Telephone Encounter (Signed)
Attempted call to patient; requested call back.

## 2022-06-13 DIAGNOSIS — Z9181 History of falling: Secondary | ICD-10-CM | POA: Diagnosis not present

## 2022-06-13 DIAGNOSIS — Z96611 Presence of right artificial shoulder joint: Secondary | ICD-10-CM | POA: Diagnosis not present

## 2022-06-13 DIAGNOSIS — Z8673 Personal history of transient ischemic attack (TIA), and cerebral infarction without residual deficits: Secondary | ICD-10-CM | POA: Diagnosis not present

## 2022-06-13 DIAGNOSIS — Z7982 Long term (current) use of aspirin: Secondary | ICD-10-CM | POA: Diagnosis not present

## 2022-06-13 DIAGNOSIS — M1712 Unilateral primary osteoarthritis, left knee: Secondary | ICD-10-CM | POA: Diagnosis not present

## 2022-06-13 DIAGNOSIS — I4430 Unspecified atrioventricular block: Secondary | ICD-10-CM | POA: Diagnosis not present

## 2022-06-13 DIAGNOSIS — Z87891 Personal history of nicotine dependence: Secondary | ICD-10-CM | POA: Diagnosis not present

## 2022-06-13 DIAGNOSIS — Z471 Aftercare following joint replacement surgery: Secondary | ICD-10-CM | POA: Diagnosis not present

## 2022-06-13 DIAGNOSIS — Z96651 Presence of right artificial knee joint: Secondary | ICD-10-CM | POA: Diagnosis not present

## 2022-06-13 DIAGNOSIS — I10 Essential (primary) hypertension: Secondary | ICD-10-CM | POA: Diagnosis not present

## 2022-06-13 DIAGNOSIS — E785 Hyperlipidemia, unspecified: Secondary | ICD-10-CM | POA: Diagnosis not present

## 2022-06-13 DIAGNOSIS — K5900 Constipation, unspecified: Secondary | ICD-10-CM | POA: Diagnosis not present

## 2022-06-13 DIAGNOSIS — G4731 Primary central sleep apnea: Secondary | ICD-10-CM | POA: Diagnosis not present

## 2022-06-13 DIAGNOSIS — I493 Ventricular premature depolarization: Secondary | ICD-10-CM | POA: Diagnosis not present

## 2022-06-14 ENCOUNTER — Encounter: Payer: Self-pay | Admitting: Physician Assistant

## 2022-06-14 ENCOUNTER — Ambulatory Visit: Payer: PPO | Admitting: Physical Therapy

## 2022-06-14 ENCOUNTER — Encounter: Payer: Self-pay | Admitting: Physical Therapy

## 2022-06-14 ENCOUNTER — Telehealth: Payer: Self-pay | Admitting: *Deleted

## 2022-06-14 ENCOUNTER — Ambulatory Visit (INDEPENDENT_AMBULATORY_CARE_PROVIDER_SITE_OTHER): Payer: PPO | Admitting: Physician Assistant

## 2022-06-14 ENCOUNTER — Other Ambulatory Visit: Payer: Self-pay

## 2022-06-14 DIAGNOSIS — M25661 Stiffness of right knee, not elsewhere classified: Secondary | ICD-10-CM | POA: Diagnosis not present

## 2022-06-14 DIAGNOSIS — M6281 Muscle weakness (generalized): Secondary | ICD-10-CM | POA: Diagnosis not present

## 2022-06-14 DIAGNOSIS — Z96651 Presence of right artificial knee joint: Secondary | ICD-10-CM

## 2022-06-14 DIAGNOSIS — R600 Localized edema: Secondary | ICD-10-CM

## 2022-06-14 DIAGNOSIS — M25561 Pain in right knee: Secondary | ICD-10-CM

## 2022-06-14 DIAGNOSIS — R2689 Other abnormalities of gait and mobility: Secondary | ICD-10-CM | POA: Diagnosis not present

## 2022-06-14 DIAGNOSIS — R2681 Unsteadiness on feet: Secondary | ICD-10-CM

## 2022-06-14 NOTE — Progress Notes (Signed)
HPI: Bryan Hart comes in today status post right total knee arthroplasty.  He is ambulating with a walker.  He is on aspirin 81 mg twice daily for DVT prophylaxis.  He is on an aspirin once daily prior to surgery.  He states his range of motion and strength are improving.  He notes that his right knee pain is 6 out of 10 pain at worst.  He does note initially he had some problems recalling things and names after surgery but this seems to resolved.  Denies any fevers chills.  He is set up for outpatient therapy upstairs at our office either today.  He is currently taking no pain medication.  Review of systems: See HPI  Physical exam: General well-developed well-nourished male no acute distress mood affect appropriate.  Ambulates with a rolling walker. Psych: Alert and oriented x 3 Right knee has full extension flexes to approximately 90 degrees.  Calf supple nontender dorsiflexion plantarflexion ankle intact.  Surgical incisions well-approximated staples no signs of infection wound healing problems.  Impression: Status post right total knee arthroplasty  Plan: He will work with outpatient therapy to work on range of motion and strengthening.  Staples were harvested today Steri-Strips applied.  Scar tissue mobilization encouraged.  Follow-up with [place] in 1 month sooner if there is any questions or concerns.

## 2022-06-14 NOTE — Therapy (Signed)
OUTPATIENT PHYSICAL THERAPY LOWER EXTREMITY EVALUATION   Patient Name: Bryan Hart MRN: [id number] DOB:[birth year], 71 y.o., male Today's Date: 06/14/2022  END OF SESSION:  PT End of Session - 06/14/22 1200     Visit Number 1    Number of Visits 25    Date for PT Re-Evaluation 08/26/22    Authorization Type [employer]    Authorization Time Period $25 co-pay    Progress Note Due on Visit 10    PT Start Time 1100    PT Stop Time 1158    PT Time Calculation (min) 58 min    Activity Tolerance Patient tolerated treatment well;Patient limited by pain    Behavior During Therapy [facility] for tasks assessed/performed             Past Medical History:  Diagnosis Date   Arthritis    HLD (hyperlipidemia)    Hypertension    dr  ed green      stress test   12 yrs ago   PVC's (premature ventricular contractions) 12 years ago   stress test done   Sleep apnea    Stroke [facility])    minor stroke 01/2018   Past Surgical History:  Procedure Laterality Date   ANKLE ARTHROSCOPY  06/27/2008   JOINT REPLACEMENT  06/27/2009   rt shoulder rotator cuff repair   knee surgeries  [id number]   x2   SHOULDER HEMI-ARTHROPLASTY  01/12/2012   Procedure: SHOULDER HEMI-ARTHROPLASTY;  Surgeon: Nita Sells, MD;  Location: [place];  Service: Orthopedics;  Laterality: Right;   SHOULDER SURGERY Right 07/2021   TOTAL KNEE ARTHROPLASTY Right 05/31/2022   Procedure: RIGHT TOTAL KNEE ARTHROPLASTY;  Surgeon: Mcarthur Rossetti, MD;  Location: [place];  Service: Orthopedics;  Laterality: Right;   Patient Active Problem List   Diagnosis Date Noted   Status post total right knee replacement 05/31/2022   Unilateral primary osteoarthritis, left knee 03/28/2022   Unilateral primary osteoarthritis, right knee 03/28/2022   Primary osteoarthritis of left knee 04/30/2020   Effusion, right knee 03/31/2020   Degenerative arthritis of right knee 03/31/2020   Bilateral primary osteoarthritis of knee 12/27/2018    AV block 07/29/2018   Central sleep apnea 07/29/2018   Cerebrovascular accident [facility]) 07/29/2018   Primary localized osteoarthrosis, shoulder region 01/13/2012    PCP: Sueanne Margarita, DO  REFERRING PROVIDER: Jean Rosenthal, MD  REFERRING DIAG:  M17.11 (ICD-10-CM) - Unilateral primary osteoarthritis, right knee  Z96.651 (ICD-10-CM) - Status post total right knee replacement    THERAPY DIAG:  Acute pain of right knee  Muscle weakness (generalized)  Stiffness of right knee, not elsewhere classified  Localized edema  Other abnormalities of gait and mobility  Unsteadiness on feet  Rationale for Evaluation and Treatment: Rehabilitation  ONSET DATE:  05/31/2022 Right TKA  SUBJECTIVE:   SUBJECTIVE STATEMENT: This 71yo underwent right Total Knee Arthroplasty on 05/31/2022 due to OA.  He also needs a left TKA at some time in future. He had HHPT until 06/13/2022.    PERTINENT HISTORY: OA, Right shoulder hemiarthroplasty, AV block, CVA 01/2018, HTN  PAIN:  Are you having pain? Yes: NPRS scale: today 1/10 and in last week 1/10 - 7/10 Pain location: right knee anterior and lateral Pain description: sore  Aggravating factors: twisting / turning Relieving factors: sit down, massage, meds, ice  PRECAUTIONS: None  WEIGHT BEARING RESTRICTIONS: No  FALLS:  Has patient fallen in last 6 months? No    LIVING ENVIRONMENT: Lives with: lives with their  spouse Lives in: House 2-story half bath downstairs, bedrooms & bathrooms are upstairs. Stairs: Yes: Internal: 13 steps; on right going up and External: 1 steps; none Has following equipment at home: Single point cane, Walker - 2 wheeled, shower chair, and Grab bars  OCCUPATION: retired   PLOF: Independent, [place] with household mobility without device, and Independent with community mobility without device  PATIENT GOALS:   function without pain, improve walking, yard work  NEXT MD VISIT: 07/13/2022  OBJECTIVE:    DIAGNOSTIC FINDINGS: 05/31/2022 post-op X-ray Evidence of patient's recent right total knee replacement with prosthetic components intact and normally located. Mild air and fluid within the knee joint. Skin staples over the anterior soft tissues vertically just left of midline.  PATIENT SURVEYS:  FOTO intake: 47%   predicted:  55%  COGNITION: Overall cognitive status: WFL    SENSATION: WFL  EDEMA:  RLE: above knee 49.8cm  around knee 48.9cm below knee  42.8cm LLE:  above knee 41.8cm  around knee 44cm  below knee  35cm  POSTURE: rounded shoulders, forward head, flexed trunk , and weight shift left  PALPATION: Tenderness along incision, patella borders, joint line, distal quads and proximal gastroc.   LOWER EXTREMITY ROM:   ROM Right eval  Hip flexion   Hip extension   Hip abduction   Hip adduction   Hip internal rotation   Hip external rotation   Knee flexion Seated A: 83* P: 86*  Knee extension Supine: P: -11* A: quad set -14* Seated A: LAQ -34*  Ankle dorsiflexion   Ankle plantarflexion   Ankle inversion   Ankle eversion    (Blank rows = not tested)  LOWER EXTREMITY MMT:  MMT Right eval Left eval  Hip flexion    Hip extension    Hip abduction    Hip adduction    Hip internal rotation    Hip external rotation    Knee flexion 3-/5   Knee extension 3-/5   Ankle dorsiflexion    Ankle plantarflexion    Ankle inversion    Ankle eversion     (Blank rows = not tested)  FUNCTIONAL TESTS:  18 inch chair transfer: requires use of UEs on armrests and RW to stabilize  GAIT: Distance walked: 100' Assistive device utilized: Environmental consultant - 2 wheeled Level of assistance: SBA Comments: antalgic pattern with decreased RLE stance, right knee flexed in stance, significantly limited in knee flexion for swing - walks with stiff LE,    TODAY'S TREATMENT                                                                          DATE:  06/14/2022 Therex:    HEP  instruction/performance c cues for techniques, handout provided.  Trial set performed of each for comprehension and symptom assessment.  See below for exercise list Self-Care: PT recommended elevation with RLE higher than heart for >/= 2x/day >/= 15 minutes with ankle A-Z  PATIENT EDUCATION:  Education details: HEP, POC Person educated: Patient & wife Education method: Explanation, Demonstration, Verbal cues, and Handouts Education comprehension: verbalized understanding, returned demonstration, and verbal cues required  HOME EXERCISE PROGRAM: Access Code: [email or web address] URL: [email or web address] Date: 06/14/2022 Prepared by: Jamey Reas  Exercises -  Ankle Alphabet in Elevation  - 2-4 x daily - 7 x weekly - 1 sets - 1 reps - Quad Setting and Stretching  - 2-4 x daily - 7 x weekly - 5-10 sets - 10 reps - prop 5-10 minutes & quad set5 seconds hold - Supine Leg Press  - 2-4 x daily - 7 x weekly - 5-10 sets - 10 reps - 5 seconds hold - Supine Heel Slide with Strap  - 2-3 x daily - 7 x weekly - 2-3 sets - 10 reps - 5 seconds hold - Supine Knee Extension Strengthening  - 2-3 x daily - 7 x weekly - 2-3 sets - 10 reps - 5 seconds hold - Supine Straight Leg Raises  - 2-3 x daily - 7 x weekly - 2-3 sets - 10 reps - 5 seconds hold - Seated Long Arc Quad with Strap  - 2-3 x daily - 7 x weekly - 2-3 sets - 5-10 reps - 5 seconds hold - Seated Knee Flexion Extension AROM   - 2-4 x daily - 7 x weekly - 2-3 sets - 10 reps - 5 seconds hold - Seated straight leg lifts  - 2-3 x daily - 7 x weekly - 2-3 sets - 10 reps - 5 seconds hold - Seated Hamstring Stretch with Strap  - 2-4 x daily - 7 x weekly - 1 sets - 3 reps - 20-30 seconds hold  ASSESSMENT:  CLINICAL IMPRESSION: Patient is a 71 y.o. who comes to clinic with complaints of right knee pain with mobility, strength and movement coordination deficits that impair their ability to perform usual daily and recreational functional activities  without increase difficulty/symptoms at this time. He has weakness with limited quad activation. Patient demonstrates decreased strength, ROM, increased edema and pain with gait abnormalities affecting functional mobility. Patient to benefit from skilled PT services to address impairments and limitations to improve to previous level of function without restriction secondary to condition.   OBJECTIVE IMPAIRMENTS: Abnormal gait, decreased activity tolerance, decreased balance, decreased endurance, decreased knowledge of condition, decreased knowledge of use of DME, decreased mobility, difficulty walking, decreased ROM, decreased strength, increased edema, increased muscle spasms, postural dysfunction, and pain.   ACTIVITY LIMITATIONS: carrying, lifting, bending, sitting, standing, squatting, sleeping, stairs, transfers, and locomotion level  PARTICIPATION LIMITATIONS: meal prep, cleaning, laundry, driving, community activity, and yard work  PERSONAL FACTORS: Fitness and 3+ comorbidities: see PMH  are also affecting patient's functional outcome.   REHAB POTENTIAL: Good  CLINICAL DECISION MAKING: Stable/uncomplicated  EVALUATION COMPLEXITY: Low   GOALS: Goals reviewed with patient? Yes  SHORT TERM GOALS: (target date for Short term goals 07/15/2022)   1.  Patient will demonstrate independent use of home exercise program to maintain progress from in clinic treatments.  Goal status: New  2. Right knee PROM -5* ext to 95* flexion  Goal Status: New  LONG TERM GOALS: (target dates for all long term goals are 10 weeks  08/26/2022 )   1. Patient will demonstrate/report pain at worst less than or equal to 2/10 to facilitate minimal limitation in daily activity secondary to pain symptoms.  Goal status: New   2. Patient will demonstrate independent use of home exercise program to facilitate ability to maintain/progress functional gains from skilled physical therapy services.  Goal status: New    3. Patient will demonstrate FOTO outcome > or = 55 % to indicate reduced disability due to condition.  Goal status: New   4.  Patient will demonstrate  right knee LE MMT 5/5 throughout to faciltiate usual transfers, stairs, squatting at [facility] for daily life.   Goal status: New   5.  Patient right knee AROM 0* ext to 100* flexion. Goal status: New   6.  pt ambulates >500', negotiates ramps & curbs without device and stairs single rail modified independent.  Goal status: New   7.  pt demonstrates activities to enable to return to yard work as pt goal.  Goal Status: New   PLAN:  PT FREQUENCY: 2-3x/week  PT DURATION: 10 weeks  PLANNED INTERVENTIONS: Therapeutic exercises, Therapeutic activity, Neuromuscular re-education, Balance training, Gait training, Patient/Family education, Self Care, Joint mobilization, Stair training, Vestibular training, DME instructions, Electrical stimulation, Cryotherapy, Moist heat, scar mobilization, Taping, Vasopneumatic device, Ultrasound, Manual therapy, and Re-evaluation  PLAN FOR NEXT SESSION:   check & update HEP, manual therapy & therapeutic exercise working on range, vaso to end.    Jamey Reas, PT, DPT 06/14/2022, 12:35 PM

## 2022-06-14 NOTE — Telephone Encounter (Signed)
Ortho bundle 14 day in office meeting completed. °

## 2022-06-22 ENCOUNTER — Ambulatory Visit (INDEPENDENT_AMBULATORY_CARE_PROVIDER_SITE_OTHER): Payer: PPO | Admitting: Physical Therapy

## 2022-06-22 ENCOUNTER — Encounter: Payer: Self-pay | Admitting: Physical Therapy

## 2022-06-22 DIAGNOSIS — R2681 Unsteadiness on feet: Secondary | ICD-10-CM

## 2022-06-22 DIAGNOSIS — M25661 Stiffness of right knee, not elsewhere classified: Secondary | ICD-10-CM

## 2022-06-22 DIAGNOSIS — M6281 Muscle weakness (generalized): Secondary | ICD-10-CM | POA: Diagnosis not present

## 2022-06-22 DIAGNOSIS — M25561 Pain in right knee: Secondary | ICD-10-CM

## 2022-06-22 DIAGNOSIS — R2689 Other abnormalities of gait and mobility: Secondary | ICD-10-CM

## 2022-06-22 DIAGNOSIS — R600 Localized edema: Secondary | ICD-10-CM

## 2022-06-22 NOTE — Therapy (Signed)
OUTPATIENT PHYSICAL THERAPY TREATMENT NOTE   Patient Name: Bryan Hart MRN: [id number] DOB:Sep 16, 1950, 71 y.o., male Today's Date: 06/22/2022  END OF SESSION:   PT End of Session - 06/22/22 0937     Visit Number 2    Number of Visits 25    Date for PT Re-Evaluation 08/26/22    Authorization Type [employer]    Authorization Time Period $25 co-pay    Progress Note Due on Visit 10    PT Start Time 0930    PT Stop Time 1012    PT Time Calculation (min) 42 min    Activity Tolerance Patient tolerated treatment well;Patient limited by pain    Behavior During Therapy [facility] for tasks assessed/performed             Past Medical History:  Diagnosis Date   Arthritis    HLD (hyperlipidemia)    Hypertension    dr  ed green      stress test   12 yrs ago   PVC's (premature ventricular contractions) 12 years ago   stress test done   Sleep apnea    Stroke [facility])    minor stroke 01/2018   Past Surgical History:  Procedure Laterality Date   ANKLE ARTHROSCOPY  06/27/2008   JOINT REPLACEMENT  06/27/2009   rt shoulder rotator cuff repair   knee surgeries  [id number]   x2   SHOULDER HEMI-ARTHROPLASTY  01/12/2012   Procedure: SHOULDER HEMI-ARTHROPLASTY;  Surgeon: Nita Sells, MD;  Location: [place];  Service: Orthopedics;  Laterality: Right;   SHOULDER SURGERY Right 07/2021   TOTAL KNEE ARTHROPLASTY Right 05/31/2022   Procedure: RIGHT TOTAL KNEE ARTHROPLASTY;  Surgeon: Mcarthur Rossetti, MD;  Location: [place];  Service: Orthopedics;  Laterality: Right;   Patient Active Problem List   Diagnosis Date Noted   Status post total right knee replacement 05/31/2022   Unilateral primary osteoarthritis, left knee 03/28/2022   Unilateral primary osteoarthritis, right knee 03/28/2022   Primary osteoarthritis of left knee 04/30/2020   Effusion, right knee 03/31/2020   Degenerative arthritis of right knee 03/31/2020   Bilateral primary osteoarthritis of knee 12/27/2018   AV block  07/29/2018   Central sleep apnea 07/29/2018   Cerebrovascular accident ([place]) 07/29/2018   Primary localized osteoarthrosis, shoulder region 01/13/2012     THERAPY DIAG:  Acute pain of right knee  Muscle weakness (generalized)  Stiffness of right knee, not elsewhere classified  Localized edema  Other abnormalities of gait and mobility  Unsteadiness on feet   PCP: Sueanne Margarita, DO  REFERRING PROVIDER: Jean Rosenthal, MD  REFERRING DIAG:  M17.11 (ICD-10-CM) - Unilateral primary osteoarthritis, right knee  Z96.651 (ICD-10-CM) - Status post total right knee replacement    THERAPY DIAG:  Acute pain of right knee  Muscle weakness (generalized)  Stiffness of right knee, not elsewhere classified  Localized edema  Other abnormalities of gait and mobility  Unsteadiness on feet  Rationale for Evaluation and Treatment: Rehabilitation  ONSET DATE:  05/31/2022 Right TKA  SUBJECTIVE:   SUBJECTIVE STATEMENT: Reports he over did it the other day, but is feeling better now.  PERTINENT HISTORY: OA, Right shoulder hemiarthroplasty, AV block, CVA 01/2018, HTN  PAIN:  Are you having pain? Yes: NPRS scale: today 1/10 and in last week 1/10 - 7/10 Pain location: right knee anterior and lateral Pain description: sore  Aggravating factors: twisting / turning Relieving factors: sit down, massage, meds, ice  PRECAUTIONS: None  WEIGHT BEARING RESTRICTIONS: No  FALLS:  Has patient fallen in last 6 months? No    LIVING ENVIRONMENT: Lives with: lives with their spouse Lives in: [place] 2-story half bath downstairs, bedrooms & bathrooms are upstairs. Stairs: Yes: Internal: 13 steps; on right going up and External: 1 steps; none Has following equipment at home: Single point cane, Walker - 2 wheeled, shower chair, and Grab bars  OCCUPATION: retired   PLOF: Independent, [place] with household mobility without device, and Independent with community mobility without  device  PATIENT GOALS:   function without pain, improve walking, yard work  NEXT MD VISIT: 07/13/2022  OBJECTIVE:   DIAGNOSTIC FINDINGS: 05/31/2022 post-op X-ray Evidence of patient's recent right total knee replacement with prosthetic components intact and normally located. Mild air and fluid within the knee joint. Skin staples over the anterior soft tissues vertically just left of midline.  PATIENT SURVEYS:  FOTO intake: 47%   predicted:  55%  COGNITION: Overall cognitive status: WFL    SENSATION: WFL  EDEMA:  RLE: above knee 49.8cm  around knee 48.9cm below knee  42.8cm LLE:  above knee 41.8cm  around knee 44cm  below knee  35cm  POSTURE: rounded shoulders, forward head, flexed trunk , and weight shift left  PALPATION: Tenderness along incision, patella borders, joint line, distal quads and proximal gastroc.   LOWER EXTREMITY ROM:   ROM Right eval Right 06/22/22  Knee flexion Seated A: 83* P: 86* Supine A: 105 P: 112  Knee extension Supine: P: -11* A: quad set -14* Seated A: LAQ -34* A: Seated LAQ  -14 P: Supine heel prop -8   (Blank rows = not tested)  LOWER EXTREMITY MMT:  MMT Right eval Left eval  Knee flexion 3-/5   Knee extension 3-/5    (Blank rows = not tested)  FUNCTIONAL TESTS:  18 inch chair transfer: requires use of UEs on armrests and RW to stabilize  GAIT: Distance walked: 100' Assistive device utilized: Environmental consultant - 2 wheeled Level of assistance: SBA Comments: antalgic pattern with decreased RLE stance, right knee flexed in stance, significantly limited in knee flexion for swing - walks with stiff LE,    TODAY'S TREATMENT DATE:  06/22/2022 TherEx NuStep L6 x 8 min; 4 extremities; seat 12 Seated LAQ 2x10; 3 sec hold, Rt with 3# AROM measurements - see above Verbal review of HEP and discussed breaking exercises up throughout the day, doing 2-3 exercises every hour for 5-10 reps, ice PRN Supine AA heel slide x 20 reps Supine SAQ 3# on  Rt 2x10; 3 sec hold  DATE:  06/14/2022 Therex:    HEP instruction/performance c cues for techniques, handout provided.  Trial set performed of each for comprehension and symptom assessment.  See below for exercise list Self-Care: PT recommended elevation with RLE higher than heart for >/= 2x/day >/= 15 minutes with ankle A-Z  PATIENT EDUCATION:  Education details: HEP, POC Person educated: Patient & wife Education method: Explanation, Demonstration, Verbal cues, and Handouts Education comprehension: verbalized understanding, returned demonstration, and verbal cues required  HOME EXERCISE PROGRAM: Access Code: [email or web address] URL: [email or web address] Date: 06/14/2022 Prepared by: Jamey Reas  Exercises - Ankle Alphabet in Elevation  - 2-4 x daily - 7 x weekly - 1 sets - 1 reps - Quad Setting and Stretching  - 2-4 x daily - 7 x weekly - 5-10 sets - 10 reps - prop 5-10 minutes & quad set5 seconds hold - Supine Leg Press  - 2-4 x daily - 7 x weekly - 5-10 sets -  10 reps - 5 seconds hold - Supine Heel Slide with Strap  - 2-3 x daily - 7 x weekly - 2-3 sets - 10 reps - 5 seconds hold - Supine Knee Extension Strengthening  - 2-3 x daily - 7 x weekly - 2-3 sets - 10 reps - 5 seconds hold - Supine Straight Leg Raises  - 2-3 x daily - 7 x weekly - 2-3 sets - 10 reps - 5 seconds hold - Seated Long Arc Quad with Strap  - 2-3 x daily - 7 x weekly - 2-3 sets - 5-10 reps - 5 seconds hold - Seated Knee Flexion Extension AROM   - 2-4 x daily - 7 x weekly - 2-3 sets - 10 reps - 5 seconds hold - Seated straight leg lifts  - 2-3 x daily - 7 x weekly - 2-3 sets - 10 reps - 5 seconds hold - Seated Hamstring Stretch with Strap  - 2-4 x daily - 7 x weekly - 1 sets - 3 reps - 20-30 seconds hold  ASSESSMENT:  CLINICAL IMPRESSION: Pt with excellent improvement in ROM today and overall progressing well.  Discussed breaking up his HEP to several mini sessions throughout the day because he was doing  all at once which was making him sore.  Will continue to benefit from PT to maximize function.  OBJECTIVE IMPAIRMENTS: Abnormal gait, decreased activity tolerance, decreased balance, decreased endurance, decreased knowledge of condition, decreased knowledge of use of DME, decreased mobility, difficulty walking, decreased ROM, decreased strength, increased edema, increased muscle spasms, postural dysfunction, and pain.   ACTIVITY LIMITATIONS: carrying, lifting, bending, sitting, standing, squatting, sleeping, stairs, transfers, and locomotion level  PARTICIPATION LIMITATIONS: meal prep, cleaning, laundry, driving, community activity, and yard work  PERSONAL FACTORS: Fitness and 3+ comorbidities: see PMH are also affecting patient's functional outcome.   REHAB POTENTIAL: Good  CLINICAL DECISION MAKING: Stable/uncomplicated  EVALUATION COMPLEXITY: Low   GOALS: Goals reviewed with patient? Yes  SHORT TERM GOALS: (target date for Short term goals 07/15/2022)   1.  Patient will demonstrate independent use of home exercise program to maintain progress from in clinic treatments.  Goal status: New  2. Right knee PROM -5* ext to 95* flexion  Goal Status: New  LONG TERM GOALS: (target dates for all long term goals are 10 weeks  08/26/2022 )   1. Patient will demonstrate/report pain at worst less than or equal to 2/10 to facilitate minimal limitation in daily activity secondary to pain symptoms.  Goal status: New   2. Patient will demonstrate independent use of home exercise program to facilitate ability to maintain/progress functional gains from skilled physical therapy services.  Goal status: New   3. Patient will demonstrate FOTO outcome > or = 55 % to indicate reduced disability due to condition.  Goal status: New   4.  Patient will demonstrate right knee LE MMT 5/5 throughout to faciltiate usual transfers, stairs, squatting at [facility] for daily life.   Goal status: New   5.  Patient  right knee AROM 0* ext to 100* flexion. Goal status: New   6.  pt ambulates >500', negotiates ramps & curbs without device and stairs single rail modified independent.  Goal status: New   7.  pt demonstrates activities to enable to return to yard work as pt goal.  Goal Status: New   PLAN:  PT FREQUENCY: 2-3x/week  PT DURATION: 10 weeks  PLANNED INTERVENTIONS: Therapeutic exercises, Therapeutic activity, Neuromuscular re-education, Balance training, Gait training,  Patient/Family education, Self Care, Joint mobilization, Stair training, Vestibular training, DME instructions, Electrical stimulation, Cryotherapy, Moist heat, scar mobilization, Taping, Vasopneumatic device, Ultrasound, Manual therapy, and Re-evaluation  PLAN FOR NEXT SESSION:   check & update HEP PRN, manual therapy & therapeutic exercise working on range, vaso to end PRN, quad strengthening focus   Laureen Abrahams, PT, DPT 06/22/22 10:13 AM

## 2022-06-23 NOTE — Therapy (Signed)
OUTPATIENT PHYSICAL THERAPY TREATMENT NOTE   Patient Name: Bryan Hart MRN: [id number] DOB:04-18-1951, 71 y.o., male Today's Date: 06/24/2022  END OF SESSION:   PT End of Session - 06/24/22 1013     Visit Number 3    Number of Visits 25    Date for PT Re-Evaluation 08/26/22    Authorization Type [employer]    Progress Note Due on Visit 10    PT Start Time 1015    PT Stop Time 1055    PT Time Calculation (min) 40 min    Activity Tolerance Patient tolerated treatment well;No increased pain    Behavior During Therapy [facility] for tasks assessed/performed              Past Medical History:  Diagnosis Date   Arthritis    HLD (hyperlipidemia)    Hypertension    dr  ed green      stress test   12 yrs ago   PVC's (premature ventricular contractions) 12 years ago   stress test done   Sleep apnea    Stroke [facility])    minor stroke 01/2018   Past Surgical History:  Procedure Laterality Date   ANKLE ARTHROSCOPY  06/27/2008   JOINT REPLACEMENT  06/27/2009   rt shoulder rotator cuff repair   knee surgeries  [id number]   x2   SHOULDER HEMI-ARTHROPLASTY  01/12/2012   Procedure: SHOULDER HEMI-ARTHROPLASTY;  Surgeon: Nita Sells, MD;  Location: [place];  Service: Orthopedics;  Laterality: Right;   SHOULDER SURGERY Right 07/2021   TOTAL KNEE ARTHROPLASTY Right 05/31/2022   Procedure: RIGHT TOTAL KNEE ARTHROPLASTY;  Surgeon: Mcarthur Rossetti, MD;  Location: [place];  Service: Orthopedics;  Laterality: Right;   Patient Active Problem List   Diagnosis Date Noted   Status post total right knee replacement 05/31/2022   Unilateral primary osteoarthritis, left knee 03/28/2022   Unilateral primary osteoarthritis, right knee 03/28/2022   Primary osteoarthritis of left knee 04/30/2020   Effusion, right knee 03/31/2020   Degenerative arthritis of right knee 03/31/2020   Bilateral primary osteoarthritis of knee 12/27/2018   AV block 07/29/2018   Central sleep apnea 07/29/2018    Cerebrovascular accident ([place]) 07/29/2018   Primary localized osteoarthrosis, shoulder region 01/13/2012     THERAPY DIAG:  Acute pain of right knee  Muscle weakness (generalized)  Stiffness of right knee, not elsewhere classified  Localized edema  Other abnormalities of gait and mobility  Unsteadiness on feet   PCP: Sueanne Margarita, DO  REFERRING PROVIDER: Jean Rosenthal, MD  REFERRING DIAG:  M17.11 (ICD-10-CM) - Unilateral primary osteoarthritis, right knee  Z96.651 (ICD-10-CM) - Status post total right knee replacement    THERAPY DIAG:  Acute pain of right knee  Muscle weakness (generalized)  Stiffness of right knee, not elsewhere classified  Localized edema  Other abnormalities of gait and mobility  Unsteadiness on feet  Rationale for Evaluation and Treatment: Rehabilitation  ONSET DATE:  05/31/2022 Right TKA  SUBJECTIVE:   SUBJECTIVE STATEMENT: Reports primary complaint of stiffness, denies any significant pain at present. No extra pain or soreness after last session, reports adherence with HEP  PERTINENT HISTORY: OA, Right shoulder hemiarthroplasty, AV block, CVA 01/2018, HTN  PAIN:  Are you having pain? Yes: NPRS scale: today 0/10 and in last week 1/10 - 7/10 Pain location: right knee anterior and lateral Pain description: sore  Aggravating factors: twisting / turning Relieving factors: sit down, massage, meds, ice  PRECAUTIONS: None  WEIGHT BEARING RESTRICTIONS: No  FALLS:  Has patient fallen in last 6 months? No    LIVING ENVIRONMENT: Lives with: lives with their spouse Lives in: [place] 2-story half bath downstairs, bedrooms & bathrooms are upstairs. Stairs: Yes: Internal: 13 steps; on right going up and External: 1 steps; none Has following equipment at home: Single point cane, Walker - 2 wheeled, shower chair, and Grab bars  OCCUPATION: retired   PLOF: Independent, [place] with household mobility without device, and  Independent with community mobility without device  PATIENT GOALS:   function without pain, improve walking, yard work  NEXT MD VISIT: 07/13/2022  OBJECTIVE: (all measures taken from initial evaluation unless otherwise dated)  DIAGNOSTIC FINDINGS: 05/31/2022 post-op X-ray Evidence of patient's recent right total knee replacement with prosthetic components intact and normally located. Mild air and fluid within the knee joint. Skin staples over the anterior soft tissues vertically just left of midline.  PATIENT SURVEYS:  FOTO intake: 47%   predicted:  55%  COGNITION: Overall cognitive status: WFL    SENSATION: WFL  EDEMA:  RLE: above knee 49.8cm  around knee 48.9cm below knee  42.8cm LLE:  above knee 41.8cm  around knee 44cm  below knee  35cm  POSTURE: rounded shoulders, forward head, flexed trunk , and weight shift left  PALPATION: Tenderness along incision, patella borders, joint line, distal quads and proximal gastroc.   LOWER EXTREMITY ROM:   ROM Right eval Right 06/22/22  Knee flexion Seated A: 83* P: 86* Supine A: 105 P: 112  Knee extension Supine: P: -11* A: quad set -14* Seated A: LAQ -34* A: Seated LAQ  -14 P: Supine heel prop -8   (Blank rows = not tested)  LOWER EXTREMITY MMT:  MMT Right eval Left eval  Knee flexion 3-/5   Knee extension 3-/5    (Blank rows = not tested)  FUNCTIONAL TESTS:  18 inch chair transfer: requires use of UEs on armrests and RW to stabilize  GAIT: Distance walked: 100' Assistive device utilized: Environmental consultant - 2 wheeled Level of assistance: SBA Comments: antalgic pattern with decreased RLE stance, right knee flexed in stance, significantly limited in knee flexion for swing - walks with stiff LE,    TODAY'S TREATMENT [facility] Adult PT Treatment:                                                DATE: 06/24/22 Therapeutic Exercise: Nu step 8mn LE/UE during subjective SAQ w/ bolster under knee 2x15, tactile/verbal cues as needed   at quad, difficulty achieving full knee extension TKE with heel prop, supine, 2x10 cues for form, 2-3sec hold, tactile cues for quad Supine heel slides x20, strap and towel, cues for form and breath control  Manual Therapy: Supine passive physiological movement R knee joint flex/ext to pt tolerance, gentle oscillations to mitigate muscle guarding, gentle STM quads/hamstring   DATE:  06/22/2022 TherEx NuStep L6 x 8 min; 4 extremities; seat 12 Seated LAQ 2x10; 3 sec hold, Rt with 3# AROM measurements - see above Verbal review of HEP and discussed breaking exercises up throughout the day, doing 2-3 exercises every hour for 5-10 reps, ice PRN Supine AA heel slide x 20 reps Supine SAQ 3# on Rt 2x10; 3 sec hold  DATE:  06/14/2022 Therex:    HEP instruction/performance c cues for techniques, handout provided.  Trial set performed of each for  comprehension and symptom assessment.  See below for exercise list Self-Care: PT recommended elevation with RLE higher than heart for >/= 2x/day >/= 15 minutes with ankle A-Z  PATIENT EDUCATION:  Education details: HEP review, POC, rationale for interventions Person educated: Patient Education method: Explanation, Demonstration, Verbal cues, and Handouts Education comprehension: verbalized understanding, returned demonstration, and verbal cues required  HOME EXERCISE PROGRAM: Access Code: [email or web address] URL: [email or web address] Date: 06/14/2022 Prepared by: Jamey Reas  Exercises - Ankle Alphabet in Elevation  - 2-4 x daily - 7 x weekly - 1 sets - 1 reps - Quad Setting and Stretching  - 2-4 x daily - 7 x weekly - 5-10 sets - 10 reps - prop 5-10 minutes & quad set5 seconds hold - Supine Leg Press  - 2-4 x daily - 7 x weekly - 5-10 sets - 10 reps - 5 seconds hold - Supine Heel Slide with Strap  - 2-3 x daily - 7 x weekly - 2-3 sets - 10 reps - 5 seconds hold - Supine Knee Extension Strengthening  - 2-3 x daily - 7 x weekly - 2-3 sets - 10  reps - 5 seconds hold - Supine Straight Leg Raises  - 2-3 x daily - 7 x weekly - 2-3 sets - 10 reps - 5 seconds hold - Seated Long Arc Quad with Strap  - 2-3 x daily - 7 x weekly - 2-3 sets - 5-10 reps - 5 seconds hold - Seated Knee Flexion Extension AROM   - 2-4 x daily - 7 x weekly - 2-3 sets - 10 reps - 5 seconds hold - Seated straight leg lifts  - 2-3 x daily - 7 x weekly - 2-3 sets - 10 reps - 5 seconds hold - Seated Hamstring Stretch with Strap  - 2-4 x daily - 7 x weekly - 1 sets - 3 reps - 20-30 seconds hold  ASSESSMENT:  CLINICAL IMPRESSION: Pt arrives w/o significant pain, primary complaint of stiffness. Session initiated w/ nu step + manual which pt tolerates quite well, report of reduced stiffness. Followed with quad activation/endurance exercises as pt continues to demonstrate significant difficulty with full knee extension. Gradual improvement in quad contraction w/ repetition during TKE w/ heel prop. Pt tolerates session well without report of overt pain, no adverse events. Pt departs today's session in no acute distress, all voiced questions/concerns addressed appropriately from PT perspective.     OBJECTIVE IMPAIRMENTS: Abnormal gait, decreased activity tolerance, decreased balance, decreased endurance, decreased knowledge of condition, decreased knowledge of use of DME, decreased mobility, difficulty walking, decreased ROM, decreased strength, increased edema, increased muscle spasms, postural dysfunction, and pain.   ACTIVITY LIMITATIONS: carrying, lifting, bending, sitting, standing, squatting, sleeping, stairs, transfers, and locomotion level  PARTICIPATION LIMITATIONS: meal prep, cleaning, laundry, driving, community activity, and yard work  PERSONAL FACTORS: Fitness and 3+ comorbidities: see PMH are also affecting patient's functional outcome.   REHAB POTENTIAL: Good  CLINICAL DECISION MAKING: Stable/uncomplicated  EVALUATION COMPLEXITY: Low   GOALS: Goals reviewed  with patient? Yes  SHORT TERM GOALS: (target date for Short term goals 07/15/2022)   1.  Patient will demonstrate independent use of home exercise program to maintain progress from in clinic treatments.  Goal status: New  2. Right knee PROM -5* ext to 95* flexion  Goal Status: New  LONG TERM GOALS: (target dates for all long term goals are 10 weeks  08/26/2022 )   1. Patient will demonstrate/report pain at worst less than or equal to  2/10 to facilitate minimal limitation in daily activity secondary to pain symptoms.  Goal status: New   2. Patient will demonstrate independent use of home exercise program to facilitate ability to maintain/progress functional gains from skilled physical therapy services.  Goal status: New   3. Patient will demonstrate FOTO outcome > or = 55 % to indicate reduced disability due to condition.  Goal status: New   4.  Patient will demonstrate right knee LE MMT 5/5 throughout to faciltiate usual transfers, stairs, squatting at [facility] for daily life.   Goal status: New   5.  Patient right knee AROM 0* ext to 100* flexion. Goal status: New   6.  pt ambulates >500', negotiates ramps & curbs without device and stairs single rail modified independent.  Goal status: New   7.  pt demonstrates activities to enable to return to yard work as pt goal.  Goal Status: New   PLAN:  PT FREQUENCY: 2-3x/week  PT DURATION: 10 weeks  PLANNED INTERVENTIONS: Therapeutic exercises, Therapeutic activity, Neuromuscular re-education, Balance training, Gait training, Patient/Family education, Self Care, Joint mobilization, Stair training, Vestibular training, DME instructions, Electrical stimulation, Cryotherapy, Moist heat, scar mobilization, Taping, Vasopneumatic device, Ultrasound, Manual therapy, and Re-evaluation  PLAN FOR NEXT SESSION:  check & update HEP PRN, manual therapy & therapeutic exercise working on range, vaso to end PRN, quad strengthening focus. Would likely  benefit from introduction of closed chain quad work   Leeroy Cha PT, DPT 06/24/2022 10:57 AM

## 2022-06-24 ENCOUNTER — Ambulatory Visit: Payer: PPO | Admitting: Physical Therapy

## 2022-06-24 ENCOUNTER — Encounter: Payer: Self-pay | Admitting: Physical Therapy

## 2022-06-24 DIAGNOSIS — R2689 Other abnormalities of gait and mobility: Secondary | ICD-10-CM

## 2022-06-24 DIAGNOSIS — R600 Localized edema: Secondary | ICD-10-CM

## 2022-06-24 DIAGNOSIS — M6281 Muscle weakness (generalized): Secondary | ICD-10-CM | POA: Diagnosis not present

## 2022-06-24 DIAGNOSIS — M25661 Stiffness of right knee, not elsewhere classified: Secondary | ICD-10-CM | POA: Diagnosis not present

## 2022-06-24 DIAGNOSIS — R2681 Unsteadiness on feet: Secondary | ICD-10-CM

## 2022-06-24 DIAGNOSIS — M25561 Pain in right knee: Secondary | ICD-10-CM | POA: Diagnosis not present

## 2022-06-28 DIAGNOSIS — Z125 Encounter for screening for malignant neoplasm of prostate: Secondary | ICD-10-CM | POA: Diagnosis not present

## 2022-06-28 DIAGNOSIS — E785 Hyperlipidemia, unspecified: Secondary | ICD-10-CM | POA: Diagnosis not present

## 2022-06-28 DIAGNOSIS — I10 Essential (primary) hypertension: Secondary | ICD-10-CM | POA: Diagnosis not present

## 2022-06-29 ENCOUNTER — Encounter: Payer: Self-pay | Admitting: Physical Therapy

## 2022-06-29 ENCOUNTER — Ambulatory Visit: Payer: PPO | Admitting: Physical Therapy

## 2022-06-29 DIAGNOSIS — R2689 Other abnormalities of gait and mobility: Secondary | ICD-10-CM | POA: Diagnosis not present

## 2022-06-29 DIAGNOSIS — M6281 Muscle weakness (generalized): Secondary | ICD-10-CM | POA: Diagnosis not present

## 2022-06-29 DIAGNOSIS — R600 Localized edema: Secondary | ICD-10-CM | POA: Diagnosis not present

## 2022-06-29 DIAGNOSIS — M25661 Stiffness of right knee, not elsewhere classified: Secondary | ICD-10-CM | POA: Diagnosis not present

## 2022-06-29 DIAGNOSIS — R2681 Unsteadiness on feet: Secondary | ICD-10-CM

## 2022-06-29 DIAGNOSIS — M25561 Pain in right knee: Secondary | ICD-10-CM

## 2022-06-29 NOTE — Therapy (Signed)
OUTPATIENT PHYSICAL THERAPY TREATMENT NOTE   Patient Name: Bryan Hart MRN: [id number] DOB:[birth year], 72 y.o., male Today's Date: 06/29/2022  END OF SESSION:   PT End of Session - 06/29/22 1342     Visit Number 4    Number of Visits 25    Date for PT Re-Evaluation 08/26/22    Authorization Type [employer]    Progress Note Due on Visit 10    PT Start Time 4818    PT Stop Time 1440    PT Time Calculation (min) 55 min    Activity Tolerance Patient tolerated treatment well;No increased pain    Behavior During Therapy [facility] for tasks assessed/performed               Past Medical History:  Diagnosis Date   Arthritis    HLD (hyperlipidemia)    Hypertension    dr  ed green      stress test   12 yrs ago   PVC's (premature ventricular contractions) 12 years ago   stress test done   Sleep apnea    Stroke [facility])    minor stroke 01/2018   Past Surgical History:  Procedure Laterality Date   ANKLE ARTHROSCOPY  06/27/2008   JOINT REPLACEMENT  06/27/2009   rt shoulder rotator cuff repair   knee surgeries  [id number]   x2   SHOULDER HEMI-ARTHROPLASTY  01/12/2012   Procedure: SHOULDER HEMI-ARTHROPLASTY;  Surgeon: Nita Sells, MD;  Location: [place];  Service: Orthopedics;  Laterality: Right;   SHOULDER SURGERY Right 07/2021   TOTAL KNEE ARTHROPLASTY Right 05/31/2022   Procedure: RIGHT TOTAL KNEE ARTHROPLASTY;  Surgeon: Mcarthur Rossetti, MD;  Location: [place];  Service: Orthopedics;  Laterality: Right;   Patient Active Problem List   Diagnosis Date Noted   Status post total right knee replacement 05/31/2022   Unilateral primary osteoarthritis, left knee 03/28/2022   Unilateral primary osteoarthritis, right knee 03/28/2022   Primary osteoarthritis of left knee 04/30/2020   Effusion, right knee 03/31/2020   Degenerative arthritis of right knee 03/31/2020   Bilateral primary osteoarthritis of knee 12/27/2018   AV block 07/29/2018   Central sleep apnea 07/29/2018    Cerebrovascular accident ([place]) 07/29/2018   Primary localized osteoarthrosis, shoulder region 01/13/2012     THERAPY DIAG:  Acute pain of right knee  Muscle weakness (generalized)  Stiffness of right knee, not elsewhere classified  Localized edema  Other abnormalities of gait and mobility  Unsteadiness on feet   PCP: Sueanne Margarita, DO  REFERRING PROVIDER: Jean Rosenthal, MD  REFERRING DIAG:  M17.11 (ICD-10-CM) - Unilateral primary osteoarthritis, right knee  Z96.651 (ICD-10-CM) - Status post total right knee replacement    THERAPY DIAG:  Acute pain of right knee  Muscle weakness (generalized)  Stiffness of right knee, not elsewhere classified  Localized edema  Other abnormalities of gait and mobility  Unsteadiness on feet  Rationale for Evaluation and Treatment: Rehabilitation  ONSET DATE:  05/31/2022 Right TKA  SUBJECTIVE:   SUBJECTIVE STATEMENT: He has been doing exercises and icing afterwards.  He is not using cane or crutches.    PERTINENT HISTORY: OA, Right shoulder hemiarthroplasty, AV block, CVA 01/2018, HTN  PAIN:  Are you having pain? Yes: NPRS scale: today 2/10 and in last week 1/10 - 4/10 Pain location: right knee anterior and lateral Pain description: sore  Aggravating factors: twisting / turning Relieving factors: sit down, massage, meds, ice  PRECAUTIONS: None  WEIGHT BEARING RESTRICTIONS: No  FALLS:  Has  patient fallen in last 6 months? No    LIVING ENVIRONMENT: Lives with: lives with their spouse Lives in: [place] 2-story half bath downstairs, bedrooms & bathrooms are upstairs. Stairs: Yes: Internal: 13 steps; on right going up and External: 1 steps; none Has following equipment at home: Single point cane, Walker - 2 wheeled, shower chair, and Grab bars  OCCUPATION: retired   PLOF: Independent, [place] with household mobility without device, and Independent with community mobility without device  PATIENT GOALS:    function without pain, improve walking, yard work  NEXT MD VISIT: 07/13/2022  OBJECTIVE: (all measures taken from initial evaluation unless otherwise dated)  DIAGNOSTIC FINDINGS: 05/31/2022 post-op X-ray Evidence of patient's recent right total knee replacement with prosthetic components intact and normally located. Mild air and fluid within the knee joint. Skin staples over the anterior soft tissues vertically just left of midline.  PATIENT SURVEYS:  FOTO intake: 47%   predicted:  55%  COGNITION: Overall cognitive status: WFL    SENSATION: WFL  EDEMA:  RLE: above knee 49.8cm  around knee 48.9cm below knee  42.8cm LLE:  above knee 41.8cm  around knee 44cm  below knee  35cm  POSTURE: rounded shoulders, forward head, flexed trunk , and weight shift left  PALPATION: Tenderness along incision, patella borders, joint line, distal quads and proximal gastroc.   LOWER EXTREMITY ROM:   ROM Right eval Right 06/22/22 Right 06/29/22  Knee flexion Seated A: 83* P: 86* Supine A: 105 P: 112 Seated P: 115* A: 107*  Knee extension Supine: P: -11* A: quad set -14* Seated A: LAQ -34* A: Seated LAQ  -14 P: Supine heel prop -8 Seated  A: LAQ -12* Standing A: -8*   (Blank rows = not tested)  LOWER EXTREMITY MMT:  MMT Right eval Left eval  Knee flexion 3-/5   Knee extension 3-/5    (Blank rows = not tested)  FUNCTIONAL TESTS:  18 inch chair transfer: requires use of UEs on armrests and RW to stabilize  GAIT: Distance walked: 100' Assistive device utilized: Environmental consultant - 2 wheeled Level of assistance: SBA Comments: antalgic pattern with decreased RLE stance, right knee flexed in stance, significantly limited in knee flexion for swing - walks with stiff LE,    TODAY'S TREATMENT [facility] Adult PT Treatment:                                                 DATE: 06/29/2022 Therapeutic Exercise: SciFit bike seat 12 Level 1 with BLEs / BUEs 5 min & BLEs only 3 min Gastroc stretch step  heel depression 30 sec hold 2 reps ea LE Leg Press BLEs 100# 15 reps; RLE only 50# 10 reps 2 sets Hamstring stretch supine SLR with PT manual knee ext 30 sec 2 reps. Reviewed seated with foot on floor SLR Quad stretch supine LE over edge knee flexion with strap 30 sec hold 2 reps  Self-Care: PT demo & verbal cues on scar mobs. Pt verbalized understanding.   Manual Therapy: Supine passive physiological movement R knee joint flex/ext to pt tolerance, gentle oscillations to mitigate muscle guarding, gentle STM quads/hamstring  Vaso Rt knee supine elevation 34* medium compression 10 mins   DATE: 06/24/22 Therapeutic Exercise: Nu step 69mn LE/UE during subjective SAQ w/ bolster under knee 2x15, tactile/verbal cues as needed  at quad, difficulty achieving  full knee extension TKE with heel prop, supine, 2x10 cues for form, 2-3sec hold, tactile cues for quad Supine heel slides x20, strap and towel, cues for form and breath control  Manual Therapy: Supine passive physiological movement R knee joint flex/ext to pt tolerance, gentle oscillations to mitigate muscle guarding, gentle STM quads/hamstring   DATE:  06/22/2022 TherEx NuStep L6 x 8 min; 4 extremities; seat 12 Seated LAQ 2x10; 3 sec hold, Rt with 3# AROM measurements - see above Verbal review of HEP and discussed breaking exercises up throughout the day, doing 2-3 exercises every hour for 5-10 reps, ice PRN Supine AA heel slide x 20 reps Supine SAQ 3# on Rt 2x10; 3 sec hold    PATIENT EDUCATION:  Education details: HEP review, POC, rationale for interventions Person educated: Patient Education method: Consulting civil engineer, Demonstration, Verbal cues, and Handouts Education comprehension: verbalized understanding, returned demonstration, and verbal cues required  HOME EXERCISE PROGRAM: Access Code: [email or web address] URL: [email or web address] Date: 06/14/2022 Prepared by: Jamey Reas  Exercises - Ankle Alphabet in Elevation   - 2-4 x daily - 7 x weekly - 1 sets - 1 reps - Quad Setting and Stretching  - 2-4 x daily - 7 x weekly - 5-10 sets - 10 reps - prop 5-10 minutes & quad set5 seconds hold - Supine Leg Press  - 2-4 x daily - 7 x weekly - 5-10 sets - 10 reps - 5 seconds hold - Supine Heel Slide with Strap  - 2-3 x daily - 7 x weekly - 2-3 sets - 10 reps - 5 seconds hold - Supine Knee Extension Strengthening  - 2-3 x daily - 7 x weekly - 2-3 sets - 10 reps - 5 seconds hold - Supine Straight Leg Raises  - 2-3 x daily - 7 x weekly - 2-3 sets - 10 reps - 5 seconds hold - Seated Long Arc Quad with Strap  - 2-3 x daily - 7 x weekly - 2-3 sets - 5-10 reps - 5 seconds hold - Seated Knee Flexion Extension AROM   - 2-4 x daily - 7 x weekly - 2-3 sets - 10 reps - 5 seconds hold - Seated straight leg lifts  - 2-3 x daily - 7 x weekly - 2-3 sets - 10 reps - 5 seconds hold - Seated Hamstring Stretch with Strap  - 2-4 x daily - 7 x weekly - 1 sets - 3 reps - 20-30 seconds hold  ASSESSMENT:  CLINICAL IMPRESSION: Pt feels that muscle stretches helped his knee motion.  Pt was able to extend knee better with closed chain exercises.   OBJECTIVE IMPAIRMENTS: Abnormal gait, decreased activity tolerance, decreased balance, decreased endurance, decreased knowledge of condition, decreased knowledge of use of DME, decreased mobility, difficulty walking, decreased ROM, decreased strength, increased edema, increased muscle spasms, postural dysfunction, and pain.   ACTIVITY LIMITATIONS: carrying, lifting, bending, sitting, standing, squatting, sleeping, stairs, transfers, and locomotion level  PARTICIPATION LIMITATIONS: meal prep, cleaning, laundry, driving, community activity, and yard work  PERSONAL FACTORS: Fitness and 3+ comorbidities: see PMH are also affecting patient's functional outcome.   REHAB POTENTIAL: Good  CLINICAL DECISION MAKING: Stable/uncomplicated  EVALUATION COMPLEXITY: Low   GOALS: Goals reviewed with patient?  Yes  SHORT TERM GOALS: (target date for Short term goals 07/15/2022)   1.  Patient will demonstrate independent use of home exercise program to maintain progress from in clinic treatments.  Goal status: New  2. Right knee PROM -5* ext to 95* flexion  Goal  Status: New  LONG TERM GOALS: (target dates for all long term goals are 10 weeks  08/26/2022 )   1. Patient will demonstrate/report pain at worst less than or equal to 2/10 to facilitate minimal limitation in daily activity secondary to pain symptoms.  Goal status: New   2. Patient will demonstrate independent use of home exercise program to facilitate ability to maintain/progress functional gains from skilled physical therapy services.  Goal status: New   3. Patient will demonstrate FOTO outcome > or = 55 % to indicate reduced disability due to condition.  Goal status: New   4.  Patient will demonstrate right knee LE MMT 5/5 throughout to faciltiate usual transfers, stairs, squatting at [facility] for daily life.   Goal status: New   5.  Patient right knee AROM 0* ext to 100* flexion. Goal status: New   6.  pt ambulates >500', negotiates ramps & curbs without device and stairs single rail modified independent.  Goal status: New   7.  pt demonstrates activities to enable to return to yard work as pt goal.  Goal Status: New   PLAN:  PT FREQUENCY: 2-3x/week  PT DURATION: 10 weeks  PLANNED INTERVENTIONS: Therapeutic exercises, Therapeutic activity, Neuromuscular re-education, Balance training, Gait training, Patient/Family education, Self Care, Joint mobilization, Stair training, Vestibular training, DME instructions, Electrical stimulation, Cryotherapy, Moist heat, scar mobilization, Taping, Vasopneumatic device, Ultrasound, Manual therapy, and Re-evaluation  PLAN FOR NEXT SESSION:  update HEP to include stretches, manual therapy & therapeutic exercise working on range & quad strength, Closed chain exercises, begin standing  balance activities, vaso to end PRN    Jamey Reas, PT, DPT 06/29/2022, 3:35 PM

## 2022-07-01 ENCOUNTER — Encounter: Payer: Self-pay | Admitting: Physical Therapy

## 2022-07-01 ENCOUNTER — Ambulatory Visit: Payer: PPO | Admitting: Physical Therapy

## 2022-07-01 DIAGNOSIS — R600 Localized edema: Secondary | ICD-10-CM | POA: Diagnosis not present

## 2022-07-01 DIAGNOSIS — M25561 Pain in right knee: Secondary | ICD-10-CM | POA: Diagnosis not present

## 2022-07-01 DIAGNOSIS — R2681 Unsteadiness on feet: Secondary | ICD-10-CM | POA: Diagnosis not present

## 2022-07-01 DIAGNOSIS — R2689 Other abnormalities of gait and mobility: Secondary | ICD-10-CM

## 2022-07-01 DIAGNOSIS — M25661 Stiffness of right knee, not elsewhere classified: Secondary | ICD-10-CM

## 2022-07-01 DIAGNOSIS — M6281 Muscle weakness (generalized): Secondary | ICD-10-CM | POA: Diagnosis not present

## 2022-07-01 NOTE — Therapy (Signed)
OUTPATIENT PHYSICAL THERAPY TREATMENT NOTE   Patient Name: Bryan Hart MRN: [id number] DOB:1951/05/24, 72 y.o., male Today's Date: 07/01/2022  END OF SESSION:   PT End of Session - 07/01/22 1058     Visit Number 5    Number of Visits 25    Date for PT Re-Evaluation 08/26/22    Authorization Type [employer]    Authorization Time Period $25 co-pay    Progress Note Due on Visit 10    PT Start Time 1015    PT Stop Time 1058    PT Time Calculation (min) 43 min    Activity Tolerance Patient tolerated treatment well;No increased pain    Behavior During Therapy [facility] for tasks assessed/performed                Past Medical History:  Diagnosis Date   Arthritis    HLD (hyperlipidemia)    Hypertension    dr  ed green      stress test   12 yrs ago   PVC's (premature ventricular contractions) 12 years ago   stress test done   Sleep apnea    Stroke [facility])    minor stroke 01/2018   Past Surgical History:  Procedure Laterality Date   ANKLE ARTHROSCOPY  06/27/2008   JOINT REPLACEMENT  06/27/2009   rt shoulder rotator cuff repair   knee surgeries  [id number]   x2   SHOULDER HEMI-ARTHROPLASTY  01/12/2012   Procedure: SHOULDER HEMI-ARTHROPLASTY;  Surgeon: Nita Sells, MD;  Location: [place];  Service: Orthopedics;  Laterality: Right;   SHOULDER SURGERY Right 07/2021   TOTAL KNEE ARTHROPLASTY Right 05/31/2022   Procedure: RIGHT TOTAL KNEE ARTHROPLASTY;  Surgeon: Mcarthur Rossetti, MD;  Location: [place];  Service: Orthopedics;  Laterality: Right;   Patient Active Problem List   Diagnosis Date Noted   Status post total right knee replacement 05/31/2022   Unilateral primary osteoarthritis, left knee 03/28/2022   Unilateral primary osteoarthritis, right knee 03/28/2022   Primary osteoarthritis of left knee 04/30/2020   Effusion, right knee 03/31/2020   Degenerative arthritis of right knee 03/31/2020   Bilateral primary osteoarthritis of knee 12/27/2018   AV block  07/29/2018   Central sleep apnea 07/29/2018   Cerebrovascular accident ([place]) 07/29/2018   Primary localized osteoarthrosis, shoulder region 01/13/2012     THERAPY DIAG:  Acute pain of right knee  Muscle weakness (generalized)  Stiffness of right knee, not elsewhere classified  Localized edema  Other abnormalities of gait and mobility  Unsteadiness on feet   PCP: Sueanne Margarita, DO  REFERRING PROVIDER: Jean Rosenthal, MD  REFERRING DIAG:  M17.11 (ICD-10-CM) - Unilateral primary osteoarthritis, right knee  Z96.651 (ICD-10-CM) - Status post total right knee replacement    THERAPY DIAG:  Acute pain of right knee  Muscle weakness (generalized)  Stiffness of right knee, not elsewhere classified  Localized edema  Other abnormalities of gait and mobility  Unsteadiness on feet  Rationale for Evaluation and Treatment: Rehabilitation  ONSET DATE:  05/31/2022 Right TKA  SUBJECTIVE:   SUBJECTIVE STATEMENT: Pt arriving today reporting next to no pain. Pt stating compliance in his HEP.   PERTINENT HISTORY: OA, Right shoulder hemiarthroplasty, AV block, CVA 01/2018, HTN  PAIN:  Are you having pain? Yes: NPRS scale: today 2/10 and in last week 1/10 - 4/10 Pain location: right knee anterior and lateral Pain description: sore  Aggravating factors: twisting / turning Relieving factors: sit down, massage, meds, ice  PRECAUTIONS: None  WEIGHT BEARING  RESTRICTIONS: No  FALLS:  Has patient fallen in last 6 months? No    LIVING ENVIRONMENT: Lives with: lives with their spouse Lives in: [place] 2-story half bath downstairs, bedrooms & bathrooms are upstairs. Stairs: Yes: Internal: 13 steps; on right going up and External: 1 steps; none Has following equipment at home: Single point cane, Walker - 2 wheeled, shower chair, and Grab bars  OCCUPATION: retired   PLOF: Independent, [place] with household mobility without device, and Independent with community mobility  without device  PATIENT GOALS:   function without pain, improve walking, yard work  NEXT MD VISIT: 07/13/2022  OBJECTIVE: (all measures taken from initial evaluation unless otherwise dated)  DIAGNOSTIC FINDINGS: 05/31/2022 post-op X-ray Evidence of patient's recent right total knee replacement with prosthetic components intact and normally located. Mild air and fluid within the knee joint. Skin staples over the anterior soft tissues vertically just left of midline.  PATIENT SURVEYS:  FOTO intake: 47%   predicted:  55%  COGNITION: Overall cognitive status: WFL    SENSATION: WFL  EDEMA:  RLE: above knee 49.8cm  around knee 48.9cm below knee  42.8cm LLE:  above knee 41.8cm  around knee 44cm  below knee  35cm  POSTURE: rounded shoulders, forward head, flexed trunk , and weight shift left  PALPATION: Tenderness along incision, patella borders, joint line, distal quads and proximal gastroc.   LOWER EXTREMITY ROM:   ROM Right eval Right 06/22/22 Right 06/29/22 Right 07/01/22  Knee flexion Seated A: 83* P: 86* Supine A: 105 P: 112 Seated P: 115* A: 107* Seated A: 110 P: 118  Knee extension Supine: P: -11* A: quad set -14* Seated A: LAQ -34* A: Seated LAQ  -14 P: Supine heel prop -8 Seated  A: LAQ -12* Standing A: -8* Seated A: -12 P: -10   (Blank rows = not tested)  LOWER EXTREMITY MMT:  MMT Right eval Left eval  Knee flexion 3-/5   Knee extension 3-/5    (Blank rows = not tested)  FUNCTIONAL TESTS:  18 inch chair transfer: requires use of UEs on armrests and RW to stabilize  GAIT: Distance walked: 100' Assistive device utilized: Environmental consultant - 2 wheeled Level of assistance: SBA Comments: antalgic pattern with decreased RLE stance, right knee flexed in stance, significantly limited in knee flexion for swing - walks with stiff LE,    TODAY'S TREATMENT [facility] Adult PT Treatment:                                                 DATE: 07/01/2022 Therapeutic  Exercise: Recumbent bike: seat at 8, Level 3 x 8 minutes Gastroc stretch step heel depression 30 sec hold 2 reps  LE Leg Press BLEs 106# 2 x 10 ,  RLE only 50# 2 x 10  Step ups on 6 inch step x 15 c single UE support Seated LAQ 3 # x 15 holding 2-3 seconds Supine SLR: 2 x 10 on Rt Supine heel slides x 5  Manual Therapy: Supine PROM flexion and extension with overpressure Modalities Vasopneumatic: 34 deg, medium compression x 10 minutes on Rt knee       DATE: 06/29/2022 Therapeutic Exercise: SciFit bike seat 12 Level 1 with BLEs / BUEs 5 min & BLEs only 3 min Gastroc stretch step heel depression 30 sec hold 2 reps ea LE Leg Press  BLEs 100# 15 reps; RLE only 50# 10 reps 2 sets Hamstring stretch supine SLR with PT manual knee ext 30 sec 2 reps. Reviewed seated with foot on floor SLR Quad stretch supine LE over edge knee flexion with strap 30 sec hold 2 reps  Self-Care: PT demo & verbal cues on scar mobs. Pt verbalized understanding.   Manual Therapy: Supine passive physiological movement R knee joint flex/ext to pt tolerance, gentle oscillations to mitigate muscle guarding, gentle STM quads/hamstring  Vaso Rt knee supine elevation 34* medium compression 10 mins   DATE: 06/24/22 Therapeutic Exercise: Nu step 7mn LE/UE during subjective SAQ w/ bolster under knee 2x15, tactile/verbal cues as needed  at quad, difficulty achieving full knee extension TKE with heel prop, supine, 2x10 cues for form, 2-3sec hold, tactile cues for quad Supine heel slides x20, strap and towel, cues for form and breath control  Manual Therapy: Supine passive physiological movement R knee joint flex/ext to pt tolerance, gentle oscillations to mitigate muscle guarding, gentle STM quads/hamstring   DATE:  06/22/2022 TherEx NuStep L6 x 8 min; 4 extremities; seat 12 Seated LAQ 2x10; 3 sec hold, Rt with 3# AROM measurements - see above Verbal review of HEP and discussed breaking exercises up throughout  the day, doing 2-3 exercises every hour for 5-10 reps, ice PRN Supine AA heel slide x 20 reps Supine SAQ 3# on Rt 2x10; 3 sec hold    PATIENT EDUCATION:  Education details: HEP review, POC, rationale for interventions Person educated: Patient Education method: EConsulting civil engineer Demonstration, Verbal cues, and Handouts Education comprehension: verbalized understanding, returned demonstration, and verbal cues required  HOME EXERCISE PROGRAM: Access Code: [email or web address]: [email or web address] Date: 06/14/2022 Prepared by: RJamey Reas Exercises - Ankle Alphabet in Elevation  - 2-4 x daily - 7 x weekly - 1 sets - 1 reps - Quad Setting and Stretching  - 2-4 x daily - 7 x weekly - 5-10 sets - 10 reps - prop 5-10 minutes & quad set5 seconds hold - Supine Leg Press  - 2-4 x daily - 7 x weekly - 5-10 sets - 10 reps - 5 seconds hold - Supine Heel Slide with Strap  - 2-3 x daily - 7 x weekly - 2-3 sets - 10 reps - 5 seconds hold - Supine Knee Extension Strengthening  - 2-3 x daily - 7 x weekly - 2-3 sets - 10 reps - 5 seconds hold - Supine Straight Leg Raises  - 2-3 x daily - 7 x weekly - 2-3 sets - 10 reps - 5 seconds hold - Seated Long Arc Quad with Strap  - 2-3 x daily - 7 x weekly - 2-3 sets - 5-10 reps - 5 seconds hold - Seated Knee Flexion Extension AROM   - 2-4 x daily - 7 x weekly - 2-3 sets - 10 reps - 5 seconds hold - Seated straight leg lifts  - 2-3 x daily - 7 x weekly - 2-3 sets - 10 reps - 5 seconds hold - Seated Hamstring Stretch with Strap  - 2-4 x daily - 7 x weekly - 1 sets - 3 reps - 20-30 seconds hold  ASSESSMENT:  CLINICAL IMPRESSION: Pt with continued improvements in Active ROM in his Rt knee. Pt tolerating exercises well today working on more functional movements in standing. Pt still showing main deficits in Rt knee extension. Pt was instructed to focus on quad sets at home and heel propping to help with ROM. Continue with skilled PT to  maximize pt's function.    OBJECTIVE IMPAIRMENTS: Abnormal gait, decreased activity tolerance, decreased balance, decreased endurance, decreased knowledge of condition, decreased knowledge of use of DME, decreased mobility, difficulty walking, decreased ROM, decreased strength, increased edema, increased muscle spasms, postural dysfunction, and pain.   ACTIVITY LIMITATIONS: carrying, lifting, bending, sitting, standing, squatting, sleeping, stairs, transfers, and locomotion level  PARTICIPATION LIMITATIONS: meal prep, cleaning, laundry, driving, community activity, and yard work  PERSONAL FACTORS: Fitness and 3+ comorbidities: see PMH are also affecting patient's functional outcome.   REHAB POTENTIAL: Good  CLINICAL DECISION MAKING: Stable/uncomplicated  EVALUATION COMPLEXITY: Low   GOALS: Goals reviewed with patient? Yes  SHORT TERM GOALS: (target date for Short term goals 07/15/2022)   1.  Patient will demonstrate independent use of home exercise program to maintain progress from in clinic treatments.  Goal status: Met 07/01/22  2. Right knee PROM -5* ext to 95* flexion  Goal Status: On-going 07/01/22   LONG TERM GOALS: (target dates for all long term goals are 10 weeks  08/26/2022 )   1. Patient will demonstrate/report pain at worst less than or equal to 2/10 to facilitate minimal limitation in daily activity secondary to pain symptoms.  Goal status: New   2. Patient will demonstrate independent use of home exercise program to facilitate ability to maintain/progress functional gains from skilled physical therapy services.  Goal status: New   3. Patient will demonstrate FOTO outcome > or = 55 % to indicate reduced disability due to condition.  Goal status: New   4.  Patient will demonstrate right knee LE MMT 5/5 throughout to faciltiate usual transfers, stairs, squatting at [facility] for daily life.   Goal status: New   5.  Patient right knee AROM 0* ext to 100* flexion. Goal status: New   6.  pt  ambulates >500', negotiates ramps & curbs without device and stairs single rail modified independent.  Goal status: New   7.  pt demonstrates activities to enable to return to yard work as pt goal.  Goal Status: New   PLAN:  PT FREQUENCY: 2-3x/week  PT DURATION: 10 weeks  PLANNED INTERVENTIONS: Therapeutic exercises, Therapeutic activity, Neuromuscular re-education, Balance training, Gait training, Patient/Family education, Self Care, Joint mobilization, Stair training, Vestibular training, DME instructions, Electrical stimulation, Cryotherapy, Moist heat, scar mobilization, Taping, Vasopneumatic device, Ultrasound, Manual therapy, and Re-evaluation  PLAN FOR NEXT SESSION:  update HEP to include stretches, manual therapy & therapeutic exercise working on range & quad strength, Closed chain exercises, begin standing balance activities, vaso to end PRN    Oretha Caprice, PT, MPT 07/01/2022, 10:59 AM

## 2022-07-04 ENCOUNTER — Encounter: Payer: Self-pay | Admitting: Physical Therapy

## 2022-07-04 ENCOUNTER — Ambulatory Visit: Payer: PPO | Admitting: Physical Therapy

## 2022-07-04 DIAGNOSIS — M25661 Stiffness of right knee, not elsewhere classified: Secondary | ICD-10-CM

## 2022-07-04 DIAGNOSIS — M6281 Muscle weakness (generalized): Secondary | ICD-10-CM

## 2022-07-04 DIAGNOSIS — M25561 Pain in right knee: Secondary | ICD-10-CM | POA: Diagnosis not present

## 2022-07-04 DIAGNOSIS — R600 Localized edema: Secondary | ICD-10-CM

## 2022-07-04 DIAGNOSIS — R2681 Unsteadiness on feet: Secondary | ICD-10-CM

## 2022-07-04 DIAGNOSIS — R2689 Other abnormalities of gait and mobility: Secondary | ICD-10-CM | POA: Diagnosis not present

## 2022-07-04 NOTE — Therapy (Signed)
OUTPATIENT PHYSICAL THERAPY TREATMENT NOTE   Patient Name: Bryan Hart MRN: [id number] DOB:04/22/1951, 72 y.o., male Today's Date: 07/04/2022  END OF SESSION:   PT End of Session - 07/04/22 0933     Visit Number 6    Number of Visits 25    Date for PT Re-Evaluation 08/26/22    Authorization Type [employer]    Authorization Time Period $25 co-pay    Progress Note Due on Visit 10    PT Start Time 0930    PT Stop Time 1029    PT Time Calculation (min) 59 min    Activity Tolerance Patient tolerated treatment well;No increased pain    Behavior During Therapy [facility] for tasks assessed/performed                Past Medical History:  Diagnosis Date   Arthritis    HLD (hyperlipidemia)    Hypertension    dr  ed green      stress test   12 yrs ago   PVC's (premature ventricular contractions) 12 years ago   stress test done   Sleep apnea    Stroke [facility])    minor stroke 01/2018   Past Surgical History:  Procedure Laterality Date   ANKLE ARTHROSCOPY  06/27/2008   JOINT REPLACEMENT  06/27/2009   rt shoulder rotator cuff repair   knee surgeries  [id number]   x2   SHOULDER HEMI-ARTHROPLASTY  01/12/2012   Procedure: SHOULDER HEMI-ARTHROPLASTY;  Surgeon: Nita Sells, MD;  Location: [place];  Service: Orthopedics;  Laterality: Right;   SHOULDER SURGERY Right 07/2021   TOTAL KNEE ARTHROPLASTY Right 05/31/2022   Procedure: RIGHT TOTAL KNEE ARTHROPLASTY;  Surgeon: Mcarthur Rossetti, MD;  Location: [place];  Service: Orthopedics;  Laterality: Right;   Patient Active Problem List   Diagnosis Date Noted   Status post total right knee replacement 05/31/2022   Unilateral primary osteoarthritis, left knee 03/28/2022   Unilateral primary osteoarthritis, right knee 03/28/2022   Primary osteoarthritis of left knee 04/30/2020   Effusion, right knee 03/31/2020   Degenerative arthritis of right knee 03/31/2020   Bilateral primary osteoarthritis of knee 12/27/2018   AV block  07/29/2018   Central sleep apnea 07/29/2018   Cerebrovascular accident ([place]) 07/29/2018   Primary localized osteoarthrosis, shoulder region 01/13/2012     THERAPY DIAG:  Acute pain of right knee  Muscle weakness (generalized)  Stiffness of right knee, not elsewhere classified  Localized edema  Other abnormalities of gait and mobility  Unsteadiness on feet   PCP: Sueanne Margarita, DO  REFERRING PROVIDER: Jean Rosenthal, MD  REFERRING DIAG:  M17.11 (ICD-10-CM) - Unilateral primary osteoarthritis, right knee  Z96.651 (ICD-10-CM) - Status post total right knee replacement    THERAPY DIAG:  Acute pain of right knee  Muscle weakness (generalized)  Stiffness of right knee, not elsewhere classified  Localized edema  Other abnormalities of gait and mobility  Unsteadiness on feet  Rationale for Evaluation and Treatment: Rehabilitation  ONSET DATE:  05/31/2022 Right TKA  SUBJECTIVE:   SUBJECTIVE STATEMENT: He twisted legs in bed Friday night. His pain was higher Saturday, then less Sunday.  This morning (Monday) only a slight remembrance.   PERTINENT HISTORY: OA, Right shoulder hemiarthroplasty, AV block, CVA 01/2018, HTN  PAIN:  Are you having pain? Yes: NPRS scale: today 1-2/10 and in last week 1/10 - 7/10 Pain location: right knee anterior and lateral Pain description: sore  Aggravating factors: twisting / turning Relieving factors: sit down,  massage, meds, ice  PRECAUTIONS: None  WEIGHT BEARING RESTRICTIONS: No  FALLS:  Has patient fallen in last 6 months? No    LIVING ENVIRONMENT: Lives with: lives with their spouse Lives in: [place] 2-story half bath downstairs, bedrooms & bathrooms are upstairs. Stairs: Yes: Internal: 13 steps; on right going up and External: 1 steps; none Has following equipment at home: Single point cane, Walker - 2 wheeled, shower chair, and Grab bars  OCCUPATION: retired   PLOF: Independent, [place] with household  mobility without device, and Independent with community mobility without device  PATIENT GOALS:   function without pain, improve walking, yard work  NEXT MD VISIT: 07/13/2022  OBJECTIVE: (all measures taken from initial evaluation unless otherwise dated)  DIAGNOSTIC FINDINGS: 05/31/2022 post-op X-ray Evidence of patient's recent right total knee replacement with prosthetic components intact and normally located. Mild air and fluid within the knee joint. Skin staples over the anterior soft tissues vertically just left of midline.  PATIENT SURVEYS:  FOTO intake: 47%   predicted:  55%  COGNITION: Overall cognitive status: WFL    SENSATION: WFL  EDEMA:  RLE: above knee 49.8cm  around knee 48.9cm below knee  42.8cm LLE:  above knee 41.8cm  around knee 44cm  below knee  35cm  POSTURE: rounded shoulders, forward head, flexed trunk , and weight shift left  PALPATION: Tenderness along incision, patella borders, joint line, distal quads and proximal gastroc.   LOWER EXTREMITY ROM:   ROM Right eval Right 06/22/22 Right 06/29/22 Right 07/01/22  Knee flexion Seated A: 83* P: 86* Supine A: 105 P: 112 Seated P: 115* A: 107* Seated A: 110 P: 118  Knee extension Supine: P: -11* A: quad set -14* Seated A: LAQ -34* A: Seated LAQ  -14 P: Supine heel prop -8 Seated  A: LAQ -12* Standing A: -8* Seated A: -12 P: -10   (Blank rows = not tested)  LOWER EXTREMITY MMT:  MMT Right eval Left eval  Knee flexion 3-/5   Knee extension 3-/5    (Blank rows = not tested)  FUNCTIONAL TESTS:  18 inch chair transfer: requires use of UEs on armrests and RW to stabilize  GAIT: Distance walked: 100' Assistive device utilized: Environmental consultant - 2 wheeled Level of assistance: SBA Comments: antalgic pattern with decreased RLE stance, right knee flexed in stance, significantly limited in knee flexion for swing - walks with stiff LE,    TODAY'S TREATMENT [facility] Adult PT Treatment:                                                  DATE:  07/04/2022: Therapeutic Exercise: Sci Fit Recumbent bike: LEs only seat at 12, Level 4 x 8 minutes Leg Press 5 sec hold flex & ext - BLEs 118# 10 reps 1 set back 45* & 1 set back flat,  RLE only 56 # 2 x 10  Hamstring stretch SLR strap with PT manual assist for ext 30 sec hold 3 reps Gastroc stretch step heel depression 30 sec hold 2 reps RLE Step ups (concentric RLE) & down (eccentric RLE) on 6 inch step x 10 reps c single UE support Squats holding sink 10 reps and no UE support picking up items from floor 5 reps.  PT demo & verbal cues on technique. Seated LAQ 5 # & active knee flexion with contralateral  LE opposing motion x 15 holding 2-3 seconds   Therapeutic Activities PT recommended using pillow to "tent" sheets off feet and pillow between LEs in sidelying. Pt verbalized understanding.  Slider to 3 cones (ant-lat, lat & post-lat) stance knee flexion on out & extension on in motion 5 reps ea LE with single UE support on sink  Modalities Vasopneumatic: 34 deg, medium compression x 10 minutes on Rt knee with elevation  07/01/2022 Therapeutic Exercise: Recumbent bike: seat at 8, Level 3 x 8 minutes Gastroc stretch step heel depression 30 sec hold 2 reps  LE Leg Press BLEs 106# 2 x 10 ,  RLE only 50# 2 x 10  Step ups on 6 inch step x 15 c single UE support Seated LAQ 3 # x 15 holding 2-3 seconds Supine SLR: 2 x 10 on Rt Supine heel slides x 5  Manual Therapy: Supine PROM flexion and extension with overpressure Modalities Vasopneumatic: 34 deg, medium compression x 10 minutes on Rt knee   06/29/2022 Therapeutic Exercise: SciFit bike seat 12 Level 1 with BLEs / BUEs 5 min & BLEs only 3 min Gastroc stretch step heel depression 30 sec hold 2 reps ea LE Leg Press BLEs 100# 15 reps; RLE only 50# 10 reps 2 sets Hamstring stretch supine SLR with PT manual knee ext 30 sec 2 reps. Reviewed seated with foot on floor SLR Quad stretch supine LE over edge knee  flexion with strap 30 sec hold 2 reps  Self-Care: PT demo & verbal cues on scar mobs. Pt verbalized understanding.   Manual Therapy: Supine passive physiological movement R knee joint flex/ext to pt tolerance, gentle oscillations to mitigate muscle guarding, gentle STM quads/hamstring  Vaso Rt knee supine elevation 34* medium compression 10 mins   PATIENT EDUCATION:  Education details: HEP review, POC, rationale for interventions Person educated: Patient Education method: Consulting civil engineer, Demonstration, Verbal cues, and Handouts Education comprehension: verbalized understanding, returned demonstration, and verbal cues required  HOME EXERCISE PROGRAM: Access Code: [email or web address] URL: [email or web address] Date: 07/04/2022 Prepared by: Jamey Reas  Exercises - Ankle Alphabet in Elevation  - 2-4 x daily - 7 x weekly - 1 sets - 1 reps - Quad Setting and Stretching  - 2-4 x daily - 7 x weekly - 5-10 sets - 10 reps - prop 5-10 minutes & quad set5 seconds hold - Supine Heel Slide with Strap  - 2-3 x daily - 7 x weekly - 2-3 sets - 10 reps - 5 seconds hold - Supine Straight Leg Raises  - 2-3 x daily - 7 x weekly - 2-3 sets - 10 reps - 5 seconds hold - Hooklying Hamstring Stretch with Strap  - 2-4 x daily - 7 x weekly - 1 sets - 3 reps - 20-30 seconds hold - Supine Quadriceps Stretch with Strap on Table  - 2-4 x daily - 7 x weekly - 1 sets - 3 reps - 20-30 seconds hold - Seated Knee Flexion Extension AROM   - 2-4 x daily - 7 x weekly - 2-3 sets - 10 reps - 5 seconds hold - Seated straight leg lifts  - 2-3 x daily - 7 x weekly - 2-3 sets - 10 reps - 5 seconds hold - Seated Hamstring Stretch with Strap  - 2-4 x daily - 7 x weekly - 1 sets - 3 reps - 20-30 seconds hold - Seated Long Arc Quad  - 2 x daily - 7 x weekly - 2-3 sets - 10 reps - 5  seconds hold - squat at sink with chair behind you  - 2 x daily - 7 x weekly - 2-3 sets - 10 reps - 5 seconds hold - Gastroc Stretch on Step  - 2-4  x daily - 7 x weekly - 1 sets - 3 reps - 20-30 seconds hold - Seated Hamstring Stretch with Strap  - 2-4 x daily - 7 x weekly - 1 sets - 3 reps - 20-30 seconds hold  ASSESSMENT:  CLINICAL IMPRESSION: PT progressed activities / exercises with functional component.  He improved each motion but Rt knee was fatigued by end of session.  PT updated HEP to include stretches which appears to understand. Pt continues to benefit from skilled PT.   OBJECTIVE IMPAIRMENTS: Abnormal gait, decreased activity tolerance, decreased balance, decreased endurance, decreased knowledge of condition, decreased knowledge of use of DME, decreased mobility, difficulty walking, decreased ROM, decreased strength, increased edema, increased muscle spasms, postural dysfunction, and pain.   ACTIVITY LIMITATIONS: carrying, lifting, bending, sitting, standing, squatting, sleeping, stairs, transfers, and locomotion level  PARTICIPATION LIMITATIONS: meal prep, cleaning, laundry, driving, community activity, and yard work  PERSONAL FACTORS: Fitness and 3+ comorbidities: see PMH are also affecting patient's functional outcome.   REHAB POTENTIAL: Good  CLINICAL DECISION MAKING: Stable/uncomplicated  EVALUATION COMPLEXITY: Low   GOALS: Goals reviewed with patient? Yes  SHORT TERM GOALS: (target date for Short term goals 07/15/2022)   1.  Patient will demonstrate independent use of home exercise program to maintain progress from in clinic treatments.  Goal status: Met 07/01/22  2. Right knee PROM -5* ext to 95* flexion  Goal Status: On-going 07/01/22   LONG TERM GOALS: (target dates for all long term goals are 10 weeks  08/26/2022 )   1. Patient will demonstrate/report pain at worst less than or equal to 2/10 to facilitate minimal limitation in daily activity secondary to pain symptoms.  Goal status: New   2. Patient will demonstrate independent use of home exercise program to facilitate ability to maintain/progress  functional gains from skilled physical therapy services.  Goal status: New   3. Patient will demonstrate FOTO outcome > or = 55 % to indicate reduced disability due to condition.  Goal status: New   4.  Patient will demonstrate right knee LE MMT 5/5 throughout to faciltiate usual transfers, stairs, squatting at [facility] for daily life.   Goal status: New   5.  Patient right knee AROM 0* ext to 100* flexion. Goal status: New   6.  pt ambulates >500', negotiates ramps & curbs without device and stairs single rail modified independent.  Goal status: New   7.  pt demonstrates activities to enable to return to yard work as pt goal.  Goal Status: New   PLAN:  PT FREQUENCY: 2-3x/week  PT DURATION: 10 weeks  PLANNED INTERVENTIONS: Therapeutic exercises, Therapeutic activity, Neuromuscular re-education, Balance training, Gait training, Patient/Family education, Self Care, Joint mobilization, Stair training, Vestibular training, DME instructions, Electrical stimulation, Cryotherapy, Moist heat, scar mobilization, Taping, Vasopneumatic device, Ultrasound, Manual therapy, and Re-evaluation  PLAN FOR NEXT SESSION:  measure PROM for STGs,  manual therapy & therapeutic exercise working on range & quad strength, Closed chain functional exercises, vaso to end PRN    Jamey Reas, PT, DPT 07/04/2022, 10:35 AM

## 2022-07-05 DIAGNOSIS — Z1211 Encounter for screening for malignant neoplasm of colon: Secondary | ICD-10-CM | POA: Diagnosis not present

## 2022-07-05 DIAGNOSIS — R82998 Other abnormal findings in urine: Secondary | ICD-10-CM | POA: Diagnosis not present

## 2022-07-05 DIAGNOSIS — Z0000 Encounter for general adult medical examination without abnormal findings: Secondary | ICD-10-CM | POA: Diagnosis not present

## 2022-07-05 DIAGNOSIS — Z1339 Encounter for screening examination for other mental health and behavioral disorders: Secondary | ICD-10-CM | POA: Diagnosis not present

## 2022-07-05 DIAGNOSIS — I10 Essential (primary) hypertension: Secondary | ICD-10-CM | POA: Diagnosis not present

## 2022-07-05 DIAGNOSIS — M170 Bilateral primary osteoarthritis of knee: Secondary | ICD-10-CM | POA: Diagnosis not present

## 2022-07-05 DIAGNOSIS — Z1331 Encounter for screening for depression: Secondary | ICD-10-CM | POA: Diagnosis not present

## 2022-07-05 DIAGNOSIS — D510 Vitamin B12 deficiency anemia due to intrinsic factor deficiency: Secondary | ICD-10-CM | POA: Diagnosis not present

## 2022-07-05 DIAGNOSIS — G4733 Obstructive sleep apnea (adult) (pediatric): Secondary | ICD-10-CM | POA: Diagnosis not present

## 2022-07-05 DIAGNOSIS — R5383 Other fatigue: Secondary | ICD-10-CM | POA: Diagnosis not present

## 2022-07-05 DIAGNOSIS — R251 Tremor, unspecified: Secondary | ICD-10-CM | POA: Diagnosis not present

## 2022-07-05 DIAGNOSIS — I493 Ventricular premature depolarization: Secondary | ICD-10-CM | POA: Diagnosis not present

## 2022-07-05 DIAGNOSIS — E785 Hyperlipidemia, unspecified: Secondary | ICD-10-CM | POA: Diagnosis not present

## 2022-07-05 DIAGNOSIS — Z8673 Personal history of transient ischemic attack (TIA), and cerebral infarction without residual deficits: Secondary | ICD-10-CM | POA: Diagnosis not present

## 2022-07-05 DIAGNOSIS — F325 Major depressive disorder, single episode, in full remission: Secondary | ICD-10-CM | POA: Diagnosis not present

## 2022-07-05 DIAGNOSIS — R413 Other amnesia: Secondary | ICD-10-CM | POA: Diagnosis not present

## 2022-07-06 ENCOUNTER — Encounter: Payer: Self-pay | Admitting: Physical Therapy

## 2022-07-06 ENCOUNTER — Ambulatory Visit: Payer: PPO | Admitting: Physical Therapy

## 2022-07-06 DIAGNOSIS — R2689 Other abnormalities of gait and mobility: Secondary | ICD-10-CM

## 2022-07-06 DIAGNOSIS — M25661 Stiffness of right knee, not elsewhere classified: Secondary | ICD-10-CM

## 2022-07-06 DIAGNOSIS — M6281 Muscle weakness (generalized): Secondary | ICD-10-CM | POA: Diagnosis not present

## 2022-07-06 DIAGNOSIS — R2681 Unsteadiness on feet: Secondary | ICD-10-CM | POA: Diagnosis not present

## 2022-07-06 DIAGNOSIS — R600 Localized edema: Secondary | ICD-10-CM

## 2022-07-06 DIAGNOSIS — M25561 Pain in right knee: Secondary | ICD-10-CM

## 2022-07-06 NOTE — Therapy (Signed)
OUTPATIENT PHYSICAL THERAPY TREATMENT NOTE   Patient Name: Bryan Hart MRN: [id number] DOB:Jan 20, 1951, 72 y.o., male Today's Date: 07/06/2022  END OF SESSION:   PT End of Session - 07/06/22 1010     Visit Number 7    Number of Visits 25    Date for PT Re-Evaluation 08/26/22    Authorization Type [employer]    Authorization Time Period $25 co-pay    Progress Note Due on Visit 10    PT Start Time 1015    PT Stop Time 1105    PT Time Calculation (min) 50 min    Activity Tolerance Patient tolerated treatment well;No increased pain    Behavior During Therapy [facility] for tasks assessed/performed                 Past Medical History:  Diagnosis Date   Arthritis    HLD (hyperlipidemia)    Hypertension    dr  ed green      stress test   12 yrs ago   PVC's (premature ventricular contractions) 12 years ago   stress test done   Sleep apnea    Stroke [facility])    minor stroke 01/2018   Past Surgical History:  Procedure Laterality Date   ANKLE ARTHROSCOPY  06/27/2008   JOINT REPLACEMENT  06/27/2009   rt shoulder rotator cuff repair   knee surgeries  [id number]   x2   SHOULDER HEMI-ARTHROPLASTY  01/12/2012   Procedure: SHOULDER HEMI-ARTHROPLASTY;  Surgeon: Nita Sells, MD;  Location: [place];  Service: Orthopedics;  Laterality: Right;   SHOULDER SURGERY Right 07/2021   TOTAL KNEE ARTHROPLASTY Right 05/31/2022   Procedure: RIGHT TOTAL KNEE ARTHROPLASTY;  Surgeon: Mcarthur Rossetti, MD;  Location: [place];  Service: Orthopedics;  Laterality: Right;   Patient Active Problem List   Diagnosis Date Noted   Status post total right knee replacement 05/31/2022   Unilateral primary osteoarthritis, left knee 03/28/2022   Unilateral primary osteoarthritis, right knee 03/28/2022   Primary osteoarthritis of left knee 04/30/2020   Effusion, right knee 03/31/2020   Degenerative arthritis of right knee 03/31/2020   Bilateral primary osteoarthritis of knee 12/27/2018   AV block  07/29/2018   Central sleep apnea 07/29/2018   Cerebrovascular accident ([place]) 07/29/2018   Primary localized osteoarthrosis, shoulder region 01/13/2012     THERAPY DIAG:  Acute pain of right knee  Muscle weakness (generalized)  Stiffness of right knee, not elsewhere classified  Localized edema  Other abnormalities of gait and mobility  Unsteadiness on feet   PCP: Sueanne Margarita, DO  REFERRING PROVIDER: Jean Rosenthal, MD  REFERRING DIAG:  M17.11 (ICD-10-CM) - Unilateral primary osteoarthritis, right knee  Z96.651 (ICD-10-CM) - Status post total right knee replacement    THERAPY DIAG:  Acute pain of right knee  Muscle weakness (generalized)  Stiffness of right knee, not elsewhere classified  Localized edema  Other abnormalities of gait and mobility  Unsteadiness on feet  Rationale for Evaluation and Treatment: Rehabilitation  ONSET DATE:  05/31/2022 Right TKA  SUBJECTIVE:   SUBJECTIVE STATEMENT: Knee is still swollen but feels like it's getting better.  Still having some mild lightheadedness  PERTINENT HISTORY: OA, Right shoulder hemiarthroplasty, AV block, CVA 01/2018, HTN  PAIN:  Are you having pain? Yes: NPRS scale: today 1-2/10 and in last week 1/10 - 7/10 Pain location: right knee anterior and lateral Pain description: sore  Aggravating factors: twisting / turning Relieving factors: sit down, massage, meds, ice  PRECAUTIONS: None  WEIGHT BEARING RESTRICTIONS: No  FALLS:  Has patient fallen in last 6 months? No    LIVING ENVIRONMENT: Lives with: lives with their spouse Lives in: [place] 2-story half bath downstairs, bedrooms & bathrooms are upstairs. Stairs: Yes: Internal: 13 steps; on right going up and External: 1 steps; none Has following equipment at home: Single point cane, Walker - 2 wheeled, shower chair, and Grab bars  OCCUPATION: retired   PLOF: Independent, [place] with household mobility without device, and Independent  with community mobility without device  PATIENT GOALS:   function without pain, improve walking, yard work  NEXT MD VISIT: 07/13/2022  OBJECTIVE: (all measures taken from initial evaluation unless otherwise dated)  DIAGNOSTIC FINDINGS: 05/31/2022 post-op X-ray Evidence of patient's recent right total knee replacement with prosthetic components intact and normally located. Mild air and fluid within the knee joint. Skin staples over the anterior soft tissues vertically just left of midline.  PATIENT SURVEYS:  FOTO intake: 47%   predicted:  55%  COGNITION: Overall cognitive status: WFL    SENSATION: WFL  EDEMA:  RLE: above knee 49.8cm  around knee 48.9cm below knee  42.8cm LLE:  above knee 41.8cm  around knee 44cm  below knee  35cm  POSTURE: rounded shoulders, forward head, flexed trunk , and weight shift left  PALPATION: Tenderness along incision, patella borders, joint line, distal quads and proximal gastroc.   LOWER EXTREMITY ROM:   ROM Right eval Right 06/22/22 Right 06/29/22 Right 07/01/22 Right 07/06/22  Knee flexion Seated A: 83* P: 86* Supine A: 105 P: 112 Seated P: 115* A: 107* Seated A: 110 P: 118 Supine:  A: 113  Knee extension Supine: P: -11* A: quad set -14* Seated A: LAQ -34* A: Seated LAQ  -14 P: Supine heel prop -8 Seated  A: LAQ -12* Standing A: -8* Seated A: -12 P: -10 Seated A: -12    (Blank rows = not tested)  LOWER EXTREMITY MMT:  MMT Right eval Left eval  Knee flexion 3-/5   Knee extension 3-/5    (Blank rows = not tested)  FUNCTIONAL TESTS:  18 inch chair transfer: requires use of UEs on armrests and RW to stabilize  GAIT: Distance walked: 100' Assistive device utilized: Environmental consultant - 2 wheeled Level of assistance: SBA Comments: antalgic pattern with decreased RLE stance, right knee flexed in stance, significantly limited in knee flexion for swing - walks with stiff LE,    TODAY'S TREATMENT [facility] Adult PT Treatment: DATE:   07/06/22 TherEx Recumbent bike x 8 min; seat 8 Leg Press 100# bil 3x10; then RLE only 50# 3x10 Calf Raises 3x10; light UE support AA LAQ x 3 reps with Lt foot providing assist, working on eccentric control with RLE AA heel slides x 5 reps; 5 sec hold AROM measurements as noted above  Modalities Vasopneumatic: 34 deg, medium compression x 10 minutes on Rt knee with elevation  07/04/2022: Therapeutic Exercise: Sci Fit Recumbent bike: LEs only seat at 12, Level 4 x 8 minutes Leg Press 5 sec hold flex & ext - BLEs 118# 10 reps 1 set back 45* & 1 set back flat,  RLE only 56 # 2 x 10  Hamstring stretch SLR strap with PT manual assist for ext 30 sec hold 3 reps Gastroc stretch step heel depression 30 sec hold 2 reps RLE Step ups (concentric RLE) & down (eccentric RLE) on 6 inch step x 10 reps c single UE support Squats holding sink 10 reps and no UE  support picking up items from floor 5 reps.  PT demo & verbal cues on technique. Seated LAQ 5 # & active knee flexion with contralateral LE opposing motion x 15 holding 2-3 seconds   Therapeutic Activities PT recommended using pillow to "tent" sheets off feet and pillow between LEs in sidelying. Pt verbalized understanding.  Slider to 3 cones (ant-lat, lat & post-lat) stance knee flexion on out & extension on in motion 5 reps ea LE with single UE support on sink  Modalities Vasopneumatic: 34 deg, medium compression x 10 minutes on Rt knee with elevation  07/01/2022 Therapeutic Exercise: Recumbent bike: seat at 8, Level 3 x 8 minutes Gastroc stretch step heel depression 30 sec hold 2 reps  LE Leg Press BLEs 106# 2 x 10 ,  RLE only 50# 2 x 10  Step ups on 6 inch step x 15 c single UE support Seated LAQ 3 # x 15 holding 2-3 seconds Supine SLR: 2 x 10 on Rt Supine heel slides x 5  Manual Therapy: Supine PROM flexion and extension with overpressure Modalities Vasopneumatic: 34 deg, medium compression x 10 minutes on Rt  knee   06/29/2022 Therapeutic Exercise: SciFit bike seat 12 Level 1 with BLEs / BUEs 5 min & BLEs only 3 min Gastroc stretch step heel depression 30 sec hold 2 reps ea LE Leg Press BLEs 100# 15 reps; RLE only 50# 10 reps 2 sets Hamstring stretch supine SLR with PT manual knee ext 30 sec 2 reps. Reviewed seated with foot on floor SLR Quad stretch supine LE over edge knee flexion with strap 30 sec hold 2 reps  Self-Care: PT demo & verbal cues on scar mobs. Pt verbalized understanding.   Manual Therapy: Supine passive physiological movement R knee joint flex/ext to pt tolerance, gentle oscillations to mitigate muscle guarding, gentle STM quads/hamstring  Vaso Rt knee supine elevation 34* medium compression 10 mins   PATIENT EDUCATION:  Education details: HEP review, POC, rationale for interventions Person educated: Patient Education method: Consulting civil engineer, Demonstration, Verbal cues, and Handouts Education comprehension: verbalized understanding, returned demonstration, and verbal cues required  HOME EXERCISE PROGRAM: Access Code: [email or web address] URL: [email or web address] Date: 07/04/2022 Prepared by: Jamey Reas  Exercises - Ankle Alphabet in Elevation  - 2-4 x daily - 7 x weekly - 1 sets - 1 reps - Quad Setting and Stretching  - 2-4 x daily - 7 x weekly - 5-10 sets - 10 reps - prop 5-10 minutes & quad set5 seconds hold - Supine Heel Slide with Strap  - 2-3 x daily - 7 x weekly - 2-3 sets - 10 reps - 5 seconds hold - Supine Straight Leg Raises  - 2-3 x daily - 7 x weekly - 2-3 sets - 10 reps - 5 seconds hold - Hooklying Hamstring Stretch with Strap  - 2-4 x daily - 7 x weekly - 1 sets - 3 reps - 20-30 seconds hold - Supine Quadriceps Stretch with Strap on Table  - 2-4 x daily - 7 x weekly - 1 sets - 3 reps - 20-30 seconds hold - Seated Knee Flexion Extension AROM   - 2-4 x daily - 7 x weekly - 2-3 sets - 10 reps - 5 seconds hold - Seated straight leg lifts  - 2-3 x daily -  7 x weekly - 2-3 sets - 10 reps - 5 seconds hold - Seated Hamstring Stretch with Strap  - 2-4 x daily - 7 x weekly - 1 sets - 3  reps - 20-30 seconds hold - Seated Long Arc Quad  - 2 x daily - 7 x weekly - 2-3 sets - 10 reps - 5 seconds hold - squat at sink with chair behind you  - 2 x daily - 7 x weekly - 2-3 sets - 10 reps - 5 seconds hold - Gastroc Stretch on Step  - 2-4 x daily - 7 x weekly - 1 sets - 3 reps - 20-30 seconds hold - Seated Hamstring Stretch with Strap  - 2-4 x daily - 7 x weekly - 1 sets - 3 reps - 20-30 seconds hold  ASSESSMENT:  CLINICAL IMPRESSION: Pt tolerated session well today, still has some increased swelling affecting mobility and ROM.  Will continue to benefit from PT to maximize function.  OBJECTIVE IMPAIRMENTS: Abnormal gait, decreased activity tolerance, decreased balance, decreased endurance, decreased knowledge of condition, decreased knowledge of use of DME, decreased mobility, difficulty walking, decreased ROM, decreased strength, increased edema, increased muscle spasms, postural dysfunction, and pain.   ACTIVITY LIMITATIONS: carrying, lifting, bending, sitting, standing, squatting, sleeping, stairs, transfers, and locomotion level  PARTICIPATION LIMITATIONS: meal prep, cleaning, laundry, driving, community activity, and yard work  PERSONAL FACTORS: Fitness and 3+ comorbidities: see PMH are also affecting patient's functional outcome.   REHAB POTENTIAL: Good  CLINICAL DECISION MAKING: Stable/uncomplicated  EVALUATION COMPLEXITY: Low   GOALS: Goals reviewed with patient? Yes  SHORT TERM GOALS: (target date for Short term goals 07/15/2022)   1.  Patient will demonstrate independent use of home exercise program to maintain progress from in clinic treatments.  Goal status: Met 07/01/22  2. Right knee PROM -5* ext to 95* flexion  Goal Status: Partially Met 07/06/22  LONG TERM GOALS: (target dates for all long term goals are 10 weeks  08/26/2022 )   1.  Patient will demonstrate/report pain at worst less than or equal to 2/10 to facilitate minimal limitation in daily activity secondary to pain symptoms.  Goal status: New   2. Patient will demonstrate independent use of home exercise program to facilitate ability to maintain/progress functional gains from skilled physical therapy services.  Goal status: New   3. Patient will demonstrate FOTO outcome > or = 55 % to indicate reduced disability due to condition.  Goal status: New   4.  Patient will demonstrate right knee LE MMT 5/5 throughout to faciltiate usual transfers, stairs, squatting at [facility] for daily life.   Goal status: New   5.  Patient right knee AROM 0* ext to 100* flexion. Goal status: New   6.  pt ambulates >500', negotiates ramps & curbs without device and stairs single rail modified independent.  Goal status: New   7.  pt demonstrates activities to enable to return to yard work as pt goal.  Goal Status: New   PLAN:  PT FREQUENCY: 2-3x/week  PT DURATION: 10 weeks  PLANNED INTERVENTIONS: Therapeutic exercises, Therapeutic activity, Neuromuscular re-education, Balance training, Gait training, Patient/Family education, Self Care, Joint mobilization, Stair training, Vestibular training, DME instructions, Electrical stimulation, Cryotherapy, Moist heat, scar mobilization, Taping, Vasopneumatic device, Ultrasound, Manual therapy, and Re-evaluation  PLAN FOR NEXT SESSION:  really focus on quad activation and TKE,  manual therapy & therapeutic exercise working on range & quad strength, Closed chain functional exercises, vaso to end PRN    Faustino Congress, PT, DPT 07/06/2022, 11:07 AM

## 2022-07-08 ENCOUNTER — Encounter: Payer: Self-pay | Admitting: Physical Therapy

## 2022-07-08 ENCOUNTER — Ambulatory Visit: Payer: PPO | Admitting: Physical Therapy

## 2022-07-08 DIAGNOSIS — R2681 Unsteadiness on feet: Secondary | ICD-10-CM

## 2022-07-08 DIAGNOSIS — M25661 Stiffness of right knee, not elsewhere classified: Secondary | ICD-10-CM | POA: Diagnosis not present

## 2022-07-08 DIAGNOSIS — M6281 Muscle weakness (generalized): Secondary | ICD-10-CM | POA: Diagnosis not present

## 2022-07-08 DIAGNOSIS — R600 Localized edema: Secondary | ICD-10-CM | POA: Diagnosis not present

## 2022-07-08 DIAGNOSIS — M25561 Pain in right knee: Secondary | ICD-10-CM | POA: Diagnosis not present

## 2022-07-08 DIAGNOSIS — R2689 Other abnormalities of gait and mobility: Secondary | ICD-10-CM | POA: Diagnosis not present

## 2022-07-08 NOTE — Therapy (Signed)
OUTPATIENT PHYSICAL THERAPY TREATMENT NOTE   Patient Name: Bryan Hart MRN: [id number] DOB:Nov 13, 1950, 72 y.o., male Today's Date: 07/08/2022  END OF SESSION:   PT End of Session - 07/08/22 1009     Visit Number 8    Number of Visits 25    Date for PT Re-Evaluation 08/26/22    Authorization Type [employer]    Authorization Time Period $25 co-pay    Progress Note Due on Visit 10    PT Start Time 1010    PT Stop Time 1105    PT Time Calculation (min) 55 min    Activity Tolerance Patient tolerated treatment well;No increased pain    Behavior During Therapy [facility] for tasks assessed/performed                  Past Medical History:  Diagnosis Date   Arthritis    HLD (hyperlipidemia)    Hypertension    dr  ed green      stress test   12 yrs ago   PVC's (premature ventricular contractions) 12 years ago   stress test done   Sleep apnea    Stroke [facility])    minor stroke 01/2018   Past Surgical History:  Procedure Laterality Date   ANKLE ARTHROSCOPY  06/27/2008   JOINT REPLACEMENT  06/27/2009   rt shoulder rotator cuff repair   knee surgeries  [id number]   x2   SHOULDER HEMI-ARTHROPLASTY  01/12/2012   Procedure: SHOULDER HEMI-ARTHROPLASTY;  Surgeon: Nita Sells, MD;  Location: [place];  Service: Orthopedics;  Laterality: Right;   SHOULDER SURGERY Right 07/2021   TOTAL KNEE ARTHROPLASTY Right 05/31/2022   Procedure: RIGHT TOTAL KNEE ARTHROPLASTY;  Surgeon: Mcarthur Rossetti, MD;  Location: [place];  Service: Orthopedics;  Laterality: Right;   Patient Active Problem List   Diagnosis Date Noted   Status post total right knee replacement 05/31/2022   Unilateral primary osteoarthritis, left knee 03/28/2022   Unilateral primary osteoarthritis, right knee 03/28/2022   Primary osteoarthritis of left knee 04/30/2020   Effusion, right knee 03/31/2020   Degenerative arthritis of right knee 03/31/2020   Bilateral primary osteoarthritis of knee 12/27/2018   AV  block 07/29/2018   Central sleep apnea 07/29/2018   Cerebrovascular accident ([place]) 07/29/2018   Primary localized osteoarthrosis, shoulder region 01/13/2012     THERAPY DIAG:  Acute pain of right knee  Muscle weakness (generalized)  Stiffness of right knee, not elsewhere classified  Localized edema  Other abnormalities of gait and mobility  Unsteadiness on feet   PCP: Sueanne Margarita, DO  REFERRING PROVIDER: Jean Rosenthal, MD  REFERRING DIAG:  M17.11 (ICD-10-CM) - Unilateral primary osteoarthritis, right knee  Z96.651 (ICD-10-CM) - Status post total right knee replacement    THERAPY DIAG:  Acute pain of right knee  Muscle weakness (generalized)  Stiffness of right knee, not elsewhere classified  Localized edema  Other abnormalities of gait and mobility  Unsteadiness on feet  Rationale for Evaluation and Treatment: Rehabilitation  ONSET DATE:  05/31/2022 Right TKA  SUBJECTIVE:   SUBJECTIVE STATEMENT: Knee is still swollen but feels like it's getting better.  Still having some mild lightheadedness  PERTINENT HISTORY: OA, Right shoulder hemiarthroplasty, AV block, CVA 01/2018, HTN  PAIN:  Are you having pain? Yes: NPRS scale: today 2/10 and in last week 1/10 - 6/10 Pain location: right knee anterior and lateral Pain description: sore  Aggravating factors: twisting / turning Relieving factors: sit down, massage, meds, ice  PRECAUTIONS: None  WEIGHT BEARING RESTRICTIONS: No  FALLS:  Has patient fallen in last 6 months? No    LIVING ENVIRONMENT: Lives with: lives with their spouse Lives in: [place] 2-story half bath downstairs, bedrooms & bathrooms are upstairs. Stairs: Yes: Internal: 13 steps; on right going up and External: 1 steps; none Has following equipment at home: Single point cane, Walker - 2 wheeled, shower chair, and Grab bars  OCCUPATION: retired   PLOF: Independent, [place] with household mobility without device, and Independent  with community mobility without device  PATIENT GOALS:   function without pain, improve walking, yard work  NEXT MD VISIT: 07/13/2022  OBJECTIVE: (all measures taken from initial evaluation unless otherwise dated)  DIAGNOSTIC FINDINGS: 05/31/2022 post-op X-ray Evidence of patient's recent right total knee replacement with prosthetic components intact and normally located. Mild air and fluid within the knee joint. Skin staples over the anterior soft tissues vertically just left of midline.  PATIENT SURVEYS:  FOTO intake: 47%   predicted:  55%  COGNITION: Overall cognitive status: WFL    SENSATION: WFL  EDEMA:  RLE: above knee 49.8cm  around knee 48.9cm below knee  42.8cm LLE:  above knee 41.8cm  around knee 44cm  below knee  35cm  POSTURE: rounded shoulders, forward head, flexed trunk , and weight shift left  PALPATION: Tenderness along incision, patella borders, joint line, distal quads and proximal gastroc.   LOWER EXTREMITY ROM:   ROM Right eval Right 06/22/22 Right 06/29/22 Right 07/01/22 Right 07/06/22  Knee flexion Seated A: 83* P: 86* Supine A: 105 P: 112 Seated P: 115* A: 107* Seated A: 110 P: 118 Supine:  A: 113  Knee extension Supine: P: -11* A: quad set -14* Seated A: LAQ -34* A: Seated LAQ  -14 P: Supine heel prop -8 Seated  A: LAQ -12* Standing A: -8* Seated A: -12 P: -10 Seated A: -12    (Blank rows = not tested)  LOWER EXTREMITY MMT:  MMT Right eval Left eval  Knee flexion 3-/5   Knee extension 3-/5    (Blank rows = not tested)  FUNCTIONAL TESTS:  18 inch chair transfer: requires use of UEs on armrests and RW to stabilize  GAIT: Distance walked: 100' Assistive device utilized: Environmental consultant - 2 wheeled Level of assistance: SBA Comments: antalgic pattern with decreased RLE stance, right knee flexed in stance, significantly limited in knee flexion for swing - walks with stiff LE,    TODAY'S TREATMENT [facility] Adult PT Treatment: DATE:   07/08/22 TherEx Recumbent bike x 8 min; seat 8 Knee extension 5# bil concentric; Rt eccentric 3x10 Knee flexion 15# 3x10; RLE only Seated SLR on Rt 2x10; mod cues for TKE needed Seated Rt hamstring stretch with overpressure at distal thigh 3x30 sec Leg Press 100# bil 3x10; then RLE only 50# 3x10  Modalities Vasopneumatic: 34 deg, medium compression x 10 minutes on Rt knee with elevation  07/06/22 TherEx Recumbent bike x 8 min; seat 8 Leg Press 100# bil 3x10; then RLE only 50# 3x10 Calf Raises 3x10; light UE support AA LAQ x 3 reps with Lt foot providing assist, working on eccentric control with RLE AA heel slides x 5 reps; 5 sec hold AROM measurements as noted above  Modalities Vasopneumatic: 34 deg, medium compression x 10 minutes on Rt knee with elevation  07/04/2022: Therapeutic Exercise: Sci Fit Recumbent bike: LEs only seat at 12, Level 4 x 8 minutes Leg Press 5 sec hold flex & ext - BLEs 118# 10 reps 1  set back 45* & 1 set back flat,  RLE only 56 # 2 x 10  Hamstring stretch SLR strap with PT manual assist for ext 30 sec hold 3 reps Gastroc stretch step heel depression 30 sec hold 2 reps RLE Step ups (concentric RLE) & down (eccentric RLE) on 6 inch step x 10 reps c single UE support Squats holding sink 10 reps and no UE support picking up items from floor 5 reps.  PT demo & verbal cues on technique. Seated LAQ 5 # & active knee flexion with contralateral LE opposing motion x 15 holding 2-3 seconds   Therapeutic Activities PT recommended using pillow to "tent" sheets off feet and pillow between LEs in sidelying. Pt verbalized understanding.  Slider to 3 cones (ant-lat, lat & post-lat) stance knee flexion on out & extension on in motion 5 reps ea LE with single UE support on sink  Modalities Vasopneumatic: 34 deg, medium compression x 10 minutes on Rt knee with elevation   PATIENT EDUCATION:  Education details: HEP review, POC, rationale for interventions Person  educated: Patient Education method: Consulting civil engineer, Demonstration, Verbal cues, and Handouts Education comprehension: verbalized understanding, returned demonstration, and verbal cues required  HOME EXERCISE PROGRAM: Access Code: [email or web address] URL: [email or web address] Date: 07/08/2022 Prepared by: Faustino Congress  Exercises - Ankle Alphabet in Elevation  - 2-4 x daily - 7 x weekly - 1 sets - 1 reps - Quad Setting and Stretching  - 2-4 x daily - 7 x weekly - 5-10 sets - 10 reps - prop 5-10 minutes & quad set5 seconds hold - Supine Heel Slide with Strap  - 2-3 x daily - 7 x weekly - 2-3 sets - 10 reps - 5 seconds hold - Supine Straight Leg Raises  - 2-3 x daily - 7 x weekly - 2-3 sets - 10 reps - 5 seconds hold - Hooklying Hamstring Stretch with Strap  - 2-4 x daily - 7 x weekly - 1 sets - 3 reps - 20-30 seconds hold - Supine Quadriceps Stretch with Strap on Table  - 2-4 x daily - 7 x weekly - 1 sets - 3 reps - 20-30 seconds hold - Seated Knee Flexion Extension AROM   - 2-4 x daily - 7 x weekly - 2-3 sets - 10 reps - 5 seconds hold - Seated Hamstring Stretch with Strap  - 2-4 x daily - 7 x weekly - 1 sets - 3 reps - 20-30 seconds hold - Seated Long Arc Quad  - 2 x daily - 7 x weekly - 2-3 sets - 10 reps - 5 seconds hold - squat at sink with chair behind you  - 2 x daily - 7 x weekly - 2-3 sets - 10 reps - 5 seconds hold - Gastroc Stretch on Step  - 2-4 x daily - 7 x weekly - 1 sets - 3 reps - 20-30 seconds hold - Seated Straight Leg Raise  - 4-5 x daily - 7 x weekly - 1 sets - 5 reps - 3 sec hold  ASSESSMENT:  CLINICAL IMPRESSION: Pt tolerated session well today with focus on quad activation and TKE.  Will continue to benefit from PT to maximize function.  OBJECTIVE IMPAIRMENTS: Abnormal gait, decreased activity tolerance, decreased balance, decreased endurance, decreased knowledge of condition, decreased knowledge of use of DME, decreased mobility, difficulty walking, decreased  ROM, decreased strength, increased edema, increased muscle spasms, postural dysfunction, and pain.   ACTIVITY LIMITATIONS: carrying, lifting, bending, sitting, standing, squatting, sleeping,  stairs, transfers, and locomotion level  PARTICIPATION LIMITATIONS: meal prep, cleaning, laundry, driving, community activity, and yard work  PERSONAL FACTORS: Fitness and 3+ comorbidities: see PMH are also affecting patient's functional outcome.   REHAB POTENTIAL: Good  CLINICAL DECISION MAKING: Stable/uncomplicated  EVALUATION COMPLEXITY: Low   GOALS: Goals reviewed with patient? Yes  SHORT TERM GOALS: (target date for Short term goals 07/15/2022)   1.  Patient will demonstrate independent use of home exercise program to maintain progress from in clinic treatments.  Goal status: Met 07/01/22  2. Right knee PROM -5* ext to 95* flexion  Goal Status: Partially Met 07/06/22  LONG TERM GOALS: (target dates for all long term goals are 10 weeks  08/26/2022 )   1. Patient will demonstrate/report pain at worst less than or equal to 2/10 to facilitate minimal limitation in daily activity secondary to pain symptoms.  Goal status: New   2. Patient will demonstrate independent use of home exercise program to facilitate ability to maintain/progress functional gains from skilled physical therapy services.  Goal status: New   3. Patient will demonstrate FOTO outcome > or = 55 % to indicate reduced disability due to condition.  Goal status: New   4.  Patient will demonstrate right knee LE MMT 5/5 throughout to faciltiate usual transfers, stairs, squatting at [facility] for daily life.   Goal status: New   5.  Patient right knee AROM 0* ext to 100* flexion. Goal status: New   6.  pt ambulates >500', negotiates ramps & curbs without device and stairs single rail modified independent.  Goal status: New   7.  pt demonstrates activities to enable to return to yard work as pt goal.  Goal Status:  New   PLAN:  PT FREQUENCY: 2-3x/week  PT DURATION: 10 weeks  PLANNED INTERVENTIONS: Therapeutic exercises, Therapeutic activity, Neuromuscular re-education, Balance training, Gait training, Patient/Family education, Self Care, Joint mobilization, Stair training, Vestibular training, DME instructions, Electrical stimulation, Cryotherapy, Moist heat, scar mobilization, Taping, Vasopneumatic device, Ultrasound, Manual therapy, and Re-evaluation  PLAN FOR NEXT SESSION:  will need MD note; really focus on quad activation and TKE,  manual therapy & therapeutic exercise working on range & quad strength, Closed chain functional exercises, vaso to end PRN    Faustino Congress, PT, DPT 07/08/2022, 10:57 AM

## 2022-07-11 ENCOUNTER — Ambulatory Visit: Payer: PPO | Admitting: Physical Therapy

## 2022-07-11 ENCOUNTER — Encounter: Payer: PPO | Admitting: Physical Therapy

## 2022-07-11 ENCOUNTER — Encounter: Payer: Self-pay | Admitting: Physical Therapy

## 2022-07-11 DIAGNOSIS — M25661 Stiffness of right knee, not elsewhere classified: Secondary | ICD-10-CM | POA: Diagnosis not present

## 2022-07-11 DIAGNOSIS — R2689 Other abnormalities of gait and mobility: Secondary | ICD-10-CM

## 2022-07-11 DIAGNOSIS — M25561 Pain in right knee: Secondary | ICD-10-CM

## 2022-07-11 DIAGNOSIS — M6281 Muscle weakness (generalized): Secondary | ICD-10-CM | POA: Diagnosis not present

## 2022-07-11 DIAGNOSIS — R2681 Unsteadiness on feet: Secondary | ICD-10-CM | POA: Diagnosis not present

## 2022-07-11 DIAGNOSIS — R600 Localized edema: Secondary | ICD-10-CM | POA: Diagnosis not present

## 2022-07-11 NOTE — Therapy (Signed)
OUTPATIENT PHYSICAL THERAPY TREATMENT NOTE & PROGRESS NOTE   Patient Name: Bryan Hart MRN: [id number] DOB:20-Oct-1950, 72 y.o., male Today's Date: 07/11/2022  Progress Note Reporting Period 06/14/2022 to 1/07/01/2022  See note below for Objective Data and Assessment of Progress/Goals.    END OF SESSION:   PT End of Session - 07/11/22 1513     Visit Number 9    Number of Visits 25    Date for PT Re-Evaluation 08/26/22    Authorization Type [employer]    Authorization Time Period $25 co-pay    Progress Note Due on Visit 10    PT Start Time 1513    PT Stop Time 1610    PT Time Calculation (min) 57 min    Activity Tolerance Patient tolerated treatment well;No increased pain    Behavior During Therapy [facility] for tasks assessed/performed                   Past Medical History:  Diagnosis Date   Arthritis    HLD (hyperlipidemia)    Hypertension    dr  ed green      stress test   12 yrs ago   PVC's (premature ventricular contractions) 12 years ago   stress test done   Sleep apnea    Stroke [facility])    minor stroke 01/2018   Past Surgical History:  Procedure Laterality Date   ANKLE ARTHROSCOPY  06/27/2008   JOINT REPLACEMENT  06/27/2009   rt shoulder rotator cuff repair   knee surgeries  [id number]   x2   SHOULDER HEMI-ARTHROPLASTY  01/12/2012   Procedure: SHOULDER HEMI-ARTHROPLASTY;  Surgeon: Nita Sells, MD;  Location: [place];  Service: Orthopedics;  Laterality: Right;   SHOULDER SURGERY Right 07/2021   TOTAL KNEE ARTHROPLASTY Right 05/31/2022   Procedure: RIGHT TOTAL KNEE ARTHROPLASTY;  Surgeon: Mcarthur Rossetti, MD;  Location: [place];  Service: Orthopedics;  Laterality: Right;   Patient Active Problem List   Diagnosis Date Noted   Status post total right knee replacement 05/31/2022   Unilateral primary osteoarthritis, left knee 03/28/2022   Unilateral primary osteoarthritis, right knee 03/28/2022   Primary osteoarthritis of left knee 04/30/2020    Effusion, right knee 03/31/2020   Degenerative arthritis of right knee 03/31/2020   Bilateral primary osteoarthritis of knee 12/27/2018   AV block 07/29/2018   Central sleep apnea 07/29/2018   Cerebrovascular accident ([place]) 07/29/2018   Primary localized osteoarthrosis, shoulder region 01/13/2012     THERAPY DIAG:  Acute pain of right knee  Muscle weakness (generalized)  Stiffness of right knee, not elsewhere classified  Localized edema  Other abnormalities of gait and mobility  Unsteadiness on feet   PCP: Sueanne Margarita, DO  REFERRING PROVIDER: Jean Rosenthal, MD  REFERRING DIAG:  M17.11 (ICD-10-CM) - Unilateral primary osteoarthritis, right knee  Z96.651 (ICD-10-CM) - Status post total right knee replacement    THERAPY DIAG:  Acute pain of right knee  Muscle weakness (generalized)  Stiffness of right knee, not elsewhere classified  Localized edema  Other abnormalities of gait and mobility  Unsteadiness on feet  Rationale for Evaluation and Treatment: Rehabilitation  ONSET DATE:  05/31/2022 Right TKA  SUBJECTIVE:   SUBJECTIVE STATEMENT: He went to funeral & did okay on grass.  His legs get tired when standing for church mass.  He has 6 steps out of laundry area and he can tell RLE is better.   PERTINENT HISTORY: OA, Right shoulder hemiarthroplasty, AV block, CVA 01/2018, HTN  PAIN:  Are you having pain? Yes: NPRS scale: today 3/10 and in last week 1/10 - 5/10 Pain location: right knee anterior and lateral Pain description: sore  Aggravating factors: twisting / turning Relieving factors: sit down, massage, meds, ice  PRECAUTIONS: None  WEIGHT BEARING RESTRICTIONS: No  FALLS:  Has patient fallen in last 6 months? No    LIVING ENVIRONMENT: Lives with: lives with their spouse Lives in: [place] 2-story half bath downstairs, bedrooms & bathrooms are upstairs. Stairs: Yes: Internal: 13 steps; on right going up and External: 1 steps; none Has  following equipment at home: Single point cane, Walker - 2 wheeled, shower chair, and Grab bars  OCCUPATION: retired   PLOF: Independent, [place] with household mobility without device, and Independent with community mobility without device  PATIENT GOALS:   function without pain, improve walking, yard work  NEXT MD VISIT: 07/13/2022  OBJECTIVE: (all measures taken from initial evaluation unless otherwise dated)  DIAGNOSTIC FINDINGS: 05/31/2022 post-op X-ray Evidence of patient's recent right total knee replacement with prosthetic components intact and normally located. Mild air and fluid within the knee joint. Skin staples over the anterior soft tissues vertically just left of midline.  PATIENT SURVEYS:  FOTO intake: 47%   predicted:  55%  COGNITION: Overall cognitive status: WFL    SENSATION: WFL  EDEMA:  RLE: above knee 49.8cm  around knee 48.9cm below knee  42.8cm LLE:  above knee 41.8cm  around knee 44cm  below knee  35cm  POSTURE: rounded shoulders, forward head, flexed trunk , and weight shift left  PALPATION: Tenderness along incision, patella borders, joint line, distal quads and proximal gastroc.   LOWER EXTREMITY ROM:   ROM Right eval Right 06/22/22 Right 06/29/22 Right 07/01/22 Right 07/06/22 Right 07/11/22  Knee flexion Seated A: 83* P: 86* Supine A: 105 P: 112 Seated P: 115* A: 107* Seated A: 110 P: 118 Supine:  A: 113 Supine A:115*  Knee extension Supine: P: -11* A: quad set -14* Seated A: LAQ -34* A: Seated LAQ  -14 P: Supine heel prop -8 Seated  A: LAQ -12* Standing A: -8* Seated A: -12 P: -10 Seated A: -12  Supine A: -10*   (Blank rows = not tested)  LOWER EXTREMITY MMT:  MMT Right eval Left eval  Knee flexion 3-/5   Knee extension 3-/5    (Blank rows = not tested)  FUNCTIONAL TESTS:  18 inch chair transfer: requires use of UEs on armrests and RW to stabilize  GAIT: 1:15/2024: pt amb without assistive device >200'  safely.   06/14/2022: Distance walked: 100' Assistive device utilized: Environmental consultant - 2 wheeled Level of assistance: SBA Comments: antalgic pattern with decreased RLE stance, right knee flexed in stance, significantly limited in knee flexion for swing - walks with stiff LE,    TODAY'S TREATMENT [facility] Adult PT Treatment: DATE:  07/11/2022: TherEx Recumbent bike x 8 min level 1; seat 8 Gastroc stretch step heel depression 30 sec hold 2 reps BLEs heel raises reaching BUEs overhead 15 reps Knee extension LAQ 5# & active knee flexion with contralateral LE opposing motion 5 sec hold 10 reps Bridge 5 sec hold on raise & moving feet closer to body on up motion on even number reps 10 sec in flexion stretch 7 reps 3 sets. PT verbal & tactile cues on technique.   Therapeutic Activities: PT demo & verbal cues on using 24" bar stool (29" was too tall) to build standing tolerance.  Alternate ADLs in kitchen,  bathroom & workshop.  Pt sit to/from stand 24" bar stool without UE support 10 reps. Pt verbalized understanding. Step up & step down 6" step BUE support 15 reps. PT demo & verbal cues on technique. Tandem stance on foam beam 30 sec 2 sets RLE in front & in back intermittent touch //bars. PT demo & verbal cues on performing on floor near sink. Counting to 30 & stop count if touch sink. Pt verbalized & return demo understanding.   Modalities Vasopneumatic: 34 deg, medium compression x 10 minutes on Rt knee with elevation   07/08/22 TherEx Recumbent bike x 8 min; seat 8 Knee extension 5# bil concentric; Rt eccentric 3x10 Knee flexion 15# 3x10; RLE only Seated SLR on Rt 2x10; mod cues for TKE needed Seated Rt hamstring stretch with overpressure at distal thigh 3x30 sec Leg Press 100# bil 3x10; then RLE only 50# 3x10  Modalities Vasopneumatic: 34 deg, medium compression x 10 minutes on Rt knee with elevation  07/06/22 TherEx Recumbent bike x 8 min; seat 8 Leg Press 100# bil 3x10; then RLE only 50#  3x10 Calf Raises 3x10; light UE support AA LAQ x 3 reps with Lt foot providing assist, working on eccentric control with RLE AA heel slides x 5 reps; 5 sec hold AROM measurements as noted above  Modalities Vasopneumatic: 34 deg, medium compression x 10 minutes on Rt knee with elevation   PATIENT EDUCATION:  Education details: HEP review, POC, rationale for interventions Person educated: Patient Education method: Consulting civil engineer, Demonstration, Verbal cues, and Handouts Education comprehension: verbalized understanding, returned demonstration, and verbal cues required  HOME EXERCISE PROGRAM: Access Code: [email or web address] URL: [email or web address] Date: 07/08/2022 Prepared by: Faustino Congress  Exercises - Ankle Alphabet in Elevation  - 2-4 x daily - 7 x weekly - 1 sets - 1 reps - Quad Setting and Stretching  - 2-4 x daily - 7 x weekly - 5-10 sets - 10 reps - prop 5-10 minutes & quad set5 seconds hold - Supine Heel Slide with Strap  - 2-3 x daily - 7 x weekly - 2-3 sets - 10 reps - 5 seconds hold - Supine Straight Leg Raises  - 2-3 x daily - 7 x weekly - 2-3 sets - 10 reps - 5 seconds hold - Hooklying Hamstring Stretch with Strap  - 2-4 x daily - 7 x weekly - 1 sets - 3 reps - 20-30 seconds hold - Supine Quadriceps Stretch with Strap on Table  - 2-4 x daily - 7 x weekly - 1 sets - 3 reps - 20-30 seconds hold - Seated Knee Flexion Extension AROM   - 2-4 x daily - 7 x weekly - 2-3 sets - 10 reps - 5 seconds hold - Seated Hamstring Stretch with Strap  - 2-4 x daily - 7 x weekly - 1 sets - 3 reps - 20-30 seconds hold - Seated Long Arc Quad  - 2 x daily - 7 x weekly - 2-3 sets - 10 reps - 5 seconds hold - squat at sink with chair behind you  - 2 x daily - 7 x weekly - 2-3 sets - 10 reps - 5 seconds hold - Gastroc Stretch on Step  - 2-4 x daily - 7 x weekly - 1 sets - 3 reps - 20-30 seconds hold - Seated Straight Leg Raise  - 4-5 x daily - 7 x weekly - 1 sets - 5 reps - 3 sec  hold  ASSESSMENT:  CLINICAL IMPRESSION:  Pt. has  attended 10 visits overall during course of treatment.  See objective data for updated information.  Pt. has made good gains to this point c mild defiicts noted in Rt knee AROM and movement coordination.  Pt. may continue to benefit from skilled PT services to continue progression towards reaching established goals and reduced difficulty due to presentation.    OBJECTIVE IMPAIRMENTS: Abnormal gait, decreased activity tolerance, decreased balance, decreased endurance, decreased knowledge of condition, decreased knowledge of use of DME, decreased mobility, difficulty walking, decreased ROM, decreased strength, increased edema, increased muscle spasms, postural dysfunction, and pain.   ACTIVITY LIMITATIONS: carrying, lifting, bending, sitting, standing, squatting, sleeping, stairs, transfers, and locomotion level  PARTICIPATION LIMITATIONS: meal prep, cleaning, laundry, driving, community activity, and yard work  PERSONAL FACTORS: Fitness and 3+ comorbidities: see PMH are also affecting patient's functional outcome.   REHAB POTENTIAL: Good  CLINICAL DECISION MAKING: Stable/uncomplicated  EVALUATION COMPLEXITY: Low   GOALS: Goals reviewed with patient? Yes  SHORT TERM GOALS: (target date for Short term goals 07/15/2022)   1.  Patient will demonstrate independent use of home exercise program to maintain progress from in clinic treatments.  Goal status: Met 07/01/22  2. Right knee PROM -5* ext to 95* flexion  Goal Status: Partially Met 07/06/22  LONG TERM GOALS: (target dates for all long term goals are 10 weeks  08/26/2022 )   1. Patient will demonstrate/report pain at worst less than or equal to 2/10 to facilitate minimal limitation in daily activity secondary to pain symptoms.  Goal status: New   2. Patient will demonstrate independent use of home exercise program to facilitate ability to maintain/progress functional gains from skilled  physical therapy services.  Goal status: New   3. Patient will demonstrate FOTO outcome > or = 55 % to indicate reduced disability due to condition.  Goal status: New   4.  Patient will demonstrate right knee LE MMT 5/5 throughout to faciltiate usual transfers, stairs, squatting at [facility] for daily life.   Goal status: New   5.  Patient right knee AROM 0* ext to 100* flexion. Goal status: New   6.  pt ambulates >500', negotiates ramps & curbs without device and stairs single rail modified independent.  Goal status: New   7.  pt demonstrates activities to enable to return to yard work as pt goal.  Goal Status: New   PLAN:  PT FREQUENCY: 2-3x/week  PT DURATION: 10 weeks  PLANNED INTERVENTIONS: Therapeutic exercises, Therapeutic activity, Neuromuscular re-education, Balance training, Gait training, Patient/Family education, Self Care, Joint mobilization, Stair training, Vestibular training, DME instructions, Electrical stimulation, Cryotherapy, Moist heat, scar mobilization, Taping, Vasopneumatic device, Ultrasound, Manual therapy, and Re-evaluation  PLAN FOR NEXT SESSION:  focus on quad activation and TKE,  manual therapy & therapeutic exercise working on range & quad strength, Closed chain functional exercises, vaso to end PRN    Jamey Reas, PT, DPT 07/11/2022, 4:39 PM

## 2022-07-13 ENCOUNTER — Ambulatory Visit (INDEPENDENT_AMBULATORY_CARE_PROVIDER_SITE_OTHER): Payer: PPO | Admitting: Orthopaedic Surgery

## 2022-07-13 ENCOUNTER — Telehealth: Payer: Self-pay | Admitting: *Deleted

## 2022-07-13 ENCOUNTER — Encounter: Payer: Self-pay | Admitting: Orthopaedic Surgery

## 2022-07-13 DIAGNOSIS — Z96651 Presence of right artificial knee joint: Secondary | ICD-10-CM

## 2022-07-13 NOTE — Telephone Encounter (Signed)
Ortho bundle in office meeting completed. 

## 2022-07-13 NOTE — Progress Notes (Signed)
The patient is now between 6 and 7 weeks status post a right total knee arthroplasty.  He is 72 years old.  He is in outpatient physical therapy and is making progress.  He is asked a lot of appropriate questions today about continuing therapy as well as driving.  We did have a long thorough discussion about his immediate postoperative memory issues.  When we do pursue a left knee replacement we would like to make sure that all involved is aware of this and we would pursue doing his surgery again at [facility] on the left knee and staying in the same unit of 3C at [facility] since they are aware of him.  He is making great progress.  On exam of his right knee today there is still moderate swelling to be expected.  The knee is ligamentously stable.  His range of motion continues to improve from flexion to extension and his knee feels ligamentously stable.  His left knee is quite stiff.  He will continue outpatient therapy and we will see him back in 4 weeks to see how he is doing overall.  No x-rays are needed at that visit.  We can at some point discussed when to proceed with a left knee replacement but we will him fully recovered from his right knee first and he agrees as well.

## 2022-07-14 ENCOUNTER — Encounter: Payer: Self-pay | Admitting: Physical Therapy

## 2022-07-14 ENCOUNTER — Ambulatory Visit: Payer: PPO | Admitting: Physical Therapy

## 2022-07-14 DIAGNOSIS — M25661 Stiffness of right knee, not elsewhere classified: Secondary | ICD-10-CM | POA: Diagnosis not present

## 2022-07-14 DIAGNOSIS — M6281 Muscle weakness (generalized): Secondary | ICD-10-CM | POA: Diagnosis not present

## 2022-07-14 DIAGNOSIS — R600 Localized edema: Secondary | ICD-10-CM

## 2022-07-14 DIAGNOSIS — R2681 Unsteadiness on feet: Secondary | ICD-10-CM

## 2022-07-14 DIAGNOSIS — R2689 Other abnormalities of gait and mobility: Secondary | ICD-10-CM

## 2022-07-14 DIAGNOSIS — M25561 Pain in right knee: Secondary | ICD-10-CM

## 2022-07-14 NOTE — Therapy (Signed)
OUTPATIENT PHYSICAL THERAPY TREATMENT NOTE   Patient Name: Bryan Hart MRN: [id number] DOB:10-Jan-1951, 72 y.o., male Today's Date: 07/14/2022    END OF SESSION:   PT End of Session - 07/14/22 1012     Visit Number 10    Number of Visits 25    Date for PT Re-Evaluation 08/26/22    Authorization Type [employer]    Authorization Time Period $25 co-pay    Progress Note Due on Visit 19    PT Start Time 1012    PT Stop Time 1058    PT Time Calculation (min) 46 min    Activity Tolerance Patient tolerated treatment well;No increased pain    Behavior During Therapy [facility] for tasks assessed/performed                    Past Medical History:  Diagnosis Date   Arthritis    HLD (hyperlipidemia)    Hypertension    dr  ed green      stress test   12 yrs ago   PVC's (premature ventricular contractions) 12 years ago   stress test done   Sleep apnea    Stroke [facility])    minor stroke 01/2018   Past Surgical History:  Procedure Laterality Date   ANKLE ARTHROSCOPY  06/27/2008   JOINT REPLACEMENT  06/27/2009   rt shoulder rotator cuff repair   knee surgeries  [id number]   x2   SHOULDER HEMI-ARTHROPLASTY  01/12/2012   Procedure: SHOULDER HEMI-ARTHROPLASTY;  Surgeon: Nita Sells, MD;  Location: [place];  Service: Orthopedics;  Laterality: Right;   SHOULDER SURGERY Right 07/2021   TOTAL KNEE ARTHROPLASTY Right 05/31/2022   Procedure: RIGHT TOTAL KNEE ARTHROPLASTY;  Surgeon: Mcarthur Rossetti, MD;  Location: [place];  Service: Orthopedics;  Laterality: Right;   Patient Active Problem List   Diagnosis Date Noted   Status post total right knee replacement 05/31/2022   Unilateral primary osteoarthritis, left knee 03/28/2022   Unilateral primary osteoarthritis, right knee 03/28/2022   Primary osteoarthritis of left knee 04/30/2020   Effusion, right knee 03/31/2020   Degenerative arthritis of right knee 03/31/2020   Bilateral primary osteoarthritis of knee 12/27/2018    AV block 07/29/2018   Central sleep apnea 07/29/2018   Cerebrovascular accident ([place]) 07/29/2018   Primary localized osteoarthrosis, shoulder region 01/13/2012     THERAPY DIAG:  Acute pain of right knee  Muscle weakness (generalized)  Stiffness of right knee, not elsewhere classified  Localized edema  Other abnormalities of gait and mobility  Unsteadiness on feet   PCP: Sueanne Margarita, DO  REFERRING PROVIDER: Jean Rosenthal, MD  REFERRING DIAG:  M17.11 (ICD-10-CM) - Unilateral primary osteoarthritis, right knee  Z96.651 (ICD-10-CM) - Status post total right knee replacement    THERAPY DIAG:  Acute pain of right knee  Muscle weakness (generalized)  Stiffness of right knee, not elsewhere classified  Localized edema  Other abnormalities of gait and mobility  Unsteadiness on feet  Rationale for Evaluation and Treatment: Rehabilitation  ONSET DATE:  05/31/2022 Right TKA  SUBJECTIVE:   SUBJECTIVE STATEMENT: 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    PERTINENT HISTORY: OA, Right shoulder hemiarthroplasty, AV block, CVA 01/2018, HTN  PAIN:  Are you having pain? Yes: NPRS scale: today 1/10 and in last week 1/10 - 5/10 Pain location: right knee anterior and lateral Pain description: sore  Aggravating factors: twisting / turning Relieving factors: sit down, massage, meds, ice  PRECAUTIONS: None  WEIGHT BEARING RESTRICTIONS: No  FALLS:  Has patient fallen in last 6 months? No    LIVING ENVIRONMENT: Lives with: lives with their spouse Lives in: [place] 2-story half bath downstairs, bedrooms & bathrooms are upstairs. Stairs: Yes: Internal: 13 steps; on right going up and External: 1 steps; none Has following equipment at home: Single point cane, Walker - 2 wheeled, shower chair, and Grab bars  OCCUPATION: retired   PLOF: Independent, [place] with household mobility without device, and Independent with community  mobility without device  PATIENT GOALS:   function without pain, improve walking, yard work  NEXT MD VISIT: 07/13/2022  OBJECTIVE: (all measures taken from initial evaluation unless otherwise dated)  DIAGNOSTIC FINDINGS: 05/31/2022 post-op X-ray Evidence of patient's recent right total knee replacement with prosthetic components intact and normally located. Mild air and fluid within the knee joint. Skin staples over the anterior soft tissues vertically just left of midline.  PATIENT SURVEYS:  FOTO intake: 47%   predicted:  55%  COGNITION: Overall cognitive status: WFL    SENSATION: WFL  EDEMA:  RLE: above knee 49.8cm  around knee 48.9cm below knee  42.8cm LLE:  above knee 41.8cm  around knee 44cm  below knee  35cm  POSTURE: rounded shoulders, forward head, flexed trunk , and weight shift left  PALPATION: Tenderness along incision, patella borders, joint line, distal quads and proximal gastroc.   LOWER EXTREMITY ROM:   ROM Right eval Right 06/22/22 Right 06/29/22 Right 07/01/22 Right 07/06/22 Right 07/11/22  Knee flexion Seated A: 83* P: 86* Supine A: 105 P: 112 Seated P: 115* A: 107* Seated A: 110 P: 118 Supine:  A: 113 Supine A:115*  Knee extension Supine: P: -11* A: quad set -14* Seated A: LAQ -34* A: Seated LAQ  -14 P: Supine heel prop -8 Seated  A: LAQ -12* Standing A: -8* Seated A: -12 P: -10 Seated A: -12  Supine A: -10*   (Blank rows = not tested)  LOWER EXTREMITY MMT:  MMT Right eval Left 07/14/2022 Right 07/14/2022  Knee flexion 3-/5 HH Dynameter 24.1#, 23.7# HH Dynameter 23.2#, 21.8# RLE:LLE 94%  Knee extension 3-/5 HH Dynameter 36.5#, 37.6# HH Dynameter 31.8#, 37.9# RLE:LLE  94%   (Blank rows = not tested)  FUNCTIONAL TESTS:  18 inch chair transfer: requires use of UEs on armrests and RW to stabilize  GAIT: 1:15/2024: pt amb without assistive device >200' safely.   06/14/2022: Distance walked: 100' Assistive device utilized:  Environmental consultant - 2 wheeled Level of assistance: SBA Comments: antalgic pattern with decreased RLE stance, right knee flexed in stance, significantly limited in knee flexion for swing - walks with stiff LE,    TODAY'S TREATMENT [facility] Adult PT Treatment: DATE:  07/14/2022: TherEx Recumbent bike x 8 min level 3; seat 8 Gastroc stretch step heel depression 30 sec hold 2 reps BLEs heel raises reaching BUEs overhead 15 reps Knee extension LAQ 5# & active knee flexion with contralateral LE opposing motion 5 sec hold 10 reps Standing on foam RLE TKE green theraband 10 reps 1st set TKE only, 2nd set raising left heel for increased RLE WB & balance.   Therapeutic Activities: PT reviewed using 24" bar stool to build standing tolerance. May help with the funeral standing. Pt verbalized understanding. Sit to/from stand 18" chair without UE assist 10 reps.  PT demo & verbal cues on technique. Step up & step down 6" step BUE support 15 reps. PT demo & verbal cues on technique.  07/11/2022: TherEx Recumbent bike x 8 min level 1; seat 8 Gastroc stretch step heel depression 30 sec hold 2 reps BLEs heel raises reaching BUEs overhead 15 reps Knee extension LAQ 5# & active knee flexion with contralateral LE opposing motion 5 sec hold 10 reps Bridge 5 sec hold on raise & moving feet closer to body on up motion on even number reps 10 sec in flexion stretch 7 reps 3 sets. PT verbal & tactile cues on technique.   Therapeutic Activities: PT demo & verbal cues on using 24" bar stool (29" was too tall) to build standing tolerance.  Alternate ADLs in kitchen, bathroom & workshop.  Pt sit to/from stand 24" bar stool without UE support 10 reps. Pt verbalized understanding. Step up & step down 6" step BUE support 15 reps. PT demo & verbal cues on technique. Tandem stance on foam beam 30 sec 2 sets RLE in front & in back intermittent touch //bars. PT demo & verbal cues on performing on floor near sink. Counting to 30 & stop  count if touch sink. Pt verbalized & return demo understanding.   Modalities Vasopneumatic: 34 deg, medium compression x 10 minutes on Rt knee with elevation   07/08/22 TherEx Recumbent bike x 8 min; seat 8 Knee extension 5# bil concentric; Rt eccentric 3x10 Knee flexion 15# 3x10; RLE only Seated SLR on Rt 2x10; mod cues for TKE needed Seated Rt hamstring stretch with overpressure at distal thigh 3x30 sec Leg Press 100# bil 3x10; then RLE only 50# 3x10  Modalities Vasopneumatic: 34 deg, medium compression x 10 minutes on Rt knee with elevation   PATIENT EDUCATION:  Education details: HEP review, POC, rationale for interventions Person educated: Patient Education method: Consulting civil engineer, Demonstration, Verbal cues, and Handouts Education comprehension: verbalized understanding, returned demonstration, and verbal cues required  HOME EXERCISE PROGRAM: Access Code: [email or web address] URL: [email or web address] Date: 07/08/2022 Prepared by: Faustino Congress  Exercises - Ankle Alphabet in Elevation  - 2-4 x daily - 7 x weekly - 1 sets - 1 reps - Quad Setting and Stretching  - 2-4 x daily - 7 x weekly - 5-10 sets - 10 reps - prop 5-10 minutes & quad set5 seconds hold - Supine Heel Slide with Strap  - 2-3 x daily - 7 x weekly - 2-3 sets - 10 reps - 5 seconds hold - Supine Straight Leg Raises  - 2-3 x daily - 7 x weekly - 2-3 sets - 10 reps - 5 seconds hold - Hooklying Hamstring Stretch with Strap  - 2-4 x daily - 7 x weekly - 1 sets - 3 reps - 20-30 seconds hold - Supine Quadriceps Stretch with Strap on Table  - 2-4 x daily - 7 x weekly - 1 sets - 3 reps - 20-30 seconds hold - Seated Knee Flexion Extension AROM   - 2-4 x daily - 7 x weekly - 2-3 sets - 10 reps - 5 seconds hold - Seated Hamstring Stretch with Strap  - 2-4 x daily - 7 x weekly - 1 sets - 3 reps - 20-30 seconds hold - Seated Long Arc Quad  - 2 x daily - 7 x weekly - 2-3 sets - 10 reps - 5 seconds hold - squat at sink  with chair behind you  - 2 x daily - 7 x weekly - 2-3 sets - 10 reps - 5 seconds hold - Gastroc Stretch on Step  - 2-4 x daily - 7 x weekly - 1 sets -  3 reps - 20-30 seconds hold - Seated Straight Leg Raise  - 4-5 x daily - 7 x weekly - 1 sets - 5 reps - 3 sec hold  ASSESSMENT:  CLINICAL IMPRESSION:  PT used Hand Held dynameter for strength testing.  RLE is 94% of LLE for both flex & ext but BLEs are well below age norms.  PT session beginning to focus more on strength & balance for function which patient finds challenging.  Pt. may continue to benefit from skilled PT services to continue progression towards reaching established goals and reduced difficulty due to presentation.  OBJECTIVE IMPAIRMENTS: Abnormal gait, decreased activity tolerance, decreased balance, decreased endurance, decreased knowledge of condition, decreased knowledge of use of DME, decreased mobility, difficulty walking, decreased ROM, decreased strength, increased edema, increased muscle spasms, postural dysfunction, and pain.   ACTIVITY LIMITATIONS: carrying, lifting, bending, sitting, standing, squatting, sleeping, stairs, transfers, and locomotion level  PARTICIPATION LIMITATIONS: meal prep, cleaning, laundry, driving, community activity, and yard work  PERSONAL FACTORS: Fitness and 3+ comorbidities: see PMH are also affecting patient's functional outcome.   REHAB POTENTIAL: Good  CLINICAL DECISION MAKING: Stable/uncomplicated  EVALUATION COMPLEXITY: Low   GOALS: Goals reviewed with patient? Yes  SHORT TERM GOALS: (target date for Short term goals 07/15/2022)   1.  Patient will demonstrate independent use of home exercise program to maintain progress from in clinic treatments.  Goal status: Met 07/01/22  2. Right knee PROM -5* ext to 95* flexion  Goal Status: Partially Met 07/06/22  LONG TERM GOALS: (target dates for all long term goals are 10 weeks  08/26/2022 )   1. Patient will demonstrate/report pain at  worst less than or equal to 2/10 to facilitate minimal limitation in daily activity secondary to pain symptoms.  Goal status: New   2. Patient will demonstrate independent use of home exercise program to facilitate ability to maintain/progress functional gains from skilled physical therapy services.  Goal status: New   3. Patient will demonstrate FOTO outcome > or = 55 % to indicate reduced disability due to condition.  Goal status: New   4.  Patient will demonstrate right knee LE MMT 5/5 throughout to faciltiate usual transfers, stairs, squatting at [facility] for daily life.   Goal status: New   5.  Patient right knee AROM 0* ext to 100* flexion. Goal status: New   6.  pt ambulates >500', negotiates ramps & curbs without device and stairs single rail modified independent.  Goal status: New   7.  pt demonstrates activities to enable to return to yard work as pt goal.  Goal Status: New   PLAN:  PT FREQUENCY: 2-3x/week  PT DURATION: 10 weeks  PLANNED INTERVENTIONS: Therapeutic exercises, Therapeutic activity, Neuromuscular re-education, Balance training, Gait training, Patient/Family education, Self Care, Joint mobilization, Stair training, Vestibular training, DME instructions, Electrical stimulation, Cryotherapy, Moist heat, scar mobilization, Taping, Vasopneumatic device, Ultrasound, Manual therapy, and Re-evaluation  PLAN FOR NEXT SESSION:  therapeutic exercise for strength & balance,  Closed chain functional exercises, vaso to end PRN    Jamey Reas, PT, DPT 07/14/2022, 11:29 AM

## 2022-07-18 ENCOUNTER — Encounter: Payer: Self-pay | Admitting: Physical Therapy

## 2022-07-18 ENCOUNTER — Ambulatory Visit: Payer: PPO | Admitting: Physical Therapy

## 2022-07-18 DIAGNOSIS — M25561 Pain in right knee: Secondary | ICD-10-CM

## 2022-07-18 DIAGNOSIS — M25661 Stiffness of right knee, not elsewhere classified: Secondary | ICD-10-CM

## 2022-07-18 DIAGNOSIS — R600 Localized edema: Secondary | ICD-10-CM | POA: Diagnosis not present

## 2022-07-18 DIAGNOSIS — R2689 Other abnormalities of gait and mobility: Secondary | ICD-10-CM | POA: Diagnosis not present

## 2022-07-18 DIAGNOSIS — M6281 Muscle weakness (generalized): Secondary | ICD-10-CM

## 2022-07-18 DIAGNOSIS — R2681 Unsteadiness on feet: Secondary | ICD-10-CM | POA: Diagnosis not present

## 2022-07-18 NOTE — Therapy (Signed)
OUTPATIENT PHYSICAL THERAPY TREATMENT NOTE   Patient Name: Bryan Hart MRN: [id number] DOB:[birth year], 72 y.o., male Today's Date: 07/18/2022    END OF SESSION:   PT End of Session - 07/18/22 1018     Visit Number 11    Number of Visits 25    Date for PT Re-Evaluation 08/26/22    Authorization Type [employer]    Authorization Time Period $25 co-pay    Progress Note Due on Visit 40    PT Start Time 1015    PT Stop Time 1104    PT Time Calculation (min) 49 min    Activity Tolerance Patient tolerated treatment well;No increased pain    Behavior During Therapy [facility] for tasks assessed/performed               Past Medical History:  Diagnosis Date   Arthritis    HLD (hyperlipidemia)    Hypertension    dr  ed green      stress test   12 yrs ago   PVC's (premature ventricular contractions) 12 years ago   stress test done   Sleep apnea    Stroke [facility])    minor stroke 01/2018   Past Surgical History:  Procedure Laterality Date   ANKLE ARTHROSCOPY  06/27/2008   JOINT REPLACEMENT  06/27/2009   rt shoulder rotator cuff repair   knee surgeries  [phone]   x2   SHOULDER HEMI-ARTHROPLASTY  01/12/2012   Procedure: SHOULDER HEMI-ARTHROPLASTY;  Surgeon: Nita Sells, MD;  Location: [place];  Service: Orthopedics;  Laterality: Right;   SHOULDER SURGERY Right 07/2021   TOTAL KNEE ARTHROPLASTY Right 05/31/2022   Procedure: RIGHT TOTAL KNEE ARTHROPLASTY;  Surgeon: Mcarthur Rossetti, MD;  Location: [place];  Service: Orthopedics;  Laterality: Right;   Patient Active Problem List   Diagnosis Date Noted   Status post total right knee replacement 05/31/2022   Unilateral primary osteoarthritis, left knee 03/28/2022   Unilateral primary osteoarthritis, right knee 03/28/2022   Primary osteoarthritis of left knee 04/30/2020   Effusion, right knee 03/31/2020   Degenerative arthritis of right knee 03/31/2020   Bilateral primary osteoarthritis of knee 12/27/2018   AV block  07/29/2018   Central sleep apnea 07/29/2018   Cerebrovascular accident ([place]) 07/29/2018   Primary localized osteoarthrosis, shoulder region 01/13/2012     THERAPY DIAG:  Acute pain of right knee  Muscle weakness (generalized)  Stiffness of right knee, not elsewhere classified  Localized edema  Other abnormalities of gait and mobility  Unsteadiness on feet   PCP: Sueanne Margarita, DO  REFERRING PROVIDER: Jean Rosenthal, MD  REFERRING DIAG:  M17.11 (ICD-10-CM) - Unilateral primary osteoarthritis, right knee  Z96.651 (ICD-10-CM) - Status post total right knee replacement    EVAL THERAPY DIAG:  Acute pain of right knee  Muscle weakness (generalized)  Stiffness of right knee, not elsewhere classified  Localized edema  Other abnormalities of gait and mobility  Unsteadiness on feet  Rationale for Evaluation and Treatment: Rehabilitation  ONSET DATE:  05/31/2022 Right TKA  SUBJECTIVE:   SUBJECTIVE STATEMENT: Doing well, knee is a little stiff this morning but doing well.  PERTINENT HISTORY: OA, Right shoulder hemiarthroplasty, AV block, CVA 01/2018, HTN  PAIN:  Are you having pain? Yes: NPRS scale: today 1/10 and in last week 1/10 - 5/10 Pain location: right knee anterior and lateral Pain description: sore  Aggravating factors: twisting / turning Relieving factors: sit down, massage, meds, ice  PRECAUTIONS: None  WEIGHT BEARING RESTRICTIONS:  No  FALLS:  Has patient fallen in last 6 months? No    LIVING ENVIRONMENT: Lives with: lives with their spouse Lives in: [place] 2-story half bath downstairs, bedrooms & bathrooms are upstairs. Stairs: Yes: Internal: 13 steps; on right going up and External: 1 steps; none Has following equipment at home: Single point cane, Walker - 2 wheeled, shower chair, and Grab bars  OCCUPATION: retired   PLOF: Independent, [place] with household mobility without device, and Independent with community mobility without  device  PATIENT GOALS:   function without pain, improve walking, yard work  NEXT MD VISIT: 07/13/2022  OBJECTIVE: (all measures taken from initial evaluation unless otherwise dated)  DIAGNOSTIC FINDINGS: 05/31/2022 post-op X-ray Evidence of patient's recent right total knee replacement with prosthetic components intact and normally located. Mild air and fluid within the knee joint. Skin staples over the anterior soft tissues vertically just left of midline.  PATIENT SURVEYS:  EVAL: FOTO intake: 47%   predicted:  55% 07/18/22: FOTO 64  EDEMA:  EVAL: RLE: above knee 49.8cm  around knee 48.9cm below knee  42.8cm LLE:  above knee 41.8cm  around knee 44cm  below knee  35cm  POSTURE: rounded shoulders, forward head, flexed trunk , and weight shift left  PALPATION: Tenderness along incision, patella borders, joint line, distal quads and proximal gastroc.   LOWER EXTREMITY ROM:   ROM Right eval Right 06/22/22 Right 06/29/22 Right 07/01/22 Right 07/06/22 Right 07/11/22 Right 07/18/22  Knee flexion Seated A: 83* P: 86* Supine A: 105 P: 112 Seated P: 115* A: 107* Seated A: 110 P: 118 Supine:  A: 113 Supine A:115*   Knee extension Supine: P: -11* A: quad set -14* Seated A: LAQ -34* A: Seated LAQ  -14 P: Supine heel prop -8 Seated  A: LAQ -12* Standing A: -8* Seated A: -12 P: -10 Seated A: -12  Supine A: -10* Seated LAQ: -7   (Blank rows = not tested)  LOWER EXTREMITY MMT:  MMT Right eval Left 07/14/2022 Right 07/14/2022  Knee flexion 3-/5 HH Dynameter 24.1#, 23.7# HH Dynameter 23.2#, 21.8# RLE:LLE 94%  Knee extension 3-/5 HH Dynameter 36.5#, 37.6# HH Dynameter 31.8#, 37.9# RLE:LLE  94%   (Blank rows = not tested)  FUNCTIONAL TESTS:  18 inch chair transfer: requires use of UEs on armrests and RW to stabilize  GAIT: 1:15/2024: pt amb without assistive device >200' safely.   06/14/2022: Distance walked: 100' Assistive device utilized: Environmental consultant - 2  wheeled Level of assistance: SBA Comments: antalgic pattern with decreased RLE stance, right knee flexed in stance, significantly limited in knee flexion for swing - walks with stiff LE,    TODAY'S TREATMENT [facility] Adult PT Treatment: DATE:  07/18/22: TherEx Recumbent bike x 8 min level 4; seat 8 Knee extension on BATCA AA concentric 5# on Rt 3x10 Seated quad set 3x10; 5 sec hold Seated SLR 2x10 - min cues to maintain TKE Rt LAQ 5# 3x10; 3 sec hold Sit to/from stand without UE support 2x10  Modalities Vasopneumatic: 34 deg, medium compression x 10 minutes on Rt knee with elevation  07/14/2022: TherEx Recumbent bike x 8 min level 3; seat 8 Gastroc stretch step heel depression 30 sec hold 2 reps BLEs heel raises reaching BUEs overhead 15 reps Knee extension LAQ 5# & active knee flexion with contralateral LE opposing motion 5 sec hold 10 reps Standing on foam RLE TKE green theraband 10 reps 1st set TKE only, 2nd set raising left heel for increased RLE WB &  balance.   Therapeutic Activities: PT reviewed using 24" bar stool to build standing tolerance. May help with the funeral standing. Pt verbalized understanding. Sit to/from stand 18" chair without UE assist 10 reps.  PT demo & verbal cues on technique. Step up & step down 6" step BUE support 15 reps. PT demo & verbal cues on technique.   07/11/2022: TherEx Recumbent bike x 8 min level 1; seat 8 Gastroc stretch step heel depression 30 sec hold 2 reps BLEs heel raises reaching BUEs overhead 15 reps Knee extension LAQ 5# & active knee flexion with contralateral LE opposing motion 5 sec hold 10 reps Bridge 5 sec hold on raise & moving feet closer to body on up motion on even number reps 10 sec in flexion stretch 7 reps 3 sets. PT verbal & tactile cues on technique.   Therapeutic Activities: PT demo & verbal cues on using 24" bar stool (29" was too tall) to build standing tolerance.  Alternate ADLs in kitchen, bathroom & workshop.  Pt  sit to/from stand 24" bar stool without UE support 10 reps. Pt verbalized understanding. Step up & step down 6" step BUE support 15 reps. PT demo & verbal cues on technique. Tandem stance on foam beam 30 sec 2 sets RLE in front & in back intermittent touch //bars. PT demo & verbal cues on performing on floor near sink. Counting to 30 & stop count if touch sink. Pt verbalized & return demo understanding.   Modalities Vasopneumatic: 34 deg, medium compression x 10 minutes on Rt knee with elevation    PATIENT EDUCATION:  Education details: HEP review, POC, rationale for interventions Person educated: Patient Education method: Explanation, Demonstration, Verbal cues, and Handouts Education comprehension: verbalized understanding, returned demonstration, and verbal cues required  HOME EXERCISE PROGRAM: Access Code: [email or web address] URL: [email or web address] Date: 07/08/2022 Prepared by: Faustino Congress  Exercises - Ankle Alphabet in Elevation  - 2-4 x daily - 7 x weekly - 1 sets - 1 reps - Quad Setting and Stretching  - 2-4 x daily - 7 x weekly - 5-10 sets - 10 reps - prop 5-10 minutes & quad set5 seconds hold - Supine Heel Slide with Strap  - 2-3 x daily - 7 x weekly - 2-3 sets - 10 reps - 5 seconds hold - Supine Straight Leg Raises  - 2-3 x daily - 7 x weekly - 2-3 sets - 10 reps - 5 seconds hold - Hooklying Hamstring Stretch with Strap  - 2-4 x daily - 7 x weekly - 1 sets - 3 reps - 20-30 seconds hold - Supine Quadriceps Stretch with Strap on Table  - 2-4 x daily - 7 x weekly - 1 sets - 3 reps - 20-30 seconds hold - Seated Knee Flexion Extension AROM   - 2-4 x daily - 7 x weekly - 2-3 sets - 10 reps - 5 seconds hold - Seated Hamstring Stretch with Strap  - 2-4 x daily - 7 x weekly - 1 sets - 3 reps - 20-30 seconds hold - Seated Long Arc Quad  - 2 x daily - 7 x weekly - 2-3 sets - 10 reps - 5 seconds hold - squat at sink with chair behind you  - 2 x daily - 7 x weekly - 2-3 sets  - 10 reps - 5 seconds hold - Gastroc Stretch on Step  - 2-4 x daily - 7 x weekly - 1 sets - 3 reps - 20-30 seconds hold - Seated  Straight Leg Raise  - 4-5 x daily - 7 x weekly - 1 sets - 5 reps - 3 sec hold  ASSESSMENT:  CLINICAL IMPRESSION:  Steady progress in active knee extension and quad activation today.  Continues to progress well with PT as main focus today was quad strengthening.  Wlll continue to benefit from PT to maximize function.   OBJECTIVE IMPAIRMENTS: Abnormal gait, decreased activity tolerance, decreased balance, decreased endurance, decreased knowledge of condition, decreased knowledge of use of DME, decreased mobility, difficulty walking, decreased ROM, decreased strength, increased edema, increased muscle spasms, postural dysfunction, and pain.   ACTIVITY LIMITATIONS: carrying, lifting, bending, sitting, standing, squatting, sleeping, stairs, transfers, and locomotion level  PARTICIPATION LIMITATIONS: meal prep, cleaning, laundry, driving, community activity, and yard work  PERSONAL FACTORS: Fitness and 3+ comorbidities: see PMH are also affecting patient's functional outcome.   REHAB POTENTIAL: Good  CLINICAL DECISION MAKING: Stable/uncomplicated  EVALUATION COMPLEXITY: Low   GOALS: Goals reviewed with patient? Yes  SHORT TERM GOALS: (target date for Short term goals 07/15/2022)   1.  Patient will demonstrate independent use of home exercise program to maintain progress from in clinic treatments.  Goal status: Met 07/01/22  2. Right knee PROM -5* ext to 95* flexion  Goal Status: Partially Met 07/06/22  LONG TERM GOALS: (target dates for all long term goals are 10 weeks  08/26/2022 )   1. Patient will demonstrate/report pain at worst less than or equal to 2/10 to facilitate minimal limitation in daily activity secondary to pain symptoms.  Goal status: New   2. Patient will demonstrate independent use of home exercise program to facilitate ability to  maintain/progress functional gains from skilled physical therapy services.  Goal status: New   3. Patient will demonstrate FOTO outcome > or = 55 % to indicate reduced disability due to condition.  Goal status: New   4.  Patient will demonstrate right knee LE MMT 5/5 throughout to faciltiate usual transfers, stairs, squatting at [facility] for daily life.   Goal status: New   5.  Patient right knee AROM 0* ext to 100* flexion. Goal status: New   6.  pt ambulates >500', negotiates ramps & curbs without device and stairs single rail modified independent.  Goal status: New   7.  pt demonstrates activities to enable to return to yard work as pt goal.  Goal Status: New   PLAN:  PT FREQUENCY: 2-3x/week  PT DURATION: 10 weeks  PLANNED INTERVENTIONS: Therapeutic exercises, Therapeutic activity, Neuromuscular re-education, Balance training, Gait training, Patient/Family education, Self Care, Joint mobilization, Stair training, Vestibular training, DME instructions, Electrical stimulation, Cryotherapy, Moist heat, scar mobilization, Taping, Vasopneumatic device, Ultrasound, Manual therapy, and Re-evaluation  PLAN FOR NEXT SESSION:  therapeutic exercise for strength & balance,  Quad activation, Closed chain functional exercises, vaso to end PRN    Faustino Congress, PT, DPT 07/18/2022, 11:00 AM

## 2022-07-21 ENCOUNTER — Encounter: Payer: Self-pay | Admitting: Physical Therapy

## 2022-07-21 ENCOUNTER — Ambulatory Visit: Payer: PPO | Admitting: Physical Therapy

## 2022-07-21 DIAGNOSIS — R600 Localized edema: Secondary | ICD-10-CM

## 2022-07-21 DIAGNOSIS — M6281 Muscle weakness (generalized): Secondary | ICD-10-CM

## 2022-07-21 DIAGNOSIS — M25661 Stiffness of right knee, not elsewhere classified: Secondary | ICD-10-CM | POA: Diagnosis not present

## 2022-07-21 DIAGNOSIS — R2681 Unsteadiness on feet: Secondary | ICD-10-CM | POA: Diagnosis not present

## 2022-07-21 DIAGNOSIS — M25561 Pain in right knee: Secondary | ICD-10-CM | POA: Diagnosis not present

## 2022-07-21 DIAGNOSIS — R2689 Other abnormalities of gait and mobility: Secondary | ICD-10-CM | POA: Diagnosis not present

## 2022-07-21 NOTE — Therapy (Signed)
OUTPATIENT PHYSICAL THERAPY TREATMENT NOTE   Patient Name: Bryan Hart MRN: [id number] DOB:June 01, 1951, 72 y.o., male Today's Date: 07/21/2022    END OF SESSION:   PT End of Session - 07/21/22 1014     Visit Number 12    Number of Visits 25    Date for PT Re-Evaluation 08/26/22    Authorization Type [employer]    Authorization Time Period $25 co-pay    Progress Note Due on Visit 51    PT Start Time 1015    PT Stop Time 1056    PT Time Calculation (min) 41 min    Activity Tolerance Patient tolerated treatment well;No increased pain    Behavior During Therapy [facility] for tasks assessed/performed                Past Medical History:  Diagnosis Date   Arthritis    HLD (hyperlipidemia)    Hypertension    dr  ed green      stress test   12 yrs ago   PVC's (premature ventricular contractions) 12 years ago   stress test done   Sleep apnea    Stroke [facility])    minor stroke 01/2018   Past Surgical History:  Procedure Laterality Date   ANKLE ARTHROSCOPY  06/27/2008   JOINT REPLACEMENT  06/27/2009   rt shoulder rotator cuff repair   knee surgeries  [id number]   x2   SHOULDER HEMI-ARTHROPLASTY  01/12/2012   Procedure: SHOULDER HEMI-ARTHROPLASTY;  Surgeon: Nita Sells, MD;  Location: [place];  Service: Orthopedics;  Laterality: Right;   SHOULDER SURGERY Right 07/2021   TOTAL KNEE ARTHROPLASTY Right 05/31/2022   Procedure: RIGHT TOTAL KNEE ARTHROPLASTY;  Surgeon: Mcarthur Rossetti, MD;  Location: [place];  Service: Orthopedics;  Laterality: Right;   Patient Active Problem List   Diagnosis Date Noted   Status post total right knee replacement 05/31/2022   Unilateral primary osteoarthritis, left knee 03/28/2022   Unilateral primary osteoarthritis, right knee 03/28/2022   Primary osteoarthritis of left knee 04/30/2020   Effusion, right knee 03/31/2020   Degenerative arthritis of right knee 03/31/2020   Bilateral primary osteoarthritis of knee 12/27/2018   AV  block 07/29/2018   Central sleep apnea 07/29/2018   Cerebrovascular accident ([place]) 07/29/2018   Primary localized osteoarthrosis, shoulder region 01/13/2012     THERAPY DIAG:  Acute pain of right knee  Muscle weakness (generalized)  Stiffness of right knee, not elsewhere classified  Localized edema  Other abnormalities of gait and mobility  Unsteadiness on feet   PCP: Sueanne Margarita, DO  REFERRING PROVIDER: Jean Rosenthal, MD  REFERRING DIAG:  M17.11 (ICD-10-CM) - Unilateral primary osteoarthritis, right knee  Z96.651 (ICD-10-CM) - Status post total right knee replacement    EVAL THERAPY DIAG:  Acute pain of right knee  Muscle weakness (generalized)  Stiffness of right knee, not elsewhere classified  Localized edema  Other abnormalities of gait and mobility  Unsteadiness on feet  Rationale for Evaluation and Treatment: Rehabilitation  ONSET DATE:  05/31/2022 Right TKA  SUBJECTIVE:   SUBJECTIVE STATEMENT: His wife monitors his exercises.    PERTINENT HISTORY: OA, Right shoulder hemiarthroplasty, AV block, CVA 01/2018, HTN  PAIN:  Are you having pain? Yes: NPRS scale: today 1/10 and in last week 0/10 - 5/10 Pain location: right knee anterior and lateral Pain description: sore  Aggravating factors: twisting / turning Relieving factors: sit down, massage, meds, ice  PRECAUTIONS: None  WEIGHT BEARING RESTRICTIONS: No  FALLS:  Has patient fallen in last 6 months? No    LIVING ENVIRONMENT: Lives with: lives with their spouse Lives in: [place] 2-story half bath downstairs, bedrooms & bathrooms are upstairs. Stairs: Yes: Internal: 13 steps; on right going up and External: 1 steps; none Has following equipment at home: Single point cane, Walker - 2 wheeled, shower chair, and Grab bars  OCCUPATION: retired   PLOF: Independent, [place] with household mobility without device, and Independent with community mobility without device  PATIENT GOALS:    function without pain, improve walking, yard work  NEXT MD VISIT: 07/13/2022  OBJECTIVE: (all measures taken from initial evaluation unless otherwise dated)  DIAGNOSTIC FINDINGS: 05/31/2022 post-op X-ray Evidence of patient's recent right total knee replacement with prosthetic components intact and normally located. Mild air and fluid within the knee joint. Skin staples over the anterior soft tissues vertically just left of midline.  PATIENT SURVEYS:  EVAL: FOTO intake: 47%   predicted:  55% 07/18/22: FOTO 64  EDEMA:  EVAL: RLE: above knee 49.8cm  around knee 48.9cm below knee  42.8cm LLE:  above knee 41.8cm  around knee 44cm  below knee  35cm  POSTURE: rounded shoulders, forward head, flexed trunk , and weight shift left  PALPATION: Tenderness along incision, patella borders, joint line, distal quads and proximal gastroc.   LOWER EXTREMITY ROM:   ROM Right eval Right 06/22/22 Right 06/29/22 Right 07/01/22 Right 07/06/22 Right 07/11/22 Right 07/18/22  Knee flexion Seated A: 83* P: 86* Supine A: 105 P: 112 Seated P: 115* A: 107* Seated A: 110 P: 118 Supine:  A: 113 Supine A:115*   Knee extension Supine: P: -11* A: quad set -14* Seated A: LAQ -34* A: Seated LAQ  -14 P: Supine heel prop -8 Seated  A: LAQ -12* Standing A: -8* Seated A: -12 P: -10 Seated A: -12  Supine A: -10* Seated LAQ: -7   (Blank rows = not tested)  LOWER EXTREMITY MMT:  MMT Right eval Left 07/14/2022 Right 07/14/2022  Knee flexion 3-/5 HH Dynameter 24.1#, 23.7# HH Dynameter 23.2#, 21.8# RLE:LLE 94%  Knee extension 3-/5 HH Dynameter 36.5#, 37.6# HH Dynameter 31.8#, 37.9# RLE:LLE  94%   (Blank rows = not tested)  FUNCTIONAL TESTS:  18 inch chair transfer: requires use of UEs on armrests and RW to stabilize  GAIT: 1:15/2024: pt amb without assistive device >200' safely.   06/14/2022: Distance walked: 100' Assistive device utilized: Environmental consultant - 2 wheeled Level of assistance:  SBA Comments: antalgic pattern with decreased RLE stance, right knee flexed in stance, significantly limited in knee flexion for swing - walks with stiff LE,    TODAY'S TREATMENT [facility] Adult PT Treatment: DATE:  07/21/2022: [facility] PT educated on exercise program at [facility] with recommendation 1x/wk while in PT to augment & establish on ongoing HEP / fitness once PT discharges.  Try to allow one day between strength training with weights.  Use bike up to 20-30 min.  Leg press, knee ext & knee flex machines but do 1-2 other machines between to allow knee to recover.  Pt verbalized understanding.  Recumbent bike x 8 min level 4; seat 8 Knee extension on BATCA BLEs 20# 15 reps;  BLE concentric & single leg isometric / eccentric 10# 10 reps 1 set ea LE.  Single leg only 5# 10 reps ea LE. PT instructed in set up & recommendations for [employer]. Pt verbalized understanding. Knee flexion on BATCA BLEs 30# 15 reps;  Single leg only 15# 10 reps 2  sets ea LE. PT instructed in set up & recommendations for [employer]. Pt verbalized understanding. Leg press 125# BLEs 15 reps;  single leg 62# 10 reps 2 sets ea LE. PT instructed in set up & recommendations for [employer]. Pt verbalized understanding.    07/18/22: [employer] Recumbent bike x 8 min level 4; seat 8 Knee extension on BATCA AA concentric 5# on Rt 3x10 Seated quad set 3x10; 5 sec hold Seated SLR 2x10 - min cues to maintain TKE Rt LAQ 5# 3x10; 3 sec hold Sit to/from stand without UE support 2x10  Modalities Vasopneumatic: 34 deg, medium compression x 10 minutes on Rt knee with elevation  07/14/2022: [employer] Recumbent bike x 8 min level 3; seat 8 Gastroc stretch step heel depression 30 sec hold 2 reps BLEs heel raises reaching BUEs overhead 15 reps Knee extension LAQ 5# & active knee flexion with contralateral LE opposing motion 5 sec hold 10 reps Standing on foam RLE TKE green theraband 10 reps 1st set TKE only, 2nd set raising left heel for increased RLE WB &  balance.   Therapeutic Activities: PT reviewed using 24" bar stool to build standing tolerance. May help with the funeral standing. Pt verbalized understanding. Sit to/from stand 18" chair without UE assist 10 reps.  PT demo & verbal cues on technique. Step up & step down 6" step BUE support 15 reps. PT demo & verbal cues on technique.   PATIENT EDUCATION:  Education details: HEP review, POC, rationale for interventions Person educated: Patient Education method: Explanation, Demonstration, Verbal cues, and Handouts Education comprehension: verbalized understanding, returned demonstration, and verbal cues required  HOME EXERCISE PROGRAM: Access Code: [email or web address] URL: [email or web address] Date: 07/08/2022 Prepared by: Faustino Congress  Exercises - Ankle Alphabet in Elevation  - 2-4 x daily - 7 x weekly - 1 sets - 1 reps - Quad Setting and Stretching  - 2-4 x daily - 7 x weekly - 5-10 sets - 10 reps - prop 5-10 minutes & quad set5 seconds hold - Supine Heel Slide with Strap  - 2-3 x daily - 7 x weekly - 2-3 sets - 10 reps - 5 seconds hold - Supine Straight Leg Raises  - 2-3 x daily - 7 x weekly - 2-3 sets - 10 reps - 5 seconds hold - Hooklying Hamstring Stretch with Strap  - 2-4 x daily - 7 x weekly - 1 sets - 3 reps - 20-30 seconds hold - Supine Quadriceps Stretch with Strap on Table  - 2-4 x daily - 7 x weekly - 1 sets - 3 reps - 20-30 seconds hold - Seated Knee Flexion Extension AROM   - 2-4 x daily - 7 x weekly - 2-3 sets - 10 reps - 5 seconds hold - Seated Hamstring Stretch with Strap  - 2-4 x daily - 7 x weekly - 1 sets - 3 reps - 20-30 seconds hold - Seated Long Arc Quad  - 2 x daily - 7 x weekly - 2-3 sets - 10 reps - 5 seconds hold - squat at sink with chair behind you  - 2 x daily - 7 x weekly - 2-3 sets - 10 reps - 5 seconds hold - Gastroc Stretch on Step  - 2-4 x daily - 7 x weekly - 1 sets - 3 reps - 20-30 seconds hold - Seated Straight Leg Raise  - 4-5 x daily  - 7 x weekly - 1 sets - 5 reps - 3 sec hold  ASSESSMENT:  CLINICAL  IMPRESSION:  Today's session focused on education on weight machines for workout at [facility] starting 1x/wk while still in PT.  This allows 3rd day each week for strengthening and begins to establish an ongoing routine once PT discharges.  Pt appears to have basic understanding.    OBJECTIVE IMPAIRMENTS: Abnormal gait, decreased activity tolerance, decreased balance, decreased endurance, decreased knowledge of condition, decreased knowledge of use of DME, decreased mobility, difficulty walking, decreased ROM, decreased strength, increased edema, increased muscle spasms, postural dysfunction, and pain.   ACTIVITY LIMITATIONS: carrying, lifting, bending, sitting, standing, squatting, sleeping, stairs, transfers, and locomotion level  PARTICIPATION LIMITATIONS: meal prep, cleaning, laundry, driving, community activity, and yard work  PERSONAL FACTORS: Fitness and 3+ comorbidities: see PMH are also affecting patient's functional outcome.   REHAB POTENTIAL: Good  CLINICAL DECISION MAKING: Stable/uncomplicated  EVALUATION COMPLEXITY: Low   GOALS: Goals reviewed with patient? Yes  SHORT TERM GOALS: (target date for Short term goals 07/15/2022)   1.  Patient will demonstrate independent use of home exercise program to maintain progress from in clinic treatments.  Goal status: Met 07/01/22  2. Right knee PROM -5* ext to 95* flexion  Goal Status: Partially Met 07/06/22  LONG TERM GOALS: (target dates for all long term goals are 10 weeks  08/26/2022 )   1. Patient will demonstrate/report pain at worst less than or equal to 2/10 to facilitate minimal limitation in daily activity secondary to pain symptoms.  Goal status: New   2. Patient will demonstrate independent use of home exercise program to facilitate ability to maintain/progress functional gains from skilled physical therapy services.  Goal status: New   3. Patient will  demonstrate FOTO outcome > or = 55 % to indicate reduced disability due to condition.  Goal status: New   4.  Patient will demonstrate right knee LE MMT 5/5 throughout to faciltiate usual transfers, stairs, squatting at [facility] for daily life.   Goal status: New   5.  Patient right knee AROM 0* ext to 100* flexion. Goal status: New   6.  pt ambulates >500', negotiates ramps & curbs without device and stairs single rail modified independent.  Goal status: New   7.  pt demonstrates activities to enable to return to yard work as pt goal.  Goal Status: New   PLAN:  PT FREQUENCY: 2-3x/week  PT DURATION: 10 weeks  PLANNED INTERVENTIONS: Therapeutic exercises, Therapeutic activity, Neuromuscular re-education, Balance training, Gait training, Patient/Family education, Self Care, Joint mobilization, Stair training, Vestibular training, DME instructions, Electrical stimulation, Cryotherapy, Moist heat, scar mobilization, Taping, Vasopneumatic device, Ultrasound, Manual therapy, and Re-evaluation  PLAN FOR NEXT SESSION:  continue therapeutic exercise for strength & balance,  Quad activation, Closed chain functional exercises, vaso to end PRN    Jamey Reas, PT, DPT 07/21/2022, 11:00 AM

## 2022-07-21 NOTE — Patient Instructions (Signed)
Knee extension machine 2 legs 20# 15 reps,  10# both up, then single leg hold & down 10 reps ea leg,  then single leg 5# 10 reps ea leg. Knee flexion (hamstring curl) machine 30# both legs 15 reps,  15# single leg 10 reps 2 sets ea leg Leg press machine 125# both legs 1 set,  then 62# single leg 10 reps 2 sets.    Do 1-2 arm or trunk / abdominal machines between above knee machines to give your knees a rest.

## 2022-07-25 ENCOUNTER — Encounter: Payer: Self-pay | Admitting: Physical Therapy

## 2022-07-25 ENCOUNTER — Ambulatory Visit: Payer: PPO | Admitting: Physical Therapy

## 2022-07-25 DIAGNOSIS — M25561 Pain in right knee: Secondary | ICD-10-CM

## 2022-07-25 DIAGNOSIS — R2681 Unsteadiness on feet: Secondary | ICD-10-CM

## 2022-07-25 DIAGNOSIS — M6281 Muscle weakness (generalized): Secondary | ICD-10-CM

## 2022-07-25 DIAGNOSIS — R600 Localized edema: Secondary | ICD-10-CM | POA: Diagnosis not present

## 2022-07-25 DIAGNOSIS — R2689 Other abnormalities of gait and mobility: Secondary | ICD-10-CM | POA: Diagnosis not present

## 2022-07-25 DIAGNOSIS — M25661 Stiffness of right knee, not elsewhere classified: Secondary | ICD-10-CM

## 2022-07-25 NOTE — Therapy (Signed)
OUTPATIENT PHYSICAL THERAPY TREATMENT NOTE   Patient Name: Bryan Hart MRN: [id number] DOB:[birth year], 72 y.o., male Today's Date: 07/25/2022    END OF SESSION:   PT End of Session - 07/25/22 1016     Visit Number 13    Number of Visits 25    Date for PT Re-Evaluation 08/26/22    Authorization Type [employer]    Authorization Time Period $25 co-pay    Progress Note Due on Visit 38    PT Start Time 1015    PT Stop Time 1059    PT Time Calculation (min) 44 min    Activity Tolerance Patient tolerated treatment well;No increased pain    Behavior During Therapy [facility] for tasks assessed/performed                 Past Medical History:  Diagnosis Date   Arthritis    HLD (hyperlipidemia)    Hypertension    dr  ed green      stress test   12 yrs ago   PVC's (premature ventricular contractions) 12 years ago   stress test done   Sleep apnea    Stroke [facility])    minor stroke 01/2018   Past Surgical History:  Procedure Laterality Date   ANKLE ARTHROSCOPY  06/27/2008   JOINT REPLACEMENT  06/27/2009   rt shoulder rotator cuff repair   knee surgeries  [id number]   x2   SHOULDER HEMI-ARTHROPLASTY  01/12/2012   Procedure: SHOULDER HEMI-ARTHROPLASTY;  Surgeon: Nita Sells, MD;  Location: [place];  Service: Orthopedics;  Laterality: Right;   SHOULDER SURGERY Right 07/2021   TOTAL KNEE ARTHROPLASTY Right 05/31/2022   Procedure: RIGHT TOTAL KNEE ARTHROPLASTY;  Surgeon: Mcarthur Rossetti, MD;  Location: [place];  Service: Orthopedics;  Laterality: Right;   Patient Active Problem List   Diagnosis Date Noted   Status post total right knee replacement 05/31/2022   Unilateral primary osteoarthritis, left knee 03/28/2022   Unilateral primary osteoarthritis, right knee 03/28/2022   Primary osteoarthritis of left knee 04/30/2020   Effusion, right knee 03/31/2020   Degenerative arthritis of right knee 03/31/2020   Bilateral primary osteoarthritis of knee 12/27/2018   AV  block 07/29/2018   Central sleep apnea 07/29/2018   Cerebrovascular accident ([place]) 07/29/2018   Primary localized osteoarthrosis, shoulder region 01/13/2012     THERAPY DIAG:  Acute pain of right knee  Muscle weakness (generalized)  Stiffness of right knee, not elsewhere classified  Localized edema  Other abnormalities of gait and mobility  Unsteadiness on feet   PCP: Sueanne Margarita, DO  REFERRING PROVIDER: Jean Rosenthal, MD  REFERRING DIAG:  M17.11 (ICD-10-CM) - Unilateral primary osteoarthritis, right knee  Z96.651 (ICD-10-CM) - Status post total right knee replacement    EVAL THERAPY DIAG:  Acute pain of right knee  Muscle weakness (generalized)  Stiffness of right knee, not elsewhere classified  Localized edema  Other abnormalities of gait and mobility  Unsteadiness on feet  Rationale for Evaluation and Treatment: Rehabilitation  ONSET DATE:  05/31/2022 Right TKA  SUBJECTIVE:   SUBJECTIVE STATEMENT: He went to [facility] Saturday.  He did seated Ellipitical for 30 min no resistance.  He tried leg press with no weight but was not able to control.    His biggest problem is stairs.   PERTINENT HISTORY: OA, Right shoulder hemiarthroplasty, AV block, CVA 01/2018, HTN  PAIN:  Are you having pain? Yes: NPRS scale: today 1/10 and in last week 0/10 - 4/10 (couple of  incidental things) Pain location: right knee anterior and lateral Pain description: sore  Aggravating factors: twisting / turning Relieving factors: sit down, massage, meds, ice  PRECAUTIONS: None  WEIGHT BEARING RESTRICTIONS: No  FALLS:  Has patient fallen in last 6 months? No    LIVING ENVIRONMENT: Lives with: lives with their spouse Lives in: [place] 2-story half bath downstairs, bedrooms & bathrooms are upstairs. Stairs: Yes: Internal: 13 steps; on right going up and External: 1 steps; none Has following equipment at home: Single point cane, Walker - 2 wheeled, shower chair, and Grab  bars  OCCUPATION: retired   PLOF: Independent, Bryan with household mobility without device, and Independent with community mobility without device  PATIENT GOALS:   function without pain, improve walking, yard work  NEXT MD VISIT: 07/13/2022  OBJECTIVE: (all measures taken from initial evaluation unless otherwise dated)  DIAGNOSTIC FINDINGS: 05/31/2022 post-op X-ray Evidence of patient's recent right total knee replacement with prosthetic components intact and normally located. Mild air and fluid within the knee joint. Skin staples over the anterior soft tissues vertically just left of midline.  PATIENT SURVEYS:  EVAL: FOTO intake: 47%   predicted:  55% 07/18/22: FOTO 64  EDEMA:  EVAL: RLE: above knee 49.8cm  around knee 48.9cm below knee  42.8cm LLE:  above knee 41.8cm  around knee 44cm  below knee  35cm  POSTURE: rounded shoulders, forward head, flexed trunk , and weight shift left  PALPATION: Tenderness along incision, patella borders, joint line, distal quads and proximal gastroc.   LOWER EXTREMITY ROM:   ROM Right eval Right 06/22/22 Right 06/29/22 Right 07/01/22 Right 07/06/22 Right 07/11/22 Right 07/18/22  Knee flexion Seated A: 83* P: 86* Supine A: 105 P: 112 Seated P: 115* A: 107* Seated A: 110 P: 118 Supine:  A: 113 Supine A:115*   Knee extension Supine: P: -11* A: quad set -14* Seated A: LAQ -34* A: Seated LAQ  -14 P: Supine heel prop -8 Seated  A: LAQ -12* Standing A: -8* Seated A: -12 P: -10 Seated A: -12  Supine A: -10* Seated LAQ: -7   (Blank rows = not tested)  LOWER EXTREMITY MMT:  MMT Right eval Left 07/14/2022 Right 07/14/2022  Knee flexion 3-/5 HH Dynameter 24.1#, 23.7# HH Dynameter 23.2#, 21.8# RLE:LLE 94%  Knee extension 3-/5 HH Dynameter 36.5#, 37.6# HH Dynameter 31.8#, 37.9# RLE:LLE  94%   (Blank rows = not tested)  FUNCTIONAL TESTS:  18 inch chair transfer: requires use of UEs on armrests and RW to  stabilize  GAIT: 1:15/2024: pt amb without assistive device >200' safely.   06/14/2022: Distance walked: 100' Assistive device utilized: Environmental consultant - 2 wheeled Level of assistance: SBA Comments: antalgic pattern with decreased RLE stance, right knee flexed in stance, significantly limited in knee flexion for swing - walks with stiff LE,    TODAY'S TREATMENT [facility] Adult PT Treatment: DATE:  07/25/2022: Recumbent bike x 8 min level 4; seat 8 Gastroc stretch on step heel depression 30 sec hold Knee flexion to hamstring stretch at bottom of stairs using right rail right foot on 2nd step up: wt shift forward for flexion stretch and RLE knee ext for hamstring stretch 10 sec hold 10 reps.  Knee extension on BATCA BLEs 20# 15 reps;  BLE concentric & single leg isometric / eccentric 10# 10 reps 1 set ea LE.  Single leg only 5# 10 reps ea LE. Knee flexion on BATCA BLEs 35# 15 reps;  Single leg only 15# 10 reps  2 sets ea LE. PT instructed in set up & recommendations for [facility]. Pt verbalized understanding.   Therapeutic Activities: Stairs flight of 11 steps: using right rail ascend similar to his home - descend 1st time step-to pattern pattern with RLE eccentric, 2nd & 3rd time alternating pattern with noted issues flexing LLE.  4th time step-to pattern alternating lead LE.  Ascend 4 times alternating pattern.  PT demo & verbal cues on technique.  HEP Stairs flight of steps: using right rail ascend similar to his home -  descend  step-to pattern (bringing both feet to one step)  alternating lead legs. 1 or 2 times  alternating pattern - one foot to each step.  Make sure you keep your weight coming forward especially when stepping right so left is bending.     Ascend 4 times alternating pattern.  PT demo & verbal cues on technique.     07/21/2022: [facility] PT educated on exercise program at [facility] with recommendation 1x/wk while in PT to augment & establish on ongoing HEP / fitness once PT discharges.   Try to allow one day between strength training with weights.  Use bike up to 20-30 min.  Leg press, knee ext & knee flex machines but do 1-2 other machines between to allow knee to recover.  Pt verbalized understanding.  Recumbent bike x 8 min level 4; seat 8 Knee extension on BATCA BLEs 20# 15 reps;  BLE concentric & single leg isometric / eccentric 10# 10 reps 1 set ea LE.  Single leg only 5# 10 reps ea LE. PT instructed in set up & recommendations for [facility]. Pt verbalized understanding. Knee flexion on BATCA BLEs 30# 15 reps;  Single leg only 15# 10 reps 2 sets ea LE. PT instructed in set up & recommendations for [facility]. Pt verbalized understanding. Leg press 125# BLEs 15 reps;  single leg 62# 10 reps 2 sets ea LE. PT instructed in set up & recommendations for [facility]. Pt verbalized understanding.    07/18/22: [facility]Ex Recumbent bike x 8 min level 4; seat 8 Knee extension on BATCA AA concentric 5# on Rt 3x10 Seated quad set 3x10; 5 sec hold Seated SLR 2x10 - min cues to maintain TKE Rt LAQ 5# 3x10; 3 sec hold Sit to/from stand without UE support 2x10  Modalities Vasopneumatic: 34 deg, medium compression x 10 minutes on Rt knee with elevation   PATIENT EDUCATION:  Education details: HEP review, POC, rationale for interventions Person educated: Patient Education method: Consulting civil engineer, Demonstration, Verbal cues, and Handouts Education comprehension: verbalized understanding, returned demonstration, and verbal cues required  HOME EXERCISE PROGRAM: Access Code: [email or web address] URL: [email or web address] Date: 07/08/2022 Prepared by: Faustino Congress  Exercises - Ankle Alphabet in Elevation  - 2-4 x daily - 7 x weekly - 1 sets - 1 reps - Quad Setting and Stretching  - 2-4 x daily - 7 x weekly - 5-10 sets - 10 reps - prop 5-10 minutes & quad set5 seconds hold - Supine Heel Slide with Strap  - 2-3 x daily - 7 x weekly - 2-3 sets - 10 reps - 5 seconds hold - Supine Straight Leg Raises   - 2-3 x daily - 7 x weekly - 2-3 sets - 10 reps - 5 seconds hold - Hooklying Hamstring Stretch with Strap  - 2-4 x daily - 7 x weekly - 1 sets - 3 reps - 20-30 seconds hold - Supine Quadriceps Stretch with Strap on Table  - 2-4 x daily - 7  x weekly - 1 sets - 3 reps - 20-30 seconds hold - Seated Knee Flexion Extension AROM   - 2-4 x daily - 7 x weekly - 2-3 sets - 10 reps - 5 seconds hold - Seated Hamstring Stretch with Strap  - 2-4 x daily - 7 x weekly - 1 sets - 3 reps - 20-30 seconds hold - Seated Long Arc Quad  - 2 x daily - 7 x weekly - 2-3 sets - 10 reps - 5 seconds hold - squat at sink with chair behind you  - 2 x daily - 7 x weekly - 2-3 sets - 10 reps - 5 seconds hold - Gastroc Stretch on Step  - 2-4 x daily - 7 x weekly - 1 sets - 3 reps - 20-30 seconds hold - Seated Straight Leg Raise  - 4-5 x daily - 7 x weekly - 1 sets - 5 reps - 3 sec hold  ASSESSMENT:  CLINICAL IMPRESSION:  Patient improved ability to negotiate stairs with PT instruction & repetition.  He needs repetition to understand exercises & activities.   OBJECTIVE IMPAIRMENTS: Abnormal gait, decreased activity tolerance, decreased balance, decreased endurance, decreased knowledge of condition, decreased knowledge of use of DME, decreased mobility, difficulty walking, decreased ROM, decreased strength, increased edema, increased muscle spasms, postural dysfunction, and pain.   ACTIVITY LIMITATIONS: carrying, lifting, bending, sitting, standing, squatting, sleeping, stairs, transfers, and locomotion level  PARTICIPATION LIMITATIONS: meal prep, cleaning, laundry, driving, community activity, and yard work  PERSONAL FACTORS: Fitness and 3+ comorbidities: see PMH are also affecting patient's functional outcome.   REHAB POTENTIAL: Good  CLINICAL DECISION MAKING: Stable/uncomplicated  EVALUATION COMPLEXITY: Low   GOALS: Goals reviewed with patient? Yes  SHORT TERM GOALS: (target date for Short term goals 07/15/2022)    1.  Patient will demonstrate independent use of home exercise program to maintain progress from in clinic treatments.  Goal status: Met 07/01/22  2. Right knee PROM -5* ext to 95* flexion  Goal Status: Partially Met 07/06/22  LONG TERM GOALS: (target dates for all long term goals are 10 weeks  08/26/2022 )   1. Patient will demonstrate/report pain at worst less than or equal to 2/10 to facilitate minimal limitation in daily activity secondary to pain symptoms.  Goal status: New   2. Patient will demonstrate independent use of home exercise program to facilitate ability to maintain/progress functional gains from skilled physical therapy services.  Goal status: New   3. Patient will demonstrate FOTO outcome > or = 55 % to indicate reduced disability due to condition.  Goal status: New   4.  Patient will demonstrate right knee LE MMT 5/5 throughout to faciltiate usual transfers, stairs, squatting at [facility] for daily life.   Goal status: New   5.  Patient right knee AROM 0* ext to 100* flexion. Goal status: New   6.  pt ambulates >500', negotiates ramps & curbs without device and stairs single rail modified independent.  Goal status: New   7.  pt demonstrates activities to enable to return to yard work as pt goal.  Goal Status: New   PLAN:  PT FREQUENCY: 2-3x/week  PT DURATION: 10 weeks  PLANNED INTERVENTIONS: Therapeutic exercises, Therapeutic activity, Neuromuscular re-education, Balance training, Gait training, Patient/Family education, Self Care, Joint mobilization, Stair training, Vestibular training, DME instructions, Electrical stimulation, Cryotherapy, Moist heat, scar mobilization, Taping, Vasopneumatic device, Ultrasound, Manual therapy, and Re-evaluation  PLAN FOR NEXT SESSION:  measure knee range,  continue therapeutic exercise  for strength & balance,  Quad activation, Closed chain functional exercises, vaso to end PRN    Jamey Reas, PT, DPT 07/25/2022, 12:33 PM

## 2022-07-25 NOTE — Patient Instructions (Addendum)
Stairs flight of steps: using right rail ascend similar to his home -  descend  step-to pattern (bringing both feet to one step)  alternating lead legs. 1 or 2 times  alternating pattern - one foot to each step.  Make sure you keep your weight coming forward especially when stepping right so left is bending.     Ascend 4 times alternating pattern.  PT demo & verbal cues on technique.    Exercises at bottom: Stretch calf muscle. Stand on bottom step with one foot back so balls of toes at edge. Push heel downward with knee straight 30 sec hold 3 reps.  Do on both legs. Stand on floor & place arch of right foot at edge of 2nd step:  shift forward bending right knee 10 sec hold, then shift back straightening right knee & lean trunk forward to stretch hamstring 10 seconds.  Do 5 times on each leg.

## 2022-07-26 ENCOUNTER — Ambulatory Visit (INDEPENDENT_AMBULATORY_CARE_PROVIDER_SITE_OTHER): Payer: PPO

## 2022-07-26 ENCOUNTER — Encounter: Payer: Self-pay | Admitting: Cardiovascular Disease

## 2022-07-26 ENCOUNTER — Ambulatory Visit: Payer: PPO | Attending: Cardiovascular Disease | Admitting: Cardiovascular Disease

## 2022-07-26 VITALS — BP 124/72 | HR 63 | Ht 72.0 in | Wt 176.8 lb

## 2022-07-26 DIAGNOSIS — I10 Essential (primary) hypertension: Secondary | ICD-10-CM | POA: Insufficient documentation

## 2022-07-26 DIAGNOSIS — I493 Ventricular premature depolarization: Secondary | ICD-10-CM | POA: Diagnosis not present

## 2022-07-26 DIAGNOSIS — E785 Hyperlipidemia, unspecified: Secondary | ICD-10-CM | POA: Insufficient documentation

## 2022-07-26 DIAGNOSIS — Z8249 Family history of ischemic heart disease and other diseases of the circulatory system: Secondary | ICD-10-CM

## 2022-07-26 DIAGNOSIS — E782 Mixed hyperlipidemia: Secondary | ICD-10-CM | POA: Diagnosis not present

## 2022-07-26 NOTE — Assessment & Plan Note (Signed)
History of hyperlipidemia on statin therapy with lipid profile performed 06/28/2022 revealing total cholesterol 126, LDL 69 and HDL of 48.

## 2022-07-26 NOTE — Assessment & Plan Note (Signed)
Patient was referred for asymptomatic PVCs.  I am going get a 2-week Zio patch to further evaluate.  He did have a 2D echo performed back in 2019 which was essentially normal.

## 2022-07-26 NOTE — Patient Instructions (Signed)
Medication Instructions:  Your physician recommends that you continue on your current medications as directed. Please refer to the Current Medication list given to you today.  *If you need a refill on your cardiac medications before your next appointment, please call your pharmacy*   Testing/Procedures:  [place] Monitor Instructions  Your physician has requested you wear a ZIO patch monitor for 14 days.  This is a single patch monitor. [employer] supplies one patch monitor per enrollment. Additional stickers are not available. Please do not apply patch if you will be having a Nuclear Stress Test,  Echocardiogram, Cardiac CT, MRI, or Chest Xray during the period you would be wearing the  monitor. The patch cannot be worn during these tests. You cannot remove and re-apply the  ZIO XT patch monitor.  Your ZIO patch monitor will be mailed 3 day USPS to your address on file. It may take 3-5 days  to receive your monitor after you have been enrolled.  Once you have received your monitor, please review the enclosed instructions. Your monitor  has already been registered assigning a specific monitor serial # to you.  Billing and Patient Assistance Program Information  We have supplied [employer] with any of your insurance information on file for billing purposes. [employer] offers a sliding scale Patient Assistance Program for patients that do not have  insurance, or whose insurance does not completely cover the cost of the ZIO monitor.  You must apply for the Patient Assistance Program to qualify for this discounted rate.  To apply, please call [employer] at [phone], select option 4, select option 2, ask to apply for  Patient Assistance Program. Theodore Demark will ask your household income, and how many people  are in your household. They will quote your out-of-pocket cost based on that information.  [employer] will also be able to set up a 74-month interest-free payment plan if needed.  Applying  the monitor   Shave hair from upper left chest.  Hold abrader disc by orange tab. Rub abrader in 40 strokes over the upper left chest as  indicated in your monitor instructions.  Clean area with 4 enclosed alcohol pads. Let dry.  Apply patch as indicated in monitor instructions. Patch will be placed under collarbone on left  side of chest with arrow pointing upward.  Rub patch adhesive wings for 2 minutes. Remove white label marked "1". Remove the white  label marked "2". Rub patch adhesive wings for 2 additional minutes.  While looking in a mirror, press and release button in center of patch. A small green light will  flash 3-4 times. This will be your only indicator that the monitor has been turned on.  Do not shower for the first 24 hours. You may shower after the first 24 hours.  Press the button if you feel a symptom. You will hear a small click. Record Date, Time and  Symptom in the Patient Logbook.  When you are ready to remove the patch, follow instructions on the last 2 pages of Patient  Logbook. Stick patch monitor onto the last page of Patient Logbook.  Place Patient Logbook in the blue and white box. Use locking tab on box and tape box closed  securely. The blue and white box has prepaid postage on it. Please place it in the mailbox as  soon as possible. Your physician should have your test results approximately 7 days after the  [facility]  Call [employer]  [facility] at [phone] if you have questions regarding  your ZIO XT patch monitor. Call them immediately if you see an orange light blinking on your  monitor.  If your monitor falls off in less than 4 days, contact our Monitor department at [phone].  If your monitor becomes loose or falls off after 4 days call [employer] at [phone] for  suggestions on securing your monitor    Follow-Up: At [facility], you and your health needs are our priority.  As part  of our continuing mission to provide you with exceptional heart care, we have created designated Provider Care Teams.  These Care Teams include your primary Cardiologist (physician) and Advanced Practice Providers (APPs -  Physician Assistants and Nurse Practitioners) who all work together to provide you with the care you need, when you need it.  We recommend signing up for the patient portal called "MyChart".  Sign up information is provided on this After Visit Summary.  MyChart is used to connect with patients for Virtual Visits (Telemedicine).  Patients are able to view lab/test results, encounter notes, upcoming appointments, etc.  Non-urgent messages can be sent to your provider as well.   To learn more about what you can do with MyChart, go to [email or web address].    Your next appointment:   We will see you on an as needed basis.  Provider:   Quay Burow, MD

## 2022-07-26 NOTE — Progress Notes (Signed)
07/26/2022 Bryan Hart   13-May-1951  [id number]  Primary Physician Sueanne Margarita, DO Primary Cardiologist: Lorretta Harp MD Bryan Hart, Bryan Hart  HPI:  Bryan Hart is a 72 y.o. 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  He was referred by Dr. Francesco Sor for evaluation of asymptomatic PVCs.  His risk factors include treated hypertension and hyperlipidemia.  He has a strong family history of heart disease with a father who had a myocardial infarction at age 74, brother who had bypass surgery at 27 and another brother had a myocardial infarction.  He has never had a heart attack but has had a remote TIA.  He is active and works out at [employer] 3-4 times a week on the elliptical.  He denies chest pain or shortness of breath.  He does have a history of obstructive sleep apnea intolerant to CPAP.  He has had unexplained weight loss as well.  He does have asymptomatic PVCs.     Current Meds  Medication Sig   aspirin EC 81 MG tablet Take 81 mg by mouth daily. Swallow whole.   atorvastatin (LIPITOR) 20 MG tablet Take 20 mg by mouth daily at 6 PM.   methocarbamol (ROBAXIN) 500 MG tablet Take 1 tablet (500 mg total) by mouth every 6 (six) hours as needed for muscle spasms.   verapamil (VERELAN PM) 180 MG 24 hr capsule Take 180 mg by mouth daily.     Allergies  Allergen Reactions   Adhesive [Tape] Rash    Social History   Socioeconomic History   Marital status: Married    Spouse name: [name]   Number of children: Not on file   Years of education: Not on file   Highest education level: Master's degree (e.g., MA, MS, MEng, MEd, MSW, MBA)  Occupational History   Not on file  Tobacco Use   Smoking status: Former    Packs/day: 0.50    Years: 4.00    Total pack years: 2.00    Types: Cigarettes    Quit date: 1975    Years since quitting: 49.1   Smokeless tobacco: Never   Vaping Use   Vaping Use: Never used  Substance and Sexual Activity   Alcohol use: Yes    Alcohol/week: 1.0 - 2.0 standard drink of alcohol    Types: 1 - 2 Cans of beer per week    Comment: daily   Drug use: No   Sexual activity: Not on file  Other Topics Concern   Not on file  Social History Narrative   Lives home with spouse [name].   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   Left handed    Social Determinants of Health   Financial Resource Strain: Not on file  Food Insecurity: Not on file  Transportation Needs: Not on file  Physical Activity: Not on file  Stress: Not on file  Social Connections: Not on file  Intimate Partner Violence: Not on file     Review of Systems: General: negative for chills, fever, night sweats or weight changes.  Cardiovascular: negative for chest pain, dyspnea on exertion, edema, orthopnea, palpitations, paroxysmal nocturnal dyspnea or shortness of breath Dermatological: negative for rash Respiratory: negative for cough or wheezing Urologic: negative for hematuria Abdominal: negative for nausea, vomiting, diarrhea, bright red blood per rectum, melena, or hematemesis Neurologic: negative for visual changes, syncope, or dizziness All  other systems reviewed and are otherwise negative except as noted above.    Blood pressure 124/72, pulse 63, height 6' (1.829 m), weight 176 lb 12.8 oz (80.2 kg), SpO2 100 %.  General appearance: alert and no distress Neck: no adenopathy, no carotid bruit, no JVD, supple, symmetrical, trachea midline, and thyroid not enlarged, symmetric, no tenderness/mass/nodules Lungs: clear to auscultation bilaterally Heart: regular rate and rhythm, S1, S2 normal, no murmur, click, rub or gallop Extremities: extremities normal, atraumatic, no cyanosis or edema Pulses: 2+ and symmetric Skin: Skin color, texture, turgor normal. No rashes or lesions Neurologic: Grossly normal  EKG sinus rhythm at 63 without ST or T wave changes.  I  personally reviewed this EKG.  ASSESSMENT AND PLAN:   Asymptomatic PVCs Patient was referred for asymptomatic PVCs.  I am going get a 2-week Zio patch to further evaluate.  He did have a 2D echo performed back in 2019 which was essentially normal.  Hyperlipidemia History of hyperlipidemia on statin therapy with lipid profile performed 06/28/2022 revealing total cholesterol 126, LDL 69 and HDL of 48.  Family history of heart disease Strong family history of heart disease with father who had a myocardial infarction at age 24, brother had CABG at age 71 and another brother who had a myocardial infarction.  Essential hypertension History of essential hypertension blood pressure measured today at 124/72.  He is on verapamil.     Lorretta Harp MD FACP,FACC,FAHA, [facility] 07/26/2022 11:44 AM

## 2022-07-26 NOTE — Progress Notes (Unsigned)
Enrolled for [employer] to mail a ZIO XT long term holter monitor to the patients address on file.  

## 2022-07-26 NOTE — Assessment & Plan Note (Signed)
History of essential hypertension blood pressure measured today at 124/72.  He is on verapamil.

## 2022-07-26 NOTE — Assessment & Plan Note (Signed)
Strong family history of heart disease with father who had a myocardial infarction at age 74, brother had CABG at age 26 and another brother who had a myocardial infarction.

## 2022-07-27 DIAGNOSIS — D225 Melanocytic nevi of trunk: Secondary | ICD-10-CM | POA: Diagnosis not present

## 2022-07-27 DIAGNOSIS — H04123 Dry eye syndrome of bilateral lacrimal glands: Secondary | ICD-10-CM | POA: Diagnosis not present

## 2022-07-27 DIAGNOSIS — D2262 Melanocytic nevi of left upper limb, including shoulder: Secondary | ICD-10-CM | POA: Diagnosis not present

## 2022-07-27 DIAGNOSIS — H52203 Unspecified astigmatism, bilateral: Secondary | ICD-10-CM | POA: Diagnosis not present

## 2022-07-27 DIAGNOSIS — H2513 Age-related nuclear cataract, bilateral: Secondary | ICD-10-CM | POA: Diagnosis not present

## 2022-07-27 DIAGNOSIS — D1801 Hemangioma of skin and subcutaneous tissue: Secondary | ICD-10-CM | POA: Diagnosis not present

## 2022-07-27 DIAGNOSIS — H43813 Vitreous degeneration, bilateral: Secondary | ICD-10-CM | POA: Diagnosis not present

## 2022-07-27 DIAGNOSIS — L821 Other seborrheic keratosis: Secondary | ICD-10-CM | POA: Diagnosis not present

## 2022-07-27 DIAGNOSIS — H524 Presbyopia: Secondary | ICD-10-CM | POA: Diagnosis not present

## 2022-07-27 DIAGNOSIS — H25013 Cortical age-related cataract, bilateral: Secondary | ICD-10-CM | POA: Diagnosis not present

## 2022-07-27 DIAGNOSIS — D2362 Other benign neoplasm of skin of left upper limb, including shoulder: Secondary | ICD-10-CM | POA: Diagnosis not present

## 2022-07-27 DIAGNOSIS — H5203 Hypermetropia, bilateral: Secondary | ICD-10-CM | POA: Diagnosis not present

## 2022-07-27 DIAGNOSIS — L814 Other melanin hyperpigmentation: Secondary | ICD-10-CM | POA: Diagnosis not present

## 2022-07-27 DIAGNOSIS — L905 Scar conditions and fibrosis of skin: Secondary | ICD-10-CM | POA: Diagnosis not present

## 2022-07-27 DIAGNOSIS — D2239 Melanocytic nevi of other parts of face: Secondary | ICD-10-CM | POA: Diagnosis not present

## 2022-07-28 ENCOUNTER — Encounter: Payer: Self-pay | Admitting: Physical Therapy

## 2022-07-28 ENCOUNTER — Ambulatory Visit: Payer: PPO | Admitting: Physical Therapy

## 2022-07-28 DIAGNOSIS — M25661 Stiffness of right knee, not elsewhere classified: Secondary | ICD-10-CM

## 2022-07-28 DIAGNOSIS — R600 Localized edema: Secondary | ICD-10-CM

## 2022-07-28 DIAGNOSIS — M25561 Pain in right knee: Secondary | ICD-10-CM

## 2022-07-28 DIAGNOSIS — R2689 Other abnormalities of gait and mobility: Secondary | ICD-10-CM

## 2022-07-28 DIAGNOSIS — M6281 Muscle weakness (generalized): Secondary | ICD-10-CM

## 2022-07-28 DIAGNOSIS — I493 Ventricular premature depolarization: Secondary | ICD-10-CM | POA: Diagnosis not present

## 2022-07-28 DIAGNOSIS — R2681 Unsteadiness on feet: Secondary | ICD-10-CM | POA: Diagnosis not present

## 2022-07-28 NOTE — Therapy (Signed)
OUTPATIENT PHYSICAL THERAPY TREATMENT NOTE   Patient Name: Bryan Hart MRN: [id number] DOB:March 09, 1951, 72 y.o., male Today's Date: 07/28/2022    END OF SESSION:   PT End of Session - 07/28/22 1014     Visit Number 14    Number of Visits 25    Date for PT Re-Evaluation 08/26/22    Authorization Type [employer]    Authorization Time Period $25 co-pay    Progress Note Due on Visit 102    PT Start Time 8338    PT Stop Time 1058    PT Time Calculation (min) 44 min    Activity Tolerance Patient tolerated treatment well;No increased pain    Behavior During Therapy [facility] for tasks assessed/performed                  Past Medical History:  Diagnosis Date   Arthritis    HLD (hyperlipidemia)    Hypertension    dr  ed green      stress test   12 yrs ago   PVC's (premature ventricular contractions) 12 years ago   stress test done   Sleep apnea    Stroke [facility])    minor stroke 01/2018   Past Surgical History:  Procedure Laterality Date   ANKLE ARTHROSCOPY  06/27/2008   JOINT REPLACEMENT  06/27/2009   rt shoulder rotator cuff repair   knee surgeries  [id number]   x2   SHOULDER HEMI-ARTHROPLASTY  01/12/2012   Procedure: SHOULDER HEMI-ARTHROPLASTY;  Surgeon: Nita Sells, MD;  Location: [place];  Service: Orthopedics;  Laterality: Right;   SHOULDER SURGERY Right 07/2021   TOTAL KNEE ARTHROPLASTY Right 05/31/2022   Procedure: RIGHT TOTAL KNEE ARTHROPLASTY;  Surgeon: Mcarthur Rossetti, MD;  Location: [place];  Service: Orthopedics;  Laterality: Right;   Patient Active Problem List   Diagnosis Date Noted   Asymptomatic PVCs 07/26/2022   Hyperlipidemia 07/26/2022   Family history of heart disease 07/26/2022   Essential hypertension 07/26/2022   Status post total right knee replacement 05/31/2022   Unilateral primary osteoarthritis, left knee 03/28/2022   Unilateral primary osteoarthritis, right knee 03/28/2022   Primary osteoarthritis of left knee 04/30/2020    Effusion, right knee 03/31/2020   Degenerative arthritis of right knee 03/31/2020   Bilateral primary osteoarthritis of knee 12/27/2018   AV block 07/29/2018   Central sleep apnea 07/29/2018   Cerebrovascular accident ([place]) 07/29/2018   Primary localized osteoarthrosis, shoulder region 01/13/2012     THERAPY DIAG:  Acute pain of right knee  Muscle weakness (generalized)  Stiffness of right knee, not elsewhere classified  Other abnormalities of gait and mobility  Unsteadiness on feet  Localized edema   PCP: Sueanne Margarita, DO  REFERRING PROVIDER: Jean Rosenthal, MD  REFERRING DIAG:  M17.11 (ICD-10-CM) - Unilateral primary osteoarthritis, right knee  Z96.651 (ICD-10-CM) - Status post total right knee replacement    EVAL THERAPY DIAG:  Acute pain of right knee  Muscle weakness (generalized)  Stiffness of right knee, not elsewhere classified  Localized edema  Other abnormalities of gait and mobility  Unsteadiness on feet  Rationale for Evaluation and Treatment: Rehabilitation  ONSET DATE:  05/31/2022 Right TKA  SUBJECTIVE:   SUBJECTIVE STATEMENT: He tried stairs but got confused.   PERTINENT HISTORY: OA, Right shoulder hemiarthroplasty, AV block, CVA 01/2018, HTN  PAIN:  Are you having pain? Yes: NPRS scale: today 1/10 and in last week 0/10 - 4/10 (couple of incidental things) Pain location: right knee anterior and  lateral Pain description: sore  Aggravating factors: twisting / turning Relieving factors: sit down, massage, meds, ice  PRECAUTIONS: None  WEIGHT BEARING RESTRICTIONS: No  FALLS:  Has patient fallen in last 6 months? No    LIVING ENVIRONMENT: Lives with: lives with their spouse Lives in: [place] 2-story half bath downstairs, bedrooms & bathrooms are upstairs. Stairs: Yes: Internal: 13 steps; on right going up and External: 1 steps; none Has following equipment at home: Single point cane, Walker - 2 wheeled, shower chair, and Grab  bars  OCCUPATION: retired   PLOF: Independent, [place] with household mobility without device, and Independent with community mobility without device  PATIENT GOALS:   function without pain, improve walking, yard work  NEXT MD VISIT: 07/13/2022  OBJECTIVE: (all measures taken from initial evaluation unless otherwise dated)  DIAGNOSTIC FINDINGS: 05/31/2022 post-op X-ray Evidence of patient's recent right total knee replacement with prosthetic components intact and normally located. Mild air and fluid within the knee joint. Skin staples over the anterior soft tissues vertically just left of midline.  PATIENT SURVEYS:  EVAL: FOTO intake: 47%   predicted:  55% 07/18/22: FOTO 64  EDEMA:  EVAL: RLE: above knee 49.8cm  around knee 48.9cm below knee  42.8cm LLE:  above knee 41.8cm  around knee 44cm  below knee  35cm  POSTURE: rounded shoulders, forward head, flexed trunk , and weight shift left  PALPATION: Tenderness along incision, patella borders, joint line, distal quads and proximal gastroc.   LOWER EXTREMITY ROM:   ROM Right eval Right 06/22/22 Right 06/29/22 Right 07/01/22 Right 07/06/22 Right 07/11/22 Right 07/18/22 Right 07/28/22  Knee flexion Seated A: 83* P: 86* Supine A: 105 P: 112 Seated P: 115* A: 107* Seated A: 110 P: 118 Supine:  A: 113 Supine A:115*    Knee extension Supine: P: -11* A: quad set -14* Seated A: LAQ -34* A: Seated LAQ  -14 P: Supine heel prop -8 Seated  A: LAQ -12* Standing A: -8* Seated A: -12 P: -10 Seated A: -12  Supine A: -10* Seated LAQ: -7 Seated LAQ -6*   (Blank rows = not tested)  LOWER EXTREMITY MMT:  MMT Right eval Left 07/14/2022 Right 07/14/2022  Knee flexion 3-/5 HH Dynameter 24.1#, 23.7# HH Dynameter 23.2#, 21.8# RLE:LLE 94%  Knee extension 3-/5 HH Dynameter 36.5#, 37.6# HH Dynameter 31.8#, 37.9# RLE:LLE  94%   (Blank rows = not tested)  FUNCTIONAL TESTS:  18 inch chair transfer: requires use of UEs on  armrests and RW to stabilize  GAIT: 1:15/2024: pt amb without assistive device >200' safely.   06/14/2022: Distance walked: 100' Assistive device utilized: Environmental consultant - 2 wheeled Level of assistance: SBA Comments: antalgic pattern with decreased RLE stance, right knee flexed in stance, significantly limited in knee flexion for swing - walks with stiff LE,    TODAY'S TREATMENT [facility] Adult PT Treatment: DATE:  07/28/2022: Therapeutic Exercise: Recumbent bike x 8 min level 4; seat 8 Gastroc stretch on step heel depression 30 sec hold Knee flexion to hamstring stretch at bottom of stairs using right rail right foot on 2nd step up: wt shift forward for flexion stretch and RLE knee ext for hamstring stretch 10 sec hold 10 reps.   Therapeutic Activities: Stairs flight of 11 steps: using left rail ascend similar to his home - descend 1st time step-to pattern pattern with RLE eccentric, 2nd time alternating with 2 rails. 3rd time alternating lead LEs but continuing step-to with single rail.   PT demo &  verbal cues on technique.   Ascend with left rail reciprocal pattern with cues on wt shift & knee motion. 3 flights.  PT updated written exercises for stairs & above stretches as HEP.  See pt instructions.  Pt appears to understand better & verbalizes understanding.  He needs directions repeated multiple times for comprehension.    Neuromuscular Re-education:  Tandem stance on foam beam:  30sec ea LE in front 2 reps with intermittent touch required. Slider to 3 cones (ant-lat, lat & post-lat) with stance knee flexion on outward motion & extension inward motion.  He requires UE assist. 5 reps ea LE.      07/25/2022: Recumbent bike x 8 min level 4; seat 8 Gastroc stretch on step heel depression 30 sec hold Knee flexion to hamstring stretch at bottom of stairs using right rail right foot on 2nd step up: wt shift forward for flexion stretch and RLE knee ext for hamstring stretch 10 sec hold 10 reps.  Knee  extension on BATCA BLEs 20# 15 reps;  BLE concentric & single leg isometric / eccentric 10# 10 reps 1 set ea LE.  Single leg only 5# 10 reps ea LE. Knee flexion on BATCA BLEs 35# 15 reps;  Single leg only 15# 10 reps 2 sets ea LE. PT instructed in set up & recommendations for [facility]. Pt verbalized understanding.   Therapeutic Activities: Stairs flight of 11 steps: using right rail ascend similar to his home - descend 1st time step-to pattern pattern with RLE eccentric, 2nd & 3rd time alternating pattern with noted issues flexing LLE.  4th time step-to pattern alternating lead LE.  Ascend 4 times alternating pattern.  PT demo & verbal cues on technique.  HEP Stairs flight of steps: using right rail ascend similar to his home -  descend  step-to pattern (bringing both feet to one step)  alternating lead legs. 1 or 2 times  alternating pattern - one foot to each step.  Make sure you keep your weight coming forward especially when stepping right so left is bending.     Ascend 4 times alternating pattern.  PT demo & verbal cues on technique.   07/21/2022: [facility] PT educated on exercise program at [facility] with recommendation 1x/wk while in PT to augment & establish on ongoing HEP / fitness once PT discharges.  Try to allow one day between strength training with weights.  Use bike up to 20-30 min.  Leg press, knee ext & knee flex machines but do 1-2 other machines between to allow knee to recover.  Pt verbalized understanding.  Recumbent bike x 8 min level 4; seat 8 Knee extension on BATCA BLEs 20# 15 reps;  BLE concentric & single leg isometric / eccentric 10# 10 reps 1 set ea LE.  Single leg only 5# 10 reps ea LE. PT instructed in set up & recommendations for [facility]. Pt verbalized understanding. Knee flexion on BATCA BLEs 30# 15 reps;  Single leg only 15# 10 reps 2 sets ea LE. PT instructed in set up & recommendations for [facility]. Pt verbalized understanding. Leg press 125# BLEs 15 reps;  single leg 62# 10 reps  2 sets ea LE. PT instructed in set up & recommendations for [facility]. Pt verbalized understanding.   PATIENT EDUCATION:  Education details: HEP review, POC, rationale for interventions Person educated: Patient Education method: Explanation, Demonstration, Verbal cues, and Handouts Education comprehension: verbalized understanding, returned demonstration, and verbal cues required  HOME EXERCISE PROGRAM: Access Code: [email or web address] URL: [email or web address] Date:  07/08/2022 Prepared by: Faustino Congress  Exercises - Ankle Alphabet in Elevation  - 2-4 x daily - 7 x weekly - 1 sets - 1 reps - Quad Setting and Stretching  - 2-4 x daily - 7 x weekly - 5-10 sets - 10 reps - prop 5-10 minutes & quad set5 seconds hold - Supine Heel Slide with Strap  - 2-3 x daily - 7 x weekly - 2-3 sets - 10 reps - 5 seconds hold - Supine Straight Leg Raises  - 2-3 x daily - 7 x weekly - 2-3 sets - 10 reps - 5 seconds hold - Hooklying Hamstring Stretch with Strap  - 2-4 x daily - 7 x weekly - 1 sets - 3 reps - 20-30 seconds hold - Supine Quadriceps Stretch with Strap on Table  - 2-4 x daily - 7 x weekly - 1 sets - 3 reps - 20-30 seconds hold - Seated Knee Flexion Extension AROM   - 2-4 x daily - 7 x weekly - 2-3 sets - 10 reps - 5 seconds hold - Seated Hamstring Stretch with Strap  - 2-4 x daily - 7 x weekly - 1 sets - 3 reps - 20-30 seconds hold - Seated Long Arc Quad  - 2 x daily - 7 x weekly - 2-3 sets - 10 reps - 5 seconds hold - squat at sink with chair behind you  - 2 x daily - 7 x weekly - 2-3 sets - 10 reps - 5 seconds hold - Gastroc Stretch on Step  - 2-4 x daily - 7 x weekly - 1 sets - 3 reps - 20-30 seconds hold - Seated Straight Leg Raise  - 4-5 x daily - 7 x weekly - 1 sets - 5 reps - 3 sec hold  ASSESSMENT:  CLINICAL IMPRESSION:  pt appears to understand exercises with stairs better.  His function continues to improve.    He continues to need repetition to understand exercises & activities.    OBJECTIVE IMPAIRMENTS: Abnormal gait, decreased activity tolerance, decreased balance, decreased endurance, decreased knowledge of condition, decreased knowledge of use of DME, decreased mobility, difficulty walking, decreased ROM, decreased strength, increased edema, increased muscle spasms, postural dysfunction, and pain.   ACTIVITY LIMITATIONS: carrying, lifting, bending, sitting, standing, squatting, sleeping, stairs, transfers, and locomotion level  PARTICIPATION LIMITATIONS: meal prep, cleaning, laundry, driving, community activity, and yard work  PERSONAL FACTORS: Fitness and 3+ comorbidities: see PMH are also affecting patient's functional outcome.   REHAB POTENTIAL: Good  CLINICAL DECISION MAKING: Stable/uncomplicated  EVALUATION COMPLEXITY: Low   GOALS: Goals reviewed with patient? Yes  SHORT TERM GOALS: (target date for Short term goals 07/15/2022)   1.  Patient will demonstrate independent use of home exercise program to maintain progress from in clinic treatments.  Goal status: Met 07/01/22  2. Right knee PROM -5* ext to 95* flexion  Goal Status: Partially Met 07/06/22  LONG TERM GOALS: (target dates for all long term goals are 10 weeks  08/26/2022 )   1. Patient will demonstrate/report pain at worst less than or equal to 2/10 to facilitate minimal limitation in daily activity secondary to pain symptoms.  Goal status: New   2. Patient will demonstrate independent use of home exercise program to facilitate ability to maintain/progress functional gains from skilled physical therapy services.  Goal status: New   3. Patient will demonstrate FOTO outcome > or = 55 % to indicate reduced disability due to condition.  Goal status: New  4.  Patient will demonstrate right knee LE MMT 5/5 throughout to faciltiate usual transfers, stairs, squatting at [facility] for daily life.   Goal status: New   5.  Patient right knee AROM 0* ext to 100* flexion. Goal status: New   6.  pt  ambulates >500', negotiates ramps & curbs without device and stairs single rail modified independent.  Goal status: New   7.  pt demonstrates activities to enable to return to yard work as pt goal.  Goal Status: New   PLAN:  PT FREQUENCY: 2-3x/week  PT DURATION: 10 weeks  PLANNED INTERVENTIONS: Therapeutic exercises, Therapeutic activity, Neuromuscular re-education, Balance training, Gait training, Patient/Family education, Self Care, Joint mobilization, Stair training, Vestibular training, DME instructions, Electrical stimulation, Cryotherapy, Moist heat, scar mobilization, Taping, Vasopneumatic device, Ultrasound, Manual therapy, and Re-evaluation  PLAN FOR NEXT SESSION: check on HEP at stairs,   continue therapeutic exercise for strength & balance,  Quad activation, Closed chain functional exercises, vaso to end PRN    Jamey Reas, PT, DPT 07/28/2022, 1:59 PM

## 2022-07-28 NOTE — Patient Instructions (Signed)
stairs  descend - going down One step at time or step-to pattern (bringing both feet to one step)  alternating lead legs. 1 or 2 times  alternating pattern - one foot to each step.  Make sure you keep your weight coming forward especially when stepping right so left is bending.      Ascend -going up Alternating pattern (one foot per step so bringing each foot to next step) make sure you shift your weight over leg that is up prior to straightening knee      Exercises at bottom: Stretch calf muscle. Stand on bottom step with one foot back so balls of toes at edge. Push heel downward with knee straight 30 sec hold 3 reps.  Do on both legs. Stand on floor & place arch of right foot at edge of 2nd step:  shift forward bending right knee 10 sec hold, then shift back straightening right knee & lean trunk forward to stretch hamstring 10 seconds.  Do 5 times on each leg.

## 2022-08-01 ENCOUNTER — Encounter: Payer: Self-pay | Admitting: Physical Therapy

## 2022-08-01 ENCOUNTER — Ambulatory Visit: Payer: PPO | Admitting: Physical Therapy

## 2022-08-01 DIAGNOSIS — R2689 Other abnormalities of gait and mobility: Secondary | ICD-10-CM

## 2022-08-01 DIAGNOSIS — R600 Localized edema: Secondary | ICD-10-CM

## 2022-08-01 DIAGNOSIS — R2681 Unsteadiness on feet: Secondary | ICD-10-CM | POA: Diagnosis not present

## 2022-08-01 DIAGNOSIS — M6281 Muscle weakness (generalized): Secondary | ICD-10-CM | POA: Diagnosis not present

## 2022-08-01 DIAGNOSIS — M25661 Stiffness of right knee, not elsewhere classified: Secondary | ICD-10-CM | POA: Diagnosis not present

## 2022-08-01 DIAGNOSIS — M25561 Pain in right knee: Secondary | ICD-10-CM | POA: Diagnosis not present

## 2022-08-01 NOTE — Therapy (Signed)
OUTPATIENT PHYSICAL THERAPY TREATMENT NOTE   Patient Name: Bryan Hart MRN: [id number] DOB:11/15/1950, 72 y.o., male Today's Date: 08/01/2022    END OF SESSION:   PT End of Session - 08/01/22 1016     Visit Number 15    Number of Visits 25    Date for PT Re-Evaluation 08/26/22    Authorization Type [employer]    Authorization Time Period $25 co-pay    Progress Note Due on Visit 67    PT Start Time 3151    PT Stop Time 1056    PT Time Calculation (min) 42 min    Activity Tolerance Patient tolerated treatment well;No increased pain    Behavior During Therapy [facility] for tasks assessed/performed                   Past Medical History:  Diagnosis Date   Arthritis    HLD (hyperlipidemia)    Hypertension    dr  ed green      stress test   12 yrs ago   PVC's (premature ventricular contractions) 12 years ago   stress test done   Sleep apnea    Stroke [facility])    minor stroke 01/2018   Past Surgical History:  Procedure Laterality Date   ANKLE ARTHROSCOPY  06/27/2008   JOINT REPLACEMENT  06/27/2009   rt shoulder rotator cuff repair   knee surgeries  [id number]   x2   SHOULDER HEMI-ARTHROPLASTY  01/12/2012   Procedure: SHOULDER HEMI-ARTHROPLASTY;  Surgeon: Nita Sells, MD;  Location: [place];  Service: Orthopedics;  Laterality: Right;   SHOULDER SURGERY Right 07/2021   TOTAL KNEE ARTHROPLASTY Right 05/31/2022   Procedure: RIGHT TOTAL KNEE ARTHROPLASTY;  Surgeon: Mcarthur Rossetti, MD;  Location: [place];  Service: Orthopedics;  Laterality: Right;   Patient Active Problem List   Diagnosis Date Noted   Asymptomatic PVCs 07/26/2022   Hyperlipidemia 07/26/2022   Family history of heart disease 07/26/2022   Essential hypertension 07/26/2022   Status post total right knee replacement 05/31/2022   Unilateral primary osteoarthritis, left knee 03/28/2022   Unilateral primary osteoarthritis, right knee 03/28/2022   Primary osteoarthritis of left knee 04/30/2020    Effusion, right knee 03/31/2020   Degenerative arthritis of right knee 03/31/2020   Bilateral primary osteoarthritis of knee 12/27/2018   AV block 07/29/2018   Central sleep apnea 07/29/2018   Cerebrovascular accident ([place]) 07/29/2018   Primary localized osteoarthrosis, shoulder region 01/13/2012     THERAPY DIAG:  Acute pain of right knee  Muscle weakness (generalized)  Stiffness of right knee, not elsewhere classified  Other abnormalities of gait and mobility  Unsteadiness on feet  Localized edema   PCP: Sueanne Margarita, DO  REFERRING PROVIDER: Jean Rosenthal, MD  REFERRING DIAG:  M17.11 (ICD-10-CM) - Unilateral primary osteoarthritis, right knee  Z96.651 (ICD-10-CM) - Status post total right knee replacement    EVAL THERAPY DIAG:  Acute pain of right knee  Muscle weakness (generalized)  Stiffness of right knee, not elsewhere classified  Localized edema  Other abnormalities of gait and mobility  Unsteadiness on feet  Rationale for Evaluation and Treatment: Rehabilitation  ONSET DATE:  05/31/2022 Right TKA  SUBJECTIVE:   SUBJECTIVE STATEMENT: He did his exercises and is improving.    PERTINENT HISTORY: OA, Right shoulder hemiarthroplasty, AV block, CVA 01/2018, HTN  PAIN:  Are you having pain? Yes: NPRS scale: today  1/10 and in last week 0/10 - 3/10 Pain location: right knee anterior and  lateral Pain description: sore  Aggravating factors: twisting / turning Relieving factors: sit down, massage, meds, ice  PRECAUTIONS: None  WEIGHT BEARING RESTRICTIONS: No  FALLS:  Has patient fallen in last 6 months? No    LIVING ENVIRONMENT: Lives with: lives with their spouse Lives in: [place] 2-story half bath downstairs, bedrooms & bathrooms are upstairs. Stairs: Yes: Internal: 13 steps; on right going up and External: 1 steps; none Has following equipment at home: Single point cane, Walker - 2 wheeled, shower chair, and Grab bars  OCCUPATION:  retired   PLOF: Independent, [place] with household mobility without device, and Independent with community mobility without device  PATIENT GOALS:   function without pain, improve walking, yard work  NEXT MD VISIT: 07/13/2022  OBJECTIVE: (all measures taken from initial evaluation unless otherwise dated)  DIAGNOSTIC FINDINGS: 05/31/2022 post-op X-ray Evidence of patient's recent right total knee replacement with prosthetic components intact and normally located. Mild air and fluid within the knee joint. Skin staples over the anterior soft tissues vertically just left of midline.  PATIENT SURVEYS:  EVAL: FOTO intake: 47%   predicted:  55% 07/18/22: FOTO 64  EDEMA:  EVAL: RLE: above knee 49.8cm  around knee 48.9cm below knee  42.8cm LLE:  above knee 41.8cm  around knee 44cm  below knee  35cm  POSTURE: rounded shoulders, forward head, flexed trunk , and weight shift left  PALPATION: Tenderness along incision, patella borders, joint line, distal quads and proximal gastroc.   LOWER EXTREMITY ROM:   ROM Right eval Right 06/22/22 Right 06/29/22 Right 07/01/22 Right 07/06/22 Right 07/11/22 Right 07/18/22 Right 07/28/22 Right   Knee flexion Seated A: 83* P: 86* Supine A: 105 P: 112 Seated P: 115* A: 107* Seated A: 110 P: 118 Supine:  A: 113 Supine A:115*     Knee extension Supine: P: -11* A: quad set -14* Seated A: LAQ -34* A: Seated LAQ  -14 P: Supine heel prop -8 Seated  A: LAQ -12* Standing A: -8* Seated A: -12 P: -10 Seated A: -12  Supine A: -10* Seated LAQ: -7 Seated LAQ -6* Seated LAQ -5* Standing A: -2*   (Blank rows = not tested)  LOWER EXTREMITY MMT:  MMT Right eval Left 07/14/2022 Right 07/14/2022  Knee flexion 3-/5 HH [facility] 24.1#, 23.7# HH Dynameter 23.2#, 21.8# RLE:LLE 94%  Knee extension 3-/5 HH Dynameter 36.5#, 37.6# HH Dynameter 31.8#, 37.9# RLE:LLE  94%   (Blank rows = not tested)  FUNCTIONAL TESTS:  18 inch chair transfer:  requires use of UEs on armrests and RW to stabilize  GAIT: 1:15/2024: pt amb without assistive device >200' safely.   06/14/2022: Distance walked: 100' Assistive device utilized: Environmental consultant - 2 wheeled Level of assistance: SBA Comments: antalgic pattern with decreased RLE stance, right knee flexed in stance, significantly limited in knee flexion for swing - walks with stiff LE,    TODAY'S TREATMENT [facility] Adult PT Treatment: DATE:  08/01/2022: Therapeutic Exercise: Recumbent bike x 8 min level 4; seat 8 Leg press BLEs 125# 15 reps with 5 sec hold ext;  Marching for SLS knee ext stance 100# 10 reps march 3 sets;  RLE only 67# 15 reps.  Gastroc stretch on step heel depression 30 sec hold 2 reps ea LE Function squat 5 reps picking up items from floor with PT cues. Seated LAQ & active flexion with contralateral LE opposing motion 5 sec hold 15 reps.  Standing knee flexion alternating LEs with cues of stance LE knee ext. 10  reps ea LE.   Neuromuscular Re-education:  Tandem stance at sink:  counting out loud to 30 ea LE in front 2 reps with intermittent touch required (stop count when touching). SLS modified with contralateral forefoot in lower cabinet. counting out loud to 30 ea LE in front 2 reps with intermittent touch required (stop count when touching). Slider to 3 cones (ant-lat, lat & post-lat) with stance knee flexion on outward motion & extension inward motion.  He requires UE assist. 5 reps ea LE.    07/28/2022: Therapeutic Exercise: Recumbent bike x 8 min level 4; seat 8 Gastroc stretch on step heel depression 30 sec hold Knee flexion to hamstring stretch at bottom of stairs using right rail right foot on 2nd step up: wt shift forward for flexion stretch and RLE knee ext for hamstring stretch 10 sec hold 10 reps.   Therapeutic Activities: Stairs flight of 11 steps: using left rail ascend similar to his home - descend 1st time step-to pattern pattern with RLE eccentric, 2nd time  alternating with 2 rails. 3rd time alternating lead LEs but continuing step-to with single rail.   PT demo & verbal cues on technique.   Ascend with left rail reciprocal pattern with cues on wt shift & knee motion. 3 flights.  PT updated written exercises for stairs & above stretches as HEP.  See pt instructions.  Pt appears to understand better & verbalizes understanding.  He needs directions repeated multiple times for comprehension.    Neuromuscular Re-education:  Tandem stance on foam beam:  30sec ea LE in front 2 reps with intermittent touch required. Slider to 3 cones (ant-lat, lat & post-lat) with stance knee flexion on outward motion & extension inward motion.  He requires UE assist. 5 reps ea LE.      07/25/2022: Recumbent bike x 8 min level 4; seat 8 Gastroc stretch on step heel depression 30 sec hold Knee flexion to hamstring stretch at bottom of stairs using right rail right foot on 2nd step up: wt shift forward for flexion stretch and RLE knee ext for hamstring stretch 10 sec hold 10 reps.  Knee extension on BATCA BLEs 20# 15 reps;  BLE concentric & single leg isometric / eccentric 10# 10 reps 1 set ea LE.  Single leg only 5# 10 reps ea LE. Knee flexion on BATCA BLEs 35# 15 reps;  Single leg only 15# 10 reps 2 sets ea LE. PT instructed in set up & recommendations for [employer]. Pt verbalized understanding.   Therapeutic Activities: Stairs flight of 11 steps: using right rail ascend similar to his home - descend 1st time step-to pattern pattern with RLE eccentric, 2nd & 3rd time alternating pattern with noted issues flexing LLE.  4th time step-to pattern alternating lead LE.  Ascend 4 times alternating pattern.  PT demo & verbal cues on technique.  HEP Stairs flight of steps: using right rail ascend similar to his home -  descend  step-to pattern (bringing both feet to one step)  alternating lead legs. 1 or 2 times  alternating pattern - one foot to each step.  Make sure you keep your  weight coming forward especially when stepping right so left is bending.     Ascend 4 times alternating pattern.  PT demo & verbal cues on technique.    PATIENT EDUCATION:  Education details: HEP review, POC, rationale for interventions Person educated: Patient Education method: Explanation, Demonstration, Verbal cues, and Handouts Education comprehension: verbalized understanding, returned demonstration, and verbal  cues required  HOME EXERCISE PROGRAM: Access Code: [email or web address] URL: [email or web address] Date: 07/08/2022 Prepared by: Faustino Congress  Exercises - Ankle Alphabet in Elevation  - 2-4 x daily - 7 x weekly - 1 sets - 1 reps - Quad Setting and Stretching  - 2-4 x daily - 7 x weekly - 5-10 sets - 10 reps - prop 5-10 minutes & quad set5 seconds hold - Supine Heel Slide with Strap  - 2-3 x daily - 7 x weekly - 2-3 sets - 10 reps - 5 seconds hold - Supine Straight Leg Raises  - 2-3 x daily - 7 x weekly - 2-3 sets - 10 reps - 5 seconds hold - Hooklying Hamstring Stretch with Strap  - 2-4 x daily - 7 x weekly - 1 sets - 3 reps - 20-30 seconds hold - Supine Quadriceps Stretch with Strap on Table  - 2-4 x daily - 7 x weekly - 1 sets - 3 reps - 20-30 seconds hold - Seated Knee Flexion Extension AROM   - 2-4 x daily - 7 x weekly - 2-3 sets - 10 reps - 5 seconds hold - Seated Hamstring Stretch with Strap  - 2-4 x daily - 7 x weekly - 1 sets - 3 reps - 20-30 seconds hold - Seated Long Arc Quad  - 2 x daily - 7 x weekly - 2-3 sets - 10 reps - 5 seconds hold - squat at sink with chair behind you  - 2 x daily - 7 x weekly - 2-3 sets - 10 reps - 5 seconds hold - Gastroc Stretch on Step  - 2-4 x daily - 7 x weekly - 1 sets - 3 reps - 20-30 seconds hold - Seated Straight Leg Raise  - 4-5 x daily - 7 x weekly - 1 sets - 5 reps - 3 sec hold  ASSESSMENT: CLINICAL IMPRESSION:  pt is slowly improving knee ext active range & function with direction of PT.  He still requires repetition  to learn new tasks.    OBJECTIVE IMPAIRMENTS: Abnormal gait, decreased activity tolerance, decreased balance, decreased endurance, decreased knowledge of condition, decreased knowledge of use of DME, decreased mobility, difficulty walking, decreased ROM, decreased strength, increased edema, increased muscle spasms, postural dysfunction, and pain.   ACTIVITY LIMITATIONS: carrying, lifting, bending, sitting, standing, squatting, sleeping, stairs, transfers, and locomotion level  PARTICIPATION LIMITATIONS: meal prep, cleaning, laundry, driving, community activity, and yard work  PERSONAL FACTORS: Fitness and 3+ comorbidities: see PMH are also affecting patient's functional outcome.   REHAB POTENTIAL: Good  CLINICAL DECISION MAKING: Stable/uncomplicated  EVALUATION COMPLEXITY: Low   GOALS: Goals reviewed with patient? Yes  SHORT TERM GOALS: (target date for Short term goals 07/15/2022)   1.  Patient will demonstrate independent use of home exercise program to maintain progress from in clinic treatments.  Goal status: Met 07/01/22  2. Right knee PROM -5* ext to 95* flexion  Goal Status: Partially Met 07/06/22  LONG TERM GOALS: (target dates for all long term goals are 10 weeks  08/26/2022 )   1. Patient will demonstrate/report pain at worst less than or equal to 2/10 to facilitate minimal limitation in daily activity secondary to pain symptoms.  Goal status: New   2. Patient will demonstrate independent use of home exercise program to facilitate ability to maintain/progress functional gains from skilled physical therapy services.  Goal status: New   3. Patient will demonstrate FOTO outcome > or = 55 % to indicate reduced disability due  to condition.  Goal status: New   4.  Patient will demonstrate right knee LE MMT 5/5 throughout to faciltiate usual transfers, stairs, squatting at [facility] for daily life.   Goal status: New   5.  Patient right knee AROM 0* ext to 100* flexion. Goal  status: New   6.  pt ambulates >500', negotiates ramps & curbs without device and stairs single rail modified independent.  Goal status: New   7.  pt demonstrates activities to enable to return to yard work as pt goal.  Goal Status: New   PLAN:  PT FREQUENCY: 2-3x/week  PT DURATION: 10 weeks  PLANNED INTERVENTIONS: Therapeutic exercises, Therapeutic activity, Neuromuscular re-education, Balance training, Gait training, Patient/Family education, Self Care, Joint mobilization, Stair training, Vestibular training, DME instructions, Electrical stimulation, Cryotherapy, Moist heat, scar mobilization, Taping, Vasopneumatic device, Ultrasound, Manual therapy, and Re-evaluation  PLAN FOR NEXT SESSION:  continue therapeutic exercise for strength & balance,  Quad activation, Closed chain functional exercises, vaso to end PRN    Jamey Reas, PT, DPT 08/01/2022, 10:58 AM

## 2022-08-04 ENCOUNTER — Ambulatory Visit: Payer: PPO | Admitting: Physical Therapy

## 2022-08-04 ENCOUNTER — Encounter: Payer: Self-pay | Admitting: Physical Therapy

## 2022-08-04 DIAGNOSIS — M6281 Muscle weakness (generalized): Secondary | ICD-10-CM | POA: Diagnosis not present

## 2022-08-04 DIAGNOSIS — M25661 Stiffness of right knee, not elsewhere classified: Secondary | ICD-10-CM | POA: Diagnosis not present

## 2022-08-04 DIAGNOSIS — R2689 Other abnormalities of gait and mobility: Secondary | ICD-10-CM

## 2022-08-04 DIAGNOSIS — R600 Localized edema: Secondary | ICD-10-CM | POA: Diagnosis not present

## 2022-08-04 DIAGNOSIS — R2681 Unsteadiness on feet: Secondary | ICD-10-CM | POA: Diagnosis not present

## 2022-08-04 DIAGNOSIS — M25561 Pain in right knee: Secondary | ICD-10-CM | POA: Diagnosis not present

## 2022-08-04 NOTE — Therapy (Signed)
OUTPATIENT PHYSICAL THERAPY TREATMENT NOTE   Patient Name: Bryan Hart MRN: [id number] DOB:[birth year], 72 y.o., male Today's Date: 08/04/2022    END OF SESSION:   PT End of Session - 08/04/22 1018     Visit Number 16    Number of Visits 25    Date for PT Re-Evaluation 08/26/22    Authorization Type [employer]    Authorization Time Period $25 co-pay    Progress Note Due on Visit 72    PT Start Time 1015    PT Stop Time 1100    PT Time Calculation (min) 45 min    Activity Tolerance Patient tolerated treatment well;No increased pain    Behavior During Therapy [facility] for tasks assessed/performed                    Past Medical History:  Diagnosis Date   Arthritis    HLD (hyperlipidemia)    Hypertension    dr  ed green      stress test   12 yrs ago   PVC's (premature ventricular contractions) 12 years ago   stress test done   Sleep apnea    Stroke [facility])    minor stroke 01/2018   Past Surgical History:  Procedure Laterality Date   ANKLE ARTHROSCOPY  06/27/2008   JOINT REPLACEMENT  06/27/2009   rt shoulder rotator cuff repair   knee surgeries  [id number]   x2   SHOULDER HEMI-ARTHROPLASTY  01/12/2012   Procedure: SHOULDER HEMI-ARTHROPLASTY;  Surgeon: Nita Sells, MD;  Location: [place];  Service: Orthopedics;  Laterality: Right;   SHOULDER SURGERY Right 07/2021   TOTAL KNEE ARTHROPLASTY Right 05/31/2022   Procedure: RIGHT TOTAL KNEE ARTHROPLASTY;  Surgeon: Mcarthur Rossetti, MD;  Location: [place];  Service: Orthopedics;  Laterality: Right;   Patient Active Problem List   Diagnosis Date Noted   Asymptomatic PVCs 07/26/2022   Hyperlipidemia 07/26/2022   Family history of heart disease 07/26/2022   Essential hypertension 07/26/2022   Status post total right knee replacement 05/31/2022   Unilateral primary osteoarthritis, left knee 03/28/2022   Unilateral primary osteoarthritis, right knee 03/28/2022   Primary osteoarthritis of left knee 04/30/2020    Effusion, right knee 03/31/2020   Degenerative arthritis of right knee 03/31/2020   Bilateral primary osteoarthritis of knee 12/27/2018   AV block 07/29/2018   Central sleep apnea 07/29/2018   Cerebrovascular accident ([place]) 07/29/2018   Primary localized osteoarthrosis, shoulder region 01/13/2012     THERAPY DIAG:  Acute pain of right knee  Muscle weakness (generalized)  Stiffness of right knee, not elsewhere classified  Other abnormalities of gait and mobility  Unsteadiness on feet  Localized edema   PCP: Sueanne Margarita, DO  REFERRING PROVIDER: Jean Rosenthal, MD  REFERRING DIAG:  M17.11 (ICD-10-CM) - Unilateral primary osteoarthritis, right knee  Z96.651 (ICD-10-CM) - Status post total right knee replacement    EVAL THERAPY DIAG:  Acute pain of right knee  Muscle weakness (generalized)  Stiffness of right knee, not elsewhere classified  Localized edema  Other abnormalities of gait and mobility  Unsteadiness on feet  Rationale for Evaluation and Treatment: Rehabilitation  ONSET DATE:  05/31/2022 Right TKA  SUBJECTIVE:   SUBJECTIVE STATEMENT: He has been doing chores like vacuuming without issues.  He is feeling like left knee is beginning to hurt in back & is limiting more than right knee now.      PERTINENT HISTORY: OA, Right shoulder hemiarthroplasty, AV block, CVA 01/2018, HTN  PAIN:  Are you having pain? Yes: NPRS scale: today right knee 0/10 & in last week 0/10 - 3/10  and left knee today 4/10 & in last week ranging 1/10 - 5/10 Pain location: right knee anterior and lateral Pain description: sore  Aggravating factors: twisting / turning Relieving factors: sit down, massage, meds, ice  PRECAUTIONS: None  WEIGHT BEARING RESTRICTIONS: No  FALLS:  Has patient fallen in last 6 months? No    LIVING ENVIRONMENT: Lives with: lives with their spouse Lives in: [place] 2-story half bath downstairs, bedrooms & bathrooms are upstairs. Stairs:  Yes: Internal: 13 steps; on right going up and External: 1 steps; none Has following equipment at home: Single point cane, Walker - 2 wheeled, shower chair, and Grab bars  OCCUPATION: retired   PLOF: Independent, [place] with household mobility without device, and Independent with community mobility without device  PATIENT GOALS:   function without pain, improve walking, yard work  NEXT MD VISIT: 07/13/2022  OBJECTIVE: (all measures taken from initial evaluation unless otherwise dated)  DIAGNOSTIC FINDINGS: 05/31/2022 post-op X-ray Evidence of patient's recent right total knee replacement with prosthetic components intact and normally located. Mild air and fluid within the knee joint. Skin staples over the anterior soft tissues vertically just left of midline.  PATIENT SURVEYS:  EVAL: FOTO intake: 47%   predicted:  55% 07/18/22: FOTO 64  EDEMA:  EVAL: RLE: above knee 49.8cm  around knee 48.9cm below knee  42.8cm LLE:  above knee 41.8cm  around knee 44cm  below knee  35cm  POSTURE: rounded shoulders, forward head, flexed trunk , and weight shift left  PALPATION: Tenderness along incision, patella borders, joint line, distal quads and proximal gastroc.   LOWER EXTREMITY ROM:   ROM Right eval Right 06/22/22 Right 06/29/22 Right 07/01/22 Right 07/06/22 Right 07/11/22 Right 07/18/22 Right 07/28/22 Right 08/02/22  Knee flexion Seated A: 83* P: 86* Supine A: 105 P: 112 Seated P: 115* A: 107* Seated A: 110 P: 118 Supine:  A: 113 Supine A:115*     Knee extension Supine: P: -11* A: quad set -14* Seated A: LAQ -34* A: Seated LAQ  -14 P: Supine heel prop -8 Seated  A: LAQ -12* Standing A: -8* Seated A: -12 P: -10 Seated A: -12  Supine A: -10* Seated LAQ: -7 Seated LAQ -6* Seated LAQ -5* Standing A: -2*   (Blank rows = not tested)  LOWER EXTREMITY MMT:  MMT Right eval Left 07/14/2022 Right 07/14/2022  Knee flexion 3-/5 HH Dynameter 24.1#, 23.7# HH  Dynameter 23.2#, 21.8# RLE:LLE 94%  Knee extension 3-/5 HH Dynameter 36.5#, 37.6# HH Dynameter 31.8#, 37.9# RLE:LLE  94%   (Blank rows = not tested)  FUNCTIONAL TESTS:  18 inch chair transfer: requires use of UEs on armrests and RW to stabilize  GAIT: 1:15/2024: pt amb without assistive device >200' safely.   06/14/2022: Distance walked: 100' Assistive device utilized: Environmental consultant - 2 wheeled Level of assistance: SBA Comments: antalgic pattern with decreased RLE stance, right knee flexed in stance, significantly limited in knee flexion for swing - walks with stiff LE,    TODAY'S TREATMENT [facility] Adult PT Treatment: DATE:  08/04/2022: Therapeutic Exercise: PT educated pt on need for ongoing fitness to include components of flexibility, strength, endurance & balance. PT discussed older adult classes at [facility] that he might benefit from.  Pt verbalized understanding.  Recumbent bike x 8 min level 4; seat 8 Leg press BLEs 125# 15 reps with 5 sec hold ext;  Marching for SLS knee ext stance 100# 10 reps march 3 sets;  RLE only 62# 15 reps. LLE only 50# 15 reps;  Gastroc stretch on step heel depression 30 sec hold 2 reps ea LE Function squat 5 reps picking up items from floor with PT cues. Squat over chair with BUE on counter 5 reps.   Neuromuscular Re-education:  Tandem stance at sink:  counting out loud to 30 ea LE in front 1 reps without touch required (stop count when touching). SLS modified with contralateral forefoot in lower cabinet. counting out loud to 30 ea LE in front 2 reps with intermittent touch required (stop count when touching). Slider to 3 cones (ant-lat, lat & post-lat) with stance knee flexion on outward motion & extension inward motion.  He requires UE assist. 5 reps ea LE.    08/01/2022: Therapeutic Exercise: Recumbent bike x 8 min level 4; seat 8 Leg press BLEs 125# 15 reps with 5 sec hold ext;  Marching for SLS knee ext stance 100# 10 reps march 3 sets;  RLE only 67# 15  reps.  Gastroc stretch on step heel depression 30 sec hold 2 reps ea LE Function squat 5 reps picking up items from floor with PT cues. Seated LAQ & active flexion with contralateral LE opposing motion 5 sec hold 15 reps.  Standing knee flexion alternating LEs with cues of stance LE knee ext. 10 reps ea LE.   Neuromuscular Re-education:  Tandem stance at sink:  counting out loud to 30 ea LE in front 2 reps with intermittent touch required (stop count when touching). SLS modified with contralateral forefoot in lower cabinet. counting out loud to 30 ea LE in front 2 reps with intermittent touch required (stop count when touching). Slider to 3 cones (ant-lat, lat & post-lat) with stance knee flexion on outward motion & extension inward motion.  He requires UE assist. 5 reps ea LE.    07/28/2022: Therapeutic Exercise: Recumbent bike x 8 min level 4; seat 8 Gastroc stretch on step heel depression 30 sec hold Knee flexion to hamstring stretch at bottom of stairs using right rail right foot on 2nd step up: wt shift forward for flexion stretch and RLE knee ext for hamstring stretch 10 sec hold 10 reps.   Therapeutic Activities: Stairs flight of 11 steps: using left rail ascend similar to his home - descend 1st time step-to pattern pattern with RLE eccentric, 2nd time alternating with 2 rails. 3rd time alternating lead LEs but continuing step-to with single rail.   PT demo & verbal cues on technique.   Ascend with left rail reciprocal pattern with cues on wt shift & knee motion. 3 flights.  PT updated written exercises for stairs & above stretches as HEP.  See pt instructions.  Pt appears to understand better & verbalizes understanding.  He needs directions repeated multiple times for comprehension.    Neuromuscular Re-education:  Tandem stance on foam beam:  30sec ea LE in front 2 reps with intermittent touch required. Slider to 3 cones (ant-lat, lat & post-lat) with stance knee flexion on outward  motion & extension inward motion.  He requires UE assist. 5 reps ea LE.     PATIENT EDUCATION:  Education details: HEP review, POC, rationale for interventions Person educated: Patient Education method: Explanation, Demonstration, Verbal cues, and Handouts Education comprehension: verbalized understanding, returned demonstration, and verbal cues required  HOME EXERCISE PROGRAM: Access Code: [email or web address] URL: [email or web address] Date: 07/08/2022 Prepared by: Faustino Congress  Exercises -  Ankle Alphabet in Elevation  - 2-4 x daily - 7 x weekly - 1 sets - 1 reps - Quad Setting and Stretching  - 2-4 x daily - 7 x weekly - 5-10 sets - 10 reps - prop 5-10 minutes & quad set5 seconds hold - Supine Heel Slide with Strap  - 2-3 x daily - 7 x weekly - 2-3 sets - 10 reps - 5 seconds hold - Supine Straight Leg Raises  - 2-3 x daily - 7 x weekly - 2-3 sets - 10 reps - 5 seconds hold - Hooklying Hamstring Stretch with Strap  - 2-4 x daily - 7 x weekly - 1 sets - 3 reps - 20-30 seconds hold - Supine Quadriceps Stretch with Strap on Table  - 2-4 x daily - 7 x weekly - 1 sets - 3 reps - 20-30 seconds hold - Seated Knee Flexion Extension AROM   - 2-4 x daily - 7 x weekly - 2-3 sets - 10 reps - 5 seconds hold - Seated Hamstring Stretch with Strap  - 2-4 x daily - 7 x weekly - 1 sets - 3 reps - 20-30 seconds hold - Seated Long Arc Quad  - 2 x daily - 7 x weekly - 2-3 sets - 10 reps - 5 seconds hold - squat at sink with chair behind you  - 2 x daily - 7 x weekly - 2-3 sets - 10 reps - 5 seconds hold - Gastroc Stretch on Step  - 2-4 x daily - 7 x weekly - 1 sets - 3 reps - 20-30 seconds hold - Seated Straight Leg Raise  - 4-5 x daily - 7 x weekly - 1 sets - 5 reps - 3 sec hold  ASSESSMENT: CLINICAL IMPRESSION:  patient appears to understand basics of ongoing exercises in preparation for discharge.  Pt continues to improve knee function & balance with PT.    OBJECTIVE IMPAIRMENTS: Abnormal  gait, decreased activity tolerance, decreased balance, decreased endurance, decreased knowledge of condition, decreased knowledge of use of DME, decreased mobility, difficulty walking, decreased ROM, decreased strength, increased edema, increased muscle spasms, postural dysfunction, and pain.   ACTIVITY LIMITATIONS: carrying, lifting, bending, sitting, standing, squatting, sleeping, stairs, transfers, and locomotion level  PARTICIPATION LIMITATIONS: meal prep, cleaning, laundry, driving, community activity, and yard work  PERSONAL FACTORS: Fitness and 3+ comorbidities: see PMH are also affecting patient's functional outcome.   REHAB POTENTIAL: Good  CLINICAL DECISION MAKING: Stable/uncomplicated  EVALUATION COMPLEXITY: Low   GOALS: Goals reviewed with patient? Yes  SHORT TERM GOALS: (target date for Short term goals 07/15/2022)   1.  Patient will demonstrate independent use of home exercise program to maintain progress from in clinic treatments.  Goal status: Met 07/01/22  2. Right knee PROM -5* ext to 95* flexion  Goal Status: Partially Met 07/06/22  LONG TERM GOALS: (target dates for all long term goals are 10 weeks  08/26/2022 )   1. Patient will demonstrate/report pain at worst less than or equal to 2/10 to facilitate minimal limitation in daily activity secondary to pain symptoms.  Goal status: New   2. Patient will demonstrate independent use of home exercise program to facilitate ability to maintain/progress functional gains from skilled physical therapy services.  Goal status: New   3. Patient will demonstrate FOTO outcome > or = 55 % to indicate reduced disability due to condition.  Goal status: New   4.  Patient will demonstrate right knee LE MMT 5/5 throughout to  faciltiate usual transfers, stairs, squatting at PLOF for daily life.   Goal status: New   5.  Patient right knee AROM 0* ext to 100* flexion. Goal status: New   6.  pt ambulates >500', negotiates ramps &  curbs without device and stairs single rail modified independent.  Goal status: New   7.  pt demonstrates activities to enable to return to yard work as pt goal.  Goal Status: New   PLAN:  PT FREQUENCY: 2-3x/week  PT DURATION: 10 weeks  PLANNED INTERVENTIONS: Therapeutic exercises, Therapeutic activity, Neuromuscular re-education, Balance training, Gait training, Patient/Family education, Self Care, Joint mobilization, Stair training, Vestibular training, DME instructions, Electrical stimulation, Cryotherapy, Moist heat, scar mobilization, Taping, Vasopneumatic device, Ultrasound, Manual therapy, and Re-evaluation  PLAN FOR NEXT SESSION:  work towards [place],  therapeutic exercise for strength & balance,  Quad activation, Closed chain functional exercises, vaso to end PRN    Jamey Reas, PT, DPT 08/04/2022, 12:56 PM

## 2022-08-09 ENCOUNTER — Encounter: Payer: Self-pay | Admitting: Physical Therapy

## 2022-08-09 ENCOUNTER — Ambulatory Visit: Payer: PPO | Admitting: Physical Therapy

## 2022-08-09 DIAGNOSIS — R2681 Unsteadiness on feet: Secondary | ICD-10-CM

## 2022-08-09 DIAGNOSIS — M25661 Stiffness of right knee, not elsewhere classified: Secondary | ICD-10-CM

## 2022-08-09 DIAGNOSIS — M6281 Muscle weakness (generalized): Secondary | ICD-10-CM | POA: Diagnosis not present

## 2022-08-09 DIAGNOSIS — R2689 Other abnormalities of gait and mobility: Secondary | ICD-10-CM

## 2022-08-09 DIAGNOSIS — M25561 Pain in right knee: Secondary | ICD-10-CM

## 2022-08-09 DIAGNOSIS — R600 Localized edema: Secondary | ICD-10-CM | POA: Diagnosis not present

## 2022-08-09 NOTE — Therapy (Addendum)
OUTPATIENT PHYSICAL THERAPY TREATMENT NOTE   Patient Name: SOCORRO GARBERS MRN: [id number] DOB:March 04, 1951, 72 y.o., male Today's Date: 08/09/2022    END OF SESSION:   PT End of Session - 08/09/22 1018     Visit Number 17    Number of Visits 25    Date for PT Re-Evaluation 08/26/22    Authorization Type [employer]    Authorization Time Period $25 co-pay    Progress Note Due on Visit 24    PT Start Time [id number]    PT Stop Time 1058    PT Time Calculation (min) 44 min    Activity Tolerance Patient tolerated treatment well;No increased pain    Behavior During Therapy [facility] for tasks assessed/performed                     Past Medical History:  Diagnosis Date   Arthritis    HLD (hyperlipidemia)    Hypertension    dr  ed green      stress test   12 yrs ago   PVC's (premature ventricular contractions) 12 years ago   stress test done   Sleep apnea    Stroke [facility])    minor stroke 01/2018   Past Surgical History:  Procedure Laterality Date   ANKLE ARTHROSCOPY  06/27/2008   JOINT REPLACEMENT  06/27/2009   rt shoulder rotator cuff repair   knee surgeries  [id number]   x2   SHOULDER HEMI-ARTHROPLASTY  01/12/2012   Procedure: SHOULDER HEMI-ARTHROPLASTY;  Surgeon: Nita Sells, MD;  Location: [place];  Service: Orthopedics;  Laterality: Right;   SHOULDER SURGERY Right 07/2021   TOTAL KNEE ARTHROPLASTY Right 05/31/2022   Procedure: RIGHT TOTAL KNEE ARTHROPLASTY;  Surgeon: Mcarthur Rossetti, MD;  Location: [place];  Service: Orthopedics;  Laterality: Right;   Patient Active Problem List   Diagnosis Date Noted   Asymptomatic PVCs 07/26/2022   Hyperlipidemia 07/26/2022   Family history of heart disease 07/26/2022   Essential hypertension 07/26/2022   Status post total right knee replacement 05/31/2022   Unilateral primary osteoarthritis, left knee 03/28/2022   Unilateral primary osteoarthritis, right knee 03/28/2022   Primary osteoarthritis of left knee  04/30/2020   Effusion, right knee 03/31/2020   Degenerative arthritis of right knee 03/31/2020   Bilateral primary osteoarthritis of knee 12/27/2018   AV block 07/29/2018   Central sleep apnea 07/29/2018   Cerebrovascular accident ([place]) 07/29/2018   Primary localized osteoarthrosis, shoulder region 01/13/2012     THERAPY DIAG:  Acute pain of right knee  Stiffness of right knee, not elsewhere classified  Muscle weakness (generalized)  Other abnormalities of gait and mobility  Unsteadiness on feet  Localized edema   PCP: Sueanne Margarita, DO  REFERRING PROVIDER: Jean Rosenthal, MD  REFERRING DIAG:  M17.11 (ICD-10-CM) - Unilateral primary osteoarthritis, right knee  Z96.651 (ICD-10-CM) - Status post total right knee replacement    EVAL THERAPY DIAG:  Acute pain of right knee  Muscle weakness (generalized)  Stiffness of right knee, not elsewhere classified  Localized edema  Other abnormalities of gait and mobility  Unsteadiness on feet  Rationale for Evaluation and Treatment: Rehabilitation  ONSET DATE:  05/31/2022 Right TKA  SUBJECTIVE:   SUBJECTIVE STATEMENT: He has been doing standing exercises near sink.  He feels that his knee is stronger.  He is able to do more things with his knee that he used to avoid using the knee to do.     PERTINENT HISTORY: OA, Right  shoulder hemiarthroplasty, AV block, CVA 01/2018, HTN  PAIN:  Are you having pain? Yes: NPRS scale: today right knee 0/10 & in last week 0/10 - 2/10  and left knee today 3/10 & in last week ranging 0/10 - 5/10 Pain location: right knee anterior and lateral Pain description: sore  Aggravating factors: twisting / turning Relieving factors: sit down, massage, meds, ice  PRECAUTIONS: None  WEIGHT BEARING RESTRICTIONS: No  FALLS:  Has patient fallen in last 6 months? No    LIVING ENVIRONMENT: Lives with: lives with their spouse Lives in: [place] 2-story half bath downstairs, bedrooms &  bathrooms are upstairs. Stairs: Yes: Internal: 13 steps; on right going up and External: 1 steps; none Has following equipment at home: Single point cane, Walker - 2 wheeled, shower chair, and Grab bars  OCCUPATION: retired   PLOF: Independent, [place] with household mobility without device, and Independent with community mobility without device  PATIENT GOALS:   function without pain, improve walking, yard work  NEXT MD VISIT: 07/13/2022  OBJECTIVE: (all measures taken from initial evaluation unless otherwise dated)  DIAGNOSTIC FINDINGS: 05/31/2022 post-op X-ray Evidence of patient's recent right total knee replacement with prosthetic components intact and normally located. Mild air and fluid within the knee joint. Skin staples over the anterior soft tissues vertically just left of midline.  PATIENT SURVEYS:  EVAL: FOTO intake: 47%   predicted:  55% 07/18/22: FOTO 64  EDEMA:  EVAL: RLE: above knee 49.8cm  around knee 48.9cm below knee  42.8cm LLE:  above knee 41.8cm  around knee 44cm  below knee  35cm  POSTURE: rounded shoulders, forward head, flexed trunk , and weight shift left  PALPATION: Tenderness along incision, patella borders, joint line, distal quads and proximal gastroc.   LOWER EXTREMITY ROM:   ROM Right eval Right 06/22/22 Right 06/29/22 Right 07/01/22 Right 07/06/22 Right 07/11/22 Right 07/18/22 Right 07/28/22 Right 08/02/22 Right 08/09/22  Knee flexion Seated A: 83* P: 86* Supine A: 105 P: 112 Seated P: 115* A: 107* Seated A: 110 P: 118 Supine:  A: 113 Supine A:115*      Knee extension Supine: P: -11* A: quad set -14* Seated A: LAQ -34* A: Seated LAQ  -14 P: Supine heel prop -8 Seated  A: LAQ -12* Standing A: -8* Seated A: -12 P: -10 Seated A: -12  Supine A: -10* Seated LAQ: -7 Seated LAQ -6* Seated LAQ -5* Standing A: -2* Standing A: -1*   (Blank rows = not tested)  LOWER EXTREMITY MMT:  MMT Right eval Left 07/14/2022  Right 07/14/2022  Knee flexion 3-/5 HH Dynameter 24.1#, 23.7# HH Dynameter 23.2#, 21.8# RLE:LLE 94%  Knee extension 3-/5 HH Dynameter 36.5#, 37.6# HH Dynameter 31.8#, 37.9# RLE:LLE  94%   (Blank rows = not tested)  FUNCTIONAL TESTS:  18 inch chair transfer: requires use of UEs on armrests and RW to stabilize  GAIT: 1:15/2024: pt amb without assistive device >200' safely.   06/14/2022: Distance walked: 100' Assistive device utilized: Environmental consultant - 2 wheeled Level of assistance: SBA Comments: antalgic pattern with decreased RLE stance, right knee flexed in stance, significantly limited in knee flexion for swing - walks with stiff LE,    TODAY'S TREATMENT [facility] Adult PT Treatment: DATE:  08/09/2022: Therapeutic Exercise: Recumbent bike x 8 min level 4; seat 8 Leg press BLEs 125# 15 reps with 5 sec hold ext;  Marching for SLS knee ext stance 100# 10 reps march 2 sets;  RLE only 62# 15 reps. LLE  only 50# 15 reps;  Gastroc stretch on incline board straddle step 30 sec hold 2 reps ea LE Heel raises on incline board for greater range 20 reps light BUE support.  Hamstring stretch SLR strap 30 sec 2 reps ea LE.  Squat over chair with BUE on counter 10 reps.   Neuromuscular Re-education:  Standing on foam with ipsilateral UE support SLS reaching contralateral LE 5 directions: across anteriorly, anteriorly, laterally, posteriorly & across posteriorly 3 reps ea LE.     08/04/2022: Therapeutic Exercise: PT educated pt on need for ongoing fitness to include components of flexibility, strength, endurance & balance. PT discussed older adult classes [facility] that he might benefit from.  Pt verbalized understanding.  Recumbent bike x 8 min level 4; seat 8 Leg press BLEs 125# 15 reps with 5 sec hold ext;  Marching for SLS knee ext stance 100# 10 reps march 3 sets;  RLE only 62# 15 reps. LLE only 50# 15 reps;  Gastroc stretch on step heel depression 30 sec hold 2 reps ea LE Function squat 5 reps picking  up items from floor with PT cues. Squat over chair with BUE on counter 5 reps.   Neuromuscular Re-education:  Tandem stance at sink:  counting out loud to 30 ea LE in front 1 reps without touch required (stop count when touching). SLS modified with contralateral forefoot in lower cabinet. counting out loud to 30 ea LE in front 2 reps with intermittent touch required (stop count when touching). Slider to 3 cones (ant-lat, lat & post-lat) with stance knee flexion on outward motion & extension inward motion.  He requires UE assist. 5 reps ea LE.    08/01/2022: Therapeutic Exercise: Recumbent bike x 8 min level 4; seat 8 Leg press BLEs 125# 15 reps with 5 sec hold ext;  Marching for SLS knee ext stance 100# 10 reps march 3 sets;  RLE only 67# 15 reps.  Gastroc stretch on step heel depression 30 sec hold 2 reps ea LE Function squat 5 reps picking up items from floor with PT cues. Seated LAQ & active flexion with contralateral LE opposing motion 5 sec hold 15 reps.  Standing knee flexion alternating LEs with cues of stance LE knee ext. 10 reps ea LE.   Neuromuscular Re-education:  Tandem stance at sink:  counting out loud to 30 ea LE in front 2 reps with intermittent touch required (stop count when touching). SLS modified with contralateral forefoot in lower cabinet. counting out loud to 30 ea LE in front 2 reps with intermittent touch required (stop count when touching). Slider to 3 cones (ant-lat, lat & post-lat) with stance knee flexion on outward motion & extension inward motion.  He requires UE assist. 5 reps ea LE.     PATIENT EDUCATION:  Education details: HEP review, POC, rationale for interventions Person educated: Patient Education method: Explanation, Demonstration, Verbal cues, and Handouts Education comprehension: verbalized understanding, returned demonstration, and verbal cues required  HOME EXERCISE PROGRAM: Access Code: [email or web address] URL: [email or web address] Date:  07/08/2022 Prepared by: Faustino Congress  Exercises - Ankle Alphabet in Elevation  - 2-4 x daily - 7 x weekly - 1 sets - 1 reps - Quad Setting and Stretching  - 2-4 x daily - 7 x weekly - 5-10 sets - 10 reps - prop 5-10 minutes & quad set5 seconds hold - Supine Heel Slide with Strap  - 2-3 x daily - 7 x weekly - 2-3 sets - 10 reps - 5  seconds hold - Supine Straight Leg Raises  - 2-3 x daily - 7 x weekly - 2-3 sets - 10 reps - 5 seconds hold - [place] Hamstring Stretch with Strap  - 2-4 x daily - 7 x weekly - 1 sets - 3 reps - 20-30 seconds hold - Supine Quadriceps Stretch with Strap on Table  - 2-4 x daily - 7 x weekly - 1 sets - 3 reps - 20-30 seconds hold - Seated Knee Flexion Extension AROM   - 2-4 x daily - 7 x weekly - 2-3 sets - 10 reps - 5 seconds hold - Seated Hamstring Stretch with Strap  - 2-4 x daily - 7 x weekly - 1 sets - 3 reps - 20-30 seconds hold - Seated Long Arc Quad  - 2 x daily - 7 x weekly - 2-3 sets - 10 reps - 5 seconds hold - squat at sink with chair behind you  - 2 x daily - 7 x weekly - 2-3 sets - 10 reps - 5 seconds hold - Gastroc Stretch on Step  - 2-4 x daily - 7 x weekly - 1 sets - 3 reps - 20-30 seconds hold - Seated Straight Leg Raise  - 4-5 x daily - 7 x weekly - 1 sets - 5 reps - 3 sec hold  ASSESSMENT: CLINICAL IMPRESSION:   Patient is making excellent progress with range & strength. However he seems confused still with ongoing exercises including [employer].  He would benefit from further PT to improve his understanding of ongoing exercises to minimize risk of losing gains with his function.     OBJECTIVE IMPAIRMENTS: Abnormal gait, decreased activity tolerance, decreased balance, decreased endurance, decreased knowledge of condition, decreased knowledge of use of DME, decreased mobility, difficulty walking, decreased ROM, decreased strength, increased edema, increased muscle spasms, postural dysfunction, and pain.   ACTIVITY LIMITATIONS: carrying, lifting,  bending, sitting, standing, squatting, sleeping, stairs, transfers, and locomotion level  PARTICIPATION LIMITATIONS: meal prep, cleaning, laundry, driving, community activity, and yard work  PERSONAL FACTORS: Fitness and 3+ comorbidities: see PMH are also affecting patient's functional outcome.   REHAB POTENTIAL: Good  CLINICAL DECISION MAKING: Stable/uncomplicated  EVALUATION COMPLEXITY: Low   GOALS: Goals reviewed with patient? Yes  SHORT TERM GOALS: (target date for Short term goals 07/15/2022)   1.  Patient will demonstrate independent use of home exercise program to maintain progress from in clinic treatments.  Goal status: Met 07/01/22  2. Right knee PROM -5* ext to 95* flexion  Goal Status: Partially Met 07/06/22  LONG TERM GOALS: (target dates for all long term goals are 10 weeks  08/26/2022 )   1. Patient will demonstrate/report pain at worst less than or equal to 2/10 to facilitate minimal limitation in daily activity secondary to pain symptoms.  Goal status: New   2. Patient will demonstrate independent use of home exercise program to facilitate ability to maintain/progress functional gains from skilled physical therapy services.  Goal status: New   3. Patient will demonstrate FOTO outcome > or = 55 % to indicate reduced disability due to condition.  Goal status: New   4.  Patient will demonstrate right knee LE MMT 5/5 throughout to faciltiate usual transfers, stairs, squatting at [facility] for daily life.   Goal status: New   5.  Patient right knee AROM 0* ext to 100* flexion. Goal status: New   6.  pt ambulates >500', negotiates ramps & curbs without device and stairs single rail modified independent.  Goal status: New   7.  pt demonstrates activities to enable to return to yard work as pt goal.  Goal Status: New   PLAN:  PT FREQUENCY: 2-3x/week  PT DURATION: 10 weeks  PLANNED INTERVENTIONS: Therapeutic exercises, Therapeutic activity, Neuromuscular  re-education, Balance training, Gait training, Patient/Family education, Self Care, Joint mobilization, Stair training, Vestibular training, DME instructions, Electrical stimulation, Cryotherapy, Moist heat, scar mobilization, Taping, Vasopneumatic device, Ultrasound, Manual therapy, and Re-evaluation  PLAN FOR NEXT SESSION: check strength with dynameter,  work towards [place],  therapeutic exercise for strength & balance,  Quad activation, Closed chain functional exercises, vaso to end PRN    Jamey Reas, PT, DPT 08/09/2022, 12:42 PM

## 2022-08-12 ENCOUNTER — Encounter: Payer: PPO | Admitting: Physical Therapy

## 2022-08-16 ENCOUNTER — Encounter: Payer: Self-pay | Admitting: Physical Therapy

## 2022-08-16 ENCOUNTER — Ambulatory Visit: Payer: PPO | Admitting: Physical Therapy

## 2022-08-16 DIAGNOSIS — M25661 Stiffness of right knee, not elsewhere classified: Secondary | ICD-10-CM

## 2022-08-16 DIAGNOSIS — M6281 Muscle weakness (generalized): Secondary | ICD-10-CM

## 2022-08-16 DIAGNOSIS — M25561 Pain in right knee: Secondary | ICD-10-CM | POA: Diagnosis not present

## 2022-08-16 DIAGNOSIS — R600 Localized edema: Secondary | ICD-10-CM

## 2022-08-16 DIAGNOSIS — R2689 Other abnormalities of gait and mobility: Secondary | ICD-10-CM | POA: Diagnosis not present

## 2022-08-16 DIAGNOSIS — R2681 Unsteadiness on feet: Secondary | ICD-10-CM

## 2022-08-16 NOTE — Therapy (Signed)
OUTPATIENT PHYSICAL THERAPY TREATMENT NOTE   Patient Name: Bryan Hart MRN: [id number] DOB:1951/01/18, 72 y.o., male Today's Date: 08/16/2022    END OF SESSION:   PT End of Session - 08/16/22 1010     Visit Number 18    Number of Visits 25    Date for PT Re-Evaluation 08/26/22    Authorization Type [employer]    Authorization Time Period $25 co-pay    Progress Note Due on Visit 12    PT Start Time 1010    PT Stop Time 1049    PT Time Calculation (min) 39 min    Activity Tolerance Patient tolerated treatment well;No increased pain    Behavior During Therapy [facility] for tasks assessed/performed                      Past Medical History:  Diagnosis Date   Arthritis    HLD (hyperlipidemia)    Hypertension    dr  ed green      stress test   12 yrs ago   PVC's (premature ventricular contractions) 12 years ago   stress test done   Sleep apnea    Stroke [facility])    minor stroke 01/2018   Past Surgical History:  Procedure Laterality Date   ANKLE ARTHROSCOPY  06/27/2008   JOINT REPLACEMENT  06/27/2009   rt shoulder rotator cuff repair   knee surgeries  [id number]   x2   SHOULDER HEMI-ARTHROPLASTY  01/12/2012   Procedure: SHOULDER HEMI-ARTHROPLASTY;  Surgeon: Nita Sells, MD;  Location: [place];  Service: Orthopedics;  Laterality: Right;   SHOULDER SURGERY Right 07/2021   TOTAL KNEE ARTHROPLASTY Right 05/31/2022   Procedure: RIGHT TOTAL KNEE ARTHROPLASTY;  Surgeon: Mcarthur Rossetti, MD;  Location: [place];  Service: Orthopedics;  Laterality: Right;   Patient Active Problem List   Diagnosis Date Noted   Asymptomatic PVCs 07/26/2022   Hyperlipidemia 07/26/2022   Family history of heart disease 07/26/2022   Essential hypertension 07/26/2022   Status post total right knee replacement 05/31/2022   Unilateral primary osteoarthritis, left knee 03/28/2022   Unilateral primary osteoarthritis, right knee 03/28/2022   Primary osteoarthritis of left knee  04/30/2020   Effusion, right knee 03/31/2020   Degenerative arthritis of right knee 03/31/2020   Bilateral primary osteoarthritis of knee 12/27/2018   AV block 07/29/2018   Central sleep apnea 07/29/2018   Cerebrovascular accident ([place]) 07/29/2018   Primary localized osteoarthrosis, shoulder region 01/13/2012     THERAPY DIAG:  Acute pain of right knee  Stiffness of right knee, not elsewhere classified  Muscle weakness (generalized)  Other abnormalities of gait and mobility  Unsteadiness on feet  Localized edema   PCP: Sueanne Margarita, DO  REFERRING PROVIDER: Jean Rosenthal, MD  REFERRING DIAG:  M17.11 (ICD-10-CM) - Unilateral primary osteoarthritis, right knee  Z96.651 (ICD-10-CM) - Status post total right knee replacement    EVAL THERAPY DIAG:  Acute pain of right knee  Muscle weakness (generalized)  Stiffness of right knee, not elsewhere classified  Localized edema  Other abnormalities of gait and mobility  Unsteadiness on feet  Rationale for Evaluation and Treatment: Rehabilitation  ONSET DATE:  05/31/2022 Right TKA  SUBJECTIVE:   SUBJECTIVE STATEMENT: Doing well, still has concerns about stairs.    PERTINENT HISTORY: OA, Right shoulder hemiarthroplasty, AV block, CVA 01/2018, HTN  PAIN:  Are you having pain? Yes: NPRS scale: today right knee 0/10 & in last week 0/10 - 2/10  and  left knee today 3/10 & in last week ranging 0/10 - 5/10 Pain location: right knee anterior and lateral Pain description: sore  Aggravating factors: twisting / turning Relieving factors: sit down, massage, meds, ice  PRECAUTIONS: None  WEIGHT BEARING RESTRICTIONS: No  FALLS:  Has patient fallen in last 6 months? No    LIVING ENVIRONMENT: Lives with: lives with their spouse Lives in: [place] 2-story half bath downstairs, bedrooms & bathrooms are upstairs. Stairs: Yes: Internal: 13 steps; on right going up and External: 1 steps; none Has following equipment at home:  Single point cane, Walker - 2 wheeled, shower chair, and Grab bars  OCCUPATION: retired   PLOF: Independent, [place] with household mobility without device, and Independent with community mobility without device  PATIENT GOALS:   function without pain, improve walking, yard work  NEXT MD VISIT: 07/13/2022  OBJECTIVE: (all measures taken from initial evaluation unless otherwise dated)  DIAGNOSTIC FINDINGS: 05/31/2022 post-op X-ray Evidence of patient's recent right total knee replacement with prosthetic components intact and normally located. Mild air and fluid within the knee joint. Skin staples over the anterior soft tissues vertically just left of midline.  PATIENT SURVEYS:  EVAL: FOTO intake: 47%   predicted:  55% 07/18/22: FOTO 64  EDEMA:  EVAL: RLE: above knee 49.8cm  around knee 48.9cm below knee  42.8cm LLE:  above knee 41.8cm  around knee 44cm  below knee  35cm  POSTURE: rounded shoulders, forward head, flexed trunk , and weight shift left  PALPATION: Tenderness along incision, patella borders, joint line, distal quads and proximal gastroc.   LOWER EXTREMITY ROM:   ROM Right eval Right 06/22/22 Right 06/29/22 Right 07/01/22 Right 07/06/22 Right 07/11/22 Right 07/18/22 Right 07/28/22 Right 08/02/22 Right 08-26-22  Knee flexion Seated A: 83* P: 86* Supine A: 105 P: 112 Seated P: 115* A: 107* Seated A: 110 P: 118 Supine:  A: 113 Supine A:115*      Knee extension Supine: P: -11* A: quad set -14* Seated A: LAQ -34* A: Seated LAQ  -14 P: Supine heel prop -8 Seated  A: LAQ -12* Standing A: -8* Seated A: -12 P: -10 Seated A: -12  Supine A: -10* Seated LAQ: -7 Seated LAQ -6* Seated LAQ -5* Standing A: -2* Standing A: -1*   (Blank rows = not tested)  LOWER EXTREMITY MMT:  MMT Right eval Left 07/14/2022 Right 07/14/2022  Knee flexion 3-/5 HH Dynameter 24.1#, 23.7# HH Dynameter 23.2#, 21.8# RLE:LLE 94%  Knee extension 3-/5 HH Dynameter 36.5#, 37.6#  HH Dynameter 31.8#, 37.9# RLE:LLE  94%   (Blank rows = not tested)  FUNCTIONAL TESTS:  18 inch chair transfer: requires use of UEs on armrests and RW to stabilize  GAIT: 1:15/2024: pt amb without assistive device >200' safely.   06/14/2022: Distance walked: 100' Assistive device utilized: Environmental consultant - 2 wheeled Level of assistance: SBA Comments: antalgic pattern with decreased RLE stance, right knee flexed in stance, significantly limited in knee flexion for swing - walks with stiff LE,    TODAY'S TREATMENT [facility] Adult PT Treatment: DATE:  08/16/22 Therapeutic Exercise: Recumbent bike x 8 min level 4; seat 8 Leg Press bil 125# 3x10; RLE only 68# 2x10 Single leg knee extension 5# 3x10; performed bil Single leg knee flexion 15# 3x10; performed bil Kettle bell dead lift 12# 3x10  08/26/2022: Therapeutic Exercise: Recumbent bike x 8 min level 4; seat 8 Leg press BLEs 125# 15 reps with 5 sec hold ext;  Marching for SLS knee ext  stance 100# 10 reps march 2 sets;  RLE only 62# 15 reps. LLE only 50# 15 reps;  Gastroc stretch on incline board straddle step 30 sec hold 2 reps ea LE Heel raises on incline board for greater range 20 reps light BUE support.  Hamstring stretch SLR strap 30 sec 2 reps ea LE.  Squat over chair with BUE on counter 10 reps.   Neuromuscular Re-education:  Standing on foam with ipsilateral UE support SLS reaching contralateral LE 5 directions: across anteriorly, anteriorly, laterally, posteriorly & across posteriorly 3 reps ea LE.     08/04/2022: Therapeutic Exercise: PT educated pt on need for ongoing fitness to include components of flexibility, strength, endurance & balance. PT discussed older adult classes at [facility] that he might benefit from.  Pt verbalized understanding.  Recumbent bike x 8 min level 4; seat 8 Leg press BLEs 125# 15 reps with 5 sec hold ext;  Marching for SLS knee ext stance 100# 10 reps march 3 sets;  RLE only 62# 15 reps. LLE only 50# 15 reps;   Gastroc stretch on step heel depression 30 sec hold 2 reps ea LE Function squat 5 reps picking up items from floor with PT cues. Squat over chair with BUE on counter 5 reps.   Neuromuscular Re-education:  Tandem stance at sink:  counting out loud to 30 ea LE in front 1 reps without touch required (stop count when touching). SLS modified with contralateral forefoot in lower cabinet. counting out loud to 30 ea LE in front 2 reps with intermittent touch required (stop count when touching). Slider to 3 cones (ant-lat, lat & post-lat) with stance knee flexion on outward motion & extension inward motion.  He requires UE assist. 5 reps ea LE.    08/01/2022: Therapeutic Exercise: Recumbent bike x 8 min level 4; seat 8 Leg press BLEs 125# 15 reps with 5 sec hold ext;  Marching for SLS knee ext stance 100# 10 reps march 3 sets;  RLE only 67# 15 reps.  Gastroc stretch on step heel depression 30 sec hold 2 reps ea LE Function squat 5 reps picking up items from floor with PT cues. Seated LAQ & active flexion with contralateral LE opposing motion 5 sec hold 15 reps.  Standing knee flexion alternating LEs with cues of stance LE knee ext. 10 reps ea LE.   Neuromuscular Re-education:  Tandem stance at sink:  counting out loud to 30 ea LE in front 2 reps with intermittent touch required (stop count when touching). SLS modified with contralateral forefoot in lower cabinet. counting out loud to 30 ea LE in front 2 reps with intermittent touch required (stop count when touching). Slider to 3 cones (ant-lat, lat & post-lat) with stance knee flexion on outward motion & extension inward motion.  He requires UE assist. 5 reps ea LE.     PATIENT EDUCATION:  Education details: HEP review, POC, rationale for interventions Person educated: Patient Education method: Explanation, Demonstration, Verbal cues, and Handouts Education comprehension: verbalized understanding, returned demonstration, and verbal cues  required  HOME EXERCISE PROGRAM: Access Code: [email or web address] URL: [email or web address] Date: 08/16/2022 Prepared by: Faustino Congress  Exercises - Ankle Alphabet in Elevation  - 2-4 x daily - 7 x weekly - 1 sets - 1 reps - Quad Setting and Stretching  - 2-4 x daily - 7 x weekly - 5-10 sets - 10 reps - prop 5-10 minutes & quad set5 seconds hold - Supine Heel Slide with Strap  - 2-3  x daily - 7 x weekly - 2-3 sets - 10 reps - 5 seconds hold - Hooklying Hamstring Stretch with Strap  - 2-4 x daily - 7 x weekly - 1 sets - 3 reps - 20-30 seconds hold - Seated Knee Flexion Extension AROM   - 2-4 x daily - 7 x weekly - 2-3 sets - 10 reps - 5 seconds hold - Gastroc Stretch on Step  - 2-4 x daily - 7 x weekly - 1 sets - 3 reps - 20-30 seconds hold - Seated Straight Leg Raise  - 4-5 x daily - 7 x weekly - 1 sets - 5 reps - 3 sec hold - squat at sink with chair behind you  - 2 x daily - 7 x weekly - 2-3 sets - 10 reps - 5 seconds hold - Single Leg Press  - 1 x daily - 7 x weekly - 3 sets - 10 reps - Single Leg Knee Extension with Weight Machine  - 1 x daily - 7 x weekly - 3 sets - 10 reps - Single Leg Hamstring Curl with Weight Machine  - 1 x daily - 7 x weekly - 3 sets - 10 reps - Kettlebell Deadlift  - 1 x daily - 7 x weekly - 3 sets - 10 reps  ASSESSMENT: CLINICAL IMPRESSION:   Session today focused on establishing gym program for pt to continue at d/c from PT.  Feel he is nearing d/c at this time, so working on compliance with community fitness at d/c.  Will continue to benefit from PT to maximize function.  OBJECTIVE IMPAIRMENTS: Abnormal gait, decreased activity tolerance, decreased balance, decreased endurance, decreased knowledge of condition, decreased knowledge of use of DME, decreased mobility, difficulty walking, decreased ROM, decreased strength, increased edema, increased muscle spasms, postural dysfunction, and pain.   ACTIVITY LIMITATIONS: carrying, lifting, bending,  sitting, standing, squatting, sleeping, stairs, transfers, and locomotion level  PARTICIPATION LIMITATIONS: meal prep, cleaning, laundry, driving, community activity, and yard work  PERSONAL FACTORS: Fitness and 3+ comorbidities: see PMH are also affecting patient's functional outcome.   REHAB POTENTIAL: Good  CLINICAL DECISION MAKING: Stable/uncomplicated  EVALUATION COMPLEXITY: Low   GOALS: Goals reviewed with patient? Yes  SHORT TERM GOALS: (target date for Short term goals 07/15/2022)   1.  Patient will demonstrate independent use of home exercise program to maintain progress from in clinic treatments.  Goal status: Met 07/01/22  2. Right knee PROM -5* ext to 95* flexion  Goal Status: Partially Met 07/06/22  LONG TERM GOALS: (target dates for all long term goals are 10 weeks  08/26/2022 )   1. Patient will demonstrate/report pain at worst less than or equal to 2/10 to facilitate minimal limitation in daily activity secondary to pain symptoms.  Goal status: New   2. Patient will demonstrate independent use of home exercise program to facilitate ability to maintain/progress functional gains from skilled physical therapy services.  Goal status: New   3. Patient will demonstrate FOTO outcome > or = 55 % to indicate reduced disability due to condition.  Goal status: New   4.  Patient will demonstrate right knee LE MMT 5/5 throughout to faciltiate usual transfers, stairs, squatting at [facility] for daily life.   Goal status: New   5.  Patient right knee AROM 0* ext to 100* flexion. Goal status: New   6.  pt ambulates >500', negotiates ramps & curbs without device and stairs single rail modified independent.  Goal status: New  7.  pt demonstrates activities to enable to return to yard work as pt goal.  Goal Status: New   PLAN:  PT FREQUENCY: 2-3x/week  PT DURATION: 10 weeks  PLANNED INTERVENTIONS: Therapeutic exercises, Therapeutic activity, Neuromuscular re-education,  Balance training, Gait training, Patient/Family education, Self Care, Joint mobilization, Stair training, Vestibular training, DME instructions, Electrical stimulation, Cryotherapy, Moist heat, scar mobilization, Taping, Vasopneumatic device, Ultrasound, Manual therapy, and Re-evaluation  PLAN FOR NEXT SESSION: see how gym program is going, update/adjust HEP PRN, eccentric quad     Faustino Congress, PT, DPT 08/16/2022, 10:58 AM

## 2022-08-17 ENCOUNTER — Encounter: Payer: Self-pay | Admitting: Cardiology

## 2022-08-17 ENCOUNTER — Ambulatory Visit: Payer: PPO | Attending: Cardiology | Admitting: Cardiology

## 2022-08-17 VITALS — BP 136/78 | HR 75 | Ht 72.0 in | Wt 178.8 lb

## 2022-08-17 DIAGNOSIS — I10 Essential (primary) hypertension: Secondary | ICD-10-CM | POA: Diagnosis not present

## 2022-08-17 DIAGNOSIS — G4733 Obstructive sleep apnea (adult) (pediatric): Secondary | ICD-10-CM

## 2022-08-17 NOTE — Progress Notes (Signed)
Sleep Medicine  CONSULT Note    Date:  08/17/2022   ID:  [name], DOB 10-31-1950, MRN [id number]  PCP:  Sueanne Margarita, DO  Cardiologist:  Fransico Him, MD   Chief Complaint  Patient presents with   New Patient (Initial Visit)    OSA    History of Present Illness:  Bryan Hart is a 72 y.o. male who is being seen today for the evaluation of OSA at the request of Lorretta Harp, MD.  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  He also has a history of remote TIA.  He has a history of obstructive sleep apnea but apparently has been intolerant to CPAP in the past.  He is now referred to sleep medicine for further evaluation of obstructive sleep apnea  He has his initial sleep study in 2020.  He was seen by Dr. Brett Fairy who stated in her not that he was a restless sleeper and also experienced sleep talking and sleep laughing but did not snore.  His sleep study demonstrated mild OSA with an AHI of 13.2/hr mainly in NREM sleep.  Supine AHI was 26.1/hr compared to non supine at 4.1/hr. He underwent CPAP titration but developed significant central events at higher pressure including a trial of  to BiPAP and BiPAP ST.  It was recommended that he go back to sleep lab for ASV titration.   He did go on PAP therapy although I cannot discern from the notes whether it was a CPAP or BiPAP device.  He tried it for several months but really struggled and could not sleep with it.    He tells me that he feels rested when he gets up in the am and for the past 5 years has felt sleepy during the day and daytimes naps have become part of his routine.  His wife tells him that he snores but no witnessed apneas during sleep.  He has no am HAs.    Past Medical History:  Diagnosis Date   Arthritis    HLD (hyperlipidemia)    Hypertension    dr  ed green      stress test   12 yrs ago   PVC's (premature ventricular contractions)  12 years ago   stress test done   Sleep apnea    Stroke [facility])    minor stroke 01/2018    Past Surgical History:  Procedure Laterality Date   ANKLE ARTHROSCOPY  06/27/2008   JOINT REPLACEMENT  06/27/2009   rt shoulder rotator cuff repair   knee surgeries  [id number]   x2   SHOULDER HEMI-ARTHROPLASTY  01/12/2012   Procedure: SHOULDER HEMI-ARTHROPLASTY;  Surgeon: Nita Sells, MD;  Location: [place];  Service: Orthopedics;  Laterality: Right;   SHOULDER SURGERY Right 07/2021   TOTAL KNEE ARTHROPLASTY Right 05/31/2022   Procedure: RIGHT TOTAL KNEE ARTHROPLASTY;  Surgeon: Mcarthur Rossetti, MD;  Location: [place];  Service: Orthopedics;  Laterality: Right;    Current Medications: Current Meds  Medication Sig   aspirin EC 81 MG tablet Take 81 mg by mouth daily. Swallow whole.   atorvastatin (LIPITOR) 20 MG tablet Take 20 mg by mouth daily at 6 PM.   methocarbamol (ROBAXIN) 500 MG tablet Take 1 tablet (500 mg total) by mouth every 6 (six) hours as needed for muscle spasms.   verapamil (VERELAN PM) 180 MG 24 hr capsule Take 180 mg by mouth daily.  Allergies:   Adhesive [tape]   Social History   Socioeconomic History   Marital status: Married    Spouse name: Bryan Hart   Number of children: Not on file   Years of education: Not on file   Highest education level: Master's degree (e.g., MA, MS, MEng, MEd, MSW, MBA)  Occupational History   Not on file  Tobacco Use   Smoking status: Former    Packs/day: 0.50    Years: 4.00    Total pack years: 2.00    Types: Cigarettes    Quit date: 1975    Years since quitting: 49.1   Smokeless tobacco: Never  Vaping Use   Vaping Use: Never used  Substance and Sexual Activity   Alcohol use: Yes    Alcohol/week: 1.0 - 2.0 standard drink of alcohol    Types: 1 - 2 Cans of beer per week    Comment: daily   Drug use: No   Sexual activity: Not on file  Other Topics Concern   Not on file  Social History Narrative   Lives home  with spouse Bryan Hart.   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   Left handed    Social Determinants of Health   Financial Resource Strain: Not on file  Food Insecurity: Not on file  Transportation Needs: Not on file  Physical Activity: Not on file  Stress: Not on file  Social Connections: Not on file     Family History:  The patient's family history includes Heart attack in his brother and maternal uncle; Heart disease in his brother and father; Hypertension in his brother and father; Stroke in his paternal grandmother.   ROS:   Please see the history of present illness.    ROS All other systems reviewed and are negative.      No data to display             PHYSICAL EXAM:   VS:  BP 136/78   Pulse 75   Ht 6' (1.829 m)   Wt 178 lb 12.8 oz (81.1 kg)   SpO2 97%   BMI 24.25 kg/m    GEN: Well nourished, well developed, in no acute distress  HEENT: normal  Neck: no JVD, carotid bruits, or masses Cardiac: RRR; no murmurs, rubs, or gallops,no edema.  Intact distal pulses bilaterally.  Respiratory:  clear to auscultation bilaterally, normal work of breathing GI: soft, nontender, nondistended, + BS MS: no deformity or atrophy  Skin: warm and dry, no rash Neuro:  Alert and Oriented x 3, Strength and sensation are intact Psych: euthymic mood, full affect  Wt Readings from Last 3 Encounters:  08/17/22 178 lb 12.8 oz (81.1 kg)  07/26/22 176 lb 12.8 oz (80.2 kg)  05/31/22 175 lb (79.4 kg)      Studies/Labs Reviewed:   EKG:  EKG is not ordered today.    Recent Labs: 05/24/2022: ALT 18 06/01/2022: BUN 17; Creatinine, Ser 0.98; Hemoglobin 11.5; Platelets 266; Potassium 3.8; Sodium 136   Lipid Panel No results found for: "CHOL", "TRIG", "HDL", "CHOLHDL", "VLDL", "LDLCALC", "LDLDIRECT"   Additional studies/ records that were reviewed today include:  none    ASSESSMENT:    1. OSA (obstructive sleep apnea)   2. Essential hypertension      PLAN:  In order of  problems listed above:  OSA -he has had a dx of OSA in the past by Dr. Brett Fairy who stated in her not that he was a restless sleeper and also experienced  sleep talking and sleep laughing but did not snore.  His sleep study demonstrated mild OSA with an AHI of 13.2/hr mainly in NREM sleep.  Supine AHI was 26.1/hr compared to non supine at 4.1/hr. He underwent CPAP titration but developed significant central events at higher pressure including a trial of  to BiPAP and BiPAP ST.  It was recommended that he go back to sleep lab for ASV titration.  -I cannot tell what PAP device he was placed on but he did not tolerate it due to very poor sleep and very restless sleep.  He does have to nap during the day -I have recommended that he undergo split night sleep study to assess degree of OSA.  If he has an AHI >15hr he would likely be a candidate for the Inspire device as long as his apneas are primarily obstructive.    2.  HTN -BP controlled on exam today -Continue prescription drug management with verapamil 180 mg daily with as needed refills  Time Spent: 20 minutes total time of encounter, including 15 minutes spent in face-to-face patient care on the date of this encounter. This time includes coordination of care and counseling regarding above mentioned problem list. Remainder of non-face-to-face time involved reviewing chart documents/testing relevant to the patient encounter and documentation in the medical record. I have independently reviewed documentation from referring provider  Medication Adjustments/Labs and Tests Ordered: Current medicines are reviewed at length with the patient today.  Concerns regarding medicines are outlined above.  Medication changes, Labs and Tests ordered today are listed in the Patient Instructions below.  There are no Patient Instructions on file for this visit.   Signed, Fransico Him, MD  08/17/2022 3:10 PM    [facility] [address] [place],  [place], [place]  [address] Phone: [phone]; Fax: [phone]  ,

## 2022-08-17 NOTE — Patient Instructions (Signed)
Medication Instructions:  Your physician recommends that you continue on your current medications as directed. Please refer to the Current Medication list given to you today.  *If you need a refill on your cardiac medications before your next appointment, please call your pharmacy*   Lab Work: None.  If you have labs (blood work) drawn today and your tests are completely normal, you will receive your results only by: [place] (if you have MyChart) OR A paper copy in the mail If you have any lab test that is abnormal or we need to change your treatment, we will call you to review the results.   Testing/Procedures: Your physician has recommended that you have a sleep study. This test records several body functions during sleep, including: brain activity, eye movement, oxygen and carbon dioxide blood levels, heart rate and rhythm, breathing rate and rhythm, the flow of air through your mouth and nose, snoring, body muscle movements, and chest and belly movement.    Follow-Up: At [facility], you and your health needs are our priority.  As part of our continuing mission to provide you with exceptional heart care, we have created designated Provider Care Teams.  These Care Teams include your primary Cardiologist (physician) and Advanced Practice Providers (APPs -  Physician Assistants and Nurse Practitioners) who all work together to provide you with the care you need, when you need it.  We recommend signing up for the patient portal called "MyChart".  Sign up information is provided on this After Visit Summary.  MyChart is used to connect with patients for Virtual Visits (Telemedicine).  Patients are able to view lab/test results, encounter notes, upcoming appointments, etc.  Non-urgent messages can be sent to your provider as well.   To learn more about what you can do with MyChart, go to [email or web address].    Your next appointment will be dependent up on the results of  your sleep study and it will be with:    Provider:   Dr. Fransico Him, MD

## 2022-08-17 NOTE — Addendum Note (Signed)
Addended by: Joni Reining on: 08/17/2022 03:42 PM   Modules accepted: Orders

## 2022-08-18 ENCOUNTER — Ambulatory Visit: Payer: PPO | Admitting: Physical Therapy

## 2022-08-18 ENCOUNTER — Encounter: Payer: Self-pay | Admitting: Physical Therapy

## 2022-08-18 DIAGNOSIS — R2689 Other abnormalities of gait and mobility: Secondary | ICD-10-CM

## 2022-08-18 DIAGNOSIS — M6281 Muscle weakness (generalized): Secondary | ICD-10-CM | POA: Diagnosis not present

## 2022-08-18 DIAGNOSIS — R2681 Unsteadiness on feet: Secondary | ICD-10-CM

## 2022-08-18 DIAGNOSIS — R600 Localized edema: Secondary | ICD-10-CM | POA: Diagnosis not present

## 2022-08-18 DIAGNOSIS — M25661 Stiffness of right knee, not elsewhere classified: Secondary | ICD-10-CM

## 2022-08-18 DIAGNOSIS — M25561 Pain in right knee: Secondary | ICD-10-CM | POA: Diagnosis not present

## 2022-08-18 NOTE — Therapy (Signed)
OUTPATIENT PHYSICAL THERAPY TREATMENT NOTE PROGRESS NOTE Progress Note Reporting Period 07/14/22 to 08/18/22  See note below for Objective Data and Assessment of Progress/Goals.    Patient Name: Bryan Hart MRN: [id number] DOB:[birth year], 72 y.o., male Today's Date: 08/18/2022    END OF SESSION:   PT End of Session - 08/18/22 1009     Visit Number 19    Number of Visits 25    Date for PT Re-Evaluation 08/26/22    Authorization Type [employer]    Authorization Time Period $25 co-pay    Progress Note Due on Visit 64    PT Start Time 1006    PT Stop Time 1046    PT Time Calculation (min) 40 min    Activity Tolerance Patient tolerated treatment well;No increased pain    Behavior During Therapy [facility] for tasks assessed/performed                       Past Medical History:  Diagnosis Date   Arthritis    HLD (hyperlipidemia)    Hypertension    dr  ed green      stress test   12 yrs ago   PVC's (premature ventricular contractions) 12 years ago   stress test done   Sleep apnea    Stroke [facility])    minor stroke 01/2018   Past Surgical History:  Procedure Laterality Date   ANKLE ARTHROSCOPY  06/27/2008   JOINT REPLACEMENT  06/27/2009   rt shoulder rotator cuff repair   knee surgeries  [id number]   x2   SHOULDER HEMI-ARTHROPLASTY  01/12/2012   Procedure: SHOULDER HEMI-ARTHROPLASTY;  Surgeon: Nita Sells, MD;  Location: [place];  Service: Orthopedics;  Laterality: Right;   SHOULDER SURGERY Right 07/2021   TOTAL KNEE ARTHROPLASTY Right 05/31/2022   Procedure: RIGHT TOTAL KNEE ARTHROPLASTY;  Surgeon: Mcarthur Rossetti, MD;  Location: [place];  Service: Orthopedics;  Laterality: Right;   Patient Active Problem List   Diagnosis Date Noted   Asymptomatic PVCs 07/26/2022   Hyperlipidemia 07/26/2022   Family history of heart disease 07/26/2022   Essential hypertension 07/26/2022   Status post total right knee replacement 05/31/2022   Unilateral primary  osteoarthritis, left knee 03/28/2022   Unilateral primary osteoarthritis, right knee 03/28/2022   Primary osteoarthritis of left knee 04/30/2020   Effusion, right knee 03/31/2020   Degenerative arthritis of right knee 03/31/2020   Bilateral primary osteoarthritis of knee 12/27/2018   AV block 07/29/2018   Central sleep apnea 07/29/2018   Cerebrovascular accident ([place]) 07/29/2018   Primary localized osteoarthrosis, shoulder region 01/13/2012     THERAPY DIAG:  Acute pain of right knee  Stiffness of right knee, not elsewhere classified  Muscle weakness (generalized)  Unsteadiness on feet  Other abnormalities of gait and mobility  Localized edema   PCP: Sueanne Margarita, DO  REFERRING PROVIDER: Jean Rosenthal, MD  REFERRING DIAG:  M17.11 (ICD-10-CM) - Unilateral primary osteoarthritis, right knee  Z96.651 (ICD-10-CM) - Status post total right knee replacement    EVAL THERAPY DIAG:  Acute pain of right knee  Muscle weakness (generalized)  Stiffness of right knee, not elsewhere classified  Localized edema  Other abnormalities of gait and mobility  Unsteadiness on feet  Rationale for Evaluation and Treatment: Rehabilitation  ONSET DATE:  05/31/2022 Right TKA  SUBJECTIVE:   SUBJECTIVE STATEMENT: Surgery knee is doing well; other one is giving him some trouble; feels he could do yardwork, but hasn't because of the  winter  PERTINENT HISTORY: OA, Right shoulder hemiarthroplasty, AV block, CVA 01/2018, HTN  PAIN:  Are you having pain? Yes: NPRS scale: today right knee 0/10 & in last week 0/10 - 2/10  and left knee today 3/10 & in last week ranging 0/10 - 5/10 Pain location: right knee anterior and lateral Pain description: sore  Aggravating factors: twisting / turning Relieving factors: sit down, massage, meds, ice  PRECAUTIONS: None  WEIGHT BEARING RESTRICTIONS: No  FALLS:  Has patient fallen in last 6 months? No    LIVING ENVIRONMENT: Lives with:  lives with their spouse Lives in: [place] 2-story half bath downstairs, bedrooms & bathrooms are upstairs. Stairs: Yes: Internal: 13 steps; on right going up and External: 1 steps; none Has following equipment at home: Single point cane, Walker - 2 wheeled, shower chair, and Grab bars  OCCUPATION: retired   PLOF: Independent, [place] with household mobility without device, and Independent with community mobility without device  PATIENT GOALS:   function without pain, improve walking, yard work  OBJECTIVE: (all measures taken from initial evaluation unless otherwise dated)  DIAGNOSTIC FINDINGS: 05/31/2022 post-op X-ray Evidence of patient's recent right total knee replacement with prosthetic components intact and normally located. Mild air and fluid within the knee joint. Skin staples over the anterior soft tissues vertically just left of midline.  PATIENT SURVEYS:  EVAL: FOTO intake: 47%   predicted:  55% 07/18/22: FOTO 64 08/18/22: FOTO 70  EDEMA:  EVAL: RLE: above knee 49.8cm  around knee 48.9cm below knee  42.8cm LLE:  above knee 41.8cm  around knee 44cm  below knee  35cm  POSTURE: rounded shoulders, forward head, flexed trunk , and weight shift left  PALPATION: Tenderness along incision, patella borders, joint line, distal quads and proximal gastroc.   LOWER EXTREMITY ROM:   ROM Right eval Right 06/22/22 Right 06/29/22 Right 07/01/22 Right 07/06/22 Right 07/11/22 Right 07/18/22 Right 07/28/22 Right 08/02/22 Right 08/09/22 Right 08/18/22  Knee flexion Seated A: 83* P: 86* Supine A: 105 P: 112 Seated P: 115* A: 107* Seated A: 110 P: 118 Supine:  A: 113 Supine A:115*     Supine A: 115  Knee extension Supine: P: -11* A: quad set -14* Seated A: LAQ -34* A: Seated LAQ  -14 P: Supine heel prop -8 Seated  A: LAQ -12* Standing A: -8* Seated A: -12 P: -10 Seated A: -12  Supine A: -10* Seated LAQ: -7 Seated LAQ -6* Seated LAQ -5* Standing A: -2* Standing A: -1*  Seated LAQ: 0   (Blank rows = not tested)  LOWER EXTREMITY MMT:  MMT Right eval Left 07/14/2022 Right 07/14/2022 Right 08/18/22  Knee flexion 3-/5 HH Dynameter 24.1#, 23.7# HH Dynameter 23.2#, 21.8# RLE:LLE 94% 5/5  Knee extension 3-/5 HH Dynameter 36.5#, 37.6# HH Dynameter 31.8#, 37.9# RLE:LLE  94% 45.2, 43.0 44.1#   (Blank rows = not tested)  FUNCTIONAL TESTS:  18 inch chair transfer: requires use of UEs on armrests and RW to stabilize  GAIT: 1:15/2024: pt amb without assistive device >200' safely.   06/14/2022: Distance walked: 100' Assistive device utilized: Environmental consultant - 2 wheeled Level of assistance: SBA Comments: antalgic pattern with decreased RLE stance, right knee flexed in stance, significantly limited in knee flexion for swing - walks with stiff LE,    TODAY'S TREATMENT [facility] Adult PT Treatment: DATE:  08/18/22 Therapeutic Exercise: Recumbent bike x 8 min level 4; seat 8 Heel taps off 4" step 2x10; bil Leg Press bil 125# 3x10; RLE  only 68# 2x10 AROM and dynamometer measurements - see above Single leg knee extension 5# 3x10; performed bil   08/16/22 Therapeutic Exercise: Recumbent bike x 8 min level 4; seat 8 Leg Press bil 125# 3x10; RLE only 68# 2x10 Single leg knee extension 5# 3x10; performed bil Single leg knee flexion 15# 3x10; performed bil Kettle bell dead lift 12# 3x10  2022-08-19: Therapeutic Exercise: Recumbent bike x 8 min level 4; seat 8 Leg press BLEs 125# 15 reps with 5 sec hold ext;  Marching for SLS knee ext stance 100# 10 reps march 2 sets;  RLE only 62# 15 reps. LLE only 50# 15 reps;  Gastroc stretch on incline board straddle step 30 sec hold 2 reps ea LE Heel raises on incline board for greater range 20 reps light BUE support.  Hamstring stretch SLR strap 30 sec 2 reps ea LE.  Squat over chair with BUE on counter 10 reps.   Neuromuscular Re-education:  Standing on foam with ipsilateral UE support SLS reaching contralateral LE 5  directions: across anteriorly, anteriorly, laterally, posteriorly & across posteriorly 3 reps ea LE.       PATIENT EDUCATION:  Education details: HEP review, POC, rationale for interventions Person educated: Patient Education method: Explanation, Demonstration, Verbal cues, and Handouts Education comprehension: verbalized understanding, returned demonstration, and verbal cues required  HOME EXERCISE PROGRAM: Access Code: [email or web address] URL: [email or web address] Date: 08/16/2022 Prepared by: Faustino Congress  Exercises - Ankle Alphabet in Elevation  - 2-4 x daily - 7 x weekly - 1 sets - 1 reps - Quad Setting and Stretching  - 2-4 x daily - 7 x weekly - 5-10 sets - 10 reps - prop 5-10 minutes & quad set5 seconds hold - Supine Heel Slide with Strap  - 2-3 x daily - 7 x weekly - 2-3 sets - 10 reps - 5 seconds hold - Hooklying Hamstring Stretch with Strap  - 2-4 x daily - 7 x weekly - 1 sets - 3 reps - 20-30 seconds hold - Seated Knee Flexion Extension AROM   - 2-4 x daily - 7 x weekly - 2-3 sets - 10 reps - 5 seconds hold - Gastroc Stretch on Step  - 2-4 x daily - 7 x weekly - 1 sets - 3 reps - 20-30 seconds hold - Seated Straight Leg Raise  - 4-5 x daily - 7 x weekly - 1 sets - 5 reps - 3 sec hold - squat at sink with chair behind you  - 2 x daily - 7 x weekly - 2-3 sets - 10 reps - 5 seconds hold - Single Leg Press  - 1 x daily - 7 x weekly - 3 sets - 10 reps - Single Leg Knee Extension with Weight Machine  - 1 x daily - 7 x weekly - 3 sets - 10 reps - Single Leg Hamstring Curl with Weight Machine  - 1 x daily - 7 x weekly - 3 sets - 10 reps - Kettlebell Deadlift  - 1 x daily - 7 x weekly - 3 sets - 10 reps  ASSESSMENT: CLINICAL IMPRESSION:   Pt has met all goals at this time and feel he is nearing d/c.  Plan to see 1-2 more sessions to ensure community fitness plan.     OBJECTIVE IMPAIRMENTS: Abnormal gait, decreased activity tolerance, decreased balance, decreased  endurance, decreased knowledge of condition, decreased knowledge of use of DME, decreased mobility, difficulty walking, decreased ROM, decreased strength, increased edema, increased muscle spasms,  postural dysfunction, and pain.   ACTIVITY LIMITATIONS: carrying, lifting, bending, sitting, standing, squatting, sleeping, stairs, transfers, and locomotion level  PARTICIPATION LIMITATIONS: meal prep, cleaning, laundry, driving, community activity, and yard work  PERSONAL FACTORS: Fitness and 3+ comorbidities: see PMH are also affecting patient's functional outcome.   REHAB POTENTIAL: Good  CLINICAL DECISION MAKING: Stable/uncomplicated  EVALUATION COMPLEXITY: Low   GOALS: Goals reviewed with patient? Yes  SHORT TERM GOALS: (target date for Short term goals 07/15/2022)   1.  Patient will demonstrate independent use of home exercise program to maintain progress from in clinic treatments. Goal status: Met 07/01/22  2. Right knee PROM -5* ext to 95* flexion Goal Status: Partially Met 07/06/22  LONG TERM GOALS: (target dates for all long term goals are 10 weeks  08/26/2022 )   1. Patient will demonstrate/report pain at worst less than or equal to 2/10 to facilitate minimal limitation in daily activity secondary to pain symptoms.  Goal status: MET 08/18/22   2. Patient will demonstrate independent use of home exercise program to facilitate ability to maintain/progress functional gains from skilled physical therapy services.  Goal status: ongoing; still needs cues for gym 08/18/22   3. Patient will demonstrate FOTO outcome > or = 55 % to indicate reduced disability due to condition.  Goal status: MET 07/18/22   4.  Patient will demonstrate right knee LE MMT 5/5 throughout to faciltiate usual transfers, stairs, squatting at [facility] for daily life.   Goal status: MET 08/18/22   5.  Patient right knee AROM 0* ext to 100* flexion.  Goal status: MET 08/18/22   6.  pt ambulates >500', negotiates  ramps & curbs without device and stairs single rail modified independent.   Goal status: MET 08/18/22   7.  pt demonstrates activities to enable to return to yard work as pt goal.   Goal Status: MET 08/18/22   PLAN:  PT FREQUENCY: 2-3x/week  PT DURATION: 10 weeks  PLANNED INTERVENTIONS: Therapeutic exercises, Therapeutic activity, Neuromuscular re-education, Balance training, Gait training, Patient/Family education, Self Care, Joint mobilization, Stair training, Vestibular training, DME instructions, Electrical stimulation, Cryotherapy, Moist heat, scar mobilization, Taping, Vasopneumatic device, Ultrasound, Manual therapy, and Re-evaluation  PLAN FOR NEXT SESSION: gym program, plan for d/c  NEXT MD VISIT: 08/29/2022   Faustino Congress, PT, DPT 08/18/2022, 10:57 AM

## 2022-08-21 DIAGNOSIS — Z1211 Encounter for screening for malignant neoplasm of colon: Secondary | ICD-10-CM | POA: Diagnosis not present

## 2022-08-22 DIAGNOSIS — I493 Ventricular premature depolarization: Secondary | ICD-10-CM | POA: Diagnosis not present

## 2022-08-23 ENCOUNTER — Ambulatory Visit: Payer: PPO | Admitting: Physical Therapy

## 2022-08-23 ENCOUNTER — Encounter: Payer: Self-pay | Admitting: Physical Therapy

## 2022-08-23 DIAGNOSIS — M25661 Stiffness of right knee, not elsewhere classified: Secondary | ICD-10-CM | POA: Diagnosis not present

## 2022-08-23 DIAGNOSIS — R2689 Other abnormalities of gait and mobility: Secondary | ICD-10-CM

## 2022-08-23 DIAGNOSIS — R600 Localized edema: Secondary | ICD-10-CM | POA: Diagnosis not present

## 2022-08-23 DIAGNOSIS — R2681 Unsteadiness on feet: Secondary | ICD-10-CM | POA: Diagnosis not present

## 2022-08-23 DIAGNOSIS — M25561 Pain in right knee: Secondary | ICD-10-CM | POA: Diagnosis not present

## 2022-08-23 DIAGNOSIS — M6281 Muscle weakness (generalized): Secondary | ICD-10-CM

## 2022-08-23 NOTE — Therapy (Signed)
OUTPATIENT PHYSICAL THERAPY TREATMENT NOTE DISCHARGE SUMMARY    Patient Name: Bryan Hart MRN: [id number] DOB:[birth year], 72 y.o., male Today's Date: 08/23/2022    END OF SESSION:   PT End of Session - 08/23/22 1004     Visit Number 20    Number of Visits --    Date for PT Re-Evaluation --    Authorization Type [employer]    Authorization Time Period $25 co-pay    Progress Note Due on Visit 54    PT Start Time 1005    PT Stop Time 1035    PT Time Calculation (min) 30 min    Activity Tolerance Patient tolerated treatment well;No increased pain    Behavior During Therapy [facility] for tasks assessed/performed                        Past Medical History:  Diagnosis Date   Arthritis    HLD (hyperlipidemia)    Hypertension    dr  ed green      stress test   12 yrs ago   PVC's (premature ventricular contractions) 12 years ago   stress test done   Sleep apnea    Stroke [facility])    minor stroke 01/2018   Past Surgical History:  Procedure Laterality Date   ANKLE ARTHROSCOPY  06/27/2008   JOINT REPLACEMENT  06/27/2009   rt shoulder rotator cuff repair   knee surgeries  [id number]   x2   SHOULDER HEMI-ARTHROPLASTY  01/12/2012   Procedure: SHOULDER HEMI-ARTHROPLASTY;  Surgeon: Nita Sells, MD;  Location: [place];  Service: Orthopedics;  Laterality: Right;   SHOULDER SURGERY Right 07/2021   TOTAL KNEE ARTHROPLASTY Right 05/31/2022   Procedure: RIGHT TOTAL KNEE ARTHROPLASTY;  Surgeon: Mcarthur Rossetti, MD;  Location: [place];  Service: Orthopedics;  Laterality: Right;   Patient Active Problem List   Diagnosis Date Noted   Asymptomatic PVCs 07/26/2022   Hyperlipidemia 07/26/2022   Family history of heart disease 07/26/2022   Essential hypertension 07/26/2022   Status post total right knee replacement 05/31/2022   Unilateral primary osteoarthritis, left knee 03/28/2022   Unilateral primary osteoarthritis, right knee 03/28/2022   Primary osteoarthritis  of left knee 04/30/2020   Effusion, right knee 03/31/2020   Degenerative arthritis of right knee 03/31/2020   Bilateral primary osteoarthritis of knee 12/27/2018   AV block 07/29/2018   Central sleep apnea 07/29/2018   Cerebrovascular accident ([place]) 07/29/2018   Primary localized osteoarthrosis, shoulder region 01/13/2012     THERAPY DIAG:  Acute pain of right knee  Stiffness of right knee, not elsewhere classified  Muscle weakness (generalized)  Unsteadiness on feet  Other abnormalities of gait and mobility  Localized edema   PCP: Sueanne Margarita, DO  REFERRING PROVIDER: Jean Rosenthal, MD  REFERRING DIAG:  M17.11 (ICD-10-CM) - Unilateral primary osteoarthritis, right knee  Z96.651 (ICD-10-CM) - Status post total right knee replacement    EVAL THERAPY DIAG:  Acute pain of right knee  Muscle weakness (generalized)  Stiffness of right knee, not elsewhere classified  Localized edema  Other abnormalities of gait and mobility  Unsteadiness on feet  Rationale for Evaluation and Treatment: Rehabilitation  ONSET DATE:  05/31/2022 Right TKA  SUBJECTIVE:   SUBJECTIVE STATEMENT: Went to the gym over the weekend; did some of the exercises.  Feels ready to wrap up today  PERTINENT HISTORY: OA, Right shoulder hemiarthroplasty, AV block, CVA 01/2018, HTN  PAIN:  Are you having pain? Yes:  NPRS scale: today right knee 0/10 & in last week 0/10 - 2/10  and left knee today 3/10 & in last week ranging 0/10 - 5/10 Pain location: right knee anterior and lateral Pain description: sore  Aggravating factors: twisting / turning Relieving factors: sit down, massage, meds, ice  PRECAUTIONS: None  WEIGHT BEARING RESTRICTIONS: No  FALLS:  Has patient fallen in last 6 months? No    LIVING ENVIRONMENT: Lives with: lives with their spouse Lives in: [place] 2-story half bath downstairs, bedrooms & bathrooms are upstairs. Stairs: Yes: Internal: 13 steps; on right going up and  External: 1 steps; none Has following equipment at home: Single point cane, Walker - 2 wheeled, shower chair, and Grab bars  OCCUPATION: retired   PLOF: Independent, [place] with household mobility without device, and Independent with community mobility without device  PATIENT GOALS:   function without pain, improve walking, yard work  OBJECTIVE: (all measures taken from initial evaluation unless otherwise dated)  DIAGNOSTIC FINDINGS: 05/31/2022 post-op X-ray Evidence of patient's recent right total knee replacement with prosthetic components intact and normally located. Mild air and fluid within the knee joint. Skin staples over the anterior soft tissues vertically just left of midline.  PATIENT SURVEYS:  EVAL: FOTO intake: 47%   predicted:  55% 07/18/22: FOTO 64 08/18/22: FOTO 70  EDEMA:  EVAL: RLE: above knee 49.8cm  around knee 48.9cm below knee  42.8cm LLE:  above knee 41.8cm  around knee 44cm  below knee  35cm  POSTURE: rounded shoulders, forward head, flexed trunk , and weight shift left  PALPATION: Tenderness along incision, patella borders, joint line, distal quads and proximal gastroc.   LOWER EXTREMITY ROM:   ROM Right eval Right 06/22/22 Right 06/29/22 Right 07/01/22 Right 07/06/22 Right 07/11/22 Right 07/18/22 Right 07/28/22 Right 08/02/22 Right 08/09/22 Right 08/18/22  Knee flexion Seated A: 83* P: 86* Supine A: 105 P: 112 Seated P: 115* A: 107* Seated A: 110 P: 118 Supine:  A: 113 Supine A:115*     Supine A: 115  Knee extension Supine: P: -11* A: quad set -14* Seated A: LAQ -34* A: Seated LAQ  -14 P: Supine heel prop -8 Seated  A: LAQ -12* Standing A: -8* Seated A: -12 P: -10 Seated A: -12  Supine A: -10* Seated LAQ: -7 Seated LAQ -6* Seated LAQ -5* Standing A: -2* Standing A: -1* Seated LAQ: 0   (Blank rows = not tested)  LOWER EXTREMITY MMT:  MMT Right eval Left 07/14/2022 Right 07/14/2022 Right 08/18/22  Knee flexion 3-/5 HH  Dynameter 24.1#, 23.7# HH Dynameter 23.2#, 21.8# RLE:LLE 94% 5/5  Knee extension 3-/5 HH Dynameter 36.5#, 37.6# HH Dynameter 31.8#, 37.9# RLE:LLE  94% 45.2, 43.0 44.1#   (Blank rows = not tested)  FUNCTIONAL TESTS:  18 inch chair transfer: requires use of UEs on armrests and RW to stabilize  GAIT: 1:15/2024: pt amb without assistive device >200' safely.   06/14/2022: Distance walked: 100' Assistive device utilized: Environmental consultant - 2 wheeled Level of assistance: SBA Comments: antalgic pattern with decreased RLE stance, right knee flexed in stance, significantly limited in knee flexion for swing - walks with stiff LE,    TODAY'S TREATMENT [facility] Adult PT Treatment: DATE:  08/23/22 Therapeutic Exercise: Recumbent bike x 8 min level 4; seat 8 Knee extension 5# 3x10; single limb performed bil Single leg knee flexion 15# 3x10; performed bil Stairs working on eccentric knee control 6 stairs x 4 laps  08/18/22 Therapeutic Exercise: Recumbent bike x  8 min level 4; seat 8 Heel taps off 4" step 2x10; bil Leg Press bil 125# 3x10; RLE only 68# 2x10 AROM and dynamometer measurements - see above Single leg knee extension 5# 3x10; performed bil   08/16/22 Therapeutic Exercise: Recumbent bike x 8 min level 4; seat 8 Leg Press bil 125# 3x10; RLE only 68# 2x10 Single leg knee extension 5# 3x10; performed bil Single leg knee flexion 15# 3x10; performed bil Kettle bell dead lift 12# 3x10  08-23-22: Therapeutic Exercise: Recumbent bike x 8 min level 4; seat 8 Leg press BLEs 125# 15 reps with 5 sec hold ext;  Marching for SLS knee ext stance 100# 10 reps march 2 sets;  RLE only 62# 15 reps. LLE only 50# 15 reps;  Gastroc stretch on incline board straddle step 30 sec hold 2 reps ea LE Heel raises on incline board for greater range 20 reps light BUE support.  Hamstring stretch SLR strap 30 sec 2 reps ea LE.  Squat over chair with BUE on counter 10 reps.   Neuromuscular Re-education:  Standing  on foam with ipsilateral UE support SLS reaching contralateral LE 5 directions: across anteriorly, anteriorly, laterally, posteriorly & across posteriorly 3 reps ea LE.       PATIENT EDUCATION:  Education details: HEP review, POC, rationale for interventions Person educated: Patient Education method: Explanation, Demonstration, Verbal cues, and Handouts Education comprehension: verbalized understanding, returned demonstration, and verbal cues required  HOME EXERCISE PROGRAM: Access Code: [email or web address] URL: [email or web address] Date: 08/16/2022 Prepared by: Faustino Congress  Exercises - Ankle Alphabet in Elevation  - 2-4 x daily - 7 x weekly - 1 sets - 1 reps - Quad Setting and Stretching  - 2-4 x daily - 7 x weekly - 5-10 sets - 10 reps - prop 5-10 minutes & quad set5 seconds hold - Supine Heel Slide with Strap  - 2-3 x daily - 7 x weekly - 2-3 sets - 10 reps - 5 seconds hold - Hooklying Hamstring Stretch with Strap  - 2-4 x daily - 7 x weekly - 1 sets - 3 reps - 20-30 seconds hold - Seated Knee Flexion Extension AROM   - 2-4 x daily - 7 x weekly - 2-3 sets - 10 reps - 5 seconds hold - Gastroc Stretch on Step  - 2-4 x daily - 7 x weekly - 1 sets - 3 reps - 20-30 seconds hold - Seated Straight Leg Raise  - 4-5 x daily - 7 x weekly - 1 sets - 5 reps - 3 sec hold - squat at sink with chair behind you  - 2 x daily - 7 x weekly - 2-3 sets - 10 reps - 5 seconds hold - Single Leg Press  - 1 x daily - 7 x weekly - 3 sets - 10 reps - Single Leg Knee Extension with Weight Machine  - 1 x daily - 7 x weekly - 3 sets - 10 reps - Single Leg Hamstring Curl with Weight Machine  - 1 x daily - 7 x weekly - 3 sets - 10 reps - Kettlebell Deadlift  - 1 x daily - 7 x weekly - 3 sets - 10 reps  ASSESSMENT: CLINICAL IMPRESSION:   Pt has met all goals and is ready for d/c from PT.    OBJECTIVE IMPAIRMENTS: Abnormal gait, decreased activity tolerance, decreased balance, decreased endurance,  decreased knowledge of condition, decreased knowledge of use of DME, decreased mobility, difficulty walking, decreased ROM, decreased strength, increased  edema, increased muscle spasms, postural dysfunction, and pain.   ACTIVITY LIMITATIONS: carrying, lifting, bending, sitting, standing, squatting, sleeping, stairs, transfers, and locomotion level  PARTICIPATION LIMITATIONS: meal prep, cleaning, laundry, driving, community activity, and yard work  PERSONAL FACTORS: Fitness and 3+ comorbidities: see PMH are also affecting patient's functional outcome.   REHAB POTENTIAL: Good  CLINICAL DECISION MAKING: Stable/uncomplicated  EVALUATION COMPLEXITY: Low   GOALS: Goals reviewed with patient? Yes  SHORT TERM GOALS: (target date for Short term goals 07/15/2022)   1.  Patient will demonstrate independent use of home exercise program to maintain progress from in clinic treatments. Goal status: Met 07/01/22  2. Right knee PROM -5* ext to 95* flexion Goal Status: Partially Met 07/06/22  LONG TERM GOALS: (target dates for all long term goals are 10 weeks  08/26/2022 )   1. Patient will demonstrate/report pain at worst less than or equal to 2/10 to facilitate minimal limitation in daily activity secondary to pain symptoms.  Goal status: MET 08/18/22   2. Patient will demonstrate independent use of home exercise program to facilitate ability to maintain/progress functional gains from skilled physical therapy services.  Goal status: MET 08/23/22   3. Patient will demonstrate FOTO outcome > or = 55 % to indicate reduced disability due to condition.  Goal status: MET 07/18/22   4.  Patient will demonstrate right knee LE MMT 5/5 throughout to faciltiate usual transfers, stairs, squatting at [facility] for daily life.   Goal status: MET 08/18/22   5.  Patient right knee AROM 0* ext to 100* flexion.  Goal status: MET 08/18/22   6.  pt ambulates >500', negotiates ramps & curbs without device and stairs  single rail modified independent.   Goal status: MET 08/18/22   7.  pt demonstrates activities to enable to return to yard work as pt goal.   Goal Status: MET 08/18/22   PLAN:  PT FREQUENCY: 2-3x/week  PT DURATION: 10 weeks  PLANNED INTERVENTIONS: Therapeutic exercises, Therapeutic activity, Neuromuscular re-education, Balance training, Gait training, Patient/Family education, Self Care, Joint mobilization, Stair training, Vestibular training, DME instructions, Electrical stimulation, Cryotherapy, Moist heat, scar mobilization, Taping, Vasopneumatic device, Ultrasound, Manual therapy, and Re-evaluation  PLAN FOR NEXT SESSION: d/c PT today gym program  NEXT MD VISIT: 08/29/2022   Faustino Congress, PT, DPT 08/23/2022, 10:45 AM     PHYSICAL THERAPY DISCHARGE SUMMARY  Visits from Start of Care: 20  Current functional level related to goals / functional outcomes: See above   Remaining deficits: See above   Education / Equipment: HEP   Patient agrees to discharge. Patient goals were met. Patient is being discharged due to meeting the stated rehab goals.   Laureen Abrahams, PT, DPT 08/23/22 10:45 AM  [facility] [address] [place], [place], [address] Phone: [phone]   Fax:  [phone]

## 2022-08-25 ENCOUNTER — Encounter: Payer: PPO | Admitting: Physical Therapy

## 2022-08-26 ENCOUNTER — Telehealth: Payer: Self-pay | Admitting: Cardiovascular Disease

## 2022-08-26 NOTE — Telephone Encounter (Signed)
Patient is returning phone call.  °

## 2022-08-26 NOTE — Telephone Encounter (Signed)
Spoke with pt regarding his zio monitor results. All questions answered and pt verbalizes understanding.

## 2022-08-29 ENCOUNTER — Ambulatory Visit (INDEPENDENT_AMBULATORY_CARE_PROVIDER_SITE_OTHER): Payer: PPO | Admitting: Orthopaedic Surgery

## 2022-08-29 ENCOUNTER — Encounter: Payer: Self-pay | Admitting: Orthopaedic Surgery

## 2022-08-29 DIAGNOSIS — M1712 Unilateral primary osteoarthritis, left knee: Secondary | ICD-10-CM

## 2022-08-29 DIAGNOSIS — Z96651 Presence of right artificial knee joint: Secondary | ICD-10-CM

## 2022-08-29 NOTE — Progress Notes (Signed)
The patient is exactly 3 months status post a right total knee arthroplasty.  He is 72 years old and very active.  He reports good range of motion and strength and states that he is doing well.  There is still moderate swelling of his right knee to be expected.  His knee is nice and straight and his extension is full and his flexion is full.  The knee feels ligamentously stable.  His left knee does have varus malalignment and significant arthritis.  He will continue to increase his activities as comfort allows.  Will see him back in 3 months.  At that visit we will have a standing AP and lateral of both knees.  He wants to discuss at that point the future of his left knee.  If there are issues before then he knows to let us know.

## 2022-08-30 ENCOUNTER — Encounter: Payer: PPO | Admitting: Physical Therapy

## 2022-08-31 ENCOUNTER — Telehealth: Payer: Self-pay | Admitting: *Deleted

## 2022-08-31 NOTE — Telephone Encounter (Signed)
Ortho bundle 90 day call completed. Discharged from case management at this time.

## 2022-09-01 ENCOUNTER — Encounter: Payer: Self-pay | Admitting: Radiology

## 2022-09-01 ENCOUNTER — Encounter: Payer: PPO | Admitting: Physical Therapy

## 2022-10-25 ENCOUNTER — Telehealth: Payer: Self-pay | Admitting: Diagnostic Neuroimaging

## 2022-10-25 NOTE — Telephone Encounter (Signed)
Pt wife called. Stated she needs to talk to Dr. Marjory Lies because pt memory issue is getting worse. Stated she wants to talk to someone before pt appointment.

## 2022-10-25 NOTE — Telephone Encounter (Signed)
Contacted pt spouse back, she stated he became confused and delusional post knee surgery in December. It took him a while for him to adjust and get back to normal after he was home. He is currently not as independent as he use to be, she is having to do things for him, show him things, his personality has changed and his cognation has declined. Some days are bette than others. She described scenarios with family where he is guarded and doesn't want them to know what is going on with him. I informed her we will give her independent/activity scales foe her to complete based off of her observation, we will repeat memory test and MD will decide if further work up is needed like MRI or labs. She verbally understood and was appreciative.

## 2022-11-01 ENCOUNTER — Ambulatory Visit: Payer: PPO | Admitting: Diagnostic Neuroimaging

## 2022-11-01 ENCOUNTER — Encounter: Payer: Self-pay | Admitting: Diagnostic Neuroimaging

## 2022-11-01 VITALS — BP 166/98 | HR 67 | Ht 72.0 in | Wt 176.8 lb

## 2022-11-01 DIAGNOSIS — G3184 Mild cognitive impairment, so stated: Secondary | ICD-10-CM

## 2022-11-01 NOTE — Progress Notes (Signed)
[facility]  PATIENT: Bryan Hart DOB: 08/15/1950  REFERRING CLINICIAN: [name], DO  HISTORY FROM: patient  REASON FOR VISIT: follow up    HISTORICAL  CHIEF COMPLAINT:  Chief Complaint  Patient presents with   Follow-up    Patient in room #6 with his wife. Patient states he here today to f/u with his memory issues.    HISTORY OF PRESENT ILLNESS:   UPDATE (11/01/22, VRP): Since last visit, doing about the same. Symptoms are stable to slightly progressed. Severity is mild. No alleviating or aggravating factors. Had .    UPDATE (08/17/21, VRP): Since last visit, doing well. Mild right hand tremor (mother had tremor; 2 other brothers have tremor now). Mild short term memory loss, diff with naming, since last 2 years. No major changes ADLs. Overall doing well.   UPDATE (03/19/19, VRP): Since last visit, doing well. Symptoms are stable. Severity is mild. No alleviating or aggravating factors. Tolerating meds.  PRIOR HPI (03/12/18): 72 year old left-handed male here for evaluation of stroke.  History of hypertension and sleep apnea.  Approximately 3 weeks of patient noticed mild numbness in his right hand, digits 3 and 4, with some clumsiness of the right hand.  He had recently started a muscle relaxer and thought this may be a side effect.  Few days later patient went into PCP for evaluation, who recognized possible stroke symptoms and ordered MRI of the brain.  MRI of the brain which confirmed acute to subacute ischemic infarction of the left precentral gyrus, correlating to patient's symptoms.  Echocardiogram and carotid ultrasound were obtained which were unremarkable.  Patient was started on aspirin 81 mg/day and referred here for further follow-up.  Patient denies any numbness or weakness in the right face and right leg.  No headaches.  No recent injuries or traumas.  Symptoms are gradually improving.  Patient was diagnosed with mild to moderate sleep apnea  6-7 years ago, but could not tolerate CPAP mask.  He did not have any other further follow-up.   REVIEW OF SYSTEMS: Full 14 system review of systems performed and negative with exception of: as per HPI.   ALLERGIES: Allergies  Allergen Reactions   Adhesive [Tape] Rash    HOME MEDICATIONS: Outpatient Medications Prior to Visit  Medication Sig Dispense Refill   aspirin EC 81 MG tablet Take 81 mg by mouth daily. Swallow whole.     atorvastatin (LIPITOR) 20 MG tablet Take 20 mg by mouth daily at 6 PM.     methocarbamol (ROBAXIN) 500 MG tablet Take 1 tablet (500 mg total) by mouth every 6 (six) hours as needed for muscle spasms. 40 tablet 1   verapamil (VERELAN PM) 180 MG 24 hr capsule Take 180 mg by mouth daily.     No facility-administered medications prior to visit.    PAST MEDICAL HISTORY: Past Medical History:  Diagnosis Date   Arthritis    HLD (hyperlipidemia)    Hypertension    dr  ed green      stress test   12 yrs ago   PVC's (premature ventricular contractions) 12 years ago   stress test done   Sleep apnea    Stroke [facility])    minor stroke 01/2018    PAST SURGICAL HISTORY: Past Surgical History:  Procedure Laterality Date   ANKLE ARTHROSCOPY  06/27/2008   JOINT REPLACEMENT  06/27/2009   rt shoulder rotator cuff repair   knee surgeries  0454,0981   x2   SHOULDER HEMI-ARTHROPLASTY  01/12/2012   Procedure: SHOULDER HEMI-ARTHROPLASTY;  Surgeon: Mable Paris, MD;  Location: [facility] OR;  Service: Orthopedics;  Laterality: Right;   SHOULDER SURGERY Right 07/2021   TOTAL KNEE ARTHROPLASTY Right 05/31/2022   Procedure: RIGHT TOTAL KNEE ARTHROPLASTY;  Surgeon: Kathryne Hitch, MD;  Location: [facility] OR;  Service: Orthopedics;  Laterality: Right;    FAMILY HISTORY: Family History  Problem Relation Age of Onset   Heart disease Father    Hypertension Father    Heart attack Brother    Hypertension Brother    Stroke Paternal Grandmother    Heart attack Maternal  Uncle    Heart disease Brother    Colon cancer Neg Hx     SOCIAL HISTORY: Social History   Socioeconomic History   Marital status: Married    Spouse name: Elease Hashimoto   Number of children: Not on file   Years of education: Not on file   Highest education level: Master's degree (e.g., MA, MS, MEng, MEd, MSW, MBA)  Occupational History   Not on file  Tobacco Use   Smoking status: Former    Packs/day: 0.50    Years: 4.00    Additional pack years: 0.00    Total pack years: 2.00    Types: Cigarettes    Quit date: 1975    Years since quitting: 49.3   Smokeless tobacco: Never  Vaping Use   Vaping Use: Never used  Substance and Sexual Activity   Alcohol use: Yes    Alcohol/week: 1.0 - 2.0 standard drink of alcohol    Types: 1 - 2 Cans of beer per week    Comment: daily   Drug use: No   Sexual activity: Not on file  Other Topics Concern   Not on file  Social History Narrative   Lives home with spouse Elease Hashimoto.   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   Left handed    Social Determinants of Health   Financial Resource Strain: Not on file  Food Insecurity: Not on file  Transportation Needs: Not on file  Physical Activity: Not on file  Stress: Not on file  Social Connections: Not on file  Intimate Partner Violence: Not on file     PHYSICAL EXAM  GENERAL EXAM/CONSTITUTIONAL: Vitals:  Vitals:   11/01/22 1436  BP: (!) 166/98  Pulse: 67  Weight: 176 lb 12.8 oz (80.2 kg)  Height: 6' (1.829 m)   Body mass index is 23.98 kg/m. Wt Readings from Last 3 Encounters:  11/01/22 176 lb 12.8 oz (80.2 kg)  08/17/22 178 lb 12.8 oz (81.1 kg)  07/26/22 176 lb 12.8 oz (80.2 kg)   Patient is in no distress; well developed, nourished and groomed; neck is supple  CARDIOVASCULAR: Examination of carotid arteries is normal; no carotid bruits Regular rate and rhythm, no murmurs Examination of peripheral vascular system by observation and palpation is normal  EYES: Ophthalmoscopic  exam of optic discs and posterior segments is normal; no papilledema or hemorrhages No results found.  MUSCULOSKELETAL: Gait, strength, tone, movements noted in Neurologic exam below  NEUROLOGIC: MENTAL STATUS:     11/01/2022    2:42 PM 08/17/2021    8:55 AM  MMSE - Mini Mental State Exam  Orientation to time 5 5  Orientation to Place 5 5  Registration 3 3  Attention/ Calculation 2 4  Recall 3 2  Language- name 2 objects 2 2  Language- repeat 1 1  Language- follow 3 step command 3 3  Language- read &  follow direction 1 1  Write a sentence 1 1  Copy design 1 1  Total score 27 28   awake, alert, oriented to person, place and time recent and remote memory intact normal attention and concentration language fluent, comprehension intact, naming intact fund of knowledge appropriate  CRANIAL NERVE:  2nd - no papilledema on fundoscopic exam 2nd, 3rd, 4th, 6th - pupils equal and reactive to light, visual fields full to confrontation, extraocular muscles intact, no nystagmus 5th - facial sensation symmetric 7th - facial strength symmetric 8th - hearing intact 9th - palate elevates symmetrically, uvula midline 11th - shoulder shrug symmetric 12th - tongue protrusion midline  MOTOR:  MILD POSTURAL TREMOR IN RUE; MILD TREMOR IN HEAD AND LEFT HAND normal bulk and tone, full strength in the BUE, BLE  SENSORY:  normal and symmetric to light touch, temperature, vibration  COORDINATION:  finger-nose-finger, fine finger movements normal  REFLEXES:  deep tendon reflexes TRACE and symmetric  GAIT/STATION:  narrow based gait     DIAGNOSTIC DATA (LABS, IMAGING, TESTING) - I reviewed patient records, labs, notes, testing and imaging myself where available.  Lab Results  Component Value Date   WBC 15.6 (H) 06/01/2022   HGB 11.5 (L) 06/01/2022   HCT 33.2 (L) 06/01/2022   MCV 92.7 06/01/2022   PLT 266 06/01/2022      Component Value Date/Time   NA 136 06/01/2022 0559   K  3.8 06/01/2022 0559   CL 107 06/01/2022 0559   CO2 22 06/01/2022 0559   GLUCOSE 130 (H) 06/01/2022 0559   BUN 17 06/01/2022 0559   CREATININE 0.98 06/01/2022 0559   CALCIUM 8.6 (L) 06/01/2022 0559   PROT 6.6 05/24/2022 0931   ALBUMIN 3.5 05/24/2022 0931   AST 22 05/24/2022 0931   ALT 18 05/24/2022 0931   ALKPHOS 68 05/24/2022 0931   BILITOT 0.7 05/24/2022 0931   GFRNONAA >60 06/01/2022 0559   GFRAA >60 02/24/2018 1230   No results found for: "CHOL", "HDL", "LDLCALC", "LDLDIRECT", "TRIG", "CHOLHDL" No results found for: "HGBA1C" No results found for: "VITAMINB12" No results found for: "TSH"   03/05/18 TTE  - Normal LV size and systolic function, EF 55-60%. Normal RV size   and systolic function. Borderline pulmonary hypertension.  03/05/18 carotid u/s Right Carotid: Velocities in the right ICA are consistent with a 1-39% stenosis. Left Carotid: Velocities in the left ICA are consistent with a 1-39% stenosis. Vertebrals:  Bilateral vertebral arteries demonstrate antegrade flow. Subclavians: Normal flow hemodynamics were seen in bilateral subclavian arteries.  02/24/18 MRI brain (with and without) [I reviewed images myself and agree with interpretation. -VRP]  1. 1 cm acute infarct on the left motor strip at the hand knob. 2. 6 mm enhancing nodule medial to the right V4 segment. A schwannoma or benign enhancing foramen magnum lesion is favored. Further discussion above. Recommend follow-up brain MRI to ensure stability. 3. Small remote right frontal cortex infarct.   ASSESSMENT AND PLAN  72 y.o. year old male here with HTN, sleep apnea, here for stroke evaluation.   Ddx: left frontal stroke (artery-artery embolism vs small vessel thrombosis)  1. MCI (mild cognitive impairment) with memory loss     PLAN:  MEMORY LOSS (MMSE 27/30; no major changes in ADLs; some progression of sxs per patient and wife) - normal aging vs mild cognitive impairment - safety / supervision issues  reviewed - daily physical activity / exercise (at least 15-30 minutes) - eat more plants / vegetables - increase  social activities, brain stimulation, games, puzzles, hobbies, crafts, arts, music - aim for at least 7-8 hours sleep per night (or more) - avoid smoking and alcohol - caution with medications, finances, driving  TREMOR  - mild essential tremor; monitor; consider propranolol or primidone  STROKE PREVENTION - continue aspirin, verapamil, statin   VERTEBRAL ARTERY LESION - non-specific lesion; monitor   SLEEP APNEA - follow up with Dr. Vickey Huger  Return for return to PCP, pending if symptoms worsen or fail to improve.    Suanne Marker, MD 11/01/2022, 3:24 PM Certified in Neurology, Neurophysiology and Neuroimaging  [facility] [address], [address] [place], [place] [address] [phone]

## 2022-11-01 NOTE — Patient Instructions (Signed)
  MEMORY LOSS (MMSE 27/30; no major changes in ADLs; some progression of sxs per patient and wife) - normal aging vs mild cognitive impairment - safety / supervision issues reviewed - daily physical activity / exercise (at least 15-30 minutes) - eat more plants / vegetables - increase social activities, brain stimulation, games, puzzles, hobbies, crafts, arts, music - aim for at least 7-8 hours sleep per night (or more) - avoid smoking and alcohol - caution with medications, finances, driving  TREMOR  - mild essential tremor; monitor; consider propranolol or primidone  STROKE PREVENTION - continue aspirin, verapamil, statin

## 2022-11-30 ENCOUNTER — Ambulatory Visit (INDEPENDENT_AMBULATORY_CARE_PROVIDER_SITE_OTHER): Payer: PPO | Admitting: Orthopaedic Surgery

## 2022-11-30 ENCOUNTER — Other Ambulatory Visit (INDEPENDENT_AMBULATORY_CARE_PROVIDER_SITE_OTHER): Payer: PPO

## 2022-11-30 ENCOUNTER — Encounter: Payer: Self-pay | Admitting: Orthopaedic Surgery

## 2022-11-30 DIAGNOSIS — Z96651 Presence of right artificial knee joint: Secondary | ICD-10-CM

## 2022-11-30 NOTE — Progress Notes (Signed)
The patient is a 72 year old gentleman who is now 6 months status post a right total knee arthroplasty.  He has known severe arthritis in his left knee.  He is not ready for right knee replacement as of yet because it really only bothers him going up and down stairs.  He says his right total knee is doing well and he has good range of motion and strength.  His right knee is nice and straight.  There is no significant swelling with that knee.  It feels ligamentously stable with good range of motion.  His left knee has varus malalignment that is not correctable.  There is patellofemoral rotation but good range of motion of the left knee.  An AP and lateral of the right knee shows a well-seated total knee arthroplasty with no complicating features.  The left knee shows severe tricompartment arthritis that is end-stage with bone-on-bone wear of all 3 compartments and large osteophytes in all 3 compartments.  From a right knee standpoint, we will see him back in 6 months at the 1 year standpoint for repeat AP and lateral of the right knee.  There is no further recommendations for the left knee and since is not bothering him enough he we will wait to proceed for a left knee replacement when he gets to where it is causing him daily issues with that knee.

## 2022-12-19 ENCOUNTER — Ambulatory Visit: Payer: PPO | Admitting: Diagnostic Neuroimaging

## 2022-12-27 DIAGNOSIS — H5203 Hypermetropia, bilateral: Secondary | ICD-10-CM | POA: Diagnosis not present

## 2022-12-27 DIAGNOSIS — H524 Presbyopia: Secondary | ICD-10-CM | POA: Diagnosis not present

## 2022-12-27 DIAGNOSIS — H2513 Age-related nuclear cataract, bilateral: Secondary | ICD-10-CM | POA: Diagnosis not present

## 2022-12-27 DIAGNOSIS — H52203 Unspecified astigmatism, bilateral: Secondary | ICD-10-CM | POA: Diagnosis not present

## 2022-12-27 DIAGNOSIS — H04123 Dry eye syndrome of bilateral lacrimal glands: Secondary | ICD-10-CM | POA: Diagnosis not present

## 2022-12-27 DIAGNOSIS — H25013 Cortical age-related cataract, bilateral: Secondary | ICD-10-CM | POA: Diagnosis not present

## 2023-01-03 ENCOUNTER — Encounter: Payer: Self-pay | Admitting: Podiatry

## 2023-01-03 ENCOUNTER — Ambulatory Visit: Payer: PPO | Admitting: Podiatry

## 2023-01-03 DIAGNOSIS — M79675 Pain in left toe(s): Secondary | ICD-10-CM

## 2023-01-03 DIAGNOSIS — M79674 Pain in right toe(s): Secondary | ICD-10-CM

## 2023-01-03 DIAGNOSIS — B351 Tinea unguium: Secondary | ICD-10-CM

## 2023-01-03 NOTE — Progress Notes (Signed)
Subjective:   Patient ID: Bryan Hart, male   DOB: 72 y.o.   MRN: [id number]   HPI Chief Complaint  Patient presents with   Debridement    Requesting toenail trim, unable to cut well himself   New Patient (Initial Visit)    71 year old male presents the office for above concerns.  He states that he has difficulty trimming his nails and not sure how to cut them.  No swelling redness or drainage.  Do cause discomfort at the get long.  Review of Systems  All other systems reviewed and are negative.   Past Medical History:  Diagnosis Date   Arthritis    HLD (hyperlipidemia)    Hypertension    dr  ed green      stress test   12 yrs ago   PVC's (premature ventricular contractions) 12 years ago   stress test done   Sleep apnea    Stroke [facility])    minor stroke 01/2018    Past Surgical History:  Procedure Laterality Date   ANKLE ARTHROSCOPY  06/27/2008   JOINT REPLACEMENT  06/27/2009   rt shoulder rotator cuff repair   knee surgeries  [id number]   x2   SHOULDER HEMI-ARTHROPLASTY  01/12/2012   Procedure: SHOULDER HEMI-ARTHROPLASTY;  Surgeon: Mable Paris, MD;  Location: [facility] OR;  Service: Orthopedics;  Laterality: Right;   SHOULDER SURGERY Right 07/2021   TOTAL KNEE ARTHROPLASTY Right 05/31/2022   Procedure: RIGHT TOTAL KNEE ARTHROPLASTY;  Surgeon: Kathryne Hitch, MD;  Location: [facility] OR;  Service: Orthopedics;  Laterality: Right;     Current Outpatient Medications:    aspirin EC 81 MG tablet, Take 81 mg by mouth daily. Swallow whole., Disp: , Rfl:    atorvastatin (LIPITOR) 20 MG tablet, Take 20 mg by mouth daily at 6 PM., Disp: , Rfl:    methocarbamol (ROBAXIN) 500 MG tablet, Take 1 tablet (500 mg total) by mouth every 6 (six) hours as needed for muscle spasms., Disp: 40 tablet, Rfl: 1   verapamil (VERELAN PM) 180 MG 24 hr capsule, Take 180 mg by mouth daily., Disp: , Rfl:   Allergies  Allergen Reactions   Adhesive [Tape] Rash          Objective:   Physical Exam  General: AAO x3, NAD  Dermatological: Nails are mildly hypertrophic, dystrophic, brittle, elongated 10. There is yellow discoloration to the toenails. No surrounding redness or drainage. Tenderness nails 1-5 bilaterally. No open lesions or pre-ulcerative lesions are identified today.  Vascular: Dorsalis Pedis artery and Posterior Tibial artery pedal pulses are 2/4 bilateral with immedate capillary fill time. There is no pain with calf compression, swelling, warmth, erythema.   Neruologic: Grossly intact via light touch bilateral. Vibratory intact via tuning fork bilateral. Protective threshold with Semmes Wienstein monofilament intact to all pedal sites bilateral. Patellar and Achilles deep tendon reflexes 2+ bilateral. No Babinski or clonus noted bilateral.   Musculoskeletal: No gross boney pedal deformities bilateral. No pain, crepitus, or limitation noted with foot and ankle range of motion bilateral. Muscular strength 5/5 in all groups tested bilateral.  Gait: Unassisted, Nonantalgic.       Assessment:   Symptomatic onychomycosis     Plan:  -Treatment options discussed including all alternatives, risks, and complications -Etiology of symptoms were discussed -Nails debrided 10 without complications or bleeding. -Daily foot inspection -Follow-up in 3 months or sooner if any problems arise. In the meantime, encouraged to call the office with any questions, concerns, change in  symptoms.   Ovid Curd, DPM

## 2023-01-05 DIAGNOSIS — H25012 Cortical age-related cataract, left eye: Secondary | ICD-10-CM | POA: Diagnosis not present

## 2023-01-05 DIAGNOSIS — H52222 Regular astigmatism, left eye: Secondary | ICD-10-CM | POA: Diagnosis not present

## 2023-01-05 DIAGNOSIS — H25812 Combined forms of age-related cataract, left eye: Secondary | ICD-10-CM | POA: Diagnosis not present

## 2023-01-05 DIAGNOSIS — H2512 Age-related nuclear cataract, left eye: Secondary | ICD-10-CM | POA: Diagnosis not present

## 2023-01-19 DIAGNOSIS — H25011 Cortical age-related cataract, right eye: Secondary | ICD-10-CM | POA: Diagnosis not present

## 2023-01-19 DIAGNOSIS — H2511 Age-related nuclear cataract, right eye: Secondary | ICD-10-CM | POA: Diagnosis not present

## 2023-01-19 DIAGNOSIS — H52221 Regular astigmatism, right eye: Secondary | ICD-10-CM | POA: Diagnosis not present

## 2023-01-19 DIAGNOSIS — H25811 Combined forms of age-related cataract, right eye: Secondary | ICD-10-CM | POA: Diagnosis not present

## 2023-02-21 DIAGNOSIS — M170 Bilateral primary osteoarthritis of knee: Secondary | ICD-10-CM | POA: Diagnosis not present

## 2023-02-21 DIAGNOSIS — I10 Essential (primary) hypertension: Secondary | ICD-10-CM | POA: Diagnosis not present

## 2023-02-21 DIAGNOSIS — R634 Abnormal weight loss: Secondary | ICD-10-CM | POA: Diagnosis not present

## 2023-02-21 DIAGNOSIS — I493 Ventricular premature depolarization: Secondary | ICD-10-CM | POA: Diagnosis not present

## 2023-02-21 DIAGNOSIS — F325 Major depressive disorder, single episode, in full remission: Secondary | ICD-10-CM | POA: Diagnosis not present

## 2023-02-21 DIAGNOSIS — R413 Other amnesia: Secondary | ICD-10-CM | POA: Diagnosis not present

## 2023-02-21 DIAGNOSIS — R5383 Other fatigue: Secondary | ICD-10-CM | POA: Diagnosis not present

## 2023-02-21 DIAGNOSIS — E785 Hyperlipidemia, unspecified: Secondary | ICD-10-CM | POA: Diagnosis not present

## 2023-02-21 DIAGNOSIS — G4733 Obstructive sleep apnea (adult) (pediatric): Secondary | ICD-10-CM | POA: Diagnosis not present

## 2023-02-21 DIAGNOSIS — R251 Tremor, unspecified: Secondary | ICD-10-CM | POA: Diagnosis not present

## 2023-02-21 DIAGNOSIS — Z01818 Encounter for other preprocedural examination: Secondary | ICD-10-CM | POA: Diagnosis not present

## 2023-03-09 DIAGNOSIS — F325 Major depressive disorder, single episode, in full remission: Secondary | ICD-10-CM | POA: Diagnosis not present

## 2023-03-09 DIAGNOSIS — R3589 Other polyuria: Secondary | ICD-10-CM | POA: Diagnosis not present

## 2023-03-09 DIAGNOSIS — M170 Bilateral primary osteoarthritis of knee: Secondary | ICD-10-CM | POA: Diagnosis not present

## 2023-03-09 DIAGNOSIS — I10 Essential (primary) hypertension: Secondary | ICD-10-CM | POA: Diagnosis not present

## 2023-03-09 DIAGNOSIS — Z8673 Personal history of transient ischemic attack (TIA), and cerebral infarction without residual deficits: Secondary | ICD-10-CM | POA: Diagnosis not present

## 2023-03-09 DIAGNOSIS — I959 Hypotension, unspecified: Secondary | ICD-10-CM | POA: Diagnosis not present

## 2023-03-09 DIAGNOSIS — R413 Other amnesia: Secondary | ICD-10-CM | POA: Diagnosis not present

## 2023-03-09 DIAGNOSIS — R251 Tremor, unspecified: Secondary | ICD-10-CM | POA: Diagnosis not present

## 2023-03-09 DIAGNOSIS — I493 Ventricular premature depolarization: Secondary | ICD-10-CM | POA: Diagnosis not present

## 2023-03-09 DIAGNOSIS — E785 Hyperlipidemia, unspecified: Secondary | ICD-10-CM | POA: Diagnosis not present

## 2023-03-20 ENCOUNTER — Telehealth: Payer: Self-pay

## 2023-03-20 ENCOUNTER — Encounter (HOSPITAL_BASED_OUTPATIENT_CLINIC_OR_DEPARTMENT_OTHER): Payer: Self-pay

## 2023-03-20 ENCOUNTER — Telehealth: Payer: Self-pay | Admitting: Orthopaedic Surgery

## 2023-03-20 NOTE — Telephone Encounter (Signed)
Pt left vm requesting an earlier appt is needed with Dr. Marjory Lies. Left no other details on why he has requested a sooner appt.

## 2023-03-20 NOTE — Telephone Encounter (Signed)
Patient called. He has a dentist appointment at 10am. Would like to know if he needs medication? Having a crown placed. His cb# is [phone]

## 2023-03-20 NOTE — Telephone Encounter (Signed)
Returned pt's call. Spoke with pt's wife, she stated that pt had recently seen his PCP and he was advised to schedule a follow up with his neurologist for his mild cognitive impairment. Message will be sent to the front desk to get pt scheduled.

## 2023-03-20 NOTE — Telephone Encounter (Signed)
Called and advised pt this is no longer needed. Note also sent to his mychart

## 2023-03-23 DIAGNOSIS — I10 Essential (primary) hypertension: Secondary | ICD-10-CM | POA: Diagnosis not present

## 2023-03-23 DIAGNOSIS — I493 Ventricular premature depolarization: Secondary | ICD-10-CM | POA: Diagnosis not present

## 2023-03-23 DIAGNOSIS — G4733 Obstructive sleep apnea (adult) (pediatric): Secondary | ICD-10-CM | POA: Diagnosis not present

## 2023-03-23 DIAGNOSIS — I959 Hypotension, unspecified: Secondary | ICD-10-CM | POA: Diagnosis not present

## 2023-03-23 DIAGNOSIS — E785 Hyperlipidemia, unspecified: Secondary | ICD-10-CM | POA: Diagnosis not present

## 2023-04-04 ENCOUNTER — Emergency Department (HOSPITAL_BASED_OUTPATIENT_CLINIC_OR_DEPARTMENT_OTHER)
Admission: EM | Admit: 2023-04-04 | Discharge: 2023-04-04 | Disposition: A | Discharge status: Home / Self Care | Payer: PPO | Attending: Emergency Medicine | Admitting: Emergency Medicine

## 2023-04-04 ENCOUNTER — Emergency Department (HOSPITAL_BASED_OUTPATIENT_CLINIC_OR_DEPARTMENT_OTHER): Payer: PPO

## 2023-04-04 ENCOUNTER — Other Ambulatory Visit: Payer: Self-pay

## 2023-04-04 ENCOUNTER — Encounter (HOSPITAL_BASED_OUTPATIENT_CLINIC_OR_DEPARTMENT_OTHER): Payer: Self-pay | Admitting: Urology

## 2023-04-04 DIAGNOSIS — R42 Dizziness and giddiness: Secondary | ICD-10-CM | POA: Diagnosis present

## 2023-04-04 DIAGNOSIS — Z7901 Long term (current) use of anticoagulants: Secondary | ICD-10-CM | POA: Insufficient documentation

## 2023-04-04 DIAGNOSIS — I10 Essential (primary) hypertension: Secondary | ICD-10-CM | POA: Diagnosis not present

## 2023-04-04 DIAGNOSIS — I4891 Unspecified atrial fibrillation: Secondary | ICD-10-CM | POA: Diagnosis not present

## 2023-04-04 DIAGNOSIS — I771 Stricture of artery: Secondary | ICD-10-CM | POA: Diagnosis not present

## 2023-04-04 DIAGNOSIS — E785 Hyperlipidemia, unspecified: Secondary | ICD-10-CM | POA: Diagnosis not present

## 2023-04-04 DIAGNOSIS — Z79899 Other long term (current) drug therapy: Secondary | ICD-10-CM | POA: Insufficient documentation

## 2023-04-04 DIAGNOSIS — I493 Ventricular premature depolarization: Secondary | ICD-10-CM | POA: Diagnosis not present

## 2023-04-04 DIAGNOSIS — G4733 Obstructive sleep apnea (adult) (pediatric): Secondary | ICD-10-CM | POA: Diagnosis not present

## 2023-04-04 DIAGNOSIS — Z96611 Presence of right artificial shoulder joint: Secondary | ICD-10-CM | POA: Diagnosis not present

## 2023-04-04 DIAGNOSIS — I959 Hypotension, unspecified: Secondary | ICD-10-CM | POA: Diagnosis not present

## 2023-04-04 LAB — CBC
HCT: 41.2 % (ref 39.0–52.0)
Hemoglobin: 13.6 g/dL (ref 13.0–17.0)
MCH: 31.3 pg (ref 26.0–34.0)
MCHC: 33 g/dL (ref 30.0–36.0)
MCV: 94.9 fL (ref 80.0–100.0)
Platelets: 252 10*3/uL (ref 150–400)
RBC: 4.34 MIL/uL (ref 4.22–5.81)
RDW: 13.4 % (ref 11.5–15.5)
WBC: 5.1 10*3/uL (ref 4.0–10.5)
nRBC: 0 % (ref 0.0–0.2)

## 2023-04-04 LAB — BASIC METABOLIC PANEL
Anion gap: 7 (ref 5–15)
BUN: 23 mg/dL (ref 8–23)
CO2: 29 mmol/L (ref 22–32)
Calcium: 9.3 mg/dL (ref 8.9–10.3)
Chloride: 105 mmol/L (ref 98–111)
Creatinine, Ser: 1.27 mg/dL — ABNORMAL HIGH (ref 0.61–1.24)
GFR, Estimated: 59 mL/min — ABNORMAL LOW (ref >60)
Glucose, Bld: 99 mg/dL (ref 70–99)
Potassium: 4 mmol/L (ref 3.5–5.1)
Sodium: 141 mmol/L (ref 135–145)

## 2023-04-04 LAB — TROPONIN I (HIGH SENSITIVITY)
Troponin I (High Sensitivity): 11 ng/L (ref <18)
Troponin I (High Sensitivity): 10 ng/L (ref <18)

## 2023-04-04 LAB — TSH: TSH: 3.056 u[IU]/mL (ref 0.350–4.500)

## 2023-04-04 LAB — MAGNESIUM: Magnesium: 2 mg/dL (ref 1.7–2.4)

## 2023-04-04 MED ORDER — METOPROLOL TARTRATE 25 MG PO TABS
50.0000 mg | ORAL_TABLET | Freq: Once | ORAL | Status: AC
  Administered 2023-04-04: 50 mg via ORAL
  Filled 2023-04-04: qty 2

## 2023-04-04 MED ORDER — METOPROLOL TARTRATE 50 MG PO TABS: 50.0000 mg | ORAL_TABLET | Freq: Two times a day (BID) | ORAL | 0 refills | Status: DC

## 2023-04-04 MED ORDER — DILTIAZEM LOAD VIA INFUSION
20.0000 mg | Freq: Once | INTRAVENOUS | Status: AC
  Administered 2023-04-04: 20 mg via INTRAVENOUS
  Filled 2023-04-04: qty 20

## 2023-04-04 MED ORDER — APIXABAN 5 MG PO TABS: 5.0000 mg | ORAL_TABLET | Freq: Two times a day (BID) | ORAL | 0 refills | Status: DC

## 2023-04-04 MED ORDER — DILTIAZEM HCL-DEXTROSE 125-5 MG/125ML-% IV SOLN (PREMIX)
5.0000 mg/h | INTRAVENOUS | Status: DC
  Administered 2023-04-04: 5 mg/h via INTRAVENOUS
  Filled 2023-04-04: qty 125

## 2023-04-04 NOTE — Discharge Instructions (Addendum)
You were seen in the emergency department for your weakness and dizziness.  You were in A-fib with a rapid heart rate.  We gave you medication through the IV which slowed down your heart rate and we were able to turn off the IV medication and your heart rate remained controlled.  Your labs showed no obvious cause of your A-fib.  I have started you on a medication called metoprolol to keep your heart rate controlled as well as a blood thinner called Eliquis to prevent blood clots.  You should follow-up in the A-fib clinic.  I have sent you a referral and they should be calling you for a follow-up appointment however if you do not hear from them in the next few days you should call to schedule your follow-up appointment.  You should return to the emergency department if you feel your heart is racing despite taking her medication, you have severe chest pain or shortness of breath, you pass out or any other new or concerning symptoms.

## 2023-04-04 NOTE — ED Triage Notes (Signed)
Pt sent from PCP for Afib RVR, HR 130 at office  Denies pain or SOB  States has been feeling off since this am HR 157 now

## 2023-04-04 NOTE — ED Provider Notes (Signed)
[place] EMERGENCY DEPARTMENT AT [facility] Provider Note   CSN: [id number] Arrival date & time: 04/04/23  1657     History  Chief Complaint  Patient presents with   Atrial Fibrillation    Bryan Hart is a 72 y.o. male.  Patient is a 72 year old male with a past medical history of hypertension, PVCs, CVA presenting to the emergency department with dizziness and fatigue.  Patient reports that he has been having increased fatigue over the last few days and has been dealing with dizziness for the last several weeks.  He states that he felt so dizzy that he might pass out and slept most of the day today and so he went to his primary doctors to be evaluated.  There he was found to be in new A-fib with RVR and was recommended to come to the emergency department for evaluation.  He did report an episode of chest pain yesterday.  He states that he stopped his verapamil about 3 to 4 weeks ago due to his dizziness and blood pressures were running low but had no significant improvement of symptoms.  He denies any recent nausea, vomiting or diarrhea.  Denies any alcohol use or significant caffeine use.  The history is provided by the patient and the spouse.  Atrial Fibrillation       Home Medications Prior to Admission medications   Medication Sig Start Date End Date Taking? Authorizing Provider  apixaban (ELIQUIS) 5 MG TABS tablet Take 1 tablet (5 mg total) by mouth 2 (two) times daily. 04/04/23 05/04/23 Yes Theresia Lo, Benetta Spar K, DO  metoprolol tartrate (LOPRESSOR) 50 MG tablet Take 1 tablet (50 mg total) by mouth 2 (two) times daily. 04/04/23  Yes Elayne Snare K, DO  aspirin EC 81 MG tablet Take 81 mg by mouth daily. Swallow whole.    [provider]  atorvastatin (LIPITOR) 20 MG tablet Take 20 mg by mouth daily at 6 PM.    [provider]  methocarbamol (ROBAXIN) 500 MG tablet Take 1 tablet (500 mg total) by mouth every 6 (six) hours as needed for muscle  spasms. 06/02/22   Kirtland Bouchard, PA-C  verapamil (VERELAN PM) 180 MG 24 hr capsule Take 180 mg by mouth daily. 07/05/22   [provider]      Allergies    Adhesive [tape]    Review of Systems   Review of Systems  Physical Exam Updated Vital Signs BP (!) 141/109   Pulse 85   Temp (!) 97.3 F (36.3 C)   Resp 15   Ht 6' (1.829 m)   Wt 80.2 kg   SpO2 98%   BMI 23.98 kg/m  Physical Exam Vitals and nursing note reviewed.  Constitutional:      General: He is not in acute distress.    Appearance: Normal appearance.  HENT:     Head: Normocephalic and atraumatic.     Nose: Nose normal.     Mouth/Throat:     Mouth: Mucous membranes are moist.     Pharynx: Oropharynx is clear.  Eyes:     Extraocular Movements: Extraocular movements intact.     Conjunctiva/sclera: Conjunctivae normal.  Cardiovascular:     Rate and Rhythm: Tachycardia present. Rhythm irregular.     Pulses: Normal pulses.     Heart sounds: Normal heart sounds.  Pulmonary:     Effort: Pulmonary effort is normal.     Breath sounds: Normal breath sounds.  Abdominal:     General:  Abdomen is flat.     Palpations: Abdomen is soft.     Tenderness: There is no abdominal tenderness.  Musculoskeletal:        General: Normal range of motion.     Cervical back: Normal range of motion.     Right lower leg: No edema.     Left lower leg: No edema.  Skin:    General: Skin is warm and dry.  Neurological:     General: No focal deficit present.     Mental Status: He is alert and oriented to person, place, and time.  Psychiatric:        Mood and Affect: Mood normal.        Behavior: Behavior normal.     ED Results / Procedures / Treatments   Labs (all labs ordered are listed, but only abnormal results are displayed) Labs Reviewed  BASIC METABOLIC PANEL - Abnormal; Notable for the following components:      Result Value   Creatinine, Ser 1.27 (*)    GFR, Estimated 59 (*)    All other components within  normal limits  CBC  MAGNESIUM  TSH  TROPONIN I (HIGH SENSITIVITY)  TROPONIN I (HIGH SENSITIVITY)    EKG EKG Interpretation Date/Time:  Tuesday April 04 2023 18:43:43 EDT Ventricular Rate:  71 PR Interval:    QRS Duration:  92 QT Interval:  395 QTC Calculation: 430 R Axis:   12  Text Interpretation: Atrial fibrillation Minimal ST elevation, inferior leads rate now improved compared to prior EKG Confirmed by Elayne Snare ([id number]) on 04/04/2023 6:45:57 PM  Radiology DG Chest Port 1 View  Result Date: 04/04/2023 CLINICAL DATA:  Atrial fibrillation EXAM: PORTABLE CHEST 1 VIEW COMPARISON:  01/05/2012 FINDINGS: Artifact from EKG leads. Normal heart size.  Stable aortic tortuosity. There is no edema, consolidation, effusion, or pneumothorax. Artifact from EKG leads.  Right shoulder replacement. IMPRESSION: No evidence of active disease. Electronically Signed   By: Tiburcio Pea M.D.   On: 04/04/2023 18:43    Procedures .Critical Care  Performed by: Rexford Maus, DO Authorized by: Rexford Maus, DO   Critical care provider statement:    Critical care time (minutes):  40   Critical care time was exclusive of:  Separately billable procedures and treating other patients   Critical care was necessary to treat or prevent imminent or life-threatening deterioration of the following conditions:  Cardiac failure   Critical care was time spent personally by me on the following activities:  Development of treatment plan with patient or surrogate, discussions with consultants, evaluation of patient's response to treatment, examination of patient, obtaining history from patient or surrogate, ordering and performing treatments and interventions, ordering and review of laboratory studies, ordering and review of radiographic studies, pulse oximetry, re-evaluation of patient's condition and review of old charts   I assumed direction of critical care for this patient from another  provider in my specialty: no       Medications Ordered in ED Medications  diltiazem (CARDIZEM) 1 mg/mL load via infusion 20 mg (20 mg Intravenous Bolus from Bag 04/04/23 1811)    And  diltiazem (CARDIZEM) 125 mg in dextrose 5% 125 mL (1 mg/mL) infusion (0 mg/hr Intravenous Stopped 04/04/23 1849)  metoprolol tartrate (LOPRESSOR) tablet 50 mg (50 mg Oral Given 04/04/23 2111)    ED Course/ Medical Decision Making/ A&P Clinical Course as of 04/04/23 2259  Tue Apr 04, 2023  1849 BP dropped to 80s, HR controlled in the  70s. Diltiazem drip stopped with improvement of BP in 120s. [VK]  2036 HR remains the 70's on ambulation. [VK]  2102 I spoke with Dr. Piedad Climes of cardiology who states diltiazem will last about 4 hours so recommend observing for 4 hours off the diltiazem. Recommended starting metoprolol 50 mg now and if does well can start 50 mg BID [VK]  2253 Patient remains rate controlled in the 90s to 100s.  He is stable for discharge home with follow-up in the A-fib clinic. [VK]    Clinical Course User Index [VK] Rexford Maus, DO                                 Medical Decision Making This patient presents to the ED with chief complaint(s) of fatigue, dizziness with pertinent past medical history of PVCs, hypertension, CVA which further complicates the presenting complaint. The complaint involves an extensive differential diagnosis and also carries with it a high risk of complications and morbidity.    The differential diagnosis includes ACS, arrhythmia, anemia, dehydration, electrolyte abnormality, orthostatic hypotension, infection, pulmonary edema, pleural effusion, pneumothorax, pneumonia, thyroid dysfunction  Additional history obtained: Additional history obtained from family Records reviewed Primary Care Documents and outpatient cardiology records  ED Course and Reassessment: On patient's arrival he was on new onset A-fib with RVR.  Patient is otherwise hemodynamically stable  without acute ischemic changes on EKG.  The patient did report an episode of chest pain yesterday and will of labs including troponin, electrolytes and TSH as well as chest x-ray.  The patient is unclear when his symptoms may have started and has had several symptoms of dizziness ongoing for a few weeks so recommended the patient for rate control as cardioversion is unsafe at this time.  The patient will be started on diltiazem follow-up and drip and he will be closely reassessed.  Independent labs interpretation:  The following labs were independently interpreted: within normal range  Independent visualization of imaging: - I independently visualized the following imaging with scope of interpretation limited to determining acute life threatening conditions related to emergency care: CXR, which revealed no acute disease  Consultation: - Consulted or discussed management/test interpretation w/ external professional: cardiology  Consideration for admission or further workup: Patient has no emergent conditions requiring admission or further work-up at this time and is stable for discharge home with primary care and cardiology follow-up  Social Determinants of health: N/A    Amount and/or Complexity of Data Reviewed Labs: ordered. Radiology: ordered.  Risk Prescription drug management.          Final Clinical Impression(s) / ED Diagnoses Final diagnoses:  Atrial fibrillation with RVR (HCC)    Rx / DC Orders ED Discharge Orders          Ordered    Amb referral to AFIB Clinic        04/04/23 1750    metoprolol tartrate (LOPRESSOR) 50 MG tablet  2 times daily        04/04/23 2256    apixaban (ELIQUIS) 5 MG TABS tablet  2 times daily        04/04/23 2256              Elayne Snare K, DO 04/04/23 2259

## 2023-04-05 NOTE — Progress Notes (Unsigned)
Cardiology Clinic Note   Patient Name: Bryan Hart Date of Encounter: 04/05/2023  Primary Care Provider:  [name], DO Primary Cardiologist:  None  Patient Profile    ***  Past Medical History    Past Medical History:  Diagnosis Date   Arthritis    HLD (hyperlipidemia)    Hypertension    dr  ed green      stress test   12 yrs ago   PVC's (premature ventricular contractions) 12 years ago   stress test done   Sleep apnea    Stroke [facility])    minor stroke 01/2018   Past Surgical History:  Procedure Laterality Date   ANKLE ARTHROSCOPY  06/27/2008   JOINT REPLACEMENT  06/27/2009   rt shoulder rotator cuff repair   knee surgeries  [id number]   x2   SHOULDER HEMI-ARTHROPLASTY  01/12/2012   Procedure: SHOULDER HEMI-ARTHROPLASTY;  Surgeon: Mable Paris, MD;  Location: [facility] OR;  Service: Orthopedics;  Laterality: Right;   SHOULDER SURGERY Right 07/2021   TOTAL KNEE ARTHROPLASTY Right 05/31/2022   Procedure: RIGHT TOTAL KNEE ARTHROPLASTY;  Surgeon: Kathryne Hitch, MD;  Location: [facility] OR;  Service: Orthopedics;  Laterality: Right;    Allergies  Allergies  Allergen Reactions   Adhesive [Tape] Rash    History of Present Illness    ***  Home Medications    Prior to Admission medications   Medication Sig Start Date End Date Taking? Authorizing Provider  apixaban (ELIQUIS) 5 MG TABS tablet Take 1 tablet (5 mg total) by mouth 2 (two) times daily. 04/04/23 05/04/23  Rexford Maus, DO  aspirin EC 81 MG tablet Take 81 mg by mouth daily. Swallow whole.    [provider]  atorvastatin (LIPITOR) 20 MG tablet Take 20 mg by mouth daily at 6 PM.    [provider]  methocarbamol (ROBAXIN) 500 MG tablet Take 1 tablet (500 mg total) by mouth every 6 (six) hours as needed for muscle spasms. 06/02/22   Kirtland Bouchard, PA-C  metoprolol tartrate (LOPRESSOR) 50 MG tablet Take 1 tablet (50 mg total) by mouth 2 (two) times daily. 04/04/23    Elayne Snare K, DO  verapamil (VERELAN PM) 180 MG 24 hr capsule Take 180 mg by mouth daily. 07/05/22   [provider]    Family History    Family History  Problem Relation Age of Onset   Heart disease Father    Hypertension Father    Heart attack Brother    Hypertension Brother    Stroke Paternal Grandmother    Heart attack Maternal Uncle    Heart disease Brother    Colon cancer Neg Hx    He indicated that his mother is deceased. He indicated that his father is deceased. He indicated that his sister is alive. He indicated that both of his brothers are alive. He indicated that the status of his paternal grandmother is unknown. He indicated that the status of his maternal uncle is unknown. He indicated that the status of his neg hx is unknown.  Social History    Social History   Socioeconomic History   Marital status: Married    Spouse name: Elease Hashimoto   Number of children: Not on file   Years of education: Not on file   Highest education level: Master's degree (e.g., MA, MS, MEng, MEd, MSW, MBA)  Occupational History   Not on file  Tobacco Use   Smoking status: Former  Current packs/day: 0.00    Average packs/day: 0.5 packs/day for 4.0 years (2.0 ttl pk-yrs)    Types: Cigarettes    Start date: 32    Quit date: 58    Years since quitting: 49.8   Smokeless tobacco: Never  Vaping Use   Vaping status: Never Used  Substance and Sexual Activity   Alcohol use: Yes    Alcohol/week: 1.0 - 2.0 standard drink of alcohol    Types: 1 - 2 Cans of beer per week    Comment: daily   Drug use: No   Sexual activity: Not on file  Other Topics Concern   Not on file  Social History Narrative   Lives home with spouse Elease Hashimoto.   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   Left handed    Social Determinants of Health   Financial Resource Strain: Not on file  Food Insecurity: Not on file  Transportation Needs: Not on file  Physical Activity: Not on file  Stress: Not on  file  Social Connections: Not on file  Intimate Partner Violence: Not on file     Review of Systems    General:  No chills, fever, night sweats or weight changes.  Cardiovascular:  No chest pain, dyspnea on exertion, edema, orthopnea, palpitations, paroxysmal nocturnal dyspnea. Dermatological: No rash, lesions/masses Respiratory: No cough, dyspnea Urologic: No hematuria, dysuria Abdominal:   No nausea, vomiting, diarrhea, bright red blood per rectum, melena, or hematemesis Neurologic:  No visual changes, wkns, changes in mental status. All other systems reviewed and are otherwise negative except as noted above.  Physical Exam    VS:  There were no vitals taken for this visit. , BMI There is no height or weight on file to calculate BMI. GEN: Well nourished, well developed, in no acute distress. HEENT: normal. Neck: Supple, no JVD, carotid bruits, or masses. Cardiac: RRR, no murmurs, rubs, or gallops. No clubbing, cyanosis, edema.  Radials/DP/PT 2+ and equal bilaterally.  Respiratory:  Respirations regular and unlabored, clear to auscultation bilaterally. GI: Soft, nontender, nondistended, BS + x 4. MS: no deformity or atrophy. Skin: warm and dry, no rash. Neuro:  Strength and sensation are intact. Psych: Normal affect.  Accessory Clinical Findings    Recent Labs: 05/24/2022: ALT 18 04/04/2023: BUN 23; Creatinine, Ser 1.27; Hemoglobin 13.6; Magnesium 2.0; Platelets 252; Potassium 4.0; Sodium 141; TSH 3.056   Recent Lipid Panel No results found for: "CHOL", "TRIG", "HDL", "CHOLHDL", "VLDL", "LDLCALC", "LDLDIRECT"       ECG personally reviewed by me today- ***          Assessment & Plan   1.  ***   Thomasene Ripple. Infinity Jeffords NP-C     04/05/2023, 4:48 PM [facility] [address] Office [phone] Fax [phone]    I spent***minutes examining this patient, reviewing medications, and using patient centered shared decision  making involving her cardiac care.  Prior to her visit I spent greater than 20 minutes reviewing her past medical history,  medications, and prior cardiac tests.

## 2023-04-06 ENCOUNTER — Ambulatory Visit: Payer: PPO | Attending: General Practice | Admitting: General Practice

## 2023-04-06 ENCOUNTER — Encounter: Payer: Self-pay | Admitting: General Practice

## 2023-04-06 VITALS — BP 112/76 | HR 64 | Ht 72.0 in | Wt 168.0 lb

## 2023-04-06 DIAGNOSIS — I10 Essential (primary) hypertension: Secondary | ICD-10-CM

## 2023-04-06 DIAGNOSIS — I4891 Unspecified atrial fibrillation: Secondary | ICD-10-CM | POA: Diagnosis not present

## 2023-04-06 DIAGNOSIS — I493 Ventricular premature depolarization: Secondary | ICD-10-CM | POA: Diagnosis not present

## 2023-04-06 DIAGNOSIS — I639 Cerebral infarction, unspecified: Secondary | ICD-10-CM

## 2023-04-06 DIAGNOSIS — G4733 Obstructive sleep apnea (adult) (pediatric): Secondary | ICD-10-CM | POA: Diagnosis not present

## 2023-04-06 NOTE — Patient Instructions (Addendum)
Medication Instructions:  The current medical regimen is effective;  continue present plan and medications as directed. Please refer to the Current Medication list given to you today.  *If you need a refill on your cardiac medications before your next appointment, please call your pharmacy*  Lab Work: NONE  Other Instructions MAKE SURE TO DISCUSS SLEEP DEVICES WITH YOUR DENTIST Please try to avoid these triggers: Do not use any products that have nicotine or tobacco in them. These include cigarettes, e-cigarettes, and chewing tobacco. If you need help quitting, ask your doctor. Eat heart-healthy foods. Talk with your doctor about the right eating plan for you. Exercise regularly as told by your doctor. Stay hydrated Do not drink alcohol, Caffeine or chocolate. Lose weight if you are overweight. Do not use drugs, including cannabis   Follow-Up: At [facility], you and your health needs are our priority.  As part of our continuing mission to provide you with exceptional heart care, we have created designated Provider Care Teams.  These Care Teams include your primary Cardiologist (physician) and Advanced Practice Providers (APPs -  Physician Assistants and Nurse Practitioners) who all work together to provide you with the care you need, when you need it.  Your next appointment:   3 month(s) Nanetta Batty, MD  or Edd Fabian, FNP      Provider:   You will follow up in the Atrial Fibrillation Clinic located at [facility]. Your provider will be: Clint R. Fenton, PA-C-KEEP SCHEDULED APPOINTMENT

## 2023-04-12 ENCOUNTER — Ambulatory Visit (HOSPITAL_COMMUNITY)
Admission: RE | Admit: 2023-04-12 | Discharge: 2023-04-12 | Disposition: A | Discharge status: Home / Self Care | Payer: PPO | Source: Ambulatory Visit | Attending: Physician Assistant | Admitting: Physician Assistant

## 2023-04-12 ENCOUNTER — Encounter (HOSPITAL_COMMUNITY): Payer: Self-pay | Admitting: Physician Assistant

## 2023-04-12 VITALS — BP 140/98 | HR 55 | Ht 72.0 in | Wt 172.4 lb

## 2023-04-12 DIAGNOSIS — Z7901 Long term (current) use of anticoagulants: Secondary | ICD-10-CM | POA: Insufficient documentation

## 2023-04-12 DIAGNOSIS — I10 Essential (primary) hypertension: Secondary | ICD-10-CM | POA: Insufficient documentation

## 2023-04-12 DIAGNOSIS — D6869 Other thrombophilia: Secondary | ICD-10-CM | POA: Insufficient documentation

## 2023-04-12 DIAGNOSIS — I480 Paroxysmal atrial fibrillation: Secondary | ICD-10-CM | POA: Insufficient documentation

## 2023-04-12 DIAGNOSIS — G4733 Obstructive sleep apnea (adult) (pediatric): Secondary | ICD-10-CM | POA: Diagnosis not present

## 2023-04-12 DIAGNOSIS — I4891 Unspecified atrial fibrillation: Secondary | ICD-10-CM | POA: Diagnosis present

## 2023-04-12 MED ORDER — METOPROLOL TARTRATE 50 MG PO TABS: 50.0000 mg | ORAL_TABLET | Freq: Two times a day (BID) | ORAL | 5 refills | Status: DC

## 2023-04-12 MED ORDER — APIXABAN 5 MG PO TABS: 5.0000 mg | ORAL_TABLET | Freq: Two times a day (BID) | ORAL | 5 refills | Status: DC

## 2023-04-12 NOTE — Progress Notes (Signed)
Primary Care Physician: [name], DO Primary Cardiologist: Nanetta Batty, MD Electrophysiologist: None  Referring Physician: [facility]   Bryan Hart is a 72 y.o. male with a history of PVCs, HTN, CVA, OSA, atrial fibrillation who presents for follow up in the [facility] Atrial Fibrillation [facility]. Patient was seen by his PCP 04/04/23 for dizziness and fatiuge. ECG showed afib with RVR and he was directed to the ED. He received IV diltiazem and his blood pressure dropped to 80 systolic. Diltiazem gtt. was stopped and his blood pressure improved to the 120s. His heart rate remained in the 70s with ambulation. Cardiology was consulted. It was recommended that metoprolol 50 mg twice daily be started. He tolerated the medication well. He remained in atrial fibrillation with controlled rate in the 90s-100s. He was also started on Eliquis for a CHADS2VASC score of 4.  On follow up today, patient is back in SR. He does feel that his fatigue and dizziness have improved. He is tolerating the medication without difficulty. He is working with his dentist to get an oral appliance for his OSA.   Today, he denies symptoms of palpitations, chest pain, shortness of breath, orthopnea, PND, lower extremity edema, dizziness, presyncope, syncope,  bleeding, or neurologic sequela. The patient is tolerating medications without difficulties and is otherwise without complaint today.    Atrial Fibrillation Risk Factors:  he does have symptoms or diagnosis of sleep apnea. he does not have a history of rheumatic fever.   Atrial Fibrillation Management history:  Previous antiarrhythmic drugs: none Previous cardioversions: none Previous ablations: none Anticoagulation history: Eliquis  ROS- All systems are reviewed and negative except as per the HPI above.  Past Medical History:  Diagnosis Date   Arthritis    HLD (hyperlipidemia)    Hypertension    dr  ed green      stress test   12 yrs ago    PVC's (premature ventricular contractions) 12 years ago   stress test done   Sleep apnea    Stroke [facility])    minor stroke 01/2018    Current Outpatient Medications  Medication Sig Dispense Refill   apixaban (ELIQUIS) 5 MG TABS tablet Take 1 tablet (5 mg total) by mouth 2 (two) times daily. 60 tablet 0   aspirin EC 81 MG tablet Take 81 mg by mouth daily. Swallow whole.     atorvastatin (LIPITOR) 20 MG tablet Take 20 mg by mouth daily at 6 PM.     methocarbamol (ROBAXIN) 500 MG tablet Take 1 tablet (500 mg total) by mouth every 6 (six) hours as needed for muscle spasms. 40 tablet 1   metoprolol tartrate (LOPRESSOR) 50 MG tablet Take 1 tablet (50 mg total) by mouth 2 (two) times daily. 30 tablet 0   No current facility-administered medications for this encounter.    Physical Exam: BP (!) 140/98   Pulse (!) 55   Ht 6' (1.829 m)   Wt 78.2 kg   BMI 23.38 kg/m   GEN: Well nourished, well developed in no acute distress NECK: No JVD; No carotid bruits CARDIAC: Regular rate and rhythm, no murmurs, rubs, gallops RESPIRATORY:  Clear to auscultation without rales, wheezing or rhonchi  ABDOMEN: Soft, non-tender, non-distended EXTREMITIES:  No edema; No deformity   Wt Readings from Last 3 Encounters:  04/12/23 78.2 kg  04/06/23 76.2 kg  04/04/23 80.2 kg     EKG today demonstrates  SB Vent. rate 55 BPM PR interval 162 ms  QRS duration 90 ms QT/QTcB 426/407 ms  Echo 03/05/18 demonstrated  - Left ventricle: The cavity size was normal. Wall thickness was    normal. Systolic function was normal. The estimated ejection    fraction was in the range of 55% to 60%. Wall motion was normal;    there were no regional wall motion abnormalities. Doppler    parameters are consistent with abnormal left ventricular    relaxation (grade 1 diastolic dysfunction).  - Aortic valve: There was no stenosis. There was trivial    regurgitation.  - Aorta: Mildly dilated aortic root.  - Mitral valve: Mildly  calcified annulus. There was trivial    regurgitation.  - Left atrium: The atrium was mildly dilated.  - Right ventricle: The cavity size was normal. Systolic function    was normal.  - Tricuspid valve: Peak RV-RA gradient (S): 29 mm Hg.  - Pulmonary arteries: PA peak pressure: 32 mm Hg (S).  - Inferior vena cava: The vessel was normal in size. The    respirophasic diameter changes were in the normal range (>= 50%),    consistent with normal central venous pressure.  - Pericardium, extracardiac: Pleural effusion noted.   Impressions:   - Normal LV size and systolic function, EF 55-60%. Normal RV size    and systolic function. Borderline pulmonary hypertension.    CHA2DS2-VASc Score = 4  The patient's score is based upon: CHF History: 0 HTN History: 1 Diabetes History: 0 Stroke History: 2 Vascular Disease History: 0 Age Score: 1 Gender Score: 0       ASSESSMENT AND PLAN: Paroxysmal Atrial Fibrillation (ICD10:  I48.0) The patient's CHA2DS2-VASc score is 4, indicating a 4.8% annual risk of stroke.   General education about afib provided and questions answered. We also discussed his stroke risk and the risks and benefits of anticoagulation. He has spontaneously converted back to SR and is feeling well.  Check echocardiogram Continue Lopressor 50 mg BID Continue Eliquis 5 mg BID Can consider AAD vs ablation if he has more frequent afib.   Secondary Hypercoagulable State (ICD10:  D68.69) The patient is at significant risk for stroke/thromboembolism based upon his CHA2DS2-VASc Score of 4.  Continue Apixaban (Eliquis).   OSA  The importance of adequate treatment of sleep apnea was discussed today in order to improve our ability to maintain sinus rhythm long term. He did not tolerate CPAP. He is working with his dentist to get an oral appliance.   HTN Mildly elevated today, within the normal range at home.  No changes today. Continue to monitor.    Follow up with Edd Fabian as scheduled. AF clinic in 6 months.        Jorja Loa PA-C Afib Clinic [facility] [address] [place], [place] [address] [phone]

## 2023-04-12 NOTE — Patient Instructions (Signed)
Stop aspirin.

## 2023-04-19 ENCOUNTER — Other Ambulatory Visit (HOSPITAL_COMMUNITY): Payer: Self-pay | Admitting: *Deleted

## 2023-04-19 MED ORDER — APIXABAN 5 MG PO TABS: 5.0000 mg | ORAL_TABLET | Freq: Two times a day (BID) | ORAL | 5 refills | Status: DC

## 2023-04-19 MED ORDER — METOPROLOL TARTRATE 50 MG PO TABS: 50.0000 mg | ORAL_TABLET | Freq: Two times a day (BID) | ORAL | 5 refills | Status: DC

## 2023-05-12 ENCOUNTER — Ambulatory Visit (HOSPITAL_COMMUNITY)
Admission: RE | Admit: 2023-05-12 | Discharge: 2023-05-12 | Disposition: A | Discharge status: Home / Self Care | Payer: PPO | Source: Ambulatory Visit | Attending: Physician Assistant | Admitting: Physician Assistant

## 2023-05-12 DIAGNOSIS — I4430 Unspecified atrioventricular block: Secondary | ICD-10-CM | POA: Insufficient documentation

## 2023-05-12 DIAGNOSIS — I4891 Unspecified atrial fibrillation: Secondary | ICD-10-CM | POA: Insufficient documentation

## 2023-05-12 DIAGNOSIS — I77819 Aortic ectasia, unspecified site: Secondary | ICD-10-CM | POA: Diagnosis not present

## 2023-05-12 DIAGNOSIS — Z87891 Personal history of nicotine dependence: Secondary | ICD-10-CM | POA: Insufficient documentation

## 2023-05-12 DIAGNOSIS — G4730 Sleep apnea, unspecified: Secondary | ICD-10-CM | POA: Diagnosis not present

## 2023-05-12 DIAGNOSIS — R42 Dizziness and giddiness: Secondary | ICD-10-CM | POA: Diagnosis not present

## 2023-05-12 DIAGNOSIS — Z8673 Personal history of transient ischemic attack (TIA), and cerebral infarction without residual deficits: Secondary | ICD-10-CM | POA: Insufficient documentation

## 2023-05-12 DIAGNOSIS — R5383 Other fatigue: Secondary | ICD-10-CM | POA: Insufficient documentation

## 2023-05-12 DIAGNOSIS — E785 Hyperlipidemia, unspecified: Secondary | ICD-10-CM | POA: Diagnosis not present

## 2023-05-12 DIAGNOSIS — I480 Paroxysmal atrial fibrillation: Secondary | ICD-10-CM | POA: Diagnosis not present

## 2023-05-12 DIAGNOSIS — I10 Essential (primary) hypertension: Secondary | ICD-10-CM | POA: Insufficient documentation

## 2023-05-12 LAB — ECHOCARDIOGRAM COMPLETE
AR max vel: 3.93 cm2
AV Area VTI: 3.92 cm2
AV Area mean vel: 3.76 cm2
AV Mean grad: 3 mm[Hg]
AV Peak grad: 6.7 mm[Hg]
Ao pk vel: 1.29 m/s
Area-P 1/2: 2.62 cm2
P 1/2 time: 683 ms
S' Lateral: 3.2 cm

## 2023-05-17 DIAGNOSIS — I10 Essential (primary) hypertension: Secondary | ICD-10-CM | POA: Diagnosis not present

## 2023-05-17 DIAGNOSIS — I480 Paroxysmal atrial fibrillation: Secondary | ICD-10-CM | POA: Diagnosis not present

## 2023-05-17 DIAGNOSIS — R413 Other amnesia: Secondary | ICD-10-CM | POA: Diagnosis not present

## 2023-05-17 DIAGNOSIS — R441 Visual hallucinations: Secondary | ICD-10-CM | POA: Diagnosis not present

## 2023-05-17 DIAGNOSIS — R350 Frequency of micturition: Secondary | ICD-10-CM | POA: Diagnosis not present

## 2023-05-22 ENCOUNTER — Ambulatory Visit: Payer: PPO | Admitting: Podiatry

## 2023-05-22 ENCOUNTER — Encounter: Payer: Self-pay | Admitting: Podiatry

## 2023-05-22 DIAGNOSIS — M79674 Pain in right toe(s): Secondary | ICD-10-CM | POA: Diagnosis not present

## 2023-05-22 DIAGNOSIS — B351 Tinea unguium: Secondary | ICD-10-CM | POA: Diagnosis not present

## 2023-05-22 DIAGNOSIS — M79675 Pain in left toe(s): Secondary | ICD-10-CM

## 2023-05-22 MED ORDER — CICLOPIROX 8 % EX SOLN: Freq: Every day | CUTANEOUS | 2 refills | Status: DC

## 2023-05-22 NOTE — Patient Instructions (Signed)
Ciclopirox Topical Solution What is this medication? CICLOPIROX (sye kloe PEER ox) treats fungal infections of the nails. It belongs to a group of medications called antifungals. It will not treat infections caused by bacteria or viruses. This medicine may be used for other purposes; ask your health care provider or pharmacist if you have questions. COMMON BRAND NAME(S): Ciclodan Nail Solution, CNL8, Penlac What should I tell my care team before I take this medication? They need to know if you have any of these conditions: Diabetes (high blood sugar) Immune system problems Organ transplant Receiving steroid inhalers, cream, or lotion Seizures Tingling of the fingers or toes or other nerve disorder An unusual or allergic reaction to ciclopirox, other medications, foods, dyes, or preservatives Pregnant or trying to get pregnant Breast-feeding How should I use this medication? This medication is for external use only. Do not take by mouth. Wash your hands before and after use. If you are treating your hands, only wash your hands before use. Do not get it in your eyes. If you do, rinse your eyes with plenty of cool tap water. Use it as directed on the prescription label at the same time every day. Do not use it more often than directed. Use the medication for the full course as directed by your care team, even if you think you are better. Do not stop using it unless your care team tells you to stop it early. Apply a thin film of the medication to the affected area. Talk to your care team about the use of this medication in children. While it may be prescribed for children as young as 12 years for selected conditions, precautions do apply. Overdosage: If you think you have taken too much of this medicine contact a poison control center or emergency room at once. NOTE: This medicine is only for you. Do not share this medicine with others. What if I miss a dose? If you miss a dose, use it as soon as  you can. If it is almost time for your next dose, use only that dose. Do not use double or extra doses. What may interact with this medication? Interactions are not expected. Do not use any other skin products without telling your care team. This list may not describe all possible interactions. Give your health care provider a list of all the medicines, herbs, non-prescription drugs, or dietary supplements you use. Also tell them if you smoke, drink alcohol, or use illegal drugs. Some items may interact with your medicine. What should I watch for while using this medication? Visit your care team for regular checks on your progress. It may be some time before you see the benefit from this medication. Do not use nail polish or other nail cosmetic products on the treated nails. Removal of the unattached, infected nail by your care team is needed with use of this medication. If you have diabetes or numbness in your fingers or toes, talk to your care team about proper nail care. What side effects may I notice from receiving this medication? Side effects that you should report to your care team as soon as possible: Allergic reactions--skin rash, itching, hives, swelling of the face, lips, tongue, or throat Burning, itching, crusting, or peeling of treated skin Side effects that usually do not require medical attention (report to your care team if they continue or are bothersome): Change in nail shape, thickness, or color Mild skin irritation, redness, or dryness This list may not describe all possible side  effects. Call your doctor for medical advice about side effects. You may report side effects to FDA at 1-800-FDA-1088. Where should I keep my medication? Keep out of the reach of children and pets. Store at room temperature between 20 and 25 degrees C (68 and 77 degrees F). This medication is flammable. Avoid exposure to heat, fire, flame, and smoking. Get rid of medications that are no longer needed  or have expired: Take the medication to a medication take-back program. Check with your pharmacy or law enforcement to find a location. If you cannot return the medication, check the label or package insert to see if the medication should be thrown out in the garbage or flushed down the toilet. If you are not sure, ask your care team. If it is safe to put in the trash, take the medication out of the container. Mix the medication with cat litter, dirt, coffee grounds, or other unwanted substance. Seal the mixture in a bag or container. Put it in the trash. NOTE: This sheet is a summary. It may not cover all possible information. If you have questions about this medicine, talk to your doctor, pharmacist, or health care provider.  2024 Elsevier/Gold Standard (2021-10-11 00:00:00)

## 2023-05-22 NOTE — Progress Notes (Signed)
Subjective: Chief Complaint  Patient presents with   Debridement    Trim toenails   72 year old male presents after the above concerns.  States his nails get long they are causing pain.  Denies any swelling redness or any drainage.  No open lesions.  He has no other concerns today.   Objective: AAO x3, NAD DP/PT pulses palpable bilaterally, CRT less than 3 seconds Nails are hypertrophic, dystrophic, brittle, discolored, elongated 10. No surrounding redness or drainage. Tenderness nails 1-5 bilaterally. No open lesions or pre-ulcerative lesions are identified today. No pain with calf compression, swelling, warmth, erythema  Assessment: Symptomatic onychomycosis  Plan: -All treatment options discussed with the patient including all alternatives, risks, complications.  -Sharply debrided nails x 10 without any complications or bleeding.  Discussed treatment options for nail fungus. Prescribed Penlac, discussed side effects, duration of use, success rates. -Patient encouraged to call the office with any questions, concerns, change in symptoms.   Vivi Barrack DPM

## 2023-05-23 ENCOUNTER — Telehealth: Payer: Self-pay

## 2023-05-23 NOTE — Telephone Encounter (Signed)
PA request received from covermymeds for ciclopirox 8% solution. PA submitted through covermymeds and waiting on response. May take between 24-72 hours  CoverMyMeds Key: [id number]

## 2023-05-24 ENCOUNTER — Telehealth: Payer: Self-pay

## 2023-05-24 NOTE — Telephone Encounter (Signed)
Patient called and left a message. Penlac was denied by his insurance and he wanted to know what he should do. I called back and left a detailed message on his cell phone. He can check to see if GoodRx has a good price on Penlac. Or he can try Formula7 or Funginail OTC. Advised to please call back with any questions. Thanks

## 2023-05-24 NOTE — Telephone Encounter (Signed)
PA was denied. Denial letter is under media tab

## 2023-06-01 ENCOUNTER — Ambulatory Visit: Payer: PPO | Admitting: Orthopaedic Surgery

## 2023-06-01 ENCOUNTER — Encounter: Payer: Self-pay | Admitting: Orthopaedic Surgery

## 2023-06-01 ENCOUNTER — Other Ambulatory Visit (INDEPENDENT_AMBULATORY_CARE_PROVIDER_SITE_OTHER): Payer: PPO

## 2023-06-01 ENCOUNTER — Telehealth: Payer: Self-pay | Admitting: Diagnostic Neuroimaging

## 2023-06-01 DIAGNOSIS — Z96651 Presence of right artificial knee joint: Secondary | ICD-10-CM | POA: Diagnosis not present

## 2023-06-01 NOTE — Telephone Encounter (Signed)
Wife returned phone call. Husband came in the room. She did not want to discuss in front of him. She would like a call back.

## 2023-06-01 NOTE — Progress Notes (Signed)
The patient is a 72 year old gentleman who is here for follow-up almost exactly 1 year status post a right total knee replacement.  He reports good range of motion and strength in his right knee and no issues.  He denies any issues thus far with his left knee.  He has known osteoarthritis of the left knee.  His right knee is examined and his extension and flexion are full and the knee feels ligamentously stable.  His left knee has some varus malalignment but no pain throughout the arc of motion of his knee but there is patellofemoral crepitation.  A standing AP and lateral of the right knee shows a press-fit implant that is well-seated with good alignment and no evidence of loosening.  The AP view also shows his left knee that has varus malalignment and bone-on-bone wear on the medial compartment.  Thus far he is asymptomatic with his left knee so we will just watch this for now.  His right knee is doing great.  Follow-up can be as needed unless he does develop any issues.  All questions and concerns were addressed and answered.

## 2023-06-01 NOTE — Telephone Encounter (Signed)
Pt's wife, pt is confused; sometime does not recognize me as his wife;he believes I am another person impersonating me and can change quickly, sometime thinks our house is  our house and sometimes think it is not. Pt is asking what is going on with him. I do not know what to tell him. Would like a call from the nurse.

## 2023-06-01 NOTE — Telephone Encounter (Signed)
I called patient's wife, Elease Hashimoto, per DPR. Patient answered, reports that wife is unavailable, can try again later.  Should discuss if patient has follow up with PCP to rule out infections causing acute confusion. Any safety concerns should prompt a 911 call and/or ER evaluation.   Of note, patient has an appointment with Dr. Marjory Lies on 06/12/23 and these concerns may be best addressed at that appointment.

## 2023-06-01 NOTE — Telephone Encounter (Signed)
I called patient's wife back. No answer, left a message asking her to call us back.

## 2023-06-01 NOTE — Telephone Encounter (Signed)
Patient's wife returned my call. Over the past few months, patient's memory has declined and patient is having acute confusion. He thinks that there are two "Pats" in the house (another Dennie Bible other than his wife). Patient was checked out for acute infections by his PCP and labs and UA were reportedly okay. PCP recommended follow up with neuro.  Patient's wife reports that he has not harmed her or himself but he does still drive. I recommended that patient's wife limit and preferably eliminate his driving until further discussion with Dr. Marjory Lies. I advised her to call 911 if there is ever a safety concern for herself or patient.  They have an appointment on 06/12/23 already with Dr. Marjory Lies and should be able to address all of this with him then. Patient's wife wanted to ensure that there is nothing that can be done in the interim. I confirmed his current medications on file are correct.  Patient's wife's cell phone number is [phone].

## 2023-06-05 ENCOUNTER — Telehealth: Payer: Self-pay | Admitting: *Deleted

## 2023-06-05 NOTE — Telephone Encounter (Signed)
Ortho bundle 1 year f/u completed.

## 2023-06-12 ENCOUNTER — Ambulatory Visit: Payer: PPO | Admitting: Diagnostic Neuroimaging

## 2023-06-12 ENCOUNTER — Encounter: Payer: Self-pay | Admitting: Diagnostic Neuroimaging

## 2023-06-12 ENCOUNTER — Telehealth: Payer: Self-pay | Admitting: Diagnostic Neuroimaging

## 2023-06-12 VITALS — BP 162/87 | HR 61 | Ht 72.0 in | Wt 177.8 lb

## 2023-06-12 DIAGNOSIS — F03A18 Unspecified dementia, mild, with other behavioral disturbance: Secondary | ICD-10-CM | POA: Diagnosis not present

## 2023-06-12 DIAGNOSIS — R413 Other amnesia: Secondary | ICD-10-CM | POA: Diagnosis not present

## 2023-06-12 NOTE — Patient Instructions (Addendum)
  MEMORY LOSS (MMSE 22/30; progression of sxs per patient and wife) - check ATN panel - consider memantine 10mg  at bedtime; increase to twice a day after 1-2 weeks - try to stay active physically and get some exercise (at least 15-30 minutes per day) - eat a nutritious diet with lean protein, plants / vegetables, whole grains; avoid ultra-processed foods - increase social activities, brain stimulation, games, puzzles, hobbies, crafts, arts, music; try new activities; keep it fun! - aim for at least 7-8 hours sleep per night (or more) - avoid smoking and alcohol - caution with medications, finances; no driving - safety / supervision issues reviewed - caregiver resources provided (including [employer], senior resources of [employer], [employer])

## 2023-06-12 NOTE — Telephone Encounter (Signed)
[facility] NPR sent to GI [phone]

## 2023-06-12 NOTE — Progress Notes (Signed)
[facility]  PATIENT: Bryan Hart DOB: 01/09/1951  REFERRING CLINICIAN: [name], DO  HISTORY FROM: patient  REASON FOR VISIT: follow up    HISTORICAL  CHIEF COMPLAINT:  Chief Complaint  Patient presents with   Follow-up    Patient in room #7 with his wife. Patient states he here to discuss some memory issues.    HISTORY OF PRESENT ILLNESS:   UPDATE (06/12/23, VRP): Since last visit, more progression of symptoms. Decline in sense of direction. Sometimes does not recognize own wife. Memory decline has continued.  UPDATE (11/01/22, VRP): Since last visit, doing about the same. Symptoms are stable to slightly progressed. Severity is mild. No alleviating or aggravating factors. Had .    UPDATE (08/17/21, VRP): Since last visit, doing well. Mild right hand tremor (mother had tremor; 2 other brothers have tremor now). Mild short term memory loss, diff with naming, since last 2 years. No major changes ADLs. Overall doing well.   UPDATE (03/19/19, VRP): Since last visit, doing well. Symptoms are stable. Severity is mild. No alleviating or aggravating factors. Tolerating meds.  PRIOR HPI (03/12/18): 72 year old left-handed male here for evaluation of stroke.  History of hypertension and sleep apnea.  Approximately 3 weeks of patient noticed mild numbness in his right hand, digits 3 and 4, with some clumsiness of the right hand.  He had recently started a muscle relaxer and thought this may be a side effect.  Few days later patient went into PCP for evaluation, who recognized possible stroke symptoms and ordered MRI of the brain.  MRI of the brain which confirmed acute to subacute ischemic infarction of the left precentral gyrus, correlating to patient's symptoms.  Echocardiogram and carotid ultrasound were obtained which were unremarkable.  Patient was started on aspirin 81 mg/day and referred here for further follow-up.  Patient denies any numbness or weakness in the  right face and right leg.  No headaches.  No recent injuries or traumas.  Symptoms are gradually improving.  Patient was diagnosed with mild to moderate sleep apnea 6-7 years ago, but could not tolerate CPAP mask.  He did not have any other further follow-up.   REVIEW OF SYSTEMS: Full 14 system review of systems performed and negative with exception of: as per HPI.   ALLERGIES: Allergies  Allergen Reactions   Adhesive [Tape] Rash    HOME MEDICATIONS: Outpatient Medications Prior to Visit  Medication Sig Dispense Refill   apixaban (ELIQUIS) 5 MG TABS tablet Take 1 tablet (5 mg total) by mouth 2 (two) times daily. 60 tablet 5   atorvastatin (LIPITOR) 20 MG tablet Take 20 mg by mouth daily at 6 PM.     methocarbamol (ROBAXIN) 500 MG tablet Take 1 tablet (500 mg total) by mouth every 6 (six) hours as needed for muscle spasms. 40 tablet 1   metoprolol tartrate (LOPRESSOR) 50 MG tablet Take 1 tablet (50 mg total) by mouth 2 (two) times daily. 60 tablet 5   ciclopirox (PENLAC) 8 % solution Apply topically at bedtime. Apply over nail and surrounding skin. Apply daily over previous coat. After seven (7) days, may remove with alcohol and continue cycle. 6.6 mL 2   No facility-administered medications prior to visit.    PAST MEDICAL HISTORY: Past Medical History:  Diagnosis Date   Arthritis    HLD (hyperlipidemia)    Hypertension    dr  ed green      stress test   12 yrs ago   PVC's (  premature ventricular contractions) 12 years ago   stress test done   Sleep apnea    Stroke [facility])    minor stroke 01/2018    PAST SURGICAL HISTORY: Past Surgical History:  Procedure Laterality Date   ANKLE ARTHROSCOPY  06/27/2008   JOINT REPLACEMENT  06/27/2009   rt shoulder rotator cuff repair   knee surgeries  5188,4166   x2   SHOULDER HEMI-ARTHROPLASTY  01/12/2012   Procedure: SHOULDER HEMI-ARTHROPLASTY;  Surgeon: Bryan Paris, MD;  Location: [facility] OR;  Service: Orthopedics;  Laterality:  Right;   SHOULDER SURGERY Right 07/2021   TOTAL KNEE ARTHROPLASTY Right 05/31/2022   Procedure: RIGHT TOTAL KNEE ARTHROPLASTY;  Surgeon: Bryan Hitch, MD;  Location: [facility] OR;  Service: Orthopedics;  Laterality: Right;    FAMILY HISTORY: Family History  Problem Relation Age of Onset   Heart disease Father    Hypertension Father    Heart attack Brother    Hypertension Brother    Stroke Paternal Grandmother    Heart attack Maternal Uncle    Heart disease Brother    Colon cancer Neg Hx     SOCIAL HISTORY: Social History   Socioeconomic History   Marital status: Married    Spouse name: Bryan Hart   Number of children: Not on file   Years of education: Not on file   Highest education level: Master's degree (e.g., MA, MS, MEng, MEd, MSW, MBA)  Occupational History   Not on file  Tobacco Use   Smoking status: Former    Current packs/day: 0.00    Average packs/day: 0.5 packs/day for 4.0 years (2.0 ttl pk-yrs)    Types: Cigarettes    Start date: 53    Quit date: 10    Years since quitting: 49.9   Smokeless tobacco: Never   Tobacco comments:    Former smoker 04/12/23  Vaping Use   Vaping status: Never Used  Substance and Sexual Activity   Alcohol use: Yes    Alcohol/week: 1.0 - 2.0 standard drink of alcohol    Types: 1 - 2 Cans of beer per week    Comment: daily   Drug use: No   Sexual activity: Not on file  Other Topics Concern   Not on file  Social History Narrative   Lives home with spouse Bryan Hart.   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   Left handed    Social Drivers of Corporate investment banker Strain: Not on file  Food Insecurity: Not on file  Transportation Needs: Not on file  Physical Activity: Not on file  Stress: Not on file  Social Connections: Not on file  Intimate Partner Violence: Not on file     PHYSICAL EXAM  GENERAL EXAM/CONSTITUTIONAL: Vitals:  Vitals:   06/12/23 1514  BP: (!) 162/87  Pulse: 61  Weight: 177 lb 12.8 oz (80.6  kg)  Height: 6' (1.829 m)   Body mass index is 24.11 kg/m. Wt Readings from Last 3 Encounters:  06/12/23 177 lb 12.8 oz (80.6 kg)  04/12/23 172 lb 6.4 oz (78.2 kg)  04/06/23 168 lb (76.2 kg)   Patient is in no distress; well developed, nourished and groomed; neck is supple  CARDIOVASCULAR: Examination of carotid arteries is normal; no carotid bruits Regular rate and rhythm, no murmurs Examination of peripheral vascular system by observation and palpation is normal  EYES: Ophthalmoscopic exam of optic discs and posterior segments is normal; no papilledema or hemorrhages No results found.  MUSCULOSKELETAL: Gait, strength, tone,  movements noted in Neurologic exam below  NEUROLOGIC: MENTAL STATUS:     06/12/2023    3:17 PM 11/01/2022    2:42 PM 08/17/2021    8:55 AM  MMSE - Mini Mental State Exam  Orientation to time 4 5 5   Orientation to Place 4 5 5   Registration 3 3 3   Attention/ Calculation 1 2 4   Recall 3 3 2   Language- name 2 objects 2 2 2   Language- repeat 1 1 1   Language- follow 3 step command 2 3 3   Language- read & follow direction 1 1 1   Write a sentence 1 1 1   Copy design 0 1 1  Total score 22 27 28    awake, alert, oriented to person, place and time recent and remote memory intact normal attention and concentration language fluent, comprehension intact, naming intact fund of knowledge appropriate  CRANIAL NERVE:  2nd - no papilledema on fundoscopic exam 2nd, 3rd, 4th, 6th - pupils equal and reactive to light, visual fields full to confrontation, extraocular muscles intact, no nystagmus 5th - facial sensation symmetric 7th - facial strength symmetric 8th - hearing intact 9th - palate elevates symmetrically, uvula midline 11th - shoulder shrug symmetric 12th - tongue protrusion midline  MOTOR:  MILD POSTURAL TREMOR IN RUE; MILD TREMOR IN HEAD AND LEFT HAND normal bulk and tone, full strength in the BUE, BLE  SENSORY:  normal and symmetric to light  touch, temperature, vibration  COORDINATION:  finger-nose-finger, fine finger movements normal  REFLEXES:  deep tendon reflexes TRACE and symmetric  GAIT/STATION:  narrow based gait     DIAGNOSTIC DATA (LABS, IMAGING, TESTING) - I reviewed patient records, labs, notes, testing and imaging myself where available.  Lab Results  Component Value Date   WBC 5.1 04/04/2023   HGB 13.6 04/04/2023   HCT 41.2 04/04/2023   MCV 94.9 04/04/2023   PLT 252 04/04/2023      Component Value Date/Time   NA 141 04/04/2023 1736   K 4.0 04/04/2023 1736   CL 105 04/04/2023 1736   CO2 29 04/04/2023 1736   GLUCOSE 99 04/04/2023 1736   BUN 23 04/04/2023 1736   CREATININE 1.27 (H) 04/04/2023 1736   CALCIUM 9.3 04/04/2023 1736   PROT 6.6 05/24/2022 0931   ALBUMIN 3.5 05/24/2022 0931   AST 22 05/24/2022 0931   ALT 18 05/24/2022 0931   ALKPHOS 68 05/24/2022 0931   BILITOT 0.7 05/24/2022 0931   GFRNONAA 59 (L) 04/04/2023 1736   GFRAA >60 02/24/2018 1230   No results found for: "CHOL", "HDL", "LDLCALC", "LDLDIRECT", "TRIG", "CHOLHDL" No results found for: "HGBA1C" No results found for: "VITAMINB12" Lab Results  Component Value Date   TSH 3.056 04/04/2023     03/05/18 TTE  - Normal LV size and systolic function, EF 55-60%. Normal RV size   and systolic function. Borderline pulmonary hypertension.  03/05/18 carotid u/s Right Carotid: Velocities in the right ICA are consistent with a 1-39% stenosis. Left Carotid: Velocities in the left ICA are consistent with a 1-39% stenosis. Vertebrals:  Bilateral vertebral arteries demonstrate antegrade flow. Subclavians: Normal flow hemodynamics were seen in bilateral subclavian arteries.  02/24/18 MRI brain (with and without) [I reviewed images myself and agree with interpretation. -VRP]  1. 1 cm acute infarct on the left motor strip at the hand knob. 2. 6 mm enhancing nodule medial to the right V4 segment. A schwannoma or benign enhancing foramen  magnum lesion is favored. Further discussion above. Recommend follow-up brain MRI  to ensure stability. 3. Small remote right frontal cortex infarct.  04/10/19 MRI brain (with and without) demonstrating: - Mild scattered periventricular and subcortical foci of chronic small vessel ischemic disease. Small bilateral frontal cortical ischemic infarcts.  - Small, stable benign enhancing lesion medial to the right vertebral artery (V4 segment).  - No acute findings.  ASSESSMENT AND PLAN  72 y.o. year old male here with HTN, sleep apnea, here for stroke evaluation.   Ddx: left frontal stroke (artery-artery embolism vs small vessel thrombosis)  1. Memory loss   2. Mild dementia with other behavioral disturbance, unspecified dementia type (HCC)      PLAN:  MEMORY LOSS (MMSE 22/30; progression of sxs per patient and wife) - check ATN panel - consider memantine 10mg  at bedtime; increase to twice a day after 1-2 weeks - try to stay active physically and get some exercise (at least 15-30 minutes per day) - eat a nutritious diet with lean protein, plants / vegetables, whole grains; avoid ultra-processed foods - increase social activities, brain stimulation, games, puzzles, hobbies, crafts, arts, music; try new activities; keep it fun! - aim for at least 7-8 hours sleep per night (or more) - avoid smoking and alcohol - caution with medications, finances; no driving - safety / supervision issues reviewed - caregiver resources provided (including [employer], senior resources of [employer], [employer])  STROKE PREVENTION - continue eliquis (afib), metoprolol, statin   TREMOR  - mild essential tremor; monitor; consider propranolol or primidone  VERTEBRAL ARTERY LESION - non-specific lesion; monitor   SLEEP APNEA - follow up with Dr. Vickey Huger  No orders of the defined types were placed in this encounter.  Orders Placed This Encounter  Procedures   MR BRAIN W WO CONTRAST    ATN PROFILE   Return in about 6 months (around 12/11/2023) for MyChart visit (15 min).    Suanne Marker, MD 06/12/2023, 3:53 PM Certified in Neurology, Neurophysiology and Neuroimaging  [facility] [address], [address] [place], [place] [address] [phone]

## 2023-06-15 LAB — ATN PROFILE
A -- Beta-amyloid 42/40 Ratio: 0.109 (ref >0.102)
Beta-amyloid 40: 195.55 pg/mL
Beta-amyloid 42: 21.39 pg/mL
N -- NfL, Plasma: 4.52 pg/mL (ref 0.00–7.64)
T -- p-tau181: 1.33 pg/mL — ABNORMAL HIGH (ref 0.00–0.97)

## 2023-06-28 DIAGNOSIS — J189 Pneumonia, unspecified organism: Secondary | ICD-10-CM

## 2023-06-28 HISTORY — DX: Pneumonia, unspecified organism: J18.9

## 2023-06-29 ENCOUNTER — Other Ambulatory Visit: Payer: Self-pay | Admitting: Diagnostic Neuroimaging

## 2023-06-29 MED ORDER — ALPRAZOLAM 0.5 MG PO TABS
0.5000 mg | ORAL_TABLET | Freq: Every evening | ORAL | 0 refills | Status: DC | PRN
Start: 2023-06-29 — End: 2023-07-07

## 2023-06-29 NOTE — Telephone Encounter (Signed)
 Pt called stating his MRI has been scheduled and is wanting to make sure that something is called in for him to keep him calm. He is needing it sent to [facility] on [address]

## 2023-06-30 ENCOUNTER — Telehealth: Payer: Self-pay | Admitting: Diagnostic Neuroimaging

## 2023-06-30 NOTE — Telephone Encounter (Signed)
 Pt's. wife called stating that she is needling to speak to the RN or MD regarding the Increase of agitation the pt has been having and forgetfulness of his own house and of his wife in the last 3 days and especially in the last week,  Please advise.

## 2023-07-03 NOTE — Telephone Encounter (Signed)
 Spoke to wife  Wife states pt is better. Informed wife since pt has increased confusion and agitation please call PCP to get appointment to rule out UTI or any other infections . Wife was very appreciative and will call PCP tomorrow. Wife thanked me for calling

## 2023-07-04 NOTE — Progress Notes (Signed)
 Cardiology Clinic Note   Patient Name: Bryan Hart Date of Encounter: 07/07/2023  Primary Care Provider:  [name], DO Primary Cardiologist:  Dorn Lesches, MD  Patient Profile    Bryan Hart 73 year old male presents to the clinic today for follow-up evaluation of his atrial fibrillation, PVCs, hyperlipidemia, and essential hypertension.  Past Medical History    Past Medical History:  Diagnosis Date   Arthritis    HLD (hyperlipidemia)    Hypertension    dr  ed green      stress test   12 yrs ago   PVC's (premature ventricular contractions) 12 years ago   stress test done   Sleep apnea    Stroke [facility])    minor stroke 01/2018   Past Surgical History:  Procedure Laterality Date   ANKLE ARTHROSCOPY  06/27/2008   JOINT REPLACEMENT  06/27/2009   rt shoulder rotator cuff repair   knee surgeries  [id number]   x2   SHOULDER HEMI-ARTHROPLASTY  01/12/2012   Procedure: SHOULDER HEMI-ARTHROPLASTY;  Surgeon: Eva Elsie Herring, MD;  Location: [facility] OR;  Service: Orthopedics;  Laterality: Right;   SHOULDER SURGERY Right 07/2021   TOTAL KNEE ARTHROPLASTY Right 05/31/2022   Procedure: RIGHT TOTAL KNEE ARTHROPLASTY;  Surgeon: [name] Lonni GRADE, MD;  Location: [facility] OR;  Service: Orthopedics;  Laterality: Right;    Allergies  Allergies  Allergen Reactions   Adhesive [Tape] Rash    History of Present Illness    Bryan Hart has a PMH of hyperlipidemia, PVCs, arthritis, hypertension, CVA, OSA, and atrial fibrillation with RVR.  He presented to the emergency department on 04/04/2023 and was discharged just before midnight.  He reported dizziness and fatigue.  He noted that his symptoms have been present over the past few days.  He had been dealing with dizziness however for the past several weeks.  He felt as if he may pass out and presented to his PCP for evaluation.  He was noted to be in atrial fibrillation with RVR and directed to the emergency department.  He did  note an episode of chest pain the previous day.  He reported that he had stopped his verapamil  3 to 4 weeks prior due to his dizziness and blood pressure running low.  He denied nausea, vomiting, and diarrhea.  He denied alcohol  use or significant caffeine use.  His blood pressure was noted to be 141/109.  His pulse was 85.  His EKG showed atrial fibrillation with a ventricular rate of 71.  He received IV diltiazem  and his blood pressure dropped to 80 systolic.  Diltiazem  gtt. was stopped and his blood pressure improved to the 120s.  His heart rate remained in the 70s with ambulation.  Cardiology was consulted.  It was recommended that metoprolol  50 mg twice daily be started.  He tolerated the medication well.  He remained in atrial fibrillation with controlled rate in the 90s-100s.  He was discharged and instructed to follow-up with the A-fib clinic.  He was placed on apixaban  5 mg twice daily as well.  He presented to the clinic 04/06/23 for evaluation and stated he and his wife had not been very physically active recently.  We reviewed his emergency department visit and medications.  He and his wife expressed understanding.  We reviewed triggers for atrial fibrillation.  He was not able to tolerate CPAP.  He was in the process of seeing his dentist for oral device.  His EKG  showed sinus rhythm 64  bpm rightward axis deviation.  He was tolerating his medications well.  We reviewed CHA2DS2-VASc scoring.  We reviewed upcoming visit with A-fib clinic.  I planned follow-up in 3 months.  He was seen in follow-up by the A-fib clinic on 04/12/2023.  He was maintaining sinus rhythm.  He felt that his fatigue and dizziness had improved.  He was tolerating this medication well.  He continued to wait on oral appliance from his dentist.  He presents to the clinic today for follow-up evaluation and states he is doing fairly well.  He has noticed some fatigue, decreased endurance, and lack of energy.  His blood pressure  today is noted to be 98/70.  He denies chest pain and irregular heartbeats.  He denies bleeding issues.  I will have him increase his physical activity as tolerated and decrease his metoprolol  to 37.5 mg twice daily.  Will plan follow-up in 4 to 6 months.  Today he denies chest pain, shortness of breath, lower extremity edema, fatigue, palpitations, melena, hematuria, hemoptysis, diaphoresis, weakness, presyncope, syncope, orthopnea, and PND.    Home Medications    Prior to Admission medications   Medication Sig Start Date End Date Taking? Authorizing Provider  apixaban  (ELIQUIS ) 5 MG TABS tablet Take 1 tablet (5 mg total) by mouth 2 (two) times daily. 04/04/23 05/04/23  Kingsley, Victoria K, DO  aspirin  EC 81 MG tablet Take 81 mg by mouth daily. Swallow whole.    [provider]  atorvastatin  (LIPITOR) 20 MG tablet Take 20 mg by mouth daily at 6 PM.    [provider]  methocarbamol  (ROBAXIN ) 500 MG tablet Take 1 tablet (500 mg total) by mouth every 6 (six) hours as needed for muscle spasms. 06/02/22   Gretta Bertrum ORN, PA-C  metoprolol  tartrate (LOPRESSOR ) 50 MG tablet Take 1 tablet (50 mg total) by mouth 2 (two) times daily. 04/04/23   Kingsley, Victoria K, DO  verapamil  (VERELAN  PM) 180 MG 24 hr capsule Take 180 mg by mouth daily. 07/05/22   [provider]    Family History    Family History  Problem Relation Age of Onset   Heart disease Father    Hypertension Father    Heart attack Brother    Hypertension Brother    Stroke Paternal Grandmother    Heart attack Maternal Uncle    Heart disease Brother    Colon cancer Neg Hx    He indicated that his mother is deceased. He indicated that his father is deceased. He indicated that his sister is alive. He indicated that both of his brothers are alive. He indicated that the status of his paternal grandmother is unknown. He indicated that the status of his maternal uncle is unknown. He indicated that the status of his  neg hx is unknown.  Social History    Social History   Socioeconomic History   Marital status: Married    Spouse name: [name]   Number of children: Not on file   Years of education: Not on file   Highest education level: Master's degree (e.g., MA, MS, MEng, MEd, MSW, MBA)  Occupational History   Not on file  Tobacco Use   Smoking status: Former    Current packs/day: 0.00    Average packs/day: 0.5 packs/day for 4.0 years (2.0 ttl pk-yrs)    Types: Cigarettes    Start date: 41    Quit date: 3    Years since quitting: 50.0   Smokeless tobacco: Never   Tobacco  comments:    Former smoker 04/12/23  Vaping Use   Vaping status: Never Used  Substance and Sexual Activity   Alcohol  use: Yes    Alcohol /week: 1.0 - 2.0 standard drink of alcohol     Types: 1 - 2 Cans of beer per week    Comment: daily   Drug use: No   Sexual activity: Not on file  Other Topics Concern   Not on file  Social History Narrative   Lives home with spouse [name].   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   Left handed    Social Drivers of Corporate Investment Banker Strain: Not on file  Food Insecurity: Not on file  Transportation Needs: Not on file  Physical Activity: Not on file  Stress: Not on file  Social Connections: Not on file  Intimate Partner Violence: Not on file     Review of Systems    General:  No chills, fever, night sweats or weight changes.  Cardiovascular:  No chest pain, dyspnea on exertion, edema, orthopnea, palpitations, paroxysmal nocturnal dyspnea. Dermatological: No rash, lesions/masses Respiratory: No cough, dyspnea Urologic: No hematuria, dysuria Abdominal:   No nausea, vomiting, diarrhea, bright red blood per rectum, melena, or hematemesis Neurologic:  No visual changes, wkns, changes in mental status. All other systems reviewed and are otherwise negative except as noted above.  Physical Exam    VS:  BP 98/70 (BP Location: Left Arm, Patient Position: Sitting,  Cuff Size: Normal)   Pulse 61   Ht 6' (1.829 m)   Wt 169 lb (76.7 kg)   SpO2 100%   BMI 22.92 kg/m  , BMI Body mass index is 22.92 kg/m. GEN: Well nourished, well developed, in no acute distress. HEENT: normal. Neck: Supple, no JVD, carotid bruits, or masses. Cardiac: RRR, no murmurs, rubs, or gallops. No clubbing, cyanosis, edema.  Radials/DP/PT 2+ and equal bilaterally.  Respiratory:  Respirations regular and unlabored, clear to auscultation bilaterally. GI: Soft, nontender, nondistended, BS + x 4. MS: no deformity or atrophy. Skin: warm and dry, no rash. Neuro:  Strength and sensation are intact. Psych: Normal affect.  Accessory Clinical Findings    Recent Labs: 04/04/2023: BUN 23; Creatinine, Ser 1.27; Hemoglobin 13.6; Magnesium 2.0; Platelets 252; Potassium 4.0; Sodium 141; TSH 3.056   Recent Lipid Panel No results found for: CHOL, TRIG, HDL, CHOLHDL, VLDL, LDLCALC, LDLDIRECT       ECG personally reviewed by me today-none today.    Echocardiogram 03/05/2018 ------  LV EF: 55% -   60%   -------------------------------------------------------------------  Indications:     CVA 436.   -------------------------------------------------------------------  History:  PMH:  Sleep Apnea.  Risk factors:  Hypertension.   -------------------------------------------------------------------  Study Conclusions   - Left ventricle: The cavity size was normal. Wall thickness was    normal. Systolic function was normal. The estimated ejection    fraction was in the range of 55% to 60%. Wall motion was normal;    there were no regional wall motion abnormalities. Doppler    parameters are consistent with abnormal left ventricular    relaxation (grade 1 diastolic dysfunction).  - Aortic valve: There was no stenosis. There was trivial    regurgitation.  - Aorta: Mildly dilated aortic root.  - Mitral valve: Mildly calcified annulus. There was trivial    regurgitation.  -  Left atrium: The atrium was mildly dilated.  - Right ventricle: The cavity size was normal. Systolic function    was normal.  -  Tricuspid valve: Peak RV-RA gradient (S): 29 mm Hg.  - Pulmonary arteries: PA peak pressure: 32 mm Hg (S).  - Inferior vena cava: The vessel was normal in size. The    respirophasic diameter changes were in the normal range (>= 50%),    consistent with normal central venous pressure.  - Pericardium, extracardiac: Pleural effusion noted.   Impressions:   - Normal LV size and systolic function, EF 55-60%. Normal RV size    and systolic function. Borderline pulmonary hypertension.   -------------------------------------------------------------------  Study data:  No prior study was available for comparison.  Study  status:  Routine.  Procedure:  The patient reported no pain pre or  post test. Transthoracic echocardiography. Image quality was  adequate.  Study completion:  There were no complications.  Transthoracic echocardiography.  M-mode, complete 2D, spectral  Doppler, and color Doppler.  Birthdate:  Patient birthdate:  06-07-1951.  Age:  Patient is 73 yr old.  Sex:  Gender: male.  BMI: 26.6 kg/m^2.  Blood pressure:     129/72  Patient status:  Outpatient.  Study date:  Study date: 03/05/2018. Study time: 03:09  PM.  Location:  Echo laboratory.   -------------------------------------------------------------------   -------------------------------------------------------------------  Left ventricle:  The cavity size was normal. Wall thickness was  normal. Systolic function was normal. The estimated ejection  fraction was in the range of 55% to 60%. Wall motion was normal;  there were no regional wall motion abnormalities. Doppler  parameters are consistent with abnormal left ventricular relaxation  (grade 1 diastolic dysfunction).   -------------------------------------------------------------------  Aortic valve:   Trileaflet.  Doppler:   There was no  stenosis.  There was trivial regurgitation.   -------------------------------------------------------------------  Aorta: Mildly dilated aortic root. Ascending aorta: The ascending  aorta was normal in size.   -------------------------------------------------------------------  Mitral valve:   Mildly calcified annulus.  Doppler:   There was no  evidence for stenosis.   There was trivial regurgitation.    Valve  area by pressure half-time: 2.89 cm^2. Indexed valve area by  pressure half-time: 1.35 cm^2/m^2.    Peak gradient (D): 3 mm Hg.    -------------------------------------------------------------------  Left atrium:  The atrium was mildly dilated.   -------------------------------------------------------------------  Right ventricle:  The cavity size was normal. Systolic function was  normal.   -------------------------------------------------------------------  Pulmonic valve:    Structurally normal valve.   Cusp separation was  normal.  Doppler:  Transvalvular velocity was within the normal  range. There was mild regurgitation.   -------------------------------------------------------------------  Tricuspid valve:   Doppler:  There was trivial regurgitation.    -------------------------------------------------------------------  Right atrium:  The atrium was normal in size.   -------------------------------------------------------------------  Pericardium: Pleural effusion noted. There was no pericardial  effusion.   -------------------------------------------------------------------  Systemic veins:  Inferior vena cava: The vessel was normal in size. The  respirophasic diameter changes were in the normal range (>= 50%),  consistent with normal central venous pressure.   -------------------------------------------------------------------  Post procedure conclusions  Ascending Aorta:   - Mildly dilated aortic root.       Assessment & Plan   1.  Atrial  fibrillation-heart rate today 61.  Continues to be compliant with apixaban .  Denies bleeding issues.  This patients CHA2DS2-VASc Score and unadjusted Ischemic Stroke Rate (% per year) is equal to 7.2 % stroke rate/year from a score of 4 Above score calculated as 1 point each if present [ HTN, Age if 33-74,  2 p-Stroke/TIA/TE] Continue metoprolol , apixaban  Avoid triggers  caffeine, chocolate, EtOH, dehydration etc. Also following with A-fib clinic  OSA-unable to tolerate CPAP.  Continues to work on designer, industrial/product with dentist. Avoid supine sleeping, alcohol , sedatives-reviewed Sleep hygiene instructions given Follows with Dr. Shlomo  Hyperlipidemia-on statin therapy High-fiber diet Continue aspirin , atorvastatin  Increase physical activity as tolerated Following with PCP  Essential hypertension-BP today 98/70 Maintain blood pressure log Reduce metoprolol  to 37.5 mg twice daily  History of CVA-no neurological deficits. Continue aspirin , atorvastatin  Follows with PCP  Fatigue-recently notices increased fatigue and activity intolerance with decreased endurance. Decrease metoprolol  to 37.5 mg twice daily  Disposition: Follow-up with Dr. Court or me in 4-6 months.    Josefa HERO. Tarin Navarez NP-C     07/07/2023, 11:15 AM [facility] [address] Office [phone] Fax [phone]    I spent 14 minutes examining this patient, reviewing medications, and using patient centered shared decision making involving her cardiac care.  Prior to her visit I spent greater than 20 minutes reviewing her past medical history,  medications, and prior cardiac tests.

## 2023-07-05 DIAGNOSIS — Z125 Encounter for screening for malignant neoplasm of prostate: Secondary | ICD-10-CM | POA: Diagnosis not present

## 2023-07-05 DIAGNOSIS — R5383 Other fatigue: Secondary | ICD-10-CM | POA: Diagnosis not present

## 2023-07-05 DIAGNOSIS — R634 Abnormal weight loss: Secondary | ICD-10-CM | POA: Diagnosis not present

## 2023-07-05 DIAGNOSIS — I10 Essential (primary) hypertension: Secondary | ICD-10-CM | POA: Diagnosis not present

## 2023-07-05 DIAGNOSIS — E785 Hyperlipidemia, unspecified: Secondary | ICD-10-CM | POA: Diagnosis not present

## 2023-07-07 ENCOUNTER — Ambulatory Visit: Payer: PPO | Attending: General Practice | Admitting: General Practice

## 2023-07-07 ENCOUNTER — Encounter: Payer: Self-pay | Admitting: General Practice

## 2023-07-07 VITALS — BP 98/70 | HR 61 | Ht 72.0 in | Wt 169.0 lb

## 2023-07-07 DIAGNOSIS — I10 Essential (primary) hypertension: Secondary | ICD-10-CM

## 2023-07-07 DIAGNOSIS — G4733 Obstructive sleep apnea (adult) (pediatric): Secondary | ICD-10-CM | POA: Diagnosis not present

## 2023-07-07 DIAGNOSIS — I639 Cerebral infarction, unspecified: Secondary | ICD-10-CM | POA: Diagnosis not present

## 2023-07-07 DIAGNOSIS — I480 Paroxysmal atrial fibrillation: Secondary | ICD-10-CM

## 2023-07-07 DIAGNOSIS — E782 Mixed hyperlipidemia: Secondary | ICD-10-CM

## 2023-07-07 MED ORDER — METOPROLOL TARTRATE 37.5 MG PO TABS: 37.5000 mg | ORAL_TABLET | Freq: Two times a day (BID) | ORAL | 3 refills | Status: DC

## 2023-07-07 NOTE — Patient Instructions (Signed)
 Medication Instructions:  Your physician has recommended you make the following change in your medication:   -Decrease metoprolol  tartrate (lopressor ) to 37.5mg  twice daily.  *If you need a refill on your cardiac medications before your next appointment, please call your pharmacy*    Follow-Up: At [facility], you and your health needs are our priority.  As part of our continuing mission to provide you with exceptional heart care, we have created designated Provider Care Teams.  These Care Teams include your primary Cardiologist (physician) and Advanced Practice Providers (APPs -  Physician Assistants and Nurse Practitioners) who all work together to provide you with the care you need, when you need it.  We recommend signing up for the patient portal called MyChart.  Sign up information is provided on this After Visit Summary.  MyChart is used to connect with patients for Virtual Visits (Telemedicine).  Patients are able to view lab/test results, encounter notes, upcoming appointments, etc.  Non-urgent messages can be sent to your provider as well.   To learn more about what you can do with MyChart, go to [email or web address].    Your next appointment:   4-6 month(s)  Provider:   Dorn Lesches, MD  or Josefa Beauvais, FNP     Other Instructions Increase physical activity as tolerated.

## 2023-07-12 DIAGNOSIS — R413 Other amnesia: Secondary | ICD-10-CM | POA: Diagnosis not present

## 2023-07-12 DIAGNOSIS — F325 Major depressive disorder, single episode, in full remission: Secondary | ICD-10-CM | POA: Diagnosis not present

## 2023-07-12 DIAGNOSIS — E785 Hyperlipidemia, unspecified: Secondary | ICD-10-CM | POA: Diagnosis not present

## 2023-07-12 DIAGNOSIS — Z0000 Encounter for general adult medical examination without abnormal findings: Secondary | ICD-10-CM | POA: Diagnosis not present

## 2023-07-12 DIAGNOSIS — Z8673 Personal history of transient ischemic attack (TIA), and cerebral infarction without residual deficits: Secondary | ICD-10-CM | POA: Diagnosis not present

## 2023-07-12 DIAGNOSIS — I10 Essential (primary) hypertension: Secondary | ICD-10-CM | POA: Diagnosis not present

## 2023-07-12 DIAGNOSIS — Z23 Encounter for immunization: Secondary | ICD-10-CM | POA: Diagnosis not present

## 2023-07-12 DIAGNOSIS — Z1331 Encounter for screening for depression: Secondary | ICD-10-CM | POA: Diagnosis not present

## 2023-07-12 DIAGNOSIS — Z1339 Encounter for screening examination for other mental health and behavioral disorders: Secondary | ICD-10-CM | POA: Diagnosis not present

## 2023-07-12 DIAGNOSIS — R251 Tremor, unspecified: Secondary | ICD-10-CM | POA: Diagnosis not present

## 2023-07-26 ENCOUNTER — Ambulatory Visit
Admission: RE | Admit: 2023-07-26 | Discharge: 2023-07-26 | Disposition: A | Discharge status: Home / Self Care | Payer: PPO | Source: Ambulatory Visit | Attending: Diagnostic Neuroimaging | Admitting: Diagnostic Neuroimaging

## 2023-07-26 DIAGNOSIS — F03A18 Unspecified dementia, mild, with other behavioral disturbance: Secondary | ICD-10-CM

## 2023-07-26 DIAGNOSIS — R413 Other amnesia: Secondary | ICD-10-CM | POA: Diagnosis not present

## 2023-07-26 MED ORDER — GADOPICLENOL 0.5 MMOL/ML IV SOLN
8.0000 mL | Freq: Once | INTRAVENOUS | Status: AC | PRN
  Administered 2023-07-26: 8 mL via INTRAVENOUS

## 2023-07-31 NOTE — Progress Notes (Signed)
Mild atrophy and chronic small vessel ischemic disease.  No other major findings.  Continue current plan. -VRP

## 2023-08-03 DIAGNOSIS — L821 Other seborrheic keratosis: Secondary | ICD-10-CM | POA: Diagnosis not present

## 2023-08-03 DIAGNOSIS — L218 Other seborrheic dermatitis: Secondary | ICD-10-CM | POA: Diagnosis not present

## 2023-08-03 DIAGNOSIS — L820 Inflamed seborrheic keratosis: Secondary | ICD-10-CM | POA: Diagnosis not present

## 2023-08-22 ENCOUNTER — Ambulatory Visit: Payer: PPO | Admitting: Podiatry

## 2023-08-22 ENCOUNTER — Encounter: Payer: Self-pay | Admitting: Podiatry

## 2023-08-22 DIAGNOSIS — B351 Tinea unguium: Secondary | ICD-10-CM | POA: Diagnosis not present

## 2023-08-22 DIAGNOSIS — M79675 Pain in left toe(s): Secondary | ICD-10-CM

## 2023-08-22 DIAGNOSIS — M79674 Pain in right toe(s): Secondary | ICD-10-CM

## 2023-08-22 MED ORDER — CICLOPIROX 8 % EX SOLN: Freq: Every day | CUTANEOUS | 2 refills | Status: DC

## 2023-08-23 ENCOUNTER — Telehealth: Payer: Self-pay

## 2023-08-23 ENCOUNTER — Telehealth: Payer: Self-pay | Admitting: Diagnostic Neuroimaging

## 2023-08-23 NOTE — Progress Notes (Signed)
 Subjective: Chief Complaint  Patient presents with   RFC    RM#13 RFC also wife states ins did not cover fungal medication.    73 year old male presents after the above concerns.  States his nails get long they are causing pain.  Did not start the oral medication but it was not covered by insurance and is prior authorization denies any swelling redness or any drainage.  No open lesions.  He has no other concerns today.   Objective: AAO x3, NAD DP/PT pulses palpable bilaterally, CRT less than 3 seconds Nails are hypertrophic, dystrophic, brittle, discolored, elongated 10. No surrounding redness or drainage. Tenderness nails 1-5 bilaterally. No open lesions or pre-ulcerative lesions are identified today. No pain with calf compression, swelling, warmth, erythema  Assessment: Symptomatic onychomycosis  Plan: -All treatment options discussed with the patient including all alternatives, risks, complications.  -Sharply debrided nails x 10 without any complications or bleeding.  Discussed treatment options for nail fungus. Prescribed Penlac, discussed side effects, duration of use, success rates.  Both like to pursue topical medication given the side effect profile of the oral Lamisil -Patient encouraged to call the office with any questions, concerns, change in symptoms.   Vivi Barrack DPM

## 2023-08-23 NOTE — Telephone Encounter (Signed)
 Spouse is asking for a call to discuss concerns that things are more intense re: pt believing they have 2 homes, former clients(students) are in the home or are coming for dinner. Spouse states there are times he remembers her as his wife and other times he thinks she is someone else, and ask for her help in contacting his wife.  Spouse states there are good days (like today for example, he has drove himself to the gym and is currently there) and then not so good days when he has to ask her to help him contact his wife on the phone.  Please call spouse at [phone], she said she is unable to my chart because they have a code set up which will send pt notification of messaging to and or from provider.  When calling back spouse states if pt is near she will not take call but leave message and she will call back.

## 2023-08-23 NOTE — Telephone Encounter (Signed)
 Called the patient back. There was no answer. LVM for a call back.   Per notes there seems to be normal progression of the memory concerns and back in January wife had called with concerns as well,   If wife calls back,please see if there is any more information that she would like to discuss. Per Dr Richrd Humbles notes the blood work completed 06/12/2023 was not indicative of Alzheimer's although there was some abnormality present. We could continue with current plan and supportive care. There was discussion of potentially adding a medication memantine. Is she interested in him starting the medication to see if there is benefit. Dr Marjory Lies as recommended no driving in last office note. With the concerns she is having and the memory score it would not be a good idea for Bryan Hart to continue driving.

## 2023-08-23 NOTE — Telephone Encounter (Signed)
 PA request received from covermymeds for ciclopirox 8% solution. PA submitted.  Bryan Hart ([employer]) Rx #: [phone]

## 2023-08-24 NOTE — Telephone Encounter (Signed)
 Pt switching back and forth on knowing me and not knowing me. Some time we have good days where he knows me and we can  do bills together and move through the day. Sometimes he may think the person he is with is me and he worries about me, he has a lot of anxiety.

## 2023-08-24 NOTE — Telephone Encounter (Addendum)
 I called patient wife Elease Hashimoto and read Baird Lyons note, asked about if taking memantine would be an option? Wife said she will speak with pt about the medication to see if he is willing to try.   Wife said she lets patient drive to [facility] which is just down the street from their home. Otherwise wife has been driving. Wife said pt gets very frustrated and a angry, but is not abusive to her. Pt doesn't recognize wife all the time. I did explain that Dr.[name] recommended pt not to drive. Pt wife will follow up if pt wants to try memantine.

## 2023-08-24 NOTE — Telephone Encounter (Signed)
 I called pt wife and told her Dr.[name] also said " Can try sertraline 25mg  daily for anxiety. -VRP "  Pt said she will discuss with husband and let the office know if they wish to proceed with sertraline.

## 2023-08-25 NOTE — Telephone Encounter (Signed)
 PA was denied.I did place the additional information you gave me on the PA.

## 2023-08-28 ENCOUNTER — Ambulatory Visit: Payer: PPO | Admitting: Diagnostic Neuroimaging

## 2023-08-30 ENCOUNTER — Other Ambulatory Visit: Payer: Self-pay | Admitting: Podiatry

## 2023-08-30 DIAGNOSIS — L570 Actinic keratosis: Secondary | ICD-10-CM | POA: Diagnosis not present

## 2023-08-30 DIAGNOSIS — D1801 Hemangioma of skin and subcutaneous tissue: Secondary | ICD-10-CM | POA: Diagnosis not present

## 2023-08-30 DIAGNOSIS — L918 Other hypertrophic disorders of the skin: Secondary | ICD-10-CM | POA: Diagnosis not present

## 2023-08-30 DIAGNOSIS — D225 Melanocytic nevi of trunk: Secondary | ICD-10-CM | POA: Diagnosis not present

## 2023-08-30 DIAGNOSIS — M720 Palmar fascial fibromatosis [Dupuytren]: Secondary | ICD-10-CM | POA: Diagnosis not present

## 2023-08-30 DIAGNOSIS — D224 Melanocytic nevi of scalp and neck: Secondary | ICD-10-CM | POA: Diagnosis not present

## 2023-08-30 DIAGNOSIS — L814 Other melanin hyperpigmentation: Secondary | ICD-10-CM | POA: Diagnosis not present

## 2023-08-30 DIAGNOSIS — L821 Other seborrheic keratosis: Secondary | ICD-10-CM | POA: Diagnosis not present

## 2023-08-30 DIAGNOSIS — L218 Other seborrheic dermatitis: Secondary | ICD-10-CM | POA: Diagnosis not present

## 2023-08-30 DIAGNOSIS — B351 Tinea unguium: Secondary | ICD-10-CM | POA: Diagnosis not present

## 2023-08-30 MED ORDER — CICLOPIROX 0.77 % EX GEL: 1.0000 | Freq: Two times a day (BID) | CUTANEOUS | 2 refills | Status: DC

## 2023-09-05 DIAGNOSIS — R1312 Dysphagia, oropharyngeal phase: Secondary | ICD-10-CM | POA: Diagnosis not present

## 2023-09-05 DIAGNOSIS — R499 Unspecified voice and resonance disorder: Secondary | ICD-10-CM | POA: Diagnosis not present

## 2023-09-11 MED ORDER — MEMANTINE HCL 28 X 5 MG & 21 X 10 MG PO TABS: ORAL_TABLET | ORAL | 0 refills | Status: DC

## 2023-09-11 NOTE — Telephone Encounter (Signed)
 Dr. Marjory Lies in your last note you stated: - consider memantine 10mg  at bedtime; increase to twice a day after 1-2 weeks   Is it still ok to add since pt now interested in starting the medication?

## 2023-09-11 NOTE — Telephone Encounter (Signed)
 Called the wife back to let her know that we will send the script into the pharmacy. I advised the memantine titration starter pack and informed her of the most common side effects gi upset and diarrhea. I advised I will not put refills on the titration pack and asked that as he is close to running out, to call us and let us know he is tolerating well. She verbalized understanding

## 2023-09-11 NOTE — Addendum Note (Signed)
 Addended by: Judi Cong on: 09/11/2023 03:08 PM   Modules accepted: Orders

## 2023-09-11 NOTE — Telephone Encounter (Addendum)
 Wife is calling saying pt would now like to try Memantine, wife explained this is something Dr Sheila Oats once suggested.  Wife is asking that it be called into [facility] [phone].  Wife asking to be called on cell#[phone] if RN needs to call and discuss this request.

## 2023-09-22 IMAGING — MR MR SHOULDER*L* W/O CM
4 of 5 series · 21 of 40 positions shown · non-contrast
Comparison: X-ray shoulder 05/26/2021.

CLINICAL DATA: Left shoulder pain with decreased range of motion
related to a fall 6 weeks ago.

EXAM:
MRI OF THE LEFT SHOULDER WITHOUT CONTRAST
TECHNIQUE: Multiplanar, multisequence MR imaging of the shoulder was performed.
No intravenous contrast was administered.

[Series 6: T2 fat-sat · axial · left · 3.0mm · 0.48mm/px · z∈[-67,+46]mm · 8 of 30 slices shown (1 of 3)]
[im 1/30]
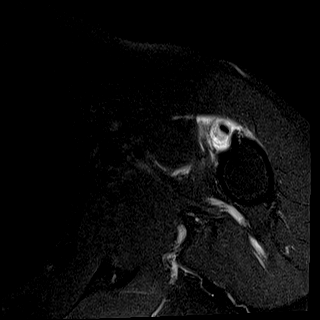
[im 4/30]
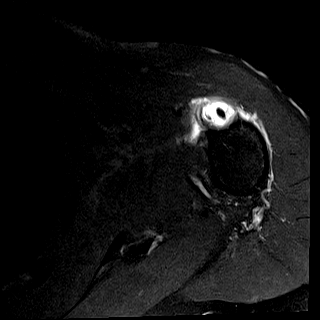
[im 10/30]
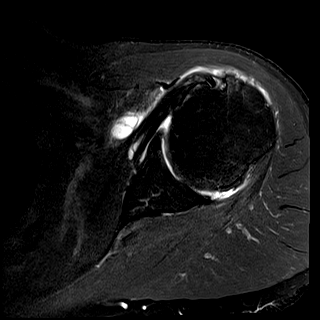
[im 13/30]
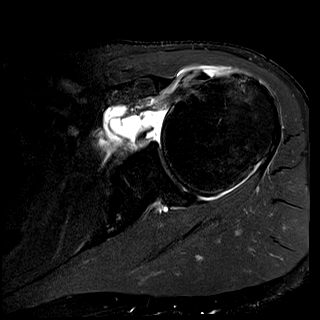
[im 17/30]
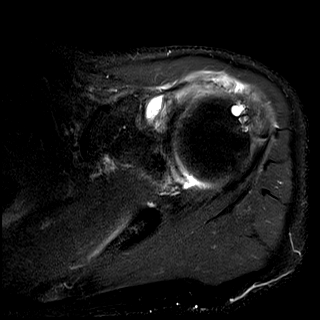
[im 20/30]
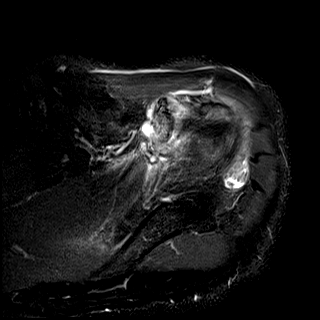
[im 26/30]
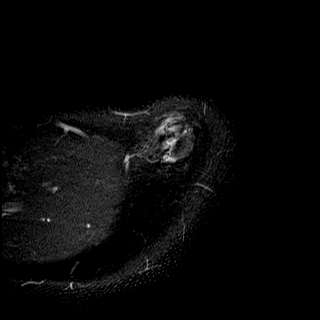
[im 30/30]
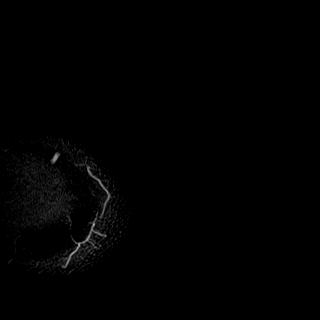

[Series 7: T2 fat-sat · oblique · left · 4.0mm · 0.22mm/px · 3 of 21 slices shown (2 of 3)]
[im 4/21]
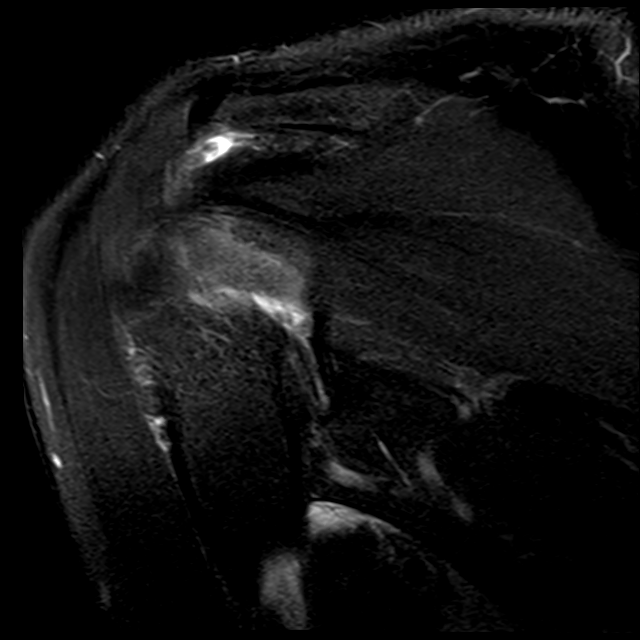
[im 11/21]
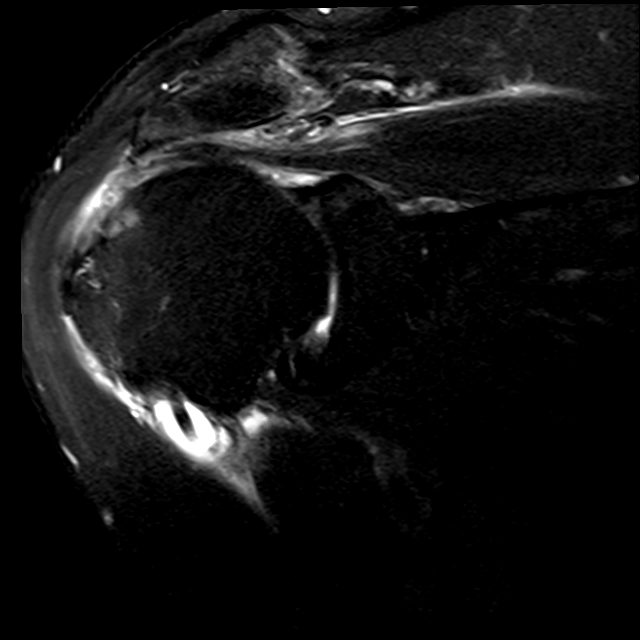
[im 17/21]
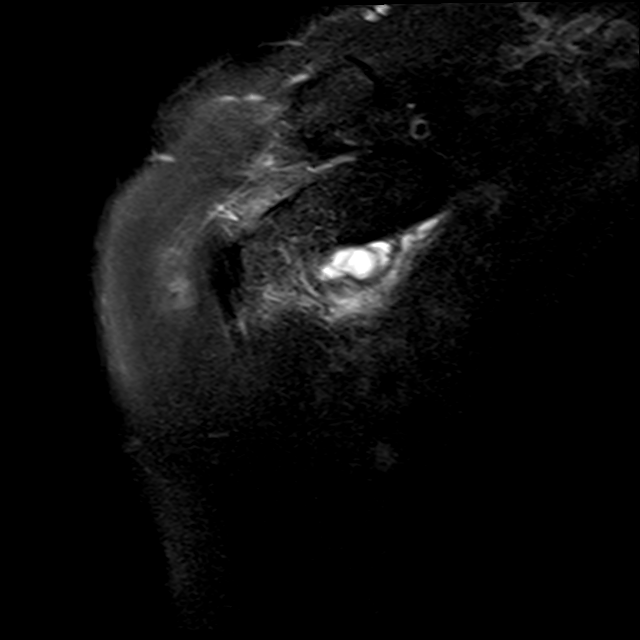

[Series 8: PD · oblique · left · 4.0mm · 0.22mm/px · 7 of 21 slices shown]
[im 1/21]
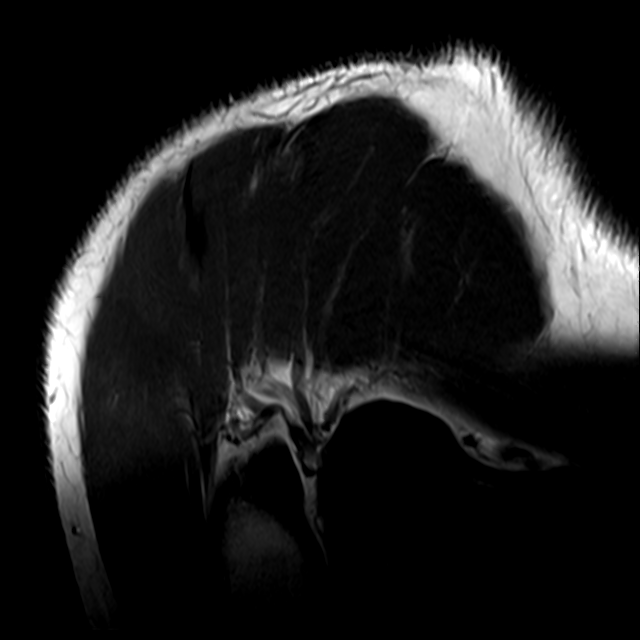
[im 4/21]
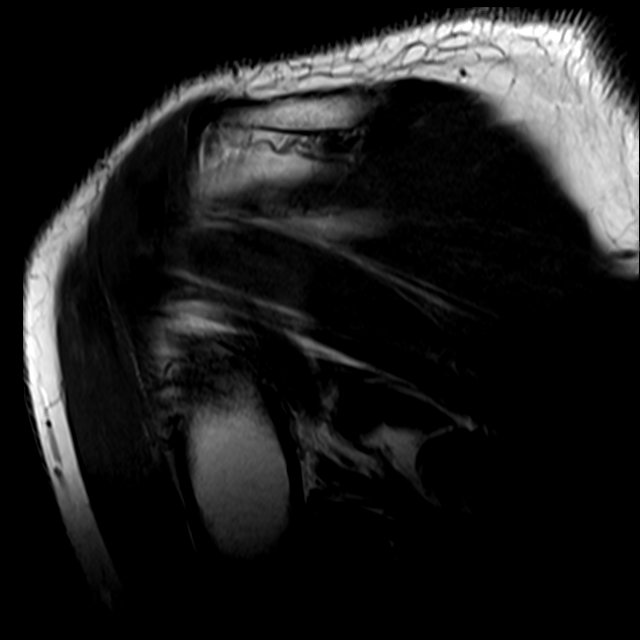
[im 7/21]
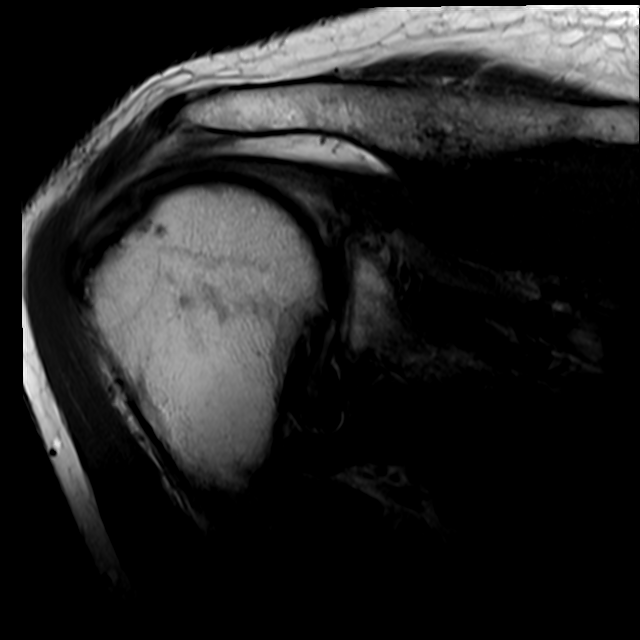
[im 11/21]
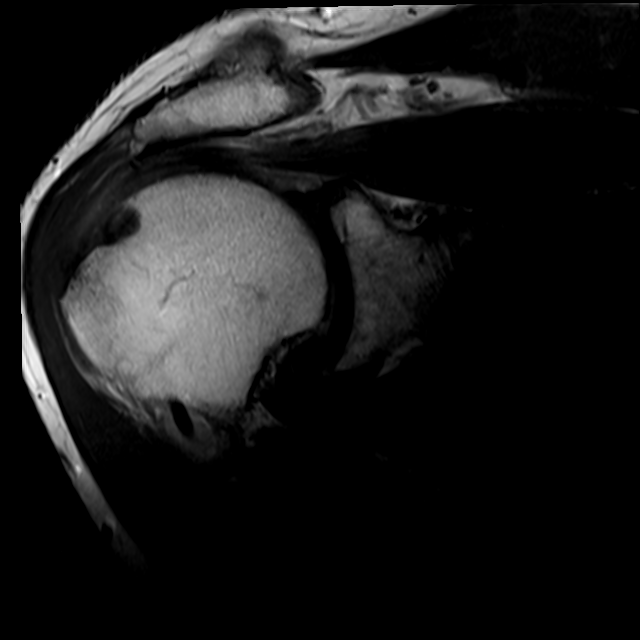
[im 14/21]
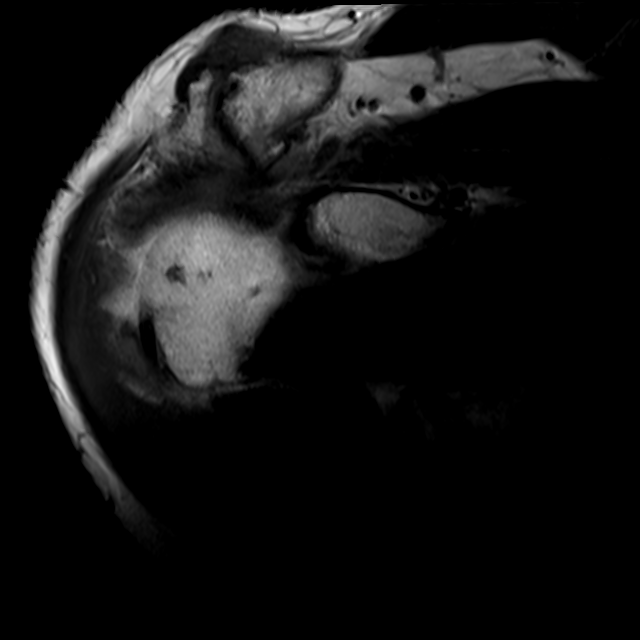
[im 17/21]
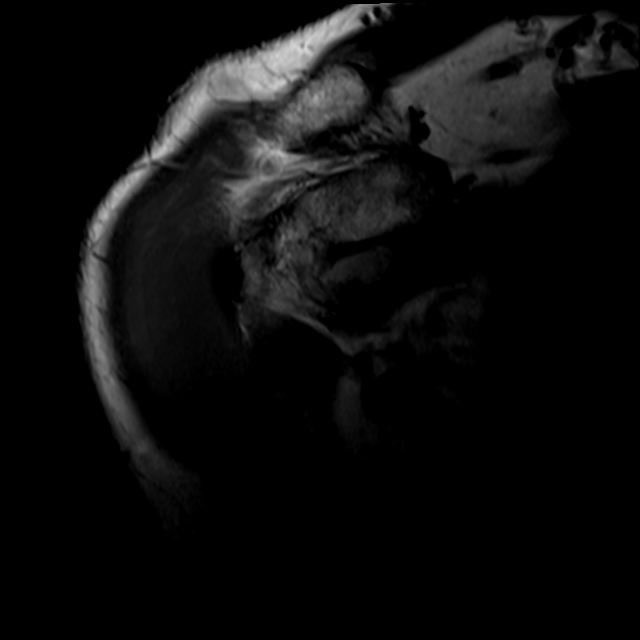
[im 21/21]
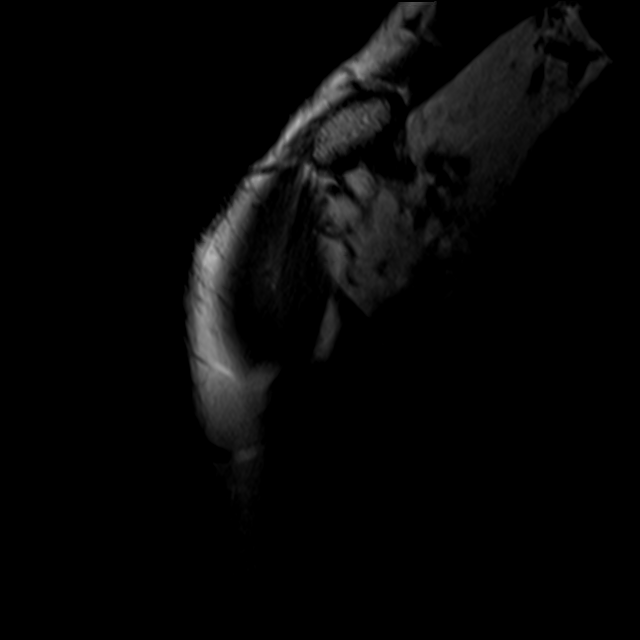

[Series 9: T2 fat-sat · oblique · left · 4.0mm · 0.44mm/px · 3 of 23 slices shown (3 of 3)]
[im 4/23]
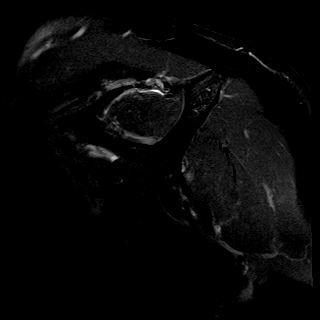
[im 13/23]
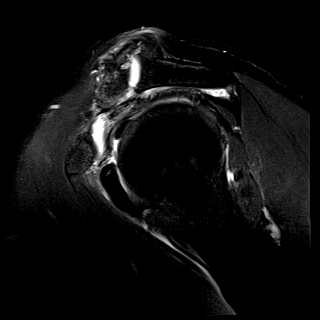
[im 19/23]
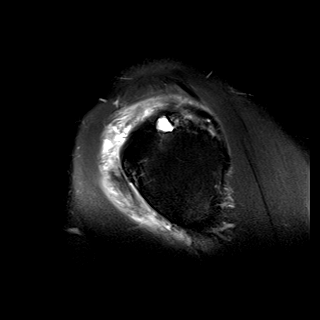

[21 of 40 positions shown; findings below may reference images not displayed]

FINDINGS: Rotator cuff: Full-thickness tear involving the anterior
supraspinatus tendon insertion with up to 13 mm of tendon
retraction. Tear measures approximately 12 mm in AP dimension.
Advanced tendinosis of the supraspinatus, infraspinatus, and
subscapularis tendons.

Muscles: Mild supraspinatus intramuscular edema. Preserved bulk of
the rotator cuff musculature without atrophy or fatty infiltration.

Biceps long head: Severe intra-articular biceps tendinosis with
moderate tenosynovitis.

Acromioclavicular Joint: Severe degenerative changes of the AC
joint. Small volume subacromial-subdeltoid bursal fluid
communicating with the glenohumeral joint.

Glenohumeral Joint: Trace glenohumeral joint effusion. Mild
cartilage thinning, most pronounced along the superior aspect of the
humeral head.

Labrum:  Superior labrum appears degenerated.

Bones: No acute fracture. No dislocation. No bone marrow edema. No
marrow replacing bone lesion.

Other: None.
IMPRESSION: 1. Rotator cuff tendinosis with a full-thickness mildly retracted
supraspinatus tendon tear.
2. Severe intra-articular biceps tendinosis with moderate
tenosynovitis.
3. Severe AC joint and mild glenohumeral osteoarthritis.

## 2023-10-06 ENCOUNTER — Other Ambulatory Visit: Payer: Self-pay | Admitting: Diagnostic Neuroimaging

## 2023-10-11 ENCOUNTER — Ambulatory Visit (HOSPITAL_COMMUNITY): Payer: PPO | Admitting: Internal Medicine

## 2023-10-11 ENCOUNTER — Telehealth: Payer: Self-pay

## 2023-10-11 MED ORDER — MEMANTINE HCL 10 MG PO TABS: 10.0000 mg | ORAL_TABLET | Freq: Two times a day (BID) | ORAL | 1 refills | Status: DC

## 2023-10-11 NOTE — Telephone Encounter (Signed)
"  Pt's wife is asking if Dr Salli Crawley has concerns about pt starting the therapeutic dose of this memantine before it is called in, please call to discuss."    Also had received a call from the patients wife documented on the refill request about concerns in regard to the maintenance dose. LVM asking for a call back  **If wife calls back please ask what dose has the patient done well with. We can send a refill in but just need to know if he is doing well on 10 mg twice a day or if they prefer to stay at a lower dose.

## 2023-10-11 NOTE — Telephone Encounter (Signed)
 Pt's wife called in stating that the pt is doing very well on the dosage he is on for the Memantine 10mg  BID so he would like to stay as is.

## 2023-10-11 NOTE — Telephone Encounter (Signed)
 Received a call from [id number] - [place], [place] - [address] AT [address] [facility] [address]  They were checking to see if we received a refill request for patients Memantine. They can be reached back at [phone]

## 2023-10-11 NOTE — Telephone Encounter (Signed)
Refill sent in for the patient 

## 2023-10-11 NOTE — Telephone Encounter (Signed)
 Pt's wife is asking if Dr Salli Crawley has concerns about pt starting the therapeutic dose of this memantine before it is called in, please call to discuss.

## 2023-10-12 ENCOUNTER — Ambulatory Visit (HOSPITAL_COMMUNITY)
Admission: RE | Admit: 2023-10-12 | Discharge: 2023-10-12 | Disposition: A | Discharge status: Home / Self Care | Payer: PPO | Source: Ambulatory Visit | Attending: Internal Medicine | Admitting: Internal Medicine

## 2023-10-12 ENCOUNTER — Encounter (HOSPITAL_COMMUNITY): Payer: Self-pay | Admitting: Physician Assistant

## 2023-10-12 VITALS — BP 104/80 | HR 64 | Ht 72.0 in | Wt 168.8 lb

## 2023-10-12 DIAGNOSIS — I493 Ventricular premature depolarization: Secondary | ICD-10-CM | POA: Insufficient documentation

## 2023-10-12 DIAGNOSIS — G4733 Obstructive sleep apnea (adult) (pediatric): Secondary | ICD-10-CM | POA: Diagnosis not present

## 2023-10-12 DIAGNOSIS — I10 Essential (primary) hypertension: Secondary | ICD-10-CM | POA: Diagnosis not present

## 2023-10-12 DIAGNOSIS — Z8673 Personal history of transient ischemic attack (TIA), and cerebral infarction without residual deficits: Secondary | ICD-10-CM | POA: Insufficient documentation

## 2023-10-12 DIAGNOSIS — I4891 Unspecified atrial fibrillation: Secondary | ICD-10-CM | POA: Diagnosis present

## 2023-10-12 DIAGNOSIS — I480 Paroxysmal atrial fibrillation: Secondary | ICD-10-CM | POA: Insufficient documentation

## 2023-10-12 DIAGNOSIS — D6869 Other thrombophilia: Secondary | ICD-10-CM | POA: Diagnosis not present

## 2023-10-12 DIAGNOSIS — Z7901 Long term (current) use of anticoagulants: Secondary | ICD-10-CM | POA: Insufficient documentation

## 2023-10-12 NOTE — Progress Notes (Signed)
 Primary Care Physician: [name], DO Primary Cardiologist: Nanetta Batty, MD Electrophysiologist: None  Referring Physician: [facility]   Bryan Hart is a 73 y.o. male with a history of PVCs, HTN, CVA, OSA, atrial fibrillation who presents for follow up in the [facility]. Patient was seen by his PCP 04/04/23 for dizziness and fatiuge. ECG showed afib with RVR and he was directed to the ED. He received IV diltiazem and his blood pressure dropped to 80 systolic. Diltiazem gtt. was stopped and his blood pressure improved to the 120s. His heart rate remained in the 70s with ambulation. Cardiology was consulted. It was recommended that metoprolol 50 mg twice daily be started. He tolerated the medication well. He remained in atrial fibrillation with controlled rate in the 90s-100s. He was also started on Eliquis for stroke prevention. He spontaneously converted after discharge.   Patient returns for follow up for atrial fibrillation. Patient reports that he has done well since his last visit. He denies any interim symptoms of afib. He does have occasional lightheadedness when standing. No presyncope or falls. No bleeding issues on anticoagulation.   Today, he  denies symptoms of palpitations, chest pain, shortness of breath, orthopnea, PND, lower extremity edema, presyncope, syncope, bleeding, or neurologic sequela. The patient is tolerating medications without difficulties and is otherwise without complaint today.    Atrial Fibrillation Risk Factors:  he does have symptoms or diagnosis of sleep apnea. he does not have a history of rheumatic fever.   Atrial Fibrillation Management history:  Previous antiarrhythmic drugs: none Previous cardioversions: none Previous ablations: none Anticoagulation history: Eliquis  ROS- All systems are reviewed and negative except as per the HPI above.  Past Medical History:  Diagnosis Date   Arthritis    HLD  (hyperlipidemia)    Hypertension    dr  ed green      stress test   12 yrs ago   PVC's (premature ventricular contractions) 12 years ago   stress test done   Sleep apnea    Stroke [facility])    minor stroke 01/2018    Current Outpatient Medications  Medication Sig Dispense Refill   apixaban (ELIQUIS) 5 MG TABS tablet Take 1 tablet (5 mg total) by mouth 2 (two) times daily. 60 tablet 5   atorvastatin (LIPITOR) 20 MG tablet Take 20 mg by mouth daily at 6 PM.     ciclopirox (PENLAC) 8 % solution Apply topically at bedtime. Apply over nail and surrounding skin. Apply daily over previous coat. After seven (7) days, may remove with alcohol and continue cycle. 6.6 mL 2   Ciclopirox 0.77 % gel Apply 1 Application topically 2 (two) times daily. 45 g 2   memantine (NAMENDA TITRATION PACK) tablet pack 5 mg/day for =1 week; 5 mg twice daily for =1 week; 15 mg/day given in 5 mg and 10 mg separated doses for =1 week; then 10 mg twice daily 49 tablet 0   memantine (NAMENDA) 10 MG tablet Take 1 tablet (10 mg total) by mouth 2 (two) times daily. 180 tablet 1   methocarbamol (ROBAXIN) 500 MG tablet Take 1 tablet (500 mg total) by mouth every 6 (six) hours as needed for muscle spasms. 40 tablet 1   metoprolol tartrate 37.5 MG TABS Take 1 tablet (37.5 mg total) by mouth 2 (two) times daily. 180 tablet 3   No current facility-administered medications for this encounter.    Physical Exam: BP 104/80   Pulse  64   Ht 6' (1.829 m)   Wt 76.6 kg   BMI 22.89 kg/m   GEN: Well nourished, well developed in no acute distress CARDIAC: Regular rate and rhythm, no murmurs, rubs, gallops RESPIRATORY:  Clear to auscultation without rales, wheezing or rhonchi  ABDOMEN: Soft, non-tender, non-distended EXTREMITIES:  No edema; No deformity    Wt Readings from Last 3 Encounters:  10/12/23 76.6 kg  07/07/23 76.7 kg  06/12/23 80.6 kg     EKG today demonstrates  SR Vent. rate 64 BPM PR interval 166 ms QRS duration 94  ms QT/QTcB 414/427 ms   Echo 05/12/23 demonstrated   1. Left ventricular ejection fraction, by estimation, is 60 to 65%. The  left ventricle has normal function. The left ventricle has no regional  wall motion abnormalities. Left ventricular diastolic parameters were  normal. The average left ventricular global longitudinal strain is -16.6 %. The global longitudinal strain is normal.   2. Right ventricular systolic function is normal. The right ventricular  size is normal.   3. The mitral valve is abnormal. No evidence of mitral valve  regurgitation. No evidence of mitral stenosis.   4. The aortic valve is tricuspid. There is moderate calcification of the  aortic valve. There is moderate thickening of the aortic valve. Aortic  valve regurgitation is trivial. Aortic valve sclerosis is present, with no  evidence of aortic valve stenosis.   5. Aortic dilatation noted. There is mild dilatation of the aortic root,  measuring 39 mm.   6. The inferior vena cava is normal in size with greater than 50%  respiratory variability, suggesting right atrial pressure of 3 mmHg.   Comparison(s): EF 55%, trivial AI, mild MAC, AOR 39 mm.     CHA2DS2-VASc Score = 4  The patient's score is based upon: CHF History: 0 HTN History: 1 Diabetes History: 0 Stroke History: 2 Vascular Disease History: 0 Age Score: 1 Gender Score: 0       ASSESSMENT AND PLAN: Paroxysmal Atrial Fibrillation (ICD10:  I48.0) The patient's CHA2DS2-VASc score is 4, indicating a 4.8% annual risk of stroke.   Patient appears to be maintaining SR Continue Lopressor 37.5 mg BID. We discussed decreasing this medication to 25 mg, he would prefer to continue the present dose for now.  Continue Eliquis 5 mg BID  Secondary Hypercoagulable State (ICD10:  D68.69) The patient is at significant risk for stroke/thromboembolism based upon his CHA2DS2-VASc Score of 4.  Continue Apixaban (Eliquis). No bleeding issues.   OSA  He did not  tolerate CPAP  HTN Stable on current regimen    Follow up with Dr Katheryne Pane as scheduled.        Myrtha Ates PA-C [facility] [address] [place], [place] [address] [phone]

## 2023-10-13 ENCOUNTER — Telehealth (HOSPITAL_COMMUNITY): Payer: Self-pay | Admitting: *Deleted

## 2023-10-13 NOTE — Telephone Encounter (Signed)
 Attempted to contact patient to schedule OP MBS. Left VM @ [phone]. RKEEL

## 2023-10-16 ENCOUNTER — Other Ambulatory Visit (HOSPITAL_COMMUNITY): Payer: Self-pay | Admitting: Registered Nurse

## 2023-10-16 DIAGNOSIS — R1310 Dysphagia, unspecified: Secondary | ICD-10-CM

## 2023-10-16 DIAGNOSIS — R059 Cough, unspecified: Secondary | ICD-10-CM

## 2023-11-01 ENCOUNTER — Ambulatory Visit (HOSPITAL_COMMUNITY)
Admission: RE | Admit: 2023-11-01 | Discharge: 2023-11-01 | Disposition: A | Discharge status: Home / Self Care | Source: Ambulatory Visit | Attending: Internal Medicine

## 2023-11-01 ENCOUNTER — Ambulatory Visit (HOSPITAL_COMMUNITY)
Admission: RE | Admit: 2023-11-01 | Discharge: 2023-11-01 | Disposition: A | Discharge status: Home / Self Care | Source: Ambulatory Visit | Attending: Internal Medicine | Admitting: Internal Medicine

## 2023-11-01 DIAGNOSIS — R1310 Dysphagia, unspecified: Secondary | ICD-10-CM

## 2023-11-01 DIAGNOSIS — R1312 Dysphagia, oropharyngeal phase: Secondary | ICD-10-CM | POA: Diagnosis not present

## 2023-11-01 DIAGNOSIS — R059 Cough, unspecified: Secondary | ICD-10-CM

## 2023-11-01 DIAGNOSIS — R1313 Dysphagia, pharyngeal phase: Secondary | ICD-10-CM | POA: Diagnosis not present

## 2023-11-01 DIAGNOSIS — R638 Other symptoms and signs concerning food and fluid intake: Secondary | ICD-10-CM | POA: Diagnosis not present

## 2023-11-01 NOTE — Progress Notes (Signed)
 Modified Barium Swallow Study  Patient Details  Name: Bryan Hart MRN: [id number] Date of Birth: [birth year]  Today's Date: 11/01/2023  Modified Barium Swallow completed.  Full report located under Chart Review in the Imaging Section.  History of Present Illness Pt is a 73 year old male referred by primary care physician due to report of difficulty swallowing. Pt reports that he is "swallowing too early" and not getting it down. Explains he needs to suck stuff out of his sinuses to swallow what is stuck in his throat. Pt has additionally observed hypophonia, inability to sing with his group, and wife has observed a shuffling gait. He has been diagnosed with a mild dementia and is taking a medication for this. Pt has never seen an SLP.   Clinical Impression Pt demonstrates a moderate pharyngeal dysphagia with a DIGEST severity score of 1, characterized primarily by poor efficiency. Pt has relatively good oral control and timely swallow intiation. He has about 25% of residue remaining in vallculae post swallow (spills to pyriform) with solids and liquids. There is decreased base of tongue tension, decreased duration and ROM with laryngeal elevation and hyoid excursion. There is also an appearance of bony protrusions along the cervical spine partially impeding bolus flow through PES. Esophageal sweep showed barium table hesitate in multiple locations with retrograde flow of puree bolus given to aid in transit. Throughout study pt had only rare trace penetration to the vocal folds with intermittent throat clear response. Pt benefitted from a Mendelsohn maneuver to increase bolus propulsion and duration of PES opening. Pt cued to prolong swallow with increased force. Quantity of residue was reduced, but not eliminated. Second swallow also needed, as well as following solids with liquids. Strongly encouraged pt to f/u with OP SLP to target both swallowing and voice. Pts hypophonia improved with cued for  breath support. Pt to continue regular/thin diet. Factors that may increase risk of adverse event in presence of aspiration Roderick Civatte & Jessy Morocco 2021): Weak cough  DIGEST Swallow Severity Rating*  Safety:   Efficiency:  Overall Pharyngeal Swallow Severity:  1: mild; 2: moderate; 3: severe; 4: profound  *The Dynamic Imaging Grade of Swallowing Toxicity is standardized for the head and neck cancer population, however, demonstrates promising clinical applications across populations to standardize the clinical rating of pharyngeal swallow safety and severity.   Swallow Evaluation Recommendations Recommendations: PO diet PO Diet Recommendation: Regular;Thin liquids (Level 0) Liquid Administration via: Cup;Straw Medication Administration: Whole meds with puree Supervision: Patient able to self-feed Swallowing strategies  : Swallow hold (Mendelsohn maneuver);effortful swallow;Follow solids with liquids;Multiple dry swallows after each bite/sip Postural changes: Position pt fully upright for meals Oral care recommendations: Oral care BID (2x/day)   Noble Bateman, MA CCC-SLP  Acute Rehabilitation Services Secure Chat Preferred Office [phone]    Valeri Gate 11/01/2023,12:50 PM

## 2023-11-07 ENCOUNTER — Encounter: Payer: Self-pay | Admitting: Cardiovascular Disease

## 2023-11-07 ENCOUNTER — Ambulatory Visit: Payer: PPO | Attending: Cardiovascular Disease | Admitting: Cardiovascular Disease

## 2023-11-07 VITALS — BP 106/70 | HR 56 | Ht 72.0 in | Wt 170.0 lb

## 2023-11-07 DIAGNOSIS — E782 Mixed hyperlipidemia: Secondary | ICD-10-CM | POA: Diagnosis not present

## 2023-11-07 DIAGNOSIS — I493 Ventricular premature depolarization: Secondary | ICD-10-CM | POA: Diagnosis not present

## 2023-11-07 DIAGNOSIS — I480 Paroxysmal atrial fibrillation: Secondary | ICD-10-CM

## 2023-11-07 DIAGNOSIS — G4731 Primary central sleep apnea: Secondary | ICD-10-CM | POA: Diagnosis not present

## 2023-11-07 DIAGNOSIS — I10 Essential (primary) hypertension: Secondary | ICD-10-CM | POA: Diagnosis not present

## 2023-11-07 NOTE — Assessment & Plan Note (Signed)
 History of 1 episode of PAF 04/04/2023.  He was sent to the ER and converted spontaneously with IV diltiazem .  He was seen in the A-fib clinic and started on Eliquis  with a CV risk of 4.  He has not had any clinical recurrences since.

## 2023-11-07 NOTE — Assessment & Plan Note (Signed)
 History of asymptomatic PVCs.  He did have an event monitor performed 07/26/2022 that showed occasional PVCs, PACs and short runs of SVT which he does not feel.

## 2023-11-07 NOTE — Assessment & Plan Note (Signed)
Intolerant to CPAP 

## 2023-11-07 NOTE — Patient Instructions (Signed)
Medication Instructions:  Your physician recommends that you continue on your current medications as directed. Please refer to the Current Medication list given to you today.  *If you need a refill on your cardiac medications before your next appointment, please call your pharmacy*   Lab Work: None If you have labs (blood work) drawn today and your tests are completely normal, you will receive your results only by: Marland Kitchen MyChart Message (if you have MyChart) OR . A paper copy in the mail If you have any lab test that is abnormal or we need to change your treatment, we will call you to review the results.   Testing/Procedures: None   Follow-Up: At [facility], you and your health needs are our priority.  As part of our continuing mission to provide you with exceptional heart care, we have created designated Provider Care Teams.  These Care Teams include your primary Cardiologist (physician) and Advanced Practice Providers (APPs -  Physician Assistants and Nurse Practitioners) who all work together to provide you with the care you need, when you need it.  We recommend signing up for the patient portal called "MyChart".  Sign up information is provided on this After Visit Summary.  MyChart is used to connect with patients for Virtual Visits (Telemedicine).  Patients are able to view lab/test results, encounter notes, upcoming appointments, etc.  Non-urgent messages can be sent to your provider as well.   To learn more about what you can do with MyChart, go to [email or web address].    Your next appointment:   1 year(s)  The format for your next appointment:   In Person  Provider:   Quay Burow, MD   Other Instructions

## 2023-11-07 NOTE — Assessment & Plan Note (Signed)
 History of essential hypertension with blood pressure measured today at 106/70.  He is on metoprolol .

## 2023-11-07 NOTE — Assessment & Plan Note (Signed)
 History of hyperlipidemia on atorvastatin  with lipid profile performed 07/05/2023 revealing total cholesterol 115, LDL 53 and HDL 54.

## 2023-11-07 NOTE — Progress Notes (Signed)
 11/07/2023 Bryan Hart   Mar 19, 1951  [id number]  Primary Physician Windell Hasty, DO Primary Cardiologist: Avanell Leigh MD Bennye Bravo, [place]  HPI:  Bryan Hart is a 73 y.o.   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  He was referred by Dr. Maryl Snook for evaluation of asymptomatic PVCs.  He is accompanied by his wife Deatra Face today.  His risk factors include treated hypertension and hyperlipidemia.  He has a strong family history of heart disease with a father who had a myocardial infarction at age 63, brother who had bypass surgery at 70 and another brother had a myocardial infarction.  He has never had a heart attack but has had a remote TIA.  He is active and works out at [employer] 3-4 times a week on the elliptical.  He denies chest pain or shortness of breath.  He does have a history of obstructive sleep apnea intolerant to CPAP.  He was having unexplained weight loss as well, although this has stabilized.Bryan Hart  He does have asymptomatic PVCs.,  And a event monitor performed 07/26/2022 showed occasional PACs, PVCs and short runs of SVT.  He had an episode of PAF 04/04/2023.  He was seen in the ER and converted spontaneously with IV diltiazem .  He was seen in the A-fib clinic and begun on Eliquis  with a CD score of 4.  He has had no clinical recurrences.  He does not work as much as he used to.  He denies chest pain or shortness of breath.   Current Meds  Medication Sig   atorvastatin  (LIPITOR) 20 MG tablet Take 20 mg by mouth daily at 6 PM.   ciclopirox  (PENLAC ) 8 % solution Apply topically at bedtime. Apply over nail and surrounding skin. Apply daily over previous coat. After seven (7) days, may remove with alcohol  and continue cycle.   Ciclopirox  0.77 % gel Apply 1 Application topically 2 (two) times daily.   memantine  (NAMENDA ) 10 MG tablet Take 1 tablet (10 mg total) by mouth 2  (two) times daily.   methocarbamol  (ROBAXIN ) 500 MG tablet Take 1 tablet (500 mg total) by mouth every 6 (six) hours as needed for muscle spasms.   metoprolol  tartrate 37.5 MG TABS Take 1 tablet (37.5 mg total) by mouth 2 (two) times daily.     Allergies  Allergen Reactions   Adhesive [Tape] Rash    Social History   Socioeconomic History   Marital status: Married    Spouse name: Devra Fontana   Number of children: Not on file   Years of education: Not on file   Highest education level: Master's degree (e.g., MA, MS, MEng, MEd, MSW, MBA)  Occupational History   Not on file  Tobacco Use   Smoking status: Former    Current packs/day: 0.00    Average packs/day: 0.5 packs/day for 4.0 years (2.0 ttl pk-yrs)    Types: Cigarettes    Start date: 43    Quit date: 42    Years since quitting: 50.3   Smokeless tobacco: Never   Tobacco comments:    Former smoker 04/12/23  Vaping Use   Vaping status: Never Used  Substance and Sexual Activity   Alcohol  use: Yes    Alcohol /week: 1.0 - 2.0 standard drink of alcohol     Types: 1 - 2 Cans of beer per week    Comment: daily  Drug use: No   Sexual activity: Not on file  Other Topics Concern   Not on file  Social History Narrative   Lives home with spouse Devra Fontana.   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   Left handed    Social Drivers of Corporate investment banker Strain: Not on file  Food Insecurity: Not on file  Transportation Needs: Not on file  Physical Activity: Not on file  Stress: Not on file  Social Connections: Not on file  Intimate Partner Violence: Not on file     Review of Systems: General: negative for chills, fever, night sweats or weight changes.  Cardiovascular: negative for chest pain, dyspnea on exertion, edema, orthopnea, palpitations, paroxysmal nocturnal dyspnea or shortness of breath Dermatological: negative for rash Respiratory: negative for cough or wheezing Urologic: negative for hematuria Abdominal:  negative for nausea, vomiting, diarrhea, bright red blood per rectum, melena, or hematemesis Neurologic: negative for visual changes, syncope, or dizziness All other systems reviewed and are otherwise negative except as noted above.    Blood pressure 106/70, pulse (!) 56, height 6' (1.829 m), weight 170 lb (77.1 kg), SpO2 97%.  General appearance: alert and no distress Neck: no adenopathy, no carotid bruit, no JVD, supple, symmetrical, trachea midline, and thyroid  not enlarged, symmetric, no tenderness/mass/nodules Lungs: clear to auscultation bilaterally Heart: regular rate and rhythm, S1, S2 normal, no murmur, click, rub or gallop Extremities: extremities normal, atraumatic, no cyanosis or edema Pulses: 2+ and symmetric Skin: Skin color, texture, turgor normal. No rashes or lesions Neurologic: Grossly normal  EKG not performed today   ASSESSMENT AND PLAN:   Central sleep apnea Intolerant to CPAP  Asymptomatic PVCs History of asymptomatic PVCs.  He did have an event monitor performed 07/26/2022 that showed occasional PVCs, PACs and short runs of SVT which he does not feel.  Hyperlipidemia History of hyperlipidemia on atorvastatin  with lipid profile performed 07/05/2023 revealing total cholesterol 115, LDL 53 and HDL 54.  Essential hypertension History of essential hypertension with blood pressure measured today at 106/70.  He is on metoprolol .  Paroxysmal atrial fibrillation (HCC) History of 1 episode of PAF 04/04/2023.  He was sent to the ER and converted spontaneously with IV diltiazem .  He was seen in the A-fib clinic and started on Eliquis  with a CV risk of 4.  He has not had any clinical recurrences since.     Avanell Leigh MD FACP,FACC,FAHA, [facility] 11/07/2023 11:04 AM

## 2023-11-21 ENCOUNTER — Encounter: Payer: Self-pay | Admitting: Podiatry

## 2023-11-21 ENCOUNTER — Ambulatory Visit: Payer: PPO | Admitting: Podiatry

## 2023-11-21 DIAGNOSIS — M79675 Pain in left toe(s): Secondary | ICD-10-CM

## 2023-11-21 DIAGNOSIS — M79674 Pain in right toe(s): Secondary | ICD-10-CM | POA: Diagnosis not present

## 2023-11-21 DIAGNOSIS — Z7901 Long term (current) use of anticoagulants: Secondary | ICD-10-CM

## 2023-11-21 DIAGNOSIS — B351 Tinea unguium: Secondary | ICD-10-CM

## 2023-11-21 NOTE — Progress Notes (Unsigned)
 Subjective: Chief Complaint  Patient presents with   RFC    RM#12 RFC patient states has no concerns at this time.     73 year old male presents after the above concerns.  States his nails get long they are causing pain.  He has been using the topical for the nail fungus but he did not start it right away. No open lesion.   He is on Eliquis .   Objective: AAO x3, NAD DP/PT pulses palpable bilaterally, CRT less than 3 seconds Nails are hypertrophic, dystrophic, brittle, discolored, elongated 10. No surrounding redness or drainage. Tenderness nails 1-5 bilaterally. No open lesions or pre-ulcerative lesions are identified today. No pain with calf compression, swelling, warmth, erythema  Assessment: Symptomatic onychomycosis  Plan: -All treatment options discussed with the patient including all alternatives, risks, complications.  -Sharply debrided nails x 10 without any complications or bleeding. Continue ciclopirox .  -Patient encouraged to call the office with any questions, concerns, change in symptoms.   Return in about 3 months (around 02/21/2024).  Charity Conch DPM

## 2023-11-23 ENCOUNTER — Ambulatory Visit: Attending: Internal Medicine

## 2023-11-23 DIAGNOSIS — R1313 Dysphagia, pharyngeal phase: Secondary | ICD-10-CM | POA: Diagnosis not present

## 2023-11-23 DIAGNOSIS — R499 Unspecified voice and resonance disorder: Secondary | ICD-10-CM | POA: Insufficient documentation

## 2023-11-23 NOTE — Therapy (Signed)
 OUTPATIENT SPEECH LANGUAGE PATHOLOGY SWALLOW EVALUATION   Patient Name: Bryan Hart MRN: [id number] DOB:1951/05/30, 73 y.o., male Today's Date: 11/23/2023  PCP: Windell Hasty, DO REFERRING PROVIDER: same as PCP  END OF SESSION:  End of Session - 11/23/23 1243     Visit Number 1    Number of Visits 13    Date for SLP Re-Evaluation 02/02/24    SLP Start Time 1020    SLP Stop Time  1102    SLP Time Calculation (min) 42 min    Activity Tolerance Patient tolerated treatment well             Past Medical History:  Diagnosis Date   Arthritis    HLD (hyperlipidemia)    Hypertension    dr  ed green      stress test   12 yrs ago   PVC's (premature ventricular contractions) 12 years ago   stress test done   Sleep apnea    Stroke [facility])    minor stroke 01/2018   Past Surgical History:  Procedure Laterality Date   ANKLE ARTHROSCOPY  06/27/2008   JOINT REPLACEMENT  06/27/2009   rt shoulder rotator cuff repair   knee surgeries  [id number]   x2   SHOULDER HEMI-ARTHROPLASTY  01/12/2012   Procedure: SHOULDER HEMI-ARTHROPLASTY;  Surgeon: Derald Flattery, MD;  Location: [facility] OR;  Service: Orthopedics;  Laterality: Right;   SHOULDER SURGERY Right 07/2021   TOTAL KNEE ARTHROPLASTY Right 05/31/2022   Procedure: RIGHT TOTAL KNEE ARTHROPLASTY;  Surgeon: Arnie Lao, MD;  Location: [facility] OR;  Service: Orthopedics;  Laterality: Right;   Patient Active Problem List   Diagnosis Date Noted   Paroxysmal atrial fibrillation (HCC) 04/12/2023   Hypercoagulable state due to paroxysmal atrial fibrillation (HCC) 04/12/2023   Asymptomatic PVCs 07/26/2022   Hyperlipidemia 07/26/2022   Family history of heart disease 07/26/2022   Essential hypertension 07/26/2022   Status post total right knee replacement 05/31/2022   Unilateral primary osteoarthritis, left knee 03/28/2022   Unilateral primary osteoarthritis, right knee 03/28/2022   Ceruminosis, bilateral 08/18/2021   Primary  osteoarthritis of left knee 04/30/2020   Effusion, right knee 03/31/2020   Degenerative arthritis of right knee 03/31/2020   Bilateral primary osteoarthritis of knee 12/27/2018   AV block 07/29/2018   Central sleep apnea 07/29/2018   Cerebrovascular accident [facility]) 07/29/2018   Primary localized osteoarthrosis of shoulder region 01/13/2012    ONSET DATE: "at least 5 years ago" (Script dated 11/09/23)  REFERRING DIAG: Dysphagia, oropharyngeal phase  THERAPY DIAG:  Dysphagia, pharyngeal phase  Voice disorder  Rationale for Evaluation and Treatment: Rehabilitation  SUBJECTIVE:   SUBJECTIVE STATEMENT: "I used to have a booming voice." (Pt, re: voice changes in the last few years)  Pt accompanied by: significant other wife Bryan Hart  PERTINENT HISTORY: CVA 2019, memory disorder. See above.  PAIN:  Are you having pain? No  FALLS: Has patient fallen in last 6 months?  No  LIVING ENVIRONMENT: Lives with: lives with their spouse Lives in: House/apartment  PLOF:  Level of assistance: Independent with ADLs, Independent with IADLs, Needed assistance with IADLS, Comment: Memory difficulty so wife assists PRN Employment: Retired  PATIENT GOALS: Improve swallowing  OBJECTIVE:  Note: Objective measures were completed at Evaluation unless otherwise noted. OBJECTIVE:   DIAGNOSTIC FINDINGS:  NEUROIMAGING REPORT STUDY DATE: 07/26/2023 PATIENT NAME: MARINA DESIRE DOB: 12/28/1950 MRN: [id number]   ORDERING CLINICIAN: Dr. Salli Crawley CLINICAL HISTORY: 73  yr old patient evaluated for memory loss  COMPARISON FILMS: None EXAM: MRI brain with and without contrast TECHNIQUE: Sagittal T1, axial T1, T2, FLAIR, DWI, SWI, coronal T2 and postcontrast axial and coronal T1 images obtained through the brain CONTRAST: 8 mL IV gadopiclenol  IMAGING SITE: [facility] imaging   FINDINGS:  Brain parenchyma shows mild age-related changes of chronic small vessel disease and generalized cerebral atrophy.  No  structural lesion, tumor or infarct is noted.  Diffusion-weighted imaging negative for acute ischemia.  SWI images do not show significant microhemorrhages.  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  Cortical sulci and gyri show normal appearance.  Extra-axial (but normal.  Calvarium shows no abnormalities.  Orbits appear unremarkable paranasal sinuses show mild chronic mucosal thickening.  The pituitary gland and cerebellar tonsils appear normal. The visualized portion of the upper cervical spine shows only minor degenerative changes.  Flow-voids of large vessels are intact and circulation appear to be patent.  Postcontrast images do not result in abnormal areas of enhancement.   IMPRESSION: MRI scan of the brain with and without contrast showing mild age-related changes of chronic small vessel disease and generalized cerebral atrophy.  HISTORY OF PRESENT ILLNESS - neurology December '24 UPDATE (06/12/23, VRP): Since last visit, more progression of symptoms. Decline in sense of direction. Sometimes does not recognize own wife. Memory decline has continued.  INSTRUMENTAL SWALLOW STUDY FINDINGS (MBSS) 11/01/23 Objective swallow impairments:  Pt demonstrates a moderate pharyngeal dysphagia with a DIGEST severity score of 1, characterized primarily by poor efficiency. Pt has relatively good oral control and timely swallow intiation. He has about 25% of residue remaining in vallculae post swallow (spills to pyriform) with solids and liquids. There is decreased base of tongue tension, decreased duration and ROM with laryngeal elevation and hyoid excursion. There is also an appearance of bony protrusions along the cervical spine partially impeding bolus flow through PES. Esophageal sweep showed barium table hesitate in multiple locations with retrograde flow of puree bolus given to aid in transit.  Throughout study pt had only rare trace penetration to the vocal folds with intermittent throat clear response. Pt benefitted from a Mendelsohn maneuver to increase bolus propulsion and duration of PES opening. Pt cued to prolong swallow with increased force. Quantity of residue was reduced, but not eliminated. Second swallow also needed, as well as following solids with liquids. Strongly encouraged pt to f/u with OP SLP to target both swallowing and voice. Pts hypophonia improved with cued for breath support. Pt to continue regular/thin diet.    Factors that may increase risk of adverse event in presence of aspiration Roderick Civatte & Jessy Morocco 2021): Factors that may increase risk of adverse event in presence of aspiration Roderick Civatte & Jessy Morocco 2021): Weak cough   DIGEST Swallow Severity Rating*             Safety:              Efficiency:             Overall Pharyngeal Swallow Severity:  1: mild; 2: moderate; 3: severe; 4: profound  *The Dynamic Imaging Grade of Swallowing Toxicity is standardized for the head and neck cancer population, however, demonstrates promising clinical applications across populations to standardize the clinical rating of pharyngeal swallow safety and severity.   Objective recommended compensations: Recommendations: PO diet PO Diet Recommendation: Regular; Thin liquids (Level 0) Liquid Administration via: Cup; Straw Medication Administration: Whole meds with puree Supervision: Patient able  to self-feed Swallowing strategies  : Swallow hold (Mendelsohn maneuver); effortful swallow; Follow solids with liquids; Multiple dry swallows after each bite/sip Postural changes: Position pt fully upright for meals Oral care recommendations: Oral care BID (2x/day)  COGNITION: Overall cognitive status: History of cognitive impairments - at baseline Areas of impairment:  Memory: Impaired: Short term Functional deficits: Pt may require cues for strategies and for proper procedure for  HEP  SUBJECTIVE DYSPHAGIA REPORTS:  Date of onset: "years" (at least 5) Reported symptoms: coughing with both solids and liquids  Current diet: regular and thin liquids  Co-morbid voice changes: Yes - pt's voice volume average <WNL, mildly hoarse  FACTORS WHICH MAY INCREASE RISK OF ADVERSE EVENT IN PRESENCE OF ASPIRATION:  General health: well appearing  Risk factors: weak cough    ORAL MOTOR EXAMINATION: Overall status: Impaired: Labial: Left (ROM and Strength) Lingual: Left (ROM and Strength) Facial: Bilateral (ROM) Velum: ROM on lt, slightly decreased Comments: Pt's oral musculature ROM was improved with cues, with each instruction.  Pt states he notices his singing voice is not as strong as it once was. "His singing voice was actually really strong," Bryan Hart says.  CLINICAL SWALLOW ASSESSMENT:   Dentition: adequate natural dentition Vocal quality at baseline: low vocal intensity and min hoarse Patient directly observed with POs: Yes: regular and thin liquids  Feeding: able to feed self Liquids provided by: cup Oral phase signs and symptoms: none noted today Pharyngeal phase signs and symptoms: none noted today Comments: Pt req'd mod A initially for following swallowing precautions from recent MBS. Faded cues to rare min A. SLP provided pt handout of precautions. Pt was mod I by end of session. No overt s/sx pharyngeal dysphagia today with use of strategies.  Due to pt's memory disorder, SLP told Bryan Hart she may need to remind pt of precautions until these become habit for pt.    PATIENT REPORTED OUTCOME MEASURES (PROM): EAT-10: provided in first 1-2 sessions                                                                                                                            TREATMENT DATE:   5//29/25: SLP reviewed results and recommendations of MBS with pt and wife. SLP taught pt effortful swallow, and attempted to teach pt Masako but was unable to do so due to time constraints.  He was told to complete 15 reps effortful swallow BID. SLP suggested pt try Masako with 1-2 drops of water and if able to swallow with tongue out past lips he should add 15 reps BID to his HEP until next session. SLP will need to add Kathlen Para and CTAR next session.   PATIENT EDUCATION: Education details: see "treatment date" for details Person educated: Patient and Spouse Education method: Explanation, Demonstration, Verbal cues, and Handouts Education comprehension: verbalized understanding, returned demonstration, verbal cues required, and needs further education   ASSESSMENT:  CLINICAL IMPRESSION: Patient is a 73 y.o. M who was  seen today for assessment of swallowing following MBS 11/01/23. Pt reported current sx are memory difficulty, reduced voice volume and strength, disordered gait pattern, left lingual, and labial weakness and bil decr'd velar, lingual, and labial ROM without notable dysarthria, and hypomimia which improved with cues (x2 separate instances) to exaggerate facial movements. Pt would like to participate in skilled ST for swallowing. Referring MD may want to consider referral for ENT due to pt's voice difficulties.  OBJECTIVE IMPAIRMENTS: include memory, voice disorder, and dysphagia. These impairments are limiting patient from household responsibilities, ADLs/IADLs, effectively communicating at home and in community, and safety when swallowing. Factors affecting potential to achieve goals and functional outcome are ability to learn/carryover information. Patient will benefit from skilled SLP services to address above impairments and improve overall function.  REHAB POTENTIAL: Good   GOALS: Goals reviewed with patient? Yes  SHORT TERM GOALS: Target date: 01/05/24 (due to first ST session 12/24/23)  Pt will complete HEP with rare min A Baseline: Goal status: INITIAL  2.  Pt will follow swallowing precautions with rare min A Baseline:  Goal status: INITIAL  3.  Pt  will tell SLP 3 overt s/sx aspiration PNA with mod I Baseline:  Goal status: INITIAL   LONG TERM GOALS: Target date: 02/02/24   Pt will improve PROM Baseline:  Goal status: INITIAL  2.  Pt will complete HEP with mod I in 3 sessions Baseline:  Goal status: INITIAL  3.  Pt will follow swallowing precautions with mod I in 3 sessions Baseline:  Goal status: INITIAL   PLAN:  SLP FREQUENCY: 2x/week  SLP DURATION: 8 weeks  PLANNED INTERVENTIONS: Aspiration precaution training, Pharyngeal strengthening exercises, Diet toleration management , Environmental controls, Trials of upgraded texture/liquids, Internal/external aids, SLP instruction and feedback, Compensatory strategies, Patient/family education, [phone] Treatment of speech (30 or 45 min) , [id number] Treatment of swallowing function, and [id number]- Speech Eval Behavioral Qualitative Voice Resonance    [facility], CCC-SLP 11/23/2023, 9:06 PM

## 2023-11-23 NOTE — Patient Instructions (Signed)
  During meals, please do these things to keep your swallowing safer: Swallow hard and hold swallow for 1/2-1 seconds Follow solids with liquids;  Multiple dry swallows after each bite/sip  Whole meds with puree

## 2023-11-28 ENCOUNTER — Telehealth: Payer: Self-pay | Admitting: Diagnostic Neuroimaging

## 2023-11-28 NOTE — Telephone Encounter (Signed)
 Pt's wife called wanting to speak to the nurse regarding a swallowing test and speech therapy that the pt just had. Please advise.

## 2023-11-28 NOTE — Telephone Encounter (Signed)
 Called the patient's wife back. She was just advising our office that the patient was having some difficulty with his swallowing. Pcp ordered a barrium swallow test which the patient completed on 5/7. Since then he has been continuing to also work with ST. Notes are in epic. They were told that it may be worth passing the information along and making MD aware here. I advised I would do that. She was appreciative for the call back.

## 2023-12-05 ENCOUNTER — Ambulatory Visit: Attending: Internal Medicine

## 2023-12-05 DIAGNOSIS — R499 Unspecified voice and resonance disorder: Secondary | ICD-10-CM | POA: Diagnosis not present

## 2023-12-05 DIAGNOSIS — R1313 Dysphagia, pharyngeal phase: Secondary | ICD-10-CM | POA: Insufficient documentation

## 2023-12-05 NOTE — Therapy (Signed)
 OUTPATIENT SPEECH LANGUAGE PATHOLOGY SWALLOW TREATMENT   Patient Name: Bryan Hart MRN: [id number] DOB:September 15, 1950, 73 y.o., male Today's Date: 12/05/2023  PCP: Bryan Hasty, DO REFERRING PROVIDER: same as PCP  END OF SESSION:  End of Session - 12/05/23 0807     Visit Number 2    Number of Visits 17    Date for SLP Re-Evaluation 02/02/24    SLP Start Time 0806    SLP Stop Time  0846    SLP Time Calculation (min) 40 min    Activity Tolerance Patient tolerated treatment well             Past Medical History:  Diagnosis Date   Arthritis    HLD (hyperlipidemia)    Hypertension    dr  ed green      stress test   12 yrs ago   PVC's (premature ventricular contractions) 12 years ago   stress test done   Sleep apnea    Stroke [facility])    minor stroke 01/2018   Past Surgical History:  Procedure Laterality Date   ANKLE ARTHROSCOPY  06/27/2008   JOINT REPLACEMENT  06/27/2009   rt shoulder rotator cuff repair   knee surgeries  [id number]   x2   SHOULDER HEMI-ARTHROPLASTY  01/12/2012   Procedure: SHOULDER HEMI-ARTHROPLASTY;  Surgeon: Bryan Flattery, MD;  Location: [facility] OR;  Service: Orthopedics;  Laterality: Right;   SHOULDER SURGERY Right 07/2021   TOTAL KNEE ARTHROPLASTY Right 05/31/2022   Procedure: RIGHT TOTAL KNEE ARTHROPLASTY;  Surgeon: Bryan Lao, MD;  Location: [facility] OR;  Service: Orthopedics;  Laterality: Right;   Patient Active Problem List   Diagnosis Date Noted   Paroxysmal atrial fibrillation (HCC) 04/12/2023   Hypercoagulable state due to paroxysmal atrial fibrillation (HCC) 04/12/2023   Asymptomatic PVCs 07/26/2022   Hyperlipidemia 07/26/2022   Family history of heart disease 07/26/2022   Essential hypertension 07/26/2022   Status post total right knee replacement 05/31/2022   Unilateral primary osteoarthritis, left knee 03/28/2022   Unilateral primary osteoarthritis, right knee 03/28/2022   Ceruminosis, bilateral 08/18/2021   Primary  osteoarthritis of left knee 04/30/2020   Effusion, right knee 03/31/2020   Degenerative arthritis of right knee 03/31/2020   Bilateral primary osteoarthritis of knee 12/27/2018   AV block 07/29/2018   Central sleep apnea 07/29/2018   Cerebrovascular accident [facility]) 07/29/2018   Primary localized osteoarthrosis of shoulder region 01/13/2012    ONSET DATE: "at least 5 years ago" (Script dated 11/09/23)  REFERRING DIAG: Dysphagia, oropharyngeal phase  THERAPY DIAG:  Dysphagia, pharyngeal phase  Voice disorder  Rationale for Evaluation and Treatment: Rehabilitation  SUBJECTIVE:   SUBJECTIVE STATEMENT: "I think this will take some new learning." (Pt, re: swallow strategies)  Pt accompanied by: significant other wife Bryan Hart  PERTINENT HISTORY: CVA 2019, memory disorder. See above.  PAIN:  Are you having pain? No  FALLS: Has patient fallen in last 6 months?  No   PATIENT GOALS: Improve swallowing  OBJECTIVE:  Note: Objective measures were completed at Evaluation unless otherwise noted. OBJECTIVE:   DIAGNOSTIC FINDINGS:  NEUROIMAGING REPORT STUDY DATE: 07/26/2023 PATIENT NAME: Bryan Hart DOB: 04-Apr-1951 MRN: [id number]   ORDERING CLINICIAN: Dr. Salli Hart CLINICAL HISTORY: 73  yr old patient evaluated for memory loss COMPARISON FILMS: None EXAM: MRI brain with and without contrast TECHNIQUE: Sagittal T1, axial T1, T2, FLAIR, DWI, SWI, coronal T2 and postcontrast axial and coronal T1 images obtained through the brain CONTRAST: 8 mL IV gadopiclenol  IMAGING SITE: [employer]  imaging   FINDINGS:  Brain parenchyma shows mild age-related changes of chronic small vessel disease and generalized cerebral atrophy.  No structural lesion, tumor or infarct is noted.  Diffusion-weighted imaging negative for acute ischemia.  SWI images do not show significant microhemorrhages.  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  Cortical sulci and gyri show normal appearance.  Extra-axial (but normal.  Calvarium shows no abnormalities.  Orbits appear unremarkable paranasal sinuses show mild chronic mucosal thickening.  The pituitary gland and cerebellar tonsils appear normal. The visualized portion of the upper cervical spine shows only minor degenerative changes.  Flow-voids of large vessels are intact and circulation appear to be patent.  Postcontrast images do not result in abnormal areas of enhancement.   IMPRESSION: MRI scan of the brain with and without contrast showing mild age-related changes of chronic small vessel disease and generalized cerebral atrophy.  HISTORY OF PRESENT ILLNESS - neurology December '24 UPDATE (06/12/23, VRP): Since last visit, more progression of symptoms. Decline in sense of direction. Sometimes does not recognize own wife. Memory decline has continued.  INSTRUMENTAL SWALLOW STUDY FINDINGS (MBSS) 11/01/23 Objective swallow impairments:  Pt demonstrates a moderate pharyngeal dysphagia with a DIGEST severity score of 1, characterized primarily by poor efficiency. Pt has relatively good oral control and timely swallow intiation. He has about 25% of residue remaining in vallculae post swallow (spills to pyriform) with solids and liquids. There is decreased base of tongue tension, decreased duration and ROM with laryngeal elevation and hyoid excursion. There is also an appearance of bony protrusions along the cervical spine partially impeding bolus flow through PES. Esophageal sweep showed barium table hesitate in multiple locations with retrograde flow of puree bolus given to aid in transit. Throughout study pt had only rare trace penetration to the vocal folds with intermittent throat clear response. Pt benefitted from a Mendelsohn maneuver to increase bolus propulsion and duration of PES opening. Pt cued to prolong swallow with increased force. Quantity  of residue was reduced, but not eliminated. Second swallow also needed, as well as following solids with liquids. Strongly encouraged pt to f/u with OP SLP to target both swallowing and voice. Pts hypophonia improved with cued for breath support. Pt to continue regular/thin diet.    Factors that may increase risk of adverse event in presence of aspiration Roderick Civatte & Jessy Morocco 2021): Factors that may increase risk of adverse event in presence of aspiration Roderick Civatte & Jessy Morocco 2021): Weak cough   DIGEST Swallow Severity Rating*             Safety:              Efficiency:             Overall Pharyngeal Swallow Severity:  1: mild; 2: moderate; 3: severe; 4: profound  *The Dynamic Imaging Grade of Swallowing Toxicity is standardized for the head and neck cancer population, however, demonstrates promising clinical applications across populations to standardize the clinical rating of pharyngeal swallow safety and severity.   Objective recommended compensations: Recommendations: PO diet PO Diet Recommendation: Regular; Thin liquids (Level 0) Liquid Administration via: Cup; Straw Medication Administration: Whole meds with puree Supervision: Patient able to self-feed Swallowing strategies  : Swallow hold (Mendelsohn maneuver); effortful swallow; Follow solids with liquids; Multiple dry swallows after each bite/sip Postural changes: Position pt fully upright for meals Oral care recommendations: Oral care BID (2x/day)   PATIENT  REPORTED OUTCOME MEASURES (PROM): EAT-10: provided in first 1-2 sessions                                                                                                                            TREATMENT DATE:   12/05/23: NEEDS PROM next session. Pt reports he is using written cues for following precautions at mealtimes. He is completing effortful swallow at home. SLP educated pt on procedure for Mendelsohn, Masako, and chin tuck against resistance (with towel). Pt req'd mod A  and faded to mod I. Pt to complete x2/day with 15 reps each.   11/23/23: SLP reviewed results and recommendations of MBS with pt and wife. SLP taught pt effortful swallow, and attempted to teach pt Masako but was unable to do so due to time constraints. He was told to complete 15 reps effortful swallow BID. SLP suggested pt try Masako with 1-2 drops of water and if able to swallow with tongue out past lips he should add 15 reps BID to his HEP until next session. SLP will need to add Kathlen Para and CTAR next session.   PATIENT EDUCATION: Education details: see "treatment date" for details Person educated: Patient Education method: Explanation, Demonstration, Verbal cues, and Handouts Education comprehension: verbalized understanding, returned demonstration, verbal cues required, and needs further education   ASSESSMENT:  CLINICAL IMPRESSION: Patient is a 73 y.o. M who was seen today for treatment of swallowing following MBS 11/01/23. Today he was taught Kathlen Para, Masako, and chin tuck against resistance. Pt reported current sx are memory difficulty, reduced voice volume and strength, disordered gait pattern, left lingual, and labial weakness and bil decr'd velar, lingual, and labial ROM without notable dysarthria, and hypomimia which improved with cues (x2 separate instances) to exaggerate facial movements. Pt would like to participate in skilled ST for swallowing. Referring MD may want to consider referral for ENT due to pt's voice difficulties.  OBJECTIVE IMPAIRMENTS: include memory, voice disorder, and dysphagia. These impairments are limiting patient from household responsibilities, ADLs/IADLs, effectively communicating at home and in community, and safety when swallowing. Factors affecting potential to achieve goals and functional outcome are ability to learn/carryover information. Patient will benefit from skilled SLP services to address above impairments and improve overall function.  REHAB  POTENTIAL: Good   GOALS: Goals reviewed with patient? Yes  SHORT TERM GOALS: Target date: 01/05/24 (due to first ST session 12/24/23)  Pt will complete HEP with rare min A Baseline: Goal status: INITIAL  2.  Pt will follow swallowing precautions with rare min A Baseline:  Goal status: INITIAL  3.  Pt will tell SLP 3 overt s/sx aspiration PNA with mod I Baseline:  Goal status: INITIAL   LONG TERM GOALS: Target date: 02/02/24   Pt will improve PROM Baseline:  Goal status: INITIAL  2.  Pt will complete HEP with mod I in 3 sessions Baseline:  Goal status: INITIAL  3.  Pt will follow swallowing precautions with mod I  in 3 sessions Baseline:  Goal status: INITIAL   PLAN:  SLP FREQUENCY: 2x/week  SLP DURATION: 8 weeks  PLANNED INTERVENTIONS: Aspiration precaution training, Pharyngeal strengthening exercises, Diet toleration management , Environmental controls, Trials of upgraded texture/liquids, Internal/external aids, SLP instruction and feedback, Compensatory strategies, Patient/family education, [phone] Treatment of speech (30 or 45 min) , 92526 Treatment of swallowing function, and 13244- Speech Eval Behavioral Qualitative Voice Resonance    [facility], CCC-SLP 12/05/2023, 8:08 AM

## 2023-12-05 NOTE — Patient Instructions (Addendum)
   SWALLOWING EXERCISES Do these 5-7 days/week until 02/09/24, then 2 times per week afterwards  **USE A COUPLE OF DROPS OF WATER FOR EACH EXERCISE if you need to**  Effortful Swallows - Press your tongue against the roof of your mouth for 3 seconds,  - Swallow a couple drops of water as hard as you can - Repeat 15 times, 2 times a day  Masako Swallow - swallow with your tongue sticking out - Stick tongue out past your lips and gently bite tongue with your teeth - Swallow, while holding your tongue with your teeth - Repeat 15 times, 2 times a day  Bryan Hart - "swallow and squeeze" exercise - Start to swallow, and squeeze your swallow hard to keep your Adams apple up - Hold for 5-7 seconds and then relax - Repeat 15 times, 2 times a day  4.   Chin tuck - Place a rolled up towel (4 inches wide) under your chin near your neck - Tuck your chin and push hard on the towel for 5 seconds - Repeat 15 times, twice a day

## 2023-12-07 ENCOUNTER — Ambulatory Visit

## 2023-12-07 DIAGNOSIS — R1313 Dysphagia, pharyngeal phase: Secondary | ICD-10-CM

## 2023-12-07 DIAGNOSIS — R499 Unspecified voice and resonance disorder: Secondary | ICD-10-CM

## 2023-12-07 NOTE — Therapy (Signed)
 OUTPATIENT SPEECH LANGUAGE PATHOLOGY SWALLOW TREATMENT   Patient Name: Bryan Hart MRN: [id number] DOB:20-Jan-1951, 73 y.o., male Today's Date: 12/07/2023  PCP: Bryan Hasty, DO REFERRING PROVIDER: same as PCP  END OF SESSION:  End of Session - 12/07/23 1402     Visit Number 3    Number of Visits 17    Date for SLP Re-Evaluation 02/02/24    SLP Start Time 1403    SLP Stop Time  1445    SLP Time Calculation (min) 42 min    Activity Tolerance Patient tolerated treatment well           Past Medical History:  Diagnosis Date   Arthritis    HLD (hyperlipidemia)    Hypertension    dr  ed green      stress test   12 yrs ago   PVC's (premature ventricular contractions) 12 years ago   stress test done   Sleep apnea    Stroke [facility])    minor stroke 01/2018   Past Surgical History:  Procedure Laterality Date   ANKLE ARTHROSCOPY  06/27/2008   JOINT REPLACEMENT  06/27/2009   rt shoulder rotator cuff repair   knee surgeries  [id number]   x2   SHOULDER HEMI-ARTHROPLASTY  01/12/2012   Procedure: SHOULDER HEMI-ARTHROPLASTY;  Surgeon: Derald Flattery, MD;  Location: [facility] OR;  Service: Orthopedics;  Laterality: Right;   SHOULDER SURGERY Right 07/2021   TOTAL KNEE ARTHROPLASTY Right 05/31/2022   Procedure: RIGHT TOTAL KNEE ARTHROPLASTY;  Surgeon: Bryan Lao, MD;  Location: [facility] OR;  Service: Orthopedics;  Laterality: Right;   Patient Active Problem List   Diagnosis Date Noted   Paroxysmal atrial fibrillation (HCC) 04/12/2023   Hypercoagulable state due to paroxysmal atrial fibrillation (HCC) 04/12/2023   Asymptomatic PVCs 07/26/2022   Hyperlipidemia 07/26/2022   Family history of heart disease 07/26/2022   Essential hypertension 07/26/2022   Status post total right knee replacement 05/31/2022   Unilateral primary osteoarthritis, left knee 03/28/2022   Unilateral primary osteoarthritis, right knee 03/28/2022   Ceruminosis, bilateral 08/18/2021   Primary  osteoarthritis of left knee 04/30/2020   Effusion, right knee 03/31/2020   Degenerative arthritis of right knee 03/31/2020   Bilateral primary osteoarthritis of knee 12/27/2018   AV block 07/29/2018   Central sleep apnea 07/29/2018   Cerebrovascular accident (HCC) 07/29/2018   Primary localized osteoarthrosis of shoulder region 01/13/2012    ONSET DATE: at least 5 years ago (Script dated 11/09/23)  REFERRING DIAG: Dysphagia, oropharyngeal phase  THERAPY DIAG:  Dysphagia, pharyngeal phase  Voice disorder  Rationale for Evaluation and Treatment: Rehabilitation  SUBJECTIVE:   SUBJECTIVE STATEMENT: When I'm swallowing the water it's like I'm swallowing before I even put the cup down. (Pt, re: swallow strategies)  Pt accompanied by: significant other wife Bryan Hart  PERTINENT HISTORY: CVA 2019, memory disorder. See above.  PAIN:  Are you having pain? No  FALLS: Has patient fallen in last 6 months?  No   PATIENT GOALS: Improve swallowing  OBJECTIVE:  Note: Objective measures were completed at Evaluation unless otherwise noted. OBJECTIVE:   DIAGNOSTIC FINDINGS:  NEUROIMAGING REPORT STUDY DATE: 07/26/2023 PATIENT NAME: Bryan Hart DOB: March 01, 1951 MRN: [id number]   ORDERING CLINICIAN: Dr. Salli Hart CLINICAL HISTORY: 74  yr old patient evaluated for memory loss COMPARISON FILMS: None EXAM: MRI brain with and without contrast TECHNIQUE: Sagittal T1, axial T1, T2, FLAIR, DWI, SWI, coronal T2 and postcontrast axial and coronal T1 images obtained through the brain CONTRAST: 8  mL IV gadopiclenol  IMAGING SITE: [employer] imaging   FINDINGS:  Brain parenchyma shows mild age-related changes of chronic small vessel disease and generalized cerebral atrophy.  No structural lesion, tumor or infarct is noted.  Diffusion-weighted imaging negative for acute ischemia.  SWI images do not show significant microhemorrhages.  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  Cortical sulci and gyri show normal appearance.  Extra-axial (but normal.  Calvarium shows no abnormalities.  Orbits appear unremarkable paranasal sinuses show mild chronic mucosal thickening.  The pituitary gland and cerebellar tonsils appear normal. The visualized portion of the upper cervical spine shows only minor degenerative changes.  Flow-voids of large vessels are intact and circulation appear to be patent.  Postcontrast images do not result in abnormal areas of enhancement.   IMPRESSION: MRI scan of the brain with and without contrast showing mild age-related changes of chronic small vessel disease and generalized cerebral atrophy.  HISTORY OF PRESENT ILLNESS - neurology December '24 UPDATE (06/12/23, VRP): Since last visit, more progression of symptoms. Decline in sense of direction. Sometimes does not recognize own wife. Memory decline has continued.  INSTRUMENTAL SWALLOW STUDY FINDINGS (MBSS) 11/01/23 Objective swallow impairments:  Pt demonstrates a moderate pharyngeal dysphagia with a DIGEST severity score of 1, characterized primarily by poor efficiency. Pt has relatively good oral control and timely swallow intiation. He has about 25% of residue remaining in vallculae post swallow (spills to pyriform) with solids and liquids. There is decreased base of tongue tension, decreased duration and ROM with laryngeal elevation and hyoid excursion. There is also an appearance of bony protrusions along the cervical spine partially impeding bolus flow through PES. Esophageal sweep showed barium table hesitate in multiple locations with retrograde flow of puree bolus given to aid in transit. Throughout study pt had only rare trace penetration to the vocal folds with intermittent throat clear response. Pt benefitted from a Mendelsohn maneuver to increase bolus propulsion and duration of PES opening. Pt cued to  prolong swallow with increased force. Quantity of residue was reduced, but not eliminated. Second swallow also needed, as well as following solids with liquids. Strongly encouraged pt to f/u with OP SLP to target both swallowing and voice. Pts hypophonia improved with cued for breath support. Pt to continue regular/thin diet.    Factors that may increase risk of adverse event in presence of aspiration Roderick Civatte & Jessy Morocco 2021): Factors that may increase risk of adverse event in presence of aspiration Roderick Civatte & Jessy Morocco 2021): Weak cough   DIGEST Swallow Severity Rating*             Safety:              Efficiency:             Overall Pharyngeal Swallow Severity:  1: mild; 2: moderate; 3: severe; 4: profound  *The Dynamic Imaging Grade of Swallowing Toxicity is standardized for the head and neck cancer population, however, demonstrates promising clinical applications across populations to standardize the clinical rating of pharyngeal swallow safety and severity.   Objective recommended compensations: Recommendations: PO diet PO Diet Recommendation: Regular; Thin liquids (Level 0) Liquid Administration via: Cup; Straw Medication Administration: Whole meds with puree Supervision: Patient able to self-feed Swallowing strategies  : Swallow hold (Mendelsohn maneuver); effortful swallow; Follow solids with liquids; Multiple dry swallows after each bite/sip Postural changes: Position pt fully upright for meals Oral care recommendations: Oral  care BID (2x/day)   PATIENT REPORTED OUTCOME MEASURES (PROM): EAT-10: pt scored himself 18/40 with lower scores indicating less impact of swallowing sx on QoL. 4 (severe problem) was answered for swallowing solids takes extra effort, the pleasure of eating is affected by my swallowing, and 3 was answered for swallowing pills takes extra effort, and swallowing is stressful.                                                                                                                              TREATMENT DATE:   12/07/23: SLP provided pt with PROM today - see above for details. With crackers and water pt req'd usual mod cues for alternating solid-liquid and for multiple swallows. He was independent after initial min-mod cues for swallow hold 1/2-1 second. With HEP, pt req'd usual mod A for all exercises faded to occasional min-mod A, except chin tuck with towel where cues faded to occasional min A. SLP strongly reiterated to pt to have Bryan Hart with him to keep him performing HEP correctly.   12/05/23: NEEDS PROM next session. Pt reports he is using written cues for following precautions at mealtimes. He is completing effortful swallow at home. SLP educated pt on procedure for Mendelsohn, Masako, and chin tuck against resistance (with towel). Pt req'd mod A and faded to mod I. Pt to complete x2/day with 15 reps each.   11/23/23: SLP reviewed results and recommendations of MBS with pt and wife. SLP taught pt effortful swallow, and attempted to teach pt Masako but was unable to do so due to time constraints. He was told to complete 15 reps effortful swallow BID. SLP suggested pt try Masako with 1-2 drops of water and if able to swallow with tongue out past lips he should add 15 reps BID to his HEP until next session. SLP will need to add Kathlen Para and CTAR next session.   PATIENT EDUCATION: Education details: see treatment date for details Person educated: Patient Education method: Explanation, Demonstration, Verbal cues, and Handouts Education comprehension: verbalized understanding, returned demonstration, verbal cues required, and needs further education   ASSESSMENT:  CLINICAL IMPRESSION: Patient is a 73 y.o. M who was seen today for treatment of swallowing following MBS 11/01/23. See treatment date above for today's date for further details on today's session. Pt reported current sx are memory difficulty, reduced voice volume and strength, disordered  gait pattern, left lingual, and labial weakness and bil decr'd velar, lingual, and labial ROM without notable dysarthria, and hypomimia which improved with cues (x2 separate instances) to exaggerate facial movements. Pt would like to participate in skilled ST for swallowing. Referring MD may want to consider referral for ENT due to pt's voice difficulties.  OBJECTIVE IMPAIRMENTS: include memory, voice disorder, and dysphagia. These impairments are limiting patient from household responsibilities, ADLs/IADLs, effectively communicating at home and in community, and safety when swallowing. Factors affecting potential to achieve goals and functional outcome are ability to learn/carryover information.  Patient will benefit from skilled SLP services to address above impairments and improve overall function.  REHAB POTENTIAL: Good   GOALS: Goals reviewed with patient? Yes  SHORT TERM GOALS: Target date: 01/05/24 (due to first ST session 12/24/23)  Pt will complete HEP with rare min A Baseline: Goal status: INITIAL  2.  Pt will follow swallowing precautions with rare min A Baseline:  Goal status: INITIAL  3.  Pt will tell SLP 3 overt s/sx aspiration PNA with mod I Baseline:  Goal status: INITIAL   LONG TERM GOALS: Target date: 02/02/24   Pt will improve PROM Baseline:  Goal status: INITIAL  2.  Pt will complete HEP with mod I in 3 sessions Baseline:  Goal status: INITIAL  3.  Pt will follow swallowing precautions with mod I in 3 sessions Baseline:  Goal status: INITIAL   PLAN:  SLP FREQUENCY: 2x/week  SLP DURATION: 8 weeks  PLANNED INTERVENTIONS: Aspiration precaution training, Pharyngeal strengthening exercises, Diet toleration management , Environmental controls, Trials of upgraded texture/liquids, Internal/external aids, SLP instruction and feedback, Compensatory strategies, Patient/family education, [phone] Treatment of speech (30 or 45 min) , 92526 Treatment of swallowing  function, and 29562- Speech Eval Behavioral Qualitative Voice Resonance    [facility], CCC-SLP 12/07/2023, 2:03 PM

## 2023-12-12 ENCOUNTER — Encounter: Payer: Self-pay | Admitting: Diagnostic Neuroimaging

## 2023-12-12 ENCOUNTER — Telehealth: Payer: PPO | Admitting: Diagnostic Neuroimaging

## 2023-12-12 DIAGNOSIS — F03A18 Unspecified dementia, mild, with other behavioral disturbance: Secondary | ICD-10-CM | POA: Diagnosis not present

## 2023-12-12 MED ORDER — MEMANTINE HCL 10 MG PO TABS: 10.0000 mg | ORAL_TABLET | Freq: Two times a day (BID) | ORAL | 4 refills | Status: AC

## 2023-12-12 NOTE — Progress Notes (Signed)
 [facility]  PATIENT: Bryan Hart DOB: 08/02/1950  REFERRING CLINICIAN: Windell Hasty, DO  HISTORY FROM: patient  REASON FOR VISIT: follow up    HISTORICAL  CHIEF COMPLAINT: memory loss   HISTORY OF PRESENT ILLNESS:   UPDATE (12/12/23, VRP): Since last visit, doing well. Some progression per wife. Now on memantine  and some benefit. Tolerating meds.   UPDATE (06/12/23, VRP): Since last visit, more progression of symptoms. Decline in sense of direction. Sometimes does not recognize own wife. Memory decline has continued.  UPDATE (11/01/22, VRP): Since last visit, doing about the same. Symptoms are stable to slightly progressed. Severity is mild. No alleviating or aggravating factors.  UPDATE (08/17/21, VRP): Since last visit, doing well. Mild right hand tremor (mother had tremor; 2 other brothers have tremor now). Mild short term memory loss, diff with naming, since last 2 years. No major changes ADLs. Overall doing well.   UPDATE (03/19/19, VRP): Since last visit, doing well. Symptoms are stable. Severity is mild. No alleviating or aggravating factors. Tolerating meds.  PRIOR HPI (03/12/18): 73 year old left-handed male here for evaluation of stroke.  History of hypertension and sleep apnea.  Approximately 3 weeks of patient noticed mild numbness in his right hand, digits 3 and 4, with some clumsiness of the right hand.  He had recently started a muscle relaxer and thought this may be a side effect.  Few days later patient went into PCP for evaluation, who recognized possible stroke symptoms and ordered MRI of the brain.  MRI of the brain which confirmed acute to subacute ischemic infarction of the left precentral gyrus, correlating to patient's symptoms.  Echocardiogram and carotid ultrasound were obtained which were unremarkable.  Patient was started on aspirin  81 mg/day and referred here for further follow-up.  Patient denies any numbness or weakness in the right  face and right leg.  No headaches.  No recent injuries or traumas.  Symptoms are gradually improving.  Patient was diagnosed with mild to moderate sleep apnea 6-7 years ago, but could not tolerate CPAP mask.  He did not have any other further follow-up.   REVIEW OF SYSTEMS: Full 14 system review of systems performed and negative with exception of: as per HPI.   ALLERGIES: Allergies  Allergen Reactions   Adhesive [Tape] Rash    HOME MEDICATIONS: Outpatient Medications Prior to Visit  Medication Sig Dispense Refill   apixaban  (ELIQUIS ) 5 MG TABS tablet Take 1 tablet (5 mg total) by mouth 2 (two) times daily. 60 tablet 5   atorvastatin  (LIPITOR) 20 MG tablet Take 20 mg by mouth daily at 6 PM.     ciclopirox  (PENLAC ) 8 % solution Apply topically at bedtime. Apply over nail and surrounding skin. Apply daily over previous coat. After seven (7) days, may remove with alcohol  and continue cycle. 6.6 mL 2   Ciclopirox  0.77 % gel Apply 1 Application topically 2 (two) times daily. 45 g 2   memantine  (NAMENDA  TITRATION PACK) tablet pack 5 mg/day for =1 week; 5 mg twice daily for =1 week; 15 mg/day given in 5 mg and 10 mg separated doses for =1 week; then 10 mg twice daily 49 tablet 0   memantine  (NAMENDA ) 10 MG tablet Take 1 tablet (10 mg total) by mouth 2 (two) times daily. 180 tablet 1   methocarbamol  (ROBAXIN ) 500 MG tablet Take 1 tablet (500 mg total) by mouth every 6 (six) hours as needed for muscle spasms. 40 tablet 1   metoprolol  tartrate 37.5 MG  TABS Take 1 tablet (37.5 mg total) by mouth 2 (two) times daily. 180 tablet 3   No facility-administered medications prior to visit.    PAST MEDICAL HISTORY: Past Medical History:  Diagnosis Date   Arthritis    HLD (hyperlipidemia)    Hypertension    dr  ed green      stress test   12 yrs ago   PVC's (premature ventricular contractions) 12 years ago   stress test done   Sleep apnea    Stroke [facility])    minor stroke 01/2018    PAST SURGICAL  HISTORY: Past Surgical History:  Procedure Laterality Date   ANKLE ARTHROSCOPY  06/27/2008   JOINT REPLACEMENT  06/27/2009   rt shoulder rotator cuff repair   knee surgeries  [id number]   x2   SHOULDER HEMI-ARTHROPLASTY  01/12/2012   Procedure: SHOULDER HEMI-ARTHROPLASTY;  Surgeon: Derald Flattery, MD;  Location: [facility] OR;  Service: Orthopedics;  Laterality: Right;   SHOULDER SURGERY Right 07/2021   TOTAL KNEE ARTHROPLASTY Right 05/31/2022   Procedure: RIGHT TOTAL KNEE ARTHROPLASTY;  Surgeon: Arnie Lao, MD;  Location: [facility] OR;  Service: Orthopedics;  Laterality: Right;    FAMILY HISTORY: Family History  Problem Relation Age of Onset   Heart disease Father    Hypertension Father    Heart attack Brother    Hypertension Brother    Stroke Paternal Grandmother    Heart attack Maternal Uncle    Heart disease Brother    Colon cancer Neg Hx     SOCIAL HISTORY: Social History   Socioeconomic History   Marital status: Married    Spouse name: Devra Fontana   Number of children: Not on file   Years of education: Not on file   Highest education level: Master's degree (e.g., MA, MS, MEng, MEd, MSW, MBA)  Occupational History   Not on file  Tobacco Use   Smoking status: Former    Current packs/day: 0.00    Average packs/day: 0.5 packs/day for 4.0 years (2.0 ttl pk-yrs)    Types: Cigarettes    Start date: 74    Quit date: 49    Years since quitting: 50.4   Smokeless tobacco: Never   Tobacco comments:    Former smoker 04/12/23  Vaping Use   Vaping status: Never Used  Substance and Sexual Activity   Alcohol  use: Yes    Alcohol /week: 1.0 - 2.0 standard drink of alcohol     Types: 1 - 2 Cans of beer per week    Comment: daily   Drug use: No   Sexual activity: Not on file  Other Topics Concern   Not on file  Social History Narrative   Lives home with spouse Devra Fontana.   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   Left handed    Social Drivers of Research scientist (physical sciences) Strain: Not on file  Food Insecurity: Not on file  Transportation Needs: Not on file  Physical Activity: Not on file  Stress: Not on file  Social Connections: Not on file  Intimate Partner Violence: Not on file     PHYSICAL EXAM  GENERAL EXAM/CONSTITUTIONAL: Vitals:  There were no vitals filed for this visit.  There is no height or weight on file to calculate BMI. Wt Readings from Last 3 Encounters:  11/07/23 170 lb (77.1 kg)  10/12/23 168 lb 12.8 oz (76.6 kg)  07/07/23 169 lb (76.7 kg)   Patient is in no distress; well developed, nourished and groomed;  neck is supple  CARDIOVASCULAR: Examination of carotid arteries is normal; no carotid bruits Regular rate and rhythm, no murmurs Examination of peripheral vascular system by observation and palpation is normal  EYES: Ophthalmoscopic exam of optic discs and posterior segments is normal; no papilledema or hemorrhages No results found.  MUSCULOSKELETAL: Gait, strength, tone, movements noted in Neurologic exam below  NEUROLOGIC: MENTAL STATUS:     06/12/2023    3:17 PM 11/01/2022    2:42 PM 08/17/2021    8:55 AM  MMSE - Mini Mental State Exam  Orientation to time 4 5 5   Orientation to Place 4 5 5   Registration 3 3 3   Attention/ Calculation 1 2 4   Recall 3 3 2   Language- name 2 objects 2 2 2   Language- repeat 1 1 1   Language- follow 3 step command 2 3 3   Language- read & follow direction 1 1 1   Write a sentence 1 1 1   Copy design 0 1 1  Total score 22 27 28    awake, alert, oriented to person, place and time recent and remote memory intact normal attention and concentration language fluent, comprehension intact, naming intact fund of knowledge appropriate  CRANIAL NERVE:  2nd - no papilledema on fundoscopic exam 2nd, 3rd, 4th, 6th - pupils equal and reactive to light, visual fields full to confrontation, extraocular muscles intact, no nystagmus 5th - facial sensation symmetric 7th - facial strength  symmetric 8th - hearing intact 9th - palate elevates symmetrically, uvula midline 11th - shoulder shrug symmetric 12th - tongue protrusion midline  MOTOR:  MILD POSTURAL TREMOR IN RUE; MILD TREMOR IN HEAD AND LEFT HAND normal bulk and tone, full strength in the BUE, BLE  SENSORY:  normal and symmetric to light touch, temperature, vibration  COORDINATION:  finger-nose-finger, fine finger movements normal  REFLEXES:  deep tendon reflexes TRACE and symmetric  GAIT/STATION:  narrow based gait     DIAGNOSTIC DATA (LABS, IMAGING, TESTING) - I reviewed patient records, labs, notes, testing and imaging myself where available.  Lab Results  Component Value Date   WBC 5.1 04/04/2023   HGB 13.6 04/04/2023   HCT 41.2 04/04/2023   MCV 94.9 04/04/2023   PLT 252 04/04/2023      Component Value Date/Time   NA 141 04/04/2023 1736   K 4.0 04/04/2023 1736   CL 105 04/04/2023 1736   CO2 29 04/04/2023 1736   GLUCOSE 99 04/04/2023 1736   BUN 23 04/04/2023 1736   CREATININE 1.27 (H) 04/04/2023 1736   CALCIUM  9.3 04/04/2023 1736   PROT 6.6 05/24/2022 0931   ALBUMIN 3.5 05/24/2022 0931   AST 22 05/24/2022 0931   ALT 18 05/24/2022 0931   ALKPHOS 68 05/24/2022 0931   BILITOT 0.7 05/24/2022 0931   GFRNONAA 59 (L) 04/04/2023 1736   GFRAA >60 02/24/2018 1230   No results found for: CHOL, HDL, LDLCALC, LDLDIRECT, TRIG, CHOLHDL No results found for: NWGN5A No results found for: VITAMINB12 Lab Results  Component Value Date   TSH 3.056 04/04/2023   06/12/23 Component Ref Range & Units (hover) 6 mo ago  A -- Beta-amyloid 42/40 Ratio 0.109  Beta-amyloid 42 21.39  Beta-amyloid 40 195.55  T -- p-tau181 1.33 High   N -- NfL, Plasma 4.52  ATN SUMMARY Comment  Comment:                        A- T+ N- A high pTau181 concentration was observed. A normal  beta-amyloid 42/40 ratio and normal concentration of NfL were observed at this time. These results are not consistent  with the presence of Alzheimer's- related pathology, but may indicate pathology of another condition. Additional assessments may be necessary. These tests are intended to be used only in the context of clinical care.    03/05/18 TTE  - Normal LV size and systolic function, EF 55-60%. Normal RV size   and systolic function. Borderline pulmonary hypertension.  03/05/18 carotid u/s Right Carotid: Velocities in the right ICA are consistent with a 1-39% stenosis. Left Carotid: Velocities in the left ICA are consistent with a 1-39% stenosis. Vertebrals:  Bilateral vertebral arteries demonstrate antegrade flow. Subclavians: Normal flow hemodynamics were seen in bilateral subclavian arteries.  02/24/18 MRI brain (with and without) [I reviewed images myself and agree with interpretation. -VRP]  1. 1 cm acute infarct on the left motor strip at the hand knob. 2. 6 mm enhancing nodule medial to the right V4 segment. A schwannoma or benign enhancing foramen magnum lesion is favored. Further discussion above. Recommend follow-up brain MRI to ensure stability. 3. Small remote right frontal cortex infarct.  04/10/19 MRI brain (with and without) demonstrating: - Mild scattered periventricular and subcortical foci of chronic small vessel ischemic disease. Small bilateral frontal cortical ischemic infarcts.  - Small, stable benign enhancing lesion medial to the right vertebral artery (V4 segment).  - No acute findings.  ASSESSMENT AND PLAN  73 y.o. year old male here with HTN, sleep apnea, stroke and dementia.  Dx:  1. Mild dementia with other behavioral disturbance, unspecified dementia type (HCC)     PLAN:  MILD DEMENTIA / MEMORY LOSS (MMSE 22/30; progression of sxs per patient and wife) - continue memantine  10mg  twice a day - try to stay active physically and get some exercise (at least 15-30 minutes per day) - eat a nutritious diet with lean protein, plants / vegetables, whole grains; avoid  ultra-processed foods - increase social activities, brain stimulation, games, puzzles, hobbies, crafts, arts, music; try new activities; keep it fun! - aim for at least 7-8 hours sleep per night (or more) - avoid smoking and alcohol  - caution with medications, finances; no driving - safety / supervision issues reviewed - caregiver resources provided (including [employer], senior resources of [employer], [employer])  STROKE PREVENTION - continue eliquis  (afib), metoprolol , statin   TREMOR  - mild essential tremor; monitor; consider propranolol or primidone  VERTEBRAL ARTERY LESION - non-specific lesion; monitor   SLEEP APNEA - follow up with Dr. Albertina Hugger  Meds ordered this encounter  Medications   memantine  (NAMENDA ) 10 MG tablet    Sig: Take 1 tablet (10 mg total) by mouth 2 (two) times daily.    Dispense:  180 tablet    Refill:  4   Return for return to PCP, pending if symptoms worsen or fail to improve.  Virtual Visit via Video Note  I connected with Bryan Hart on 12/12/2023 at  2:00 PM EDT by a video enabled telemedicine application and verified that I am speaking with the correct person using two identifiers.   I discussed the limitations of evaluation and management by telemedicine and the availability of in person appointments. The patient expressed understanding and agreed to proceed.  Patient is at home and I am at the office.   I spent 30 minutes of face-to-face and non-face-to-face time with patient.  This included previsit chart review, lab review, study review, order entry, electronic health record documentation, patient  education.      Omega Bible, MD 12/12/2023, 1:56 PM Certified in Neurology, Neurophysiology and Neuroimaging  [facility] [address], [address] [place], [place] [address] [phone]

## 2023-12-19 ENCOUNTER — Ambulatory Visit

## 2023-12-19 DIAGNOSIS — R1313 Dysphagia, pharyngeal phase: Secondary | ICD-10-CM | POA: Diagnosis not present

## 2023-12-19 NOTE — Therapy (Signed)
 OUTPATIENT SPEECH LANGUAGE PATHOLOGY SWALLOW TREATMENT   Patient Name: Bryan Hart MRN: [id number] DOB:1951-04-10, 73 y.o., male Today's Date: 12/19/2023  PCP: [name], DO REFERRING PROVIDER: same as PCP  END OF SESSION:  End of Session - 12/19/23 1309     Visit Number 4    Number of Visits 17    Date for SLP Re-Evaluation 02/02/24    SLP Start Time 1020    SLP Stop Time  1101    SLP Time Calculation (min) 41 min    Activity Tolerance Patient tolerated treatment well            Past Medical History:  Diagnosis Date   Arthritis    HLD (hyperlipidemia)    Hypertension    dr  ed green      stress test   12 yrs ago   PVC's (premature ventricular contractions) 12 years ago   stress test done   Sleep apnea    Stroke [facility])    minor stroke 01/2018   Past Surgical History:  Procedure Laterality Date   ANKLE ARTHROSCOPY  06/27/2008   JOINT REPLACEMENT  06/27/2009   rt shoulder rotator cuff repair   knee surgeries  8030,8006   x2   SHOULDER HEMI-ARTHROPLASTY  01/12/2012   Procedure: SHOULDER HEMI-ARTHROPLASTY;  Surgeon: Eva Elsie Herring, MD;  Location: [facility] OR;  Service: Orthopedics;  Laterality: Right;   SHOULDER SURGERY Right 07/2021   TOTAL KNEE ARTHROPLASTY Right 05/31/2022   Procedure: RIGHT TOTAL KNEE ARTHROPLASTY;  Surgeon: [name] Lonni GRADE, MD;  Location: [facility] OR;  Service: Orthopedics;  Laterality: Right;   Patient Active Problem List   Diagnosis Date Noted   Paroxysmal atrial fibrillation (HCC) 04/12/2023   Hypercoagulable state due to paroxysmal atrial fibrillation (HCC) 04/12/2023   Asymptomatic PVCs 07/26/2022   Hyperlipidemia 07/26/2022   Family history of heart disease 07/26/2022   Essential hypertension 07/26/2022   Status post total right knee replacement 05/31/2022   Unilateral primary osteoarthritis, left knee 03/28/2022   Unilateral primary osteoarthritis, right knee 03/28/2022   Ceruminosis, bilateral 08/18/2021   Primary  osteoarthritis of left knee 04/30/2020   Effusion, right knee 03/31/2020   Degenerative arthritis of right knee 03/31/2020   Bilateral primary osteoarthritis of knee 12/27/2018   AV block 07/29/2018   Central sleep apnea 07/29/2018   Cerebrovascular accident (HCC) 07/29/2018   Primary localized osteoarthrosis of shoulder region 01/13/2012    ONSET DATE: at least 5 years ago (Script dated 11/09/23)  REFERRING DIAG: Dysphagia, oropharyngeal phase  THERAPY DIAG:  Dysphagia, pharyngeal phase  Rationale for Evaluation and Treatment: Rehabilitation  SUBJECTIVE:   SUBJECTIVE STATEMENT: I have some good days and some bad days.  Pt accompanied by: self  PERTINENT HISTORY: CVA 2019, memory disorder. See above.  PAIN:  Are you having pain? No  FALLS: Has patient fallen in last 6 months?  No   PATIENT GOALS: Improve swallowing  OBJECTIVE:  Note: Objective measures were completed at Evaluation unless otherwise noted. OBJECTIVE:   DIAGNOSTIC FINDINGS:  NEUROIMAGING REPORT STUDY DATE: 07/26/2023 PATIENT NAME: Bryan Hart DOB: 06/27/1951 MRN: [id number]   ORDERING CLINICIAN: Dr. [name] CLINICAL HISTORY: 73  yr old patient evaluated for memory loss COMPARISON FILMS: None EXAM: MRI brain with and without contrast TECHNIQUE: Sagittal T1, axial T1, T2, FLAIR, DWI, SWI, coronal T2 and postcontrast axial and coronal T1 images obtained through the brain CONTRAST: 8 mL IV gadopiclenol  IMAGING SITE: [facility]   FINDINGS:  Brain parenchyma shows mild age-related  changes of chronic small vessel disease and generalized cerebral atrophy.  No structural lesion, tumor or infarct is noted.  Diffusion-weighted imaging negative for acute ischemia.  SWI images do not show significant microhemorrhages.  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  Cortical sulci and gyri  show normal appearance.  Extra-axial (but normal.  Calvarium shows no abnormalities.  Orbits appear unremarkable paranasal sinuses show mild chronic mucosal thickening.  The pituitary gland and cerebellar tonsils appear normal. The visualized portion of the upper cervical spine shows only minor degenerative changes.  Flow-voids of large vessels are intact and circulation appear to be patent.  Postcontrast images do not result in abnormal areas of enhancement.   IMPRESSION: MRI scan of the brain with and without contrast showing mild age-related changes of chronic small vessel disease and generalized cerebral atrophy.  HISTORY OF PRESENT ILLNESS - neurology December '24 UPDATE (06/12/23, VRP): Since last visit, more progression of symptoms. Decline in sense of direction. Sometimes does not recognize own wife. Memory decline has continued.  INSTRUMENTAL SWALLOW STUDY FINDINGS (MBSS) 11/01/23 Objective swallow impairments:  Pt demonstrates a moderate pharyngeal dysphagia with a DIGEST severity score of 1, characterized primarily by poor efficiency. Pt has relatively good oral control and timely swallow intiation. He has about 25% of residue remaining in vallculae post swallow (spills to pyriform) with solids and liquids. There is decreased base of tongue tension, decreased duration and ROM with laryngeal elevation and hyoid excursion. There is also an appearance of bony protrusions along the cervical spine partially impeding bolus flow through PES. Esophageal sweep showed barium table hesitate in multiple locations with retrograde flow of puree bolus given to aid in transit. Throughout study pt had only rare trace penetration to the vocal folds with intermittent throat clear response. Pt benefitted from a Mendelsohn maneuver to increase bolus propulsion and duration of PES opening. Pt cued to prolong swallow with increased force. Quantity of residue was reduced, but not eliminated. Second swallow also needed,  as well as following solids with liquids. Strongly encouraged pt to f/u with OP SLP to target both swallowing and voice. Pts hypophonia improved with cued for breath support. Pt to continue regular/thin diet.    Factors that may increase risk of adverse event in presence of aspiration Noe & Lianne 2021): Factors that may increase risk of adverse event in presence of aspiration Noe & Lianne 2021): Weak cough   DIGEST Swallow Severity Rating*             Safety:              Efficiency:             Overall Pharyngeal Swallow Severity:  1: mild; 2: moderate; 3: severe; 4: profound  *The Dynamic Imaging Grade of Swallowing Toxicity is standardized for the head and neck cancer population, however, demonstrates promising clinical applications across populations to standardize the clinical rating of pharyngeal swallow safety and severity.   Objective recommended compensations: Recommendations: PO diet PO Diet Recommendation: Regular; Thin liquids (Level 0) Liquid Administration via: Cup; Straw Medication Administration: Whole meds with puree Supervision: Patient able to self-feed Swallowing strategies  : Swallow hold (Mendelsohn maneuver); effortful swallow; Follow solids with liquids; Multiple dry swallows after each bite/sip Postural changes: Position pt fully upright for meals Oral care recommendations: Oral care BID (2x/day)   PATIENT REPORTED OUTCOME MEASURES (PROM): EAT-10: pt scored himself 18/40 with  lower scores indicating less impact of swallowing sx on QoL. 4 (severe problem) was answered for swallowing solids takes extra effort, the pleasure of eating is affected by my swallowing, and 3 was answered for swallowing pills takes extra effort, and swallowing is stressful.                                                                                                                             TREATMENT DATE:   12/19/23: Pt described good days as ones where he remembers  to do the exercises and they feel good.SABRA Describes a bad day when he does not do the exercises or the exercises are more difficult to accomplish (They don't go as smooth). Today pt req'd usual mod A for proper HEP completion. SLP brought pt's wife back to use as a second set of eyes to watch pt complete HEP at home and aid in memory of HEP details. She will do this - SLP suggested her watching and cueing pt until she no longer has to say anything when he performs HEP. Pt req'd written notes in order to complete swallow precautions today. Mod extra time req'd for the first 3 bites/sips and then min extra time req'd.   12/07/23: SLP provided pt with PROM today - see above for details. With crackers and water pt req'd usual mod cues for alternating solid-liquid and for multiple swallows. He was independent after initial min-mod cues for swallow hold 1/2-1 second. With HEP, pt req'd usual mod A for all exercises faded to occasional min-mod A, except chin tuck with towel where cues faded to occasional min A. SLP strongly reiterated to pt to have Pat with him to keep him performing HEP correctly.   12/05/23: NEEDS PROM next session. Pt reports he is using written cues for following precautions at mealtimes. He is completing effortful swallow at home. SLP educated pt on procedure for Mendelsohn, Masako, and chin tuck against resistance (with towel). Pt req'd mod A and faded to mod I. Pt to complete x2/day with 15 reps each.   11/23/23: SLP reviewed results and recommendations of MBS with pt and wife. SLP taught pt effortful swallow, and attempted to teach pt Masako but was unable to do so due to time constraints. He was told to complete 15 reps effortful swallow BID. SLP suggested pt try Masako with 1-2 drops of water and if able to swallow with tongue out past lips he should add 15 reps BID to his HEP until next session. SLP will need to add Claudette and CTAR next session.   PATIENT EDUCATION: Education  details: see treatment date for details Person educated: Patient Education method: Explanation, Demonstration, Verbal cues, and Handouts Education comprehension: verbalized understanding, returned demonstration, verbal cues required, and needs further education   ASSESSMENT:  CLINICAL IMPRESSION: Patient is a 73 y.o. M who was seen today for treatment of swallowing following MBS 11/01/23. See treatment date above for today's date for further  details on today's session. Today SLP found that wife's knowledge about how to perform HEP would be helpful for pt to incr his accuracy with HEP at home. Pt reported current sx are memory difficulty, reduced voice volume and strength, disordered gait pattern, left lingual, and labial weakness and bil decr'd velar, lingual, and labial ROM without notable dysarthria, and hypomimia which improved with cues (x2 separate instances) to exaggerate facial movements. Pt would like to participate in skilled ST for swallowing. Referring MD may want to consider referral for ENT due to pt's voice difficulties.  OBJECTIVE IMPAIRMENTS: include memory, voice disorder, and dysphagia. These impairments are limiting patient from household responsibilities, ADLs/IADLs, effectively communicating at home and in community, and safety when swallowing. Factors affecting potential to achieve goals and functional outcome are ability to learn/carryover information. Patient will benefit from skilled SLP services to address above impairments and improve overall function.  REHAB POTENTIAL: Good   GOALS: Goals reviewed with patient? Yes  SHORT TERM GOALS: Target date: 01/05/24 (due to first ST session 12/24/23)  Pt will complete HEP with rare min A Baseline: Goal status: INITIAL  2.  Pt will follow swallowing precautions with rare min A Baseline:  Goal status: INITIAL  3.  Pt will tell SLP 3 overt s/sx aspiration PNA with mod I Baseline:  Goal status: INITIAL   LONG TERM  GOALS: Target date: 02/02/24   Pt will improve PROM Baseline:  Goal status: INITIAL  2.  Pt will complete HEP with mod I in 3 sessions Baseline:  Goal status: INITIAL  3.  Pt will follow swallowing precautions with mod I in 3 sessions Baseline:  Goal status: INITIAL   PLAN:  SLP FREQUENCY: 2x/week  SLP DURATION: 8 weeks  PLANNED INTERVENTIONS: Aspiration precaution training, Pharyngeal strengthening exercises, Diet toleration management , Environmental controls, Trials of upgraded texture/liquids, Internal/external aids, SLP instruction and feedback, Compensatory strategies, Patient/family education, [phone] Treatment of speech (30 or 45 min) , [id number] Treatment of swallowing function, and [id number]- Speech Eval Behavioral Qualitative Voice Resonance    [facility], CCC-SLP 12/19/2023, 1:14 PM

## 2024-01-02 ENCOUNTER — Ambulatory Visit: Attending: Internal Medicine

## 2024-01-02 DIAGNOSIS — R499 Unspecified voice and resonance disorder: Secondary | ICD-10-CM | POA: Insufficient documentation

## 2024-01-02 DIAGNOSIS — R1313 Dysphagia, pharyngeal phase: Secondary | ICD-10-CM | POA: Insufficient documentation

## 2024-01-02 NOTE — Patient Instructions (Addendum)
    BITE  HARD SWALLOW  HARD SWALLOW  SIP  HARD SWALLOW  HARD SWALLOW  (repeat)  ====================== Signs of Aspiration Pneumonia   Chest pain/tightness Fever (can be low grade) Cough  With foul-smelling phlegm (sputum) With sputum containing pus or blood With greenish sputum Fatigue  Shortness of breath  Wheezing   **IF YOU HAVE THESE SIGNS, CONTACT YOUR DOCTOR OR GO TO THE EMERGENCY DEPARTMENT OR URGENT CARE AS SOON AS POSSIBLE**

## 2024-01-02 NOTE — Therapy (Signed)
 OUTPATIENT SPEECH LANGUAGE PATHOLOGY SWALLOW TREATMENT   Patient Name: Bryan Hart MRN: [id number] DOB:[birth year], 73 y.o., male Today's Date: 01/02/2024  PCP: [name], DO REFERRING PROVIDER: same as PCP  END OF SESSION:  End of Session - 01/02/24 1105     Visit Number 5    Number of Visits 17    Date for SLP Re-Evaluation 02/02/24    SLP Start Time 1018    SLP Stop Time  1052    SLP Time Calculation (min) 34 min    Activity Tolerance Patient tolerated treatment well             Past Medical History:  Diagnosis Date   Arthritis    HLD (hyperlipidemia)    Hypertension    dr  ed green      stress test   12 yrs ago   PVC's (premature ventricular contractions) 12 years ago   stress test done   Sleep apnea    Stroke [facility])    minor stroke 01/2018   Past Surgical History:  Procedure Laterality Date   ANKLE ARTHROSCOPY  06/27/2008   JOINT REPLACEMENT  06/27/2009   rt shoulder rotator cuff repair   knee surgeries  8030,8006   x2   SHOULDER HEMI-ARTHROPLASTY  01/12/2012   Procedure: SHOULDER HEMI-ARTHROPLASTY;  Surgeon: Eva Elsie Herring, MD;  Location: [facility] OR;  Service: Orthopedics;  Laterality: Right;   SHOULDER SURGERY Right 07/2021   TOTAL KNEE ARTHROPLASTY Right 05/31/2022   Procedure: RIGHT TOTAL KNEE ARTHROPLASTY;  Surgeon: [name] Lonni GRADE, MD;  Location: [facility] OR;  Service: Orthopedics;  Laterality: Right;   Patient Active Problem List   Diagnosis Date Noted   Paroxysmal atrial fibrillation (HCC) 04/12/2023   Hypercoagulable state due to paroxysmal atrial fibrillation (HCC) 04/12/2023   Asymptomatic PVCs 07/26/2022   Hyperlipidemia 07/26/2022   Family history of heart disease 07/26/2022   Essential hypertension 07/26/2022   Status post total right knee replacement 05/31/2022   Unilateral primary osteoarthritis, left knee 03/28/2022   Unilateral primary osteoarthritis, right knee 03/28/2022   Ceruminosis, bilateral 08/18/2021   Primary  osteoarthritis of left knee 04/30/2020   Effusion, right knee 03/31/2020   Degenerative arthritis of right knee 03/31/2020   Bilateral primary osteoarthritis of knee 12/27/2018   AV block 07/29/2018   Central sleep apnea 07/29/2018   Cerebrovascular accident [facility] 07/29/2018   Primary localized osteoarthrosis of shoulder region 01/13/2012    ONSET DATE: at least 5 years ago (Script dated 11/09/23)  REFERRING DIAG: Dysphagia, oropharyngeal phase  THERAPY DIAG:  Dysphagia, pharyngeal phase  Voice disorder  Rationale for Evaluation and Treatment: Rehabilitation  SUBJECTIVE:   SUBJECTIVE STATEMENT: I think we need some more tricks. Rondi, re: pt's HEP)  Pt accompanied by: self  PERTINENT HISTORY: CVA 2019, memory disorder. See above.  PAIN:  Are you having pain? No  FALLS: Has patient fallen in last 6 months?  No   PATIENT GOALS: Improve swallowing  OBJECTIVE:  Note: Objective measures were completed at Evaluation unless otherwise noted. OBJECTIVE:   DIAGNOSTIC FINDINGS:  NEUROIMAGING REPORT STUDY DATE: 07/26/2023 PATIENT NAME: Bryan Hart DOB: September 04, 1950 MRN: [id number]   ORDERING CLINICIAN: Dr. [name] CLINICAL HISTORY: 73  yr old patient evaluated for memory loss COMPARISON FILMS: None EXAM: MRI brain with and without contrast TECHNIQUE: Sagittal T1, axial T1, T2, FLAIR, DWI, SWI, coronal T2 and postcontrast axial and coronal T1 images obtained through the brain CONTRAST: 8 mL IV gadopiclenol  IMAGING SITE: [facility]   FINDINGS:  Brain parenchyma shows mild age-related changes of chronic small vessel disease and generalized cerebral atrophy.  No structural lesion, tumor or infarct is noted.  Diffusion-weighted imaging negative for acute ischemia.  SWI images do not show significant microhemorrhages.  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  Cortical sulci and gyri show normal appearance.  Extra-axial (but normal.  Calvarium shows no abnormalities.  Orbits appear unremarkable paranasal sinuses show mild chronic mucosal thickening.  The pituitary gland and cerebellar tonsils appear normal. The visualized portion of the upper cervical spine shows only minor degenerative changes.  Flow-voids of large vessels are intact and circulation appear to be patent.  Postcontrast images do not result in abnormal areas of enhancement.   IMPRESSION: MRI scan of the brain with and without contrast showing mild age-related changes of chronic small vessel disease and generalized cerebral atrophy.  HISTORY OF PRESENT ILLNESS - neurology December '24 UPDATE (06/12/23, VRP): Since last visit, more progression of symptoms. Decline in sense of direction. Sometimes does not recognize own wife. Memory decline has continued.  INSTRUMENTAL SWALLOW STUDY FINDINGS (MBSS) 11/01/23 Objective swallow impairments:  Pt demonstrates a moderate pharyngeal dysphagia with a DIGEST severity score of 1, characterized primarily by poor efficiency. Pt has relatively good oral control and timely swallow intiation. He has about 25% of residue remaining in vallculae post swallow (spills to pyriform) with solids and liquids. There is decreased base of tongue tension, decreased duration and ROM with laryngeal elevation and hyoid excursion. There is also an appearance of bony protrusions along the cervical spine partially impeding bolus flow through PES. Esophageal sweep showed barium table hesitate in multiple locations with retrograde flow of puree bolus given to aid in transit. Throughout study pt had only rare trace penetration to the vocal folds with intermittent throat clear response. Pt benefitted from a Mendelsohn maneuver to increase bolus propulsion and duration of PES opening. Pt cued to prolong swallow with increased force. Quantity of residue was reduced, but not  eliminated. Second swallow also needed, as well as following solids with liquids. Strongly encouraged pt to f/u with OP SLP to target both swallowing and voice. Pts hypophonia improved with cued for breath support. Pt to continue regular/thin diet.    Factors that may increase risk of adverse event in presence of aspiration Noe & Lianne 2021): Factors that may increase risk of adverse event in presence of aspiration Noe & Lianne 2021): Weak cough   DIGEST Swallow Severity Rating*             Safety:              Efficiency:             Overall Pharyngeal Swallow Severity:  1: mild; 2: moderate; 3: severe; 4: profound  *The Dynamic Imaging Grade of Swallowing Toxicity is standardized for the head and neck cancer population, however, demonstrates promising clinical applications across populations to standardize the clinical rating of pharyngeal swallow safety and severity.   Objective recommended compensations: Recommendations: PO diet PO Diet Recommendation: Regular; Thin liquids (Level 0) Liquid Administration via: Cup; Straw Medication Administration: Whole meds with puree Supervision: Patient able to self-feed Swallowing strategies  : Swallow hold (Mendelsohn maneuver); effortful swallow; Follow solids with liquids; Multiple dry swallows after each bite/sip Postural changes: Position pt fully upright for meals Oral care recommendations: Oral care BID (2x/day)   PATIENT REPORTED OUTCOME MEASURES (PROM): EAT-10:  pt scored himself 18/40 with lower scores indicating less impact of swallowing sx on QoL. 4 (severe problem) was answered for swallowing solids takes extra effort, the pleasure of eating is affected by my swallowing, and 3 was answered for swallowing pills takes extra effort, and swallowing is stressful.                                                                                                                             TREATMENT DATE:   01/02/24: Pt and wife  with questions re: CTAR with towel. SLP told them to hold towel and for pt to put chin to floor and press tightly on towel. Pt was not holding towel, but relying on chin to hold towel. Pt and wife vocalized thanks for reiterating holding the towel. With precautions at home, pt is rarely using. SLP suggested no TV while eating - pt is watching with dinner and is using his homemade visual cues but SLP wondered if simplification of this would be helpful. See pt instructions - using this sheet from SLP pt req'd extra time but was mod I with following precautions in quiet environment; SLP remarked this was the least SLP has had to cue pt since practicing precautions in session. Wife agreed. SLP suggested wife and pt take two crackers three times a day in addition to mealtimes for pt to practice precautions. SLP and wife had to cue pt to cont practicing with cracker bites, and cued once when pt asked did I double swallow? SLP shared s/sx aspiration PNA and with mod I pt able to reiterate these to SLP.  12/19/23: Pt described good days as ones where he remembers to do the exercises and they feel good.SABRA Describes a bad day when he does not do the exercises or the exercises are more difficult to accomplish (They don't go as smooth). Today pt req'd usual mod A for proper HEP completion. SLP brought pt's wife back to use as a second set of eyes to watch pt complete HEP at home and aid in memory of HEP details. She will do this - SLP suggested her watching and cueing pt until she no longer has to say anything when he performs HEP. Pt req'd written notes in order to complete swallow precautions today. Mod extra time req'd for the first 3 bites/sips and then min extra time req'd.   12/07/23: SLP provided pt with PROM today - see above for details. With crackers and water pt req'd usual mod cues for alternating solid-liquid and for multiple swallows. He was independent after initial min-mod cues for swallow hold 1/2-1  second. With HEP, pt req'd usual mod A for all exercises faded to occasional min-mod A, except chin tuck with towel where cues faded to occasional min A. SLP strongly reiterated to pt to have Pat with him to keep him performing HEP correctly.   12/05/23: NEEDS PROM next session. Pt reports he is using written cues for following  precautions at mealtimes. He is completing effortful swallow at home. SLP educated pt on procedure for Mendelsohn, Masako, and chin tuck against resistance (with towel). Pt req'd mod A and faded to mod I. Pt to complete x2/day with 15 reps each.   11/23/23: SLP reviewed results and recommendations of MBS with pt and wife. SLP taught pt effortful swallow, and attempted to teach pt Masako but was unable to do so due to time constraints. He was told to complete 15 reps effortful swallow BID. SLP suggested pt try Masako with 1-2 drops of water and if able to swallow with tongue out past lips he should add 15 reps BID to his HEP until next session. SLP will need to add Claudette and CTAR next session.   PATIENT EDUCATION: Education details: see treatment date for details Person educated: Patient Education method: Explanation, Demonstration, Verbal cues, and Handouts Education comprehension: verbalized understanding, returned demonstration, verbal cues required, and needs further education   ASSESSMENT:  CLINICAL IMPRESSION: Patient is a 73 y.o. M who was seen today for treatment of swallowing following MBS 11/01/23. See treatment date above for today's date for further details on today's session. Pt was better with HEP today - occasional min A req'd, and performance with precautions improved with simplified written cues. Pt reported current sx are memory difficulty, reduced voice volume and strength, disordered gait pattern, left lingual, and labial weakness and bil decr'd velar, lingual, and labial ROM without notable dysarthria, and hypomimia which improved with cues (x2  separate instances) to exaggerate facial movements. Pt would like to participate in skilled ST for swallowing. Referring MD may want to consider referral for ENT due to pt's voice difficulties.  OBJECTIVE IMPAIRMENTS: include memory, voice disorder, and dysphagia. These impairments are limiting patient from household responsibilities, ADLs/IADLs, effectively communicating at home and in community, and safety when swallowing. Factors affecting potential to achieve goals and functional outcome are ability to learn/carryover information. Patient will benefit from skilled SLP services to address above impairments and improve overall function.  REHAB POTENTIAL: Good   GOALS: Goals reviewed with patient? Yes  SHORT TERM GOALS: Target date: 01/05/24 (due to first ST session 12/24/23)  Pt will complete HEP with rare min A Baseline:  Goal status: INITIAL  2.  Pt will follow swallowing precautions with rare min A Baseline:  Goal status: INITIAL  3.  Pt will tell SLP 3 overt s/sx aspiration PNA with mod I Baseline:  Goal status: Met   LONG TERM GOALS: Target date: 02/02/24   Pt will improve PROM Baseline:  Goal status: INITIAL  2.  Pt will complete HEP with mod I in 3 sessions Baseline:  Goal status: INITIAL  3.  Pt will follow swallowing precautions with mod I in 3 sessions Baseline:  Goal status: INITIAL   PLAN:  SLP FREQUENCY: 2x/week  SLP DURATION: 8 weeks  PLANNED INTERVENTIONS: Aspiration precaution training, Pharyngeal strengthening exercises, Diet toleration management , Environmental controls, Trials of upgraded texture/liquids, Internal/external aids, SLP instruction and feedback, Compensatory strategies, Patient/family education, [phone] Treatment of speech (30 or 45 min) , [id number] Treatment of swallowing function, and [id number]- Speech Eval Behavioral Qualitative Voice Resonance    [facility], CCC-SLP 01/02/2024, 11:06 AM

## 2024-01-05 ENCOUNTER — Encounter

## 2024-01-10 ENCOUNTER — Ambulatory Visit

## 2024-01-10 DIAGNOSIS — R1313 Dysphagia, pharyngeal phase: Secondary | ICD-10-CM

## 2024-01-10 DIAGNOSIS — R499 Unspecified voice and resonance disorder: Secondary | ICD-10-CM

## 2024-01-10 NOTE — Therapy (Signed)
 OUTPATIENT SPEECH LANGUAGE PATHOLOGY SWALLOW TREATMENT   Patient Name: Bryan Hart MRN: [id number] DOB:January 03, 1951, 73 y.o., male Today's Date: 01/10/2024  PCP: [name], DO REFERRING PROVIDER: same as PCP  END OF SESSION:       Past Medical History:  Diagnosis Date   Arthritis    HLD (hyperlipidemia)    Hypertension    dr  ed green      stress test   12 yrs ago   PVC's (premature ventricular contractions) 12 years ago   stress test done   Sleep apnea    Stroke [facility])    minor stroke 01/2018   Past Surgical History:  Procedure Laterality Date   ANKLE ARTHROSCOPY  06/27/2008   JOINT REPLACEMENT  06/27/2009   rt shoulder rotator cuff repair   knee surgeries  [id number]   x2   SHOULDER HEMI-ARTHROPLASTY  01/12/2012   Procedure: SHOULDER HEMI-ARTHROPLASTY;  Surgeon: Eva Elsie Herring, MD;  Location: [facility] OR;  Service: Orthopedics;  Laterality: Right;   SHOULDER SURGERY Right 07/2021   TOTAL KNEE ARTHROPLASTY Right 05/31/2022   Procedure: RIGHT TOTAL KNEE ARTHROPLASTY;  Surgeon: [name] Lonni GRADE, MD;  Location: [facility] OR;  Service: Orthopedics;  Laterality: Right;   Patient Active Problem List   Diagnosis Date Noted   Paroxysmal atrial fibrillation (HCC) 04/12/2023   Hypercoagulable state due to paroxysmal atrial fibrillation (HCC) 04/12/2023   Asymptomatic PVCs 07/26/2022   Hyperlipidemia 07/26/2022   Family history of heart disease 07/26/2022   Essential hypertension 07/26/2022   Status post total right knee replacement 05/31/2022   Unilateral primary osteoarthritis, left knee 03/28/2022   Unilateral primary osteoarthritis, right knee 03/28/2022   Ceruminosis, bilateral 08/18/2021   Primary osteoarthritis of left knee 04/30/2020   Effusion, right knee 03/31/2020   Degenerative arthritis of right knee 03/31/2020   Bilateral primary osteoarthritis of knee 12/27/2018   AV block 07/29/2018   Central sleep apnea 07/29/2018   Cerebrovascular accident [facility])  07/29/2018   Primary localized osteoarthrosis of shoulder region 01/13/2012    ONSET DATE: at least 5 years ago (Script dated 11/09/23)  REFERRING DIAG: Dysphagia, oropharyngeal phase  THERAPY DIAG:  Dysphagia, pharyngeal phase  Voice disorder  Rationale for Evaluation and Treatment: Rehabilitation  SUBJECTIVE:   SUBJECTIVE STATEMENT: Pat (pt, when SLP asking how he keeps track of rep #)  Pt accompanied by: self  PERTINENT HISTORY: CVA 2019, memory disorder. See above.  PAIN:  Are you having pain? No  FALLS: Has patient fallen in last 6 months?  No   PATIENT GOALS: Improve swallowing  OBJECTIVE:  Note: Objective measures were completed at Evaluation unless otherwise noted. OBJECTIVE:   DIAGNOSTIC FINDINGS:  NEUROIMAGING REPORT STUDY DATE: 07/26/2023 PATIENT NAME: Bryan Hart DOB: 09/24/1950 MRN: [id number]   ORDERING CLINICIAN: Dr. [name] CLINICAL HISTORY: 73  yr old patient evaluated for memory loss COMPARISON FILMS: None EXAM: MRI brain with and without contrast TECHNIQUE: Sagittal T1, axial T1, T2, FLAIR, DWI, SWI, coronal T2 and postcontrast axial and coronal T1 images obtained through the brain CONTRAST: 8 mL IV gadopiclenol  IMAGING SITE: [place] imaging   FINDINGS:  Brain parenchyma shows mild age-related changes of chronic small vessel disease and generalized cerebral atrophy.  No structural lesion, tumor or infarct is noted.  Diffusion-weighted imaging negative for acute ischemia.  SWI images do not show significant microhemorrhages.  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  Cortical sulci and  gyri show normal appearance.  Extra-axial (but normal.  Calvarium shows no abnormalities.  Orbits appear unremarkable paranasal sinuses show mild chronic mucosal thickening.  The pituitary gland and cerebellar tonsils appear normal. The  visualized portion of the upper cervical spine shows only minor degenerative changes.  Flow-voids of large vessels are intact and circulation appear to be patent.  Postcontrast images do not result in abnormal areas of enhancement.   IMPRESSION: MRI scan of the brain with and without contrast showing mild age-related changes of chronic small vessel disease and generalized cerebral atrophy.  HISTORY OF PRESENT ILLNESS - neurology December '24 UPDATE (06/12/23, VRP): Since last visit, more progression of symptoms. Decline in sense of direction. Sometimes does not recognize own wife. Memory decline has continued.  INSTRUMENTAL SWALLOW STUDY FINDINGS (MBSS) 11/01/23 Objective swallow impairments:  Pt demonstrates a moderate pharyngeal dysphagia with a DIGEST severity score of 1, characterized primarily by poor efficiency. Pt has relatively good oral control and timely swallow intiation. He has about 25% of residue remaining in vallculae post swallow (spills to pyriform) with solids and liquids. There is decreased base of tongue tension, decreased duration and ROM with laryngeal elevation and hyoid excursion. There is also an appearance of bony protrusions along the cervical spine partially impeding bolus flow through PES. Esophageal sweep showed barium table hesitate in multiple locations with retrograde flow of puree bolus given to aid in transit. Throughout study pt had only rare trace penetration to the vocal folds with intermittent throat clear response. Pt benefitted from a Mendelsohn maneuver to increase bolus propulsion and duration of PES opening. Pt cued to prolong swallow with increased force. Quantity of residue was reduced, but not eliminated. Second swallow also needed, as well as following solids with liquids. Strongly encouraged pt to f/u with OP SLP to target both swallowing and voice. Pts hypophonia improved with cued for breath support. Pt to continue regular/thin diet.    Factors that may  increase risk of adverse event in presence of aspiration Noe & Lianne 2021): Factors that may increase risk of adverse event in presence of aspiration Noe & Lianne 2021): Weak cough   DIGEST Swallow Severity Rating*             Safety:              Efficiency:             Overall Pharyngeal Swallow Severity:  1: mild; 2: moderate; 3: severe; 4: profound  *The Dynamic Imaging Grade of Swallowing Toxicity is standardized for the head and neck cancer population, however, demonstrates promising clinical applications across populations to standardize the clinical rating of pharyngeal swallow safety and severity.   Objective recommended compensations: Recommendations: PO diet PO Diet Recommendation: Regular; Thin liquids (Level 0) Liquid Administration via: Cup; Straw Medication Administration: Whole meds with puree Supervision: Patient able to self-feed Swallowing strategies  : Swallow hold (Mendelsohn maneuver); effortful swallow; Follow solids with liquids; Multiple dry swallows after each bite/sip Postural changes: Position pt fully upright for meals Oral care recommendations: Oral care BID (2x/day)   PATIENT REPORTED OUTCOME MEASURES (PROM): EAT-10: pt scored himself 18/40 with lower scores indicating less impact of swallowing sx on QoL. 4 (severe problem) was answered for swallowing solids takes extra effort, the pleasure of eating is affected by my swallowing, and 3 was answered for swallowing pills takes extra effort, and swallowing is stressful.  TREATMENT DATE:   01/10/24: Pt req'd cues for taking exercises out and not precautions. Req'd SBA with effortful, usual min cues for tongue protrusion with Masako, and iniital mod A faded to independent with procedure with Mendelsohn. SLP noticed pt unable to track reps so SLP asked pt how he was tracking reps  and he answered Pat. SLP encouraged him to attempt to make it a joint effort but suspect it will be better for wife to track and him focus on procedure.  With precautions, pt req'd initial assistance with sip after bite, but was independent with this with mod I (written cues) and extra time by session end. SLP verified that pt was not eating with distractions due to losing place with sequencing of precautions.   01/02/24: Pt and wife with questions re: CTAR with towel. SLP told them to hold towel and for pt to put chin to floor and press tightly on towel. Pt was not holding towel, but relying on chin to hold towel. Pt and wife vocalized thanks for reiterating holding the towel. With precautions at home, pt is rarely using. SLP suggested no TV while eating - pt is watching with dinner and is using his homemade visual cues but SLP wondered if simplification of this would be helpful. See pt instructions - using this sheet from SLP pt req'd extra time but was mod I with following precautions in quiet environment; SLP remarked this was the least SLP has had to cue pt since practicing precautions in session. Wife agreed. SLP suggested wife and pt take two crackers three times a day in addition to mealtimes for pt to practice precautions. SLP and wife had to cue pt to cont practicing with cracker bites, and cued once when pt asked did I double swallow? SLP shared s/sx aspiration PNA and with mod I pt able to reiterate these to SLP.  12/19/23: Pt described good days as ones where he remembers to do the exercises and they feel good.SABRA Describes a bad day when he does not do the exercises or the exercises are more difficult to accomplish (They don't go as smooth). Today pt req'd usual mod A for proper HEP completion. SLP brought pt's wife back to use as a second set of eyes to watch pt complete HEP at home and aid in memory of HEP details. She will do this - SLP suggested her watching and cueing pt until she no  longer has to say anything when he performs HEP. Pt req'd written notes in order to complete swallow precautions today. Mod extra time req'd for the first 3 bites/sips and then min extra time req'd.   12/07/23: SLP provided pt with PROM today - see above for details. With crackers and water pt req'd usual mod cues for alternating solid-liquid and for multiple swallows. He was independent after initial min-mod cues for swallow hold 1/2-1 second. With HEP, pt req'd usual mod A for all exercises faded to occasional min-mod A, except chin tuck with towel where cues faded to occasional min A. SLP strongly reiterated to pt to have Pat with him to keep him performing HEP correctly.   12/05/23: NEEDS PROM next session. Pt reports he is using written cues for following precautions at mealtimes. He is completing effortful swallow at home. SLP educated pt on procedure for Mendelsohn, Masako, and chin tuck against resistance (with towel). Pt req'd mod A and faded to mod I. Pt to complete x2/day with 15 reps each.   11/23/23: SLP reviewed  results and recommendations of MBS with pt and wife. SLP taught pt effortful swallow, and attempted to teach pt Masako but was unable to do so due to time constraints. He was told to complete 15 reps effortful swallow BID. SLP suggested pt try Masako with 1-2 drops of water and if able to swallow with tongue out past lips he should add 15 reps BID to his HEP until next session. SLP will need to add Claudette and CTAR next session.   PATIENT EDUCATION: Education details: see treatment date for details Person educated: Patient Education method: Explanation, Demonstration, Verbal cues, and Handouts Education comprehension: verbalized understanding, returned demonstration, verbal cues required, and needs further education   ASSESSMENT:  CLINICAL IMPRESSION: Partially met STGs, pt requires more cueing with exercises than his goal. Patient is a 73 y.o. M who was seen today for  treatment of swallowing following MBS 11/01/23. See treatment date above for today's date for further details on today's session. Pt reported current sx are memory difficulty, reduced voice volume and strength, disordered gait pattern, left lingual, and labial weakness and bil decr'd velar, lingual, and labial ROM without notable dysarthria, and hypomimia which improved with cues (x2 separate instances) to exaggerate facial movements. Pt would like to participate in skilled ST for swallowing. Referring MD may want to consider referral for ENT due to pt's voice difficulties.  OBJECTIVE IMPAIRMENTS: include memory, voice disorder, and dysphagia. These impairments are limiting patient from household responsibilities, ADLs/IADLs, effectively communicating at home and in community, and safety when swallowing. Factors affecting potential to achieve goals and functional outcome are ability to learn/carryover information. Patient will benefit from skilled SLP services to address above impairments and improve overall function.  REHAB POTENTIAL: Good   GOALS: Goals reviewed with patient? Yes  SHORT TERM GOALS: Target date: 01/05/24 (due to first ST session 12/24/23)  Pt will complete HEP with rare min A Baseline:  Goal status: partially met  2.  Pt will follow swallowing precautions with rare min A Baseline:  Goal status: met  3.  Pt will tell SLP 3 overt s/sx aspiration PNA with mod I Baseline:  Goal status: Met   LONG TERM GOALS: Target date: 02/02/24   Pt will improve PROM Baseline:  Goal status: INITIAL  2.  Pt will complete HEP with mod I in 3 sessions Baseline:  Goal status: INITIAL  3.  Pt will follow swallowing precautions with mod I in 3 sessions Baseline:  Goal status: INITIAL   PLAN:  SLP FREQUENCY: 2x/week  SLP DURATION: 8 weeks  PLANNED INTERVENTIONS: Aspiration precaution training, Pharyngeal strengthening exercises, Diet toleration management , Environmental controls,  Trials of upgraded texture/liquids, Internal/external aids, SLP instruction and feedback, Compensatory strategies, Patient/family education, [phone] Treatment of speech (30 or 45 min) , [id number] Treatment of swallowing function, and [id number]- Speech Eval Behavioral Qualitative Voice Resonance    [facility], CCC-SLP 01/10/2024, 10:15 AM

## 2024-01-11 DIAGNOSIS — I4891 Unspecified atrial fibrillation: Secondary | ICD-10-CM | POA: Diagnosis not present

## 2024-01-11 DIAGNOSIS — I493 Ventricular premature depolarization: Secondary | ICD-10-CM | POA: Diagnosis not present

## 2024-01-11 DIAGNOSIS — Z8673 Personal history of transient ischemic attack (TIA), and cerebral infarction without residual deficits: Secondary | ICD-10-CM | POA: Diagnosis not present

## 2024-01-11 DIAGNOSIS — M170 Bilateral primary osteoarthritis of knee: Secondary | ICD-10-CM | POA: Diagnosis not present

## 2024-01-11 DIAGNOSIS — R1310 Dysphagia, unspecified: Secondary | ICD-10-CM | POA: Diagnosis not present

## 2024-01-11 DIAGNOSIS — R251 Tremor, unspecified: Secondary | ICD-10-CM | POA: Diagnosis not present

## 2024-01-11 DIAGNOSIS — F03A0 Unspecified dementia, mild, without behavioral disturbance, psychotic disturbance, mood disturbance, and anxiety: Secondary | ICD-10-CM | POA: Diagnosis not present

## 2024-01-11 DIAGNOSIS — G4733 Obstructive sleep apnea (adult) (pediatric): Secondary | ICD-10-CM | POA: Diagnosis not present

## 2024-01-11 DIAGNOSIS — I10 Essential (primary) hypertension: Secondary | ICD-10-CM | POA: Diagnosis not present

## 2024-01-11 DIAGNOSIS — E785 Hyperlipidemia, unspecified: Secondary | ICD-10-CM | POA: Diagnosis not present

## 2024-01-15 ENCOUNTER — Ambulatory Visit

## 2024-01-15 DIAGNOSIS — R1313 Dysphagia, pharyngeal phase: Secondary | ICD-10-CM

## 2024-01-15 DIAGNOSIS — R499 Unspecified voice and resonance disorder: Secondary | ICD-10-CM

## 2024-01-15 NOTE — Therapy (Signed)
 OUTPATIENT SPEECH LANGUAGE PATHOLOGY SWALLOW TREATMENT   Patient Name: Bryan Hart MRN: [id number] DOB:[birth year], 73 y.o., male Today's Date: 01/15/2024  PCP: Bryan Skates, DO REFERRING PROVIDER: same as PCP  END OF SESSION:  End of Session - 01/15/24 1223     Visit Number 7    Number of Visits 17    Date for SLP Re-Evaluation 02/02/24    SLP Start Time 1020    SLP Stop Time  1057    SLP Time Calculation (min) 37 min    Activity Tolerance Patient tolerated treatment well              Past Medical History:  Diagnosis Date   Arthritis    HLD (hyperlipidemia)    Hypertension    dr  ed green      stress test   12 yrs ago   PVC's (premature ventricular contractions) 12 years ago   stress test done   Sleep apnea    Stroke [facility])    minor stroke 01/2018   Past Surgical History:  Procedure Laterality Date   ANKLE ARTHROSCOPY  06/27/2008   JOINT REPLACEMENT  06/27/2009   rt shoulder rotator cuff repair   knee surgeries  8030,8006   x2   SHOULDER HEMI-ARTHROPLASTY  01/12/2012   Procedure: SHOULDER HEMI-ARTHROPLASTY;  Surgeon: Bryan Elsie Herring, MD;  Location: [facility] OR;  Service: Orthopedics;  Laterality: Right;   SHOULDER SURGERY Right 07/2021   TOTAL KNEE ARTHROPLASTY Right 05/31/2022   Procedure: RIGHT TOTAL KNEE ARTHROPLASTY;  Surgeon: Bryan Lonni GRADE, MD;  Location: [facility] OR;  Service: Orthopedics;  Laterality: Right;   Patient Active Problem List   Diagnosis Date Noted   Paroxysmal atrial fibrillation (HCC) 04/12/2023   Hypercoagulable state due to paroxysmal atrial fibrillation (HCC) 04/12/2023   Asymptomatic PVCs 07/26/2022   Hyperlipidemia 07/26/2022   Family history of heart disease 07/26/2022   Essential hypertension 07/26/2022   Status post total right knee replacement 05/31/2022   Unilateral primary osteoarthritis, left knee 03/28/2022   Unilateral primary osteoarthritis, right knee 03/28/2022   Ceruminosis, bilateral 08/18/2021   Primary  osteoarthritis of left knee 04/30/2020   Effusion, right knee 03/31/2020   Degenerative arthritis of right knee 03/31/2020   Bilateral primary osteoarthritis of knee 12/27/2018   AV block 07/29/2018   Central sleep apnea 07/29/2018   Cerebrovascular accident [facility]) 07/29/2018   Primary localized osteoarthrosis of shoulder region 01/13/2012    ONSET DATE: at least 5 years ago (Script dated 11/09/23)  REFERRING DIAG: Dysphagia, oropharyngeal phase  THERAPY DIAG:  Dysphagia, pharyngeal phase  Voice disorder  Rationale for Evaluation and Treatment: Rehabilitation  SUBJECTIVE:   SUBJECTIVE STATEMENT: Pat (pt, when SLP asking how he keeps track of rep #)  Pt accompanied by: self  PERTINENT HISTORY: CVA 2019, memory disorder. See above.  PAIN:  Are you having pain? No  FALLS: Has patient fallen in last 6 months?  No   PATIENT GOALS: Improve swallowing  OBJECTIVE:  Note: Objective measures were completed at Evaluation unless otherwise noted. OBJECTIVE:   DIAGNOSTIC FINDINGS:  NEUROIMAGING REPORT STUDY DATE: 07/26/2023 PATIENT NAME: [name] DOB: 10-29-1950 MRN: [id number]   ORDERING CLINICIAN: Dr. [name] Hart HISTORY: 73  yr old patient evaluated for memory loss COMPARISON FILMS: None EXAM: MRI brain with and without contrast TECHNIQUE: Sagittal T1, axial T1, T2, FLAIR, DWI, SWI, coronal T2 and postcontrast axial and coronal T1 images obtained through the brain CONTRAST: 8 mL IV gadopiclenol  IMAGING SITE: [place] imaging  FINDINGS:  Brain parenchyma shows mild age-related changes of chronic small vessel disease and generalized cerebral atrophy.  No structural lesion, tumor or infarct is noted.  Diffusion-weighted imaging negative for acute ischemia.  SWI images do not show significant microhemorrhages.  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  Cortical sulci and gyri show normal appearance.  Extra-axial (but normal.  Calvarium shows no abnormalities.  Orbits appear unremarkable paranasal sinuses show mild chronic mucosal thickening.  The pituitary gland and cerebellar tonsils appear normal. The visualized portion of the upper cervical spine shows only minor degenerative changes.  Flow-voids of large vessels are intact and circulation appear to be patent.  Postcontrast images do not result in abnormal areas of enhancement.   IMPRESSION: MRI scan of the brain with and without contrast showing mild age-related changes of chronic small vessel disease and generalized cerebral atrophy.  HISTORY OF PRESENT ILLNESS - neurology December '24 UPDATE (06/12/23, VRP): Since last visit, more progression of symptoms. Decline in sense of direction. Sometimes does not recognize own wife. Memory decline has continued.  INSTRUMENTAL SWALLOW STUDY FINDINGS (MBSS) 11/01/23 Objective swallow impairments:  Pt demonstrates a moderate pharyngeal dysphagia with a DIGEST severity score of 1, characterized primarily by poor efficiency. Pt has relatively good oral control and timely swallow intiation. He has about 25% of residue remaining in vallculae post swallow (spills to pyriform) with solids and liquids. There is decreased base of tongue tension, decreased duration and ROM with laryngeal elevation and hyoid excursion. There is also an appearance of bony protrusions along the cervical spine partially impeding bolus flow through PES. Esophageal sweep showed barium table hesitate in multiple locations with retrograde flow of puree bolus given to aid in transit. Throughout study pt had only rare trace penetration to the vocal folds with intermittent throat clear response. Pt benefitted from a Mendelsohn maneuver to increase bolus propulsion and duration of PES opening. Pt cued to prolong swallow with increased force. Quantity of residue was reduced, but not  eliminated. Second swallow also needed, as well as following solids with liquids. Strongly encouraged pt to f/u with OP SLP to target both swallowing and voice. Pts hypophonia improved with cued for breath support. Pt to continue regular/thin diet.    Factors that may increase risk of adverse event in presence of aspiration Noe & Lianne 2021): Factors that may increase risk of adverse event in presence of aspiration Noe & Lianne 2021): Weak cough   DIGEST Swallow Severity Rating*             Safety:              Efficiency:             Overall Pharyngeal Swallow Severity:  1: mild; 2: moderate; 3: severe; 4: profound  *The Dynamic Imaging Hart of Swallowing Toxicity is standardized for the head and neck cancer population, however, demonstrates promising Hart applications across populations to standardize the Hart rating of pharyngeal swallow safety and severity.   Objective recommended compensations: Recommendations: PO diet PO Diet Recommendation: Regular; Thin liquids (Level 0) Liquid Administration via: Cup; Straw Medication Administration: Whole meds with puree Supervision: Patient able to self-feed Swallowing strategies  : Swallow hold (Mendelsohn maneuver); effortful swallow; Follow solids with liquids; Multiple dry swallows after each bite/sip Postural changes: Position pt fully upright for meals Oral care recommendations: Oral care BID (2x/day)   PATIENT REPORTED OUTCOME MEASURES (  PROM): EAT-10: pt scored himself 18/40 with lower scores indicating less impact of swallowing sx on QoL. 4 (severe problem) was answered for swallowing solids takes extra effort, the pleasure of eating is affected by my swallowing, and 3 was answered for swallowing pills takes extra effort, and swallowing is stressful.                                                                                                                             TREATMENT DATE:   01/15/24: With HEP, pt  demonstrated fatigue after 5-6 swallows of effortful and of Masako. Ultimately pt req'd rare min A with effortful, usual mod A for tongue protrusion with Masako, usual min A for holding swallow with Mendelsohn, Fatigue noted after 7 reps of Mendelsohn. Pt req'd initial min A however only req'd mod I and consistent extra time to achieve success with following precautions with two peanut butter crackers and water.  Wife states pt doing better both with eating and with HEP at home.  Pt's voice measured today at average 68dB, below WNL. SLP to consider adding loudness retraining tasks to pt's POC.   01/10/24: Pt req'd cues for taking exercises out and not precautions. Req'd SBA with effortful, usual min cues for tongue protrusion with Masako, and iniital mod A faded to independent with procedure with Mendelsohn. SLP noticed pt unable to track reps so SLP asked pt how he was tracking reps and he answered Pat. SLP encouraged him to attempt to make it a joint effort but suspect it will be better for wife to track and him focus on procedure.  With precautions, pt req'd initial assistance with sip after bite, but was independent with this with mod I (written cues) and extra time by session end. SLP verified that pt was not eating with distractions due to losing place with sequencing of precautions.   01/02/24: Pt and wife with questions re: CTAR with towel. SLP told them to hold towel and for pt to put chin to floor and press tightly on towel. Pt was not holding towel, but relying on chin to hold towel. Pt and wife vocalized thanks for reiterating holding the towel. With precautions at home, pt is rarely using. SLP suggested no TV while eating - pt is watching with dinner and is using his homemade visual cues but SLP wondered if simplification of this would be helpful. See pt instructions - using this sheet from SLP pt req'd extra time but was mod I with following precautions in quiet environment; SLP remarked this  was the least SLP has had to cue pt since practicing precautions in session. Wife agreed. SLP suggested wife and pt take two crackers three times a day in addition to mealtimes for pt to practice precautions. SLP and wife had to cue pt to cont practicing with cracker bites, and cued once when pt asked did I double swallow? SLP shared s/sx aspiration PNA and with mod I pt able  to reiterate these to SLP.  12/19/23: Pt described good days as ones where he remembers to do the exercises and they feel good.SABRA Describes a bad day when he does not do the exercises or the exercises are more difficult to accomplish (They don't go as smooth). Today pt req'd usual mod A for proper HEP completion. SLP brought pt's wife back to use as a second set of eyes to watch pt complete HEP at home and aid in memory of HEP details. She will do this - SLP suggested her watching and cueing pt until she no longer has to say anything when he performs HEP. Pt req'd written notes in order to complete swallow precautions today. Mod extra time req'd for the first 3 bites/sips and then min extra time req'd.   12/07/23: SLP provided pt with PROM today - see above for details. With crackers and water pt req'd usual mod cues for alternating solid-liquid and for multiple swallows. He was independent after initial min-mod cues for swallow hold 1/2-1 second. With HEP, pt req'd usual mod A for all exercises faded to occasional min-mod A, except chin tuck with towel where cues faded to occasional min A. SLP strongly reiterated to pt to have Pat with him to keep him performing HEP correctly.   12/05/23: NEEDS PROM next session. Pt reports he is using written cues for following precautions at mealtimes. He is completing effortful swallow at home. SLP educated pt on procedure for Mendelsohn, Masako, and chin tuck against resistance (with towel). Pt req'd mod A and faded to mod I. Pt to complete x2/day with 15 reps each.   11/23/23: SLP reviewed  results and recommendations of MBS with pt and wife. SLP taught pt effortful swallow, and attempted to teach pt Masako but was unable to do so due to time constraints. He was told to complete 15 reps effortful swallow BID. SLP suggested pt try Masako with 1-2 drops of water and if able to swallow with tongue out past lips he should add 15 reps BID to his HEP until next session. SLP will need to add Claudette and CTAR next session.   PATIENT EDUCATION: Education details: see treatment date for details Person educated: Patient Education method: Explanation, Demonstration, Verbal cues, and Handouts Education comprehension: verbalized understanding, returned demonstration, verbal cues required, and needs further education   ASSESSMENT:  Hart IMPRESSION: Patient is a 73 y.o. M who was seen today for treatment of swallowing following MBS 11/01/23. See treatment date above for today's date for further details on today's session. Pt reported current sx are memory difficulty, reduced voice volume and strength, disordered gait pattern, left lingual, and labial weakness and bil decr'd velar, lingual, and labial ROM without notable dysarthria, and hypomimia which improved with cues (x2 separate instances) to exaggerate facial movements. Pt would like to participate in skilled ST for swallowing. Referring MD may want to consider referral for ENT due to pt's voice difficulties.  OBJECTIVE IMPAIRMENTS: include memory, voice disorder, and dysphagia. These impairments are limiting patient from household responsibilities, ADLs/IADLs, effectively communicating at home and in community, and safety when swallowing. Factors affecting potential to achieve goals and functional outcome are ability to learn/carryover information. Patient will benefit from skilled SLP services to address above impairments and improve overall function.  REHAB POTENTIAL: Good   GOALS: Goals reviewed with patient? Yes  SHORT TERM  GOALS: Target date: 01/05/24 (due to first ST session 12/24/23)  Pt will complete HEP with rare min A Baseline:  Goal status: partially met  2.  Pt will follow swallowing precautions with rare min A Baseline:  Goal status: met  3.  Pt will tell SLP 3 overt s/sx aspiration PNA with mod I Baseline:  Goal status: Met   LONG TERM GOALS: Target date: 02/02/24   Pt will improve PROM Baseline:  Goal status: INITIAL  2.  Pt will complete HEP with mod I in 3 sessions Baseline:  Goal status: INITIAL  3.  Pt will follow swallowing precautions with mod I in 3 sessions Baseline: 01/15/24 Goal status: INITIAL   PLAN:  SLP FREQUENCY: 2x/week  SLP DURATION: 8 weeks  PLANNED INTERVENTIONS: Aspiration precaution training, Pharyngeal strengthening exercises, Diet toleration management , Environmental controls, Trials of upgraded texture/liquids, Internal/external aids, SLP instruction and feedback, Compensatory strategies, Patient/family education, [id number] Treatment of speech (30 or 45 min) , [id number] Treatment of swallowing function, and [id number]- Speech Eval Behavioral Qualitative Voice Resonance    [facility], CCC-SLP 01/15/2024, 12:24 PM

## 2024-01-24 ENCOUNTER — Ambulatory Visit

## 2024-01-24 DIAGNOSIS — R1313 Dysphagia, pharyngeal phase: Secondary | ICD-10-CM

## 2024-01-24 DIAGNOSIS — R499 Unspecified voice and resonance disorder: Secondary | ICD-10-CM

## 2024-01-24 NOTE — Therapy (Signed)
 OUTPATIENT SPEECH LANGUAGE PATHOLOGY SWALLOW TREATMENT   Patient Name: Bryan Hart MRN: [id number] DOB:Sep 01, 1950, 73 y.o., male Today's Date: 01/24/2024  PCP: [name], DO REFERRING PROVIDER: same as PCP  END OF SESSION:  End of Session - 01/24/24 1026     Visit Number 8    Number of Visits 17    Date for SLP Re-Evaluation 02/02/24    SLP Start Time 1020    SLP Stop Time  1100    SLP Time Calculation (min) 40 min    Activity Tolerance Patient tolerated treatment well              Past Medical History:  Diagnosis Date   Arthritis    HLD (hyperlipidemia)    Hypertension    dr  ed green      stress test   12 yrs ago   PVC's (premature ventricular contractions) 12 years ago   stress test done   Sleep apnea    Stroke [facility])    minor stroke 01/2018   Past Surgical History:  Procedure Laterality Date   ANKLE ARTHROSCOPY  06/27/2008   JOINT REPLACEMENT  06/27/2009   rt shoulder rotator cuff repair   knee surgeries  8030,8006   x2   SHOULDER HEMI-ARTHROPLASTY  01/12/2012   Procedure: SHOULDER HEMI-ARTHROPLASTY;  Surgeon: Eva Elsie Herring, MD;  Location[facility] OR;  Service: Orthopedics;  Laterality: Right;   SHOULDER SURGERY Right 07/2021   TOTAL KNEE ARTHROPLASTY Right 05/31/2022   Procedure: RIGHT TOTAL KNEE ARTHROPLASTY;  Surgeon: [name] Lonni GRADE, MD;  Location: [facility] OR;  Service: Orthopedics;  Laterality: Right;   Patient Active Problem List   Diagnosis Date Noted   Paroxysmal atrial fibrillation (HCC) 04/12/2023   Hypercoagulable state due to paroxysmal atrial fibrillation (HCC) 04/12/2023   Asymptomatic PVCs 07/26/2022   Hyperlipidemia 07/26/2022   Family history of heart disease 07/26/2022   Essential hypertension 07/26/2022   Status post total right knee replacement 05/31/2022   Unilateral primary osteoarthritis, left knee 03/28/2022   Unilateral primary osteoarthritis, right knee 03/28/2022   Ceruminosis, bilateral 08/18/2021   Primary  osteoarthritis of left knee 04/30/2020   Effusion, right knee 03/31/2020   Degenerative arthritis of right knee 03/31/2020   Bilateral primary osteoarthritis of knee 12/27/2018   AV block 07/29/2018   Central sleep apnea 07/29/2018   Cerebrovascular accident (HCC) 07/29/2018   Primary localized osteoarthrosis of shoulder region 01/13/2012    ONSET DATE: at least 5 years ago (Script dated 11/09/23)  REFERRING DIAG: Dysphagia, oropharyngeal phase  THERAPY DIAG:  Dysphagia, pharyngeal phase  Voice disorder  Rationale for Evaluation and Treatment: Rehabilitation  SUBJECTIVE:   SUBJECTIVE STATEMENT: Should I get the two swallows and then a sip out? (Pt, when told to get out his swallow exercises)  Pt accompanied by: self  PERTINENT HISTORY: CVA 2019, memory disorder. See above.  PAIN:  Are you having pain? No  FALLS: Has patient fallen in last 6 months?  No   PATIENT GOALS: Improve swallowing  OBJECTIVE:  Note: Objective measures were completed at Evaluation unless otherwise noted. OBJECTIVE:   DIAGNOSTIC FINDINGS:  NEUROIMAGING REPORT STUDY DATE: 07/26/2023 PATIENT NAME: Bryan Hart DOB: [birth year] MRN: [id number]   ORDERING CLINICIAN: Dr. [name] CLINICAL HISTORY: 73  yr old patient evaluated for memory loss COMPARISON FILMS: None EXAM: MRI brain with and without contrast TECHNIQUE: Sagittal T1, axial T1, T2, FLAIR, DWI, SWI, coronal T2 and postcontrast axial and coronal T1 images obtained through the brain CONTRAST: 8  mL IV gadopiclenol  IMAGING SITE: [place] [facility]   FINDINGS:  Brain parenchyma shows mild age-related changes of chronic small vessel disease and generalized cerebral atrophy.  No structural lesion, tumor or infarct is noted.  Diffusion-weighted imaging negative for acute ischemia.  SWI images do not show significant microhemorrhages.  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  Cortical sulci and gyri show normal appearance.  Extra-axial (but normal.  Calvarium shows no abnormalities.  Orbits appear unremarkable paranasal sinuses show mild chronic mucosal thickening.  The pituitary gland and cerebellar tonsils appear normal. The visualized portion of the upper cervical spine shows only minor degenerative changes.  Flow-voids of large vessels are intact and circulation appear to be patent.  Postcontrast images do not result in abnormal areas of enhancement.   IMPRESSION: MRI scan of the brain with and without contrast showing mild age-related changes of chronic small vessel disease and generalized cerebral atrophy.  HISTORY OF PRESENT ILLNESS - neurology December '24 UPDATE (06/12/23, VRP): Since last visit, more progression of symptoms. Decline in sense of direction. Sometimes does not recognize own wife. Memory decline has continued.  INSTRUMENTAL SWALLOW STUDY FINDINGS (MBSS) 11/01/23 Objective swallow impairments:  Pt demonstrates a moderate pharyngeal dysphagia with a DIGEST severity score of 1, characterized primarily by poor efficiency. Pt has relatively good oral control and timely swallow intiation. He has about 25% of residue remaining in vallculae post swallow (spills to pyriform) with solids and liquids. There is decreased base of tongue tension, decreased duration and ROM with laryngeal elevation and hyoid excursion. There is also an appearance of bony protrusions along the cervical spine partially impeding bolus flow through PES. Esophageal sweep showed barium table hesitate in multiple locations with retrograde flow of puree bolus given to aid in transit. Throughout study pt had only rare trace penetration to the vocal folds with intermittent throat clear response. Pt benefitted from a Mendelsohn maneuver to increase bolus propulsion and duration of PES opening. Pt cued to prolong swallow with increased  force. Quantity of residue was reduced, but not eliminated. Second swallow also needed, as well as following solids with liquids. Strongly encouraged pt to f/u with OP SLP to target both swallowing and voice. Pts hypophonia improved with cued for breath support. Pt to continue regular/thin diet.    Factors that may increase risk of adverse event in presence of aspiration Noe & Lianne 2021): Factors that may increase risk of adverse event in presence of aspiration Noe & Lianne 2021): Weak cough   DIGEST Swallow Severity Rating*             Safety:              Efficiency:             Overall Pharyngeal Swallow Severity:  1: mild; 2: moderate; 3: severe; 4: profound  *The Dynamic Imaging Grade of Swallowing Toxicity is standardized for the head and neck cancer population, however, demonstrates promising clinical applications across populations to standardize the clinical rating of pharyngeal swallow safety and severity.   Objective recommended compensations: Recommendations: PO diet PO Diet Recommendation: Regular; Thin liquids (Level 0) Liquid Administration via: Cup; Straw Medication Administration: Whole meds with puree Supervision: Patient able to self-feed Swallowing strategies  : Swallow hold (Mendelsohn maneuver); effortful swallow; Follow solids with liquids; Multiple dry swallows after each bite/sip Postural changes: Position pt fully upright for meals Oral care recommendations: Oral  care BID (2x/day)   PATIENT REPORTED OUTCOME MEASURES (PROM): EAT-10: pt scored himself 18/40 with lower scores indicating less impact of swallowing sx on QoL. 4 (severe problem) was answered for swallowing solids takes extra effort, the pleasure of eating is affected by my swallowing, and 3 was answered for swallowing pills takes extra effort, and swallowing is stressful.                                                                                                                              TREATMENT DATE:   01/24/24: Pt's speech measured today at average 71dB. SLP to continue to measure speech volume - loudness could be more dependent upon pt's comfort with the topic rather than a neurological etiology.  Pt req'd rare  min A with effortful, occasional min-mod A for tongue protrusion for Masako, and occasional min A for swallow hold with Mendelsohn. Fatigue noted after 6 reps of effortful, 6 reps of Masako, and 5 reps of Mendelsohn. With POs, pt req'd extra time consistently, and one cue for sip after swallows of a bite of cracker.   01/15/24: With HEP, pt demonstrated fatigue after 5-6 swallows of effortful and of Masako. Ultimately pt req'd rare min A with effortful, usual mod A for tongue protrusion with Masako, usual min A for holding swallow with Mendelsohn, Fatigue noted after 7 reps of Mendelsohn. Pt req'd initial min A however only req'd mod I and consistent extra time to achieve success with following precautions with two peanut butter crackers and water.  Wife states pt doing better both with eating and with HEP at home.  Pt's voice measured today at average 68dB, below WNL. SLP to consider adding loudness retraining tasks to pt's POC.   01/10/24: Pt req'd cues for taking exercises out and not precautions. Req'd SBA with effortful, usual min cues for tongue protrusion with Masako, and iniital mod A faded to independent with procedure with Mendelsohn. SLP noticed pt unable to track reps so SLP asked pt how he was tracking reps and he answered Pat. SLP encouraged him to attempt to make it a joint effort but suspect it will be better for wife to track and him focus on procedure.  With precautions, pt req'd initial assistance with sip after bite, but was independent with this with mod I (written cues) and extra time by session end. SLP verified that pt was not eating with distractions due to losing place with sequencing of precautions.   01/02/24: Pt and wife with questions re:  CTAR with towel. SLP told them to hold towel and for pt to put chin to floor and press tightly on towel. Pt was not holding towel, but relying on chin to hold towel. Pt and wife vocalized thanks for reiterating holding the towel. With precautions at home, pt is rarely using. SLP suggested no TV while eating - pt is watching with dinner and is using his homemade visual cues but SLP wondered  if simplification of this would be helpful. See pt instructions - using this sheet from SLP pt req'd extra time but was mod I with following precautions in quiet environment; SLP remarked this was the least SLP has had to cue pt since practicing precautions in session. Wife agreed. SLP suggested wife and pt take two crackers three times a day in addition to mealtimes for pt to practice precautions. SLP and wife had to cue pt to cont practicing with cracker bites, and cued once when pt asked did I double swallow? SLP shared s/sx aspiration PNA and with mod I pt able to reiterate these to SLP.  12/19/23: Pt described good days as ones where he remembers to do the exercises and they feel good.SABRA Describes a bad day when he does not do the exercises or the exercises are more difficult to accomplish (They don't go as smooth). Today pt req'd usual mod A for proper HEP completion. SLP brought pt's wife back to use as a second set of eyes to watch pt complete HEP at home and aid in memory of HEP details. She will do this - SLP suggested her watching and cueing pt until she no longer has to say anything when he performs HEP. Pt req'd written notes in order to complete swallow precautions today. Mod extra time req'd for the first 3 bites/sips and then min extra time req'd.   12/07/23: SLP provided pt with PROM today - see above for details. With crackers and water pt req'd usual mod cues for alternating solid-liquid and for multiple swallows. He was independent after initial min-mod cues for swallow hold 1/2-1 second. With HEP,  pt req'd usual mod A for all exercises faded to occasional min-mod A, except chin tuck with towel where cues faded to occasional min A. SLP strongly reiterated to pt to have Pat with him to keep him performing HEP correctly.   12/05/23: NEEDS PROM next session. Pt reports he is using written cues for following precautions at mealtimes. He is completing effortful swallow at home. SLP educated pt on procedure for Mendelsohn, Masako, and chin tuck against resistance (with towel). Pt req'd mod A and faded to mod I. Pt to complete x2/day with 15 reps each.   11/23/23: SLP reviewed results and recommendations of MBS with pt and wife. SLP taught pt effortful swallow, and attempted to teach pt Masako but was unable to do so due to time constraints. He was told to complete 15 reps effortful swallow BID. SLP suggested pt try Masako with 1-2 drops of water and if able to swallow with tongue out past lips he should add 15 reps BID to his HEP until next session. SLP will need to add Claudette and CTAR next session.   PATIENT EDUCATION: Education details: see treatment date for details Person educated: Patient Education method: Explanation, Demonstration, Verbal cues, and Handouts Education comprehension: verbalized understanding, returned demonstration, verbal cues required, and needs further education   ASSESSMENT:  CLINICAL IMPRESSION: Patient is a 73 y.o. M who was seen today for treatment of swallowing following MBS 11/01/23. See treatment date above for today's date for further details on today's session. Pt reported current sx are memory difficulty, reduced voice volume and strength, disordered gait pattern, left lingual, and labial weakness and bil decr'd velar, lingual, and labial ROM without notable dysarthria, and hypomimia which improved with cues (x2 separate instances) to exaggerate facial movements. Pt would like to participate in skilled ST for swallowing. Referring MD may want to  consider  referral for ENT due to pt's voice difficulties.  OBJECTIVE IMPAIRMENTS: include memory, voice disorder, and dysphagia. These impairments are limiting patient from household responsibilities, ADLs/IADLs, effectively communicating at home and in community, and safety when swallowing. Factors affecting potential to achieve goals and functional outcome are ability to learn/carryover information. Patient will benefit from skilled SLP services to address above impairments and improve overall function.  REHAB POTENTIAL: Good   GOALS: Goals reviewed with patient? Yes  SHORT TERM GOALS: Target date: 01/05/24 (due to first ST session 12/24/23)  Pt will complete HEP with rare min A Baseline:  Goal status: partially met  2.  Pt will follow swallowing precautions with rare min A Baseline:  Goal status: met  3.  Pt will tell SLP 3 overt s/sx aspiration PNA with mod I Baseline:  Goal status: Met   LONG TERM GOALS: Target date: 02/02/24   Pt will improve PROM Baseline:  Goal status: INITIAL  2.  Pt will complete HEP with mod I in 3 sessions Baseline:  Goal status: INITIAL  3.  Pt will follow swallowing precautions with mod I in 3 sessions Baseline: 01/15/24 Goal status: INITIAL   PLAN:  SLP FREQUENCY: 2x/week  SLP DURATION: 8 weeks  PLANNED INTERVENTIONS: Aspiration precaution training, Pharyngeal strengthening exercises, Diet toleration management , Environmental controls, Trials of upgraded texture/liquids, Internal/external aids, SLP instruction and feedback, Compensatory strategies, Patient/family education, [phone] Treatment of speech (30 or 45 min) , [id number] Treatment of swallowing function, and [id number]- Speech Eval Behavioral Qualitative Voice Resonance    [facility], CCC-SLP 01/24/2024, 10:27 AM

## 2024-01-31 ENCOUNTER — Ambulatory Visit: Attending: Internal Medicine

## 2024-01-31 DIAGNOSIS — R499 Unspecified voice and resonance disorder: Secondary | ICD-10-CM | POA: Insufficient documentation

## 2024-01-31 DIAGNOSIS — R1313 Dysphagia, pharyngeal phase: Secondary | ICD-10-CM | POA: Diagnosis not present

## 2024-01-31 NOTE — Therapy (Signed)
 OUTPATIENT SPEECH LANGUAGE PATHOLOGY SWALLOW TREATMENT   Patient Name: Bryan Hart MRN: [id number] DOB:09/08/1950, 73 y.o., male Today's Date: 01/31/2024  PCP: [name], DO REFERRING PROVIDER: same as PCP  END OF SESSION:  End of Session - 01/31/24 1055     Visit Number 9    Number of Visits 17    Date for SLP Re-Evaluation 02/02/24    SLP Start Time 1021    SLP Stop Time  1100    SLP Time Calculation (min) 39 min    Activity Tolerance Patient tolerated treatment well               Past Medical History:  Diagnosis Date   Arthritis    HLD (hyperlipidemia)    Hypertension    dr  ed green      stress test   12 yrs ago   PVC's (premature ventricular contractions) 12 years ago   stress test done   Sleep apnea    Stroke [facility])    minor stroke 01/2018   Past Surgical History:  Procedure Laterality Date   ANKLE ARTHROSCOPY  06/27/2008   JOINT REPLACEMENT  06/27/2009   rt shoulder rotator cuff repair   knee surgeries  8030,8006   x2   SHOULDER HEMI-ARTHROPLASTY  01/12/2012   Procedure: SHOULDER HEMI-ARTHROPLASTY;  Surgeon: Eva Elsie Herring, MD;  Location: [facility] OR;  Service: Orthopedics;  Laterality: Right;   SHOULDER SURGERY Right 07/2021   TOTAL KNEE ARTHROPLASTY Right 05/31/2022   Procedure: RIGHT TOTAL KNEE ARTHROPLASTY;  Surgeon: [name] Lonni GRADE, MD;  Location: [facility] OR;  Service: Orthopedics;  Laterality: Right;   Patient Active Problem List   Diagnosis Date Noted   Paroxysmal atrial fibrillation (HCC) 04/12/2023   Hypercoagulable state due to paroxysmal atrial fibrillation (HCC) 04/12/2023   Asymptomatic PVCs 07/26/2022   Hyperlipidemia 07/26/2022   Family history of heart disease 07/26/2022   Essential hypertension 07/26/2022   Status post total right knee replacement 05/31/2022   Unilateral primary osteoarthritis, left knee 03/28/2022   Unilateral primary osteoarthritis, right knee 03/28/2022   Ceruminosis, bilateral 08/18/2021   Primary  osteoarthritis of left knee 04/30/2020   Effusion, right knee 03/31/2020   Degenerative arthritis of right knee 03/31/2020   Bilateral primary osteoarthritis of knee 12/27/2018   AV block 07/29/2018   Central sleep apnea 07/29/2018   Cerebrovascular accident [facility]) 07/29/2018   Primary localized osteoarthrosis of shoulder region 01/13/2012    ONSET DATE: at least 5 years ago (Script dated 11/09/23)  REFERRING DIAG: Dysphagia, oropharyngeal phase  THERAPY DIAG:  Dysphagia, pharyngeal phase  Voice disorder  Rationale for Evaluation and Treatment: Rehabilitation  SUBJECTIVE:   SUBJECTIVE STATEMENT: Oh that's right I have to hard swallow twice after a bite.  Pt accompanied by: self  PERTINENT HISTORY: CVA 2019, memory disorder. See above.  PAIN:  Are you having pain? No  FALLS: Has patient fallen in last 6 months?  No   PATIENT GOALS: Improve swallowing  OBJECTIVE:  Note: Objective measures were completed at Evaluation unless otherwise noted. OBJECTIVE:   DIAGNOSTIC FINDINGS:  NEUROIMAGING REPORT STUDY DATE: 07/26/2023 PATIENT NAME: Bryan Hart DOB: 10/31/1950 MRN: [id number]   ORDERING CLINICIAN: Dr. [name] CLINICAL HISTORY: 73  yr old patient evaluated for memory loss COMPARISON FILMS: None EXAM: MRI brain with and without contrast TECHNIQUE: Sagittal T1, axial T1, T2, FLAIR, DWI, SWI, coronal T2 and postcontrast axial and coronal T1 images obtained through the brain CONTRAST: 8 mL IV gadopiclenol  IMAGING SITE: [facility]  FINDINGS:  Brain parenchyma shows mild age-related changes of chronic small vessel disease and generalized cerebral atrophy.  No structural lesion, tumor or infarct is noted.  Diffusion-weighted imaging negative for acute ischemia.  SWI images do not show significant microhemorrhages.  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  Cortical sulci and gyri show normal appearance.  Extra-axial (but normal.  Calvarium shows no abnormalities.  Orbits appear unremarkable paranasal sinuses show mild chronic mucosal thickening.  The pituitary gland and cerebellar tonsils appear normal. The visualized portion of the upper cervical spine shows only minor degenerative changes.  Flow-voids of large vessels are intact and circulation appear to be patent.  Postcontrast images do not result in abnormal areas of enhancement.   IMPRESSION: MRI scan of the brain with and without contrast showing mild age-related changes of chronic small vessel disease and generalized cerebral atrophy.  HISTORY OF PRESENT ILLNESS - neurology December '24 UPDATE (06/12/23, VRP): Since last visit, more progression of symptoms. Decline in sense of direction. Sometimes does not recognize own wife. Memory decline has continued.  INSTRUMENTAL SWALLOW STUDY FINDINGS (MBSS) 11/01/23 Objective swallow impairments:  Pt demonstrates a moderate pharyngeal dysphagia with a DIGEST severity score of 1, characterized primarily by poor efficiency. Pt has relatively good oral control and timely swallow intiation. He has about 25% of residue remaining in vallculae post swallow (spills to pyriform) with solids and liquids. There is decreased base of tongue tension, decreased duration and ROM with laryngeal elevation and hyoid excursion. There is also an appearance of bony protrusions along the cervical spine partially impeding bolus flow through PES. Esophageal sweep showed barium table hesitate in multiple locations with retrograde flow of puree bolus given to aid in transit. Throughout study pt had only rare trace penetration to the vocal folds with intermittent throat clear response. Pt benefitted from a Mendelsohn maneuver to increase bolus propulsion and duration of PES opening. Pt cued to prolong swallow with increased force. Quantity of residue was reduced, but not  eliminated. Second swallow also needed, as well as following solids with liquids. Strongly encouraged pt to f/u with OP SLP to target both swallowing and voice. Pts hypophonia improved with cued for breath support. Pt to continue regular/thin diet.    Factors that may increase risk of adverse event in presence of aspiration Noe & Lianne 2021): Factors that may increase risk of adverse event in presence of aspiration Noe & Lianne 2021): Weak cough   DIGEST Swallow Severity Rating*             Safety:              Efficiency:             Overall Pharyngeal Swallow Severity:  1: mild; 2: moderate; 3: severe; 4: profound  *The Dynamic Imaging Grade of Swallowing Toxicity is standardized for the head and neck cancer population, however, demonstrates promising clinical applications across populations to standardize the clinical rating of pharyngeal swallow safety and severity.   Objective recommended compensations: Recommendations: PO diet PO Diet Recommendation: Regular; Thin liquids (Level 0) Liquid Administration via: Cup; Straw Medication Administration: Whole meds with puree Supervision: Patient able to self-feed Swallowing strategies  : Swallow hold (Mendelsohn maneuver); effortful swallow; Follow solids with liquids; Multiple dry swallows after each bite/sip Postural changes: Position pt fully upright for meals Oral care recommendations: Oral care BID (2x/day)   PATIENT REPORTED OUTCOME MEASURES (  PROM): EAT-10: pt scored himself 18/40 with lower scores indicating less impact of swallowing sx on QoL. 4 (severe problem) was answered for swallowing solids takes extra effort, the pleasure of eating is affected by my swallowing, and 3 was answered for swallowing pills takes extra effort, and swallowing is stressful.                                                                                                                             TREATMENT DATE:   01/31/24: SLP to add  PhoRTE to pt's regimen next session. SLP learned pt has not been performing HEP to frequency or # reps prescribed. SLP strongly encouraged pt/wife to complete HEP x2/day 15 reps each exercise, at least 4 days/week with occasional 5th day/week. SLP will aim to schedule retest of MBS week of 03/04/24. With HEP pt req'd usual min A initially for mendelsohn, effortful, and chin tuck with towel, and usual min a for entirety of Masako for tongue protrusion. With POs, pt req'd cue once for double hard swallow with bite before taking a sip (he wanted to take a sip after a bite of cracker). Pt stated he was not using swallow precautions for meals and SLP wrote a reminder on the sheet to ensure this occurs.   01/24/24: Pt's speech measured today at average 71dB. SLP to continue to measure speech volume - loudness could be more dependent upon pt's comfort with the topic rather than a neurological etiology.  Pt req'd rare  min A with effortful, occasional min-mod A for tongue protrusion for Masako, and occasional min A for swallow hold with Mendelsohn. Fatigue noted after 6 reps of effortful, 6 reps of Masako, and 5 reps of Mendelsohn. With POs, pt req'd extra time consistently, and one cue for sip after swallows of a bite of cracker.   01/15/24: With HEP, pt demonstrated fatigue after 5-6 swallows of effortful and of Masako. Ultimately pt req'd rare min A with effortful, usual mod A for tongue protrusion with Masako, usual min A for holding swallow with Mendelsohn, Fatigue noted after 7 reps of Mendelsohn. Pt req'd initial min A however only req'd mod I and consistent extra time to achieve success with following precautions with two peanut butter crackers and water.  Wife states pt doing better both with eating and with HEP at home.  Pt's voice measured today at average 68dB, below WNL. SLP to consider adding loudness retraining tasks to pt's POC.   01/10/24: Pt req'd cues for taking exercises out and not precautions.  Req'd SBA with effortful, usual min cues for tongue protrusion with Masako, and iniital mod A faded to independent with procedure with Mendelsohn. SLP noticed pt unable to track reps so SLP asked pt how he was tracking reps and he answered Pat. SLP encouraged him to attempt to make it a joint effort but suspect it will be better for wife to track and him focus on procedure.  With precautions,  pt req'd initial assistance with sip after bite, but was independent with this with mod I (written cues) and extra time by session end. SLP verified that pt was not eating with distractions due to losing place with sequencing of precautions.   01/02/24: Pt and wife with questions re: CTAR with towel. SLP told them to hold towel and for pt to put chin to floor and press tightly on towel. Pt was not holding towel, but relying on chin to hold towel. Pt and wife vocalized thanks for reiterating holding the towel. With precautions at home, pt is rarely using. SLP suggested no TV while eating - pt is watching with dinner and is using his homemade visual cues but SLP wondered if simplification of this would be helpful. See pt instructions - using this sheet from SLP pt req'd extra time but was mod I with following precautions in quiet environment; SLP remarked this was the least SLP has had to cue pt since practicing precautions in session. Wife agreed. SLP suggested wife and pt take two crackers three times a day in addition to mealtimes for pt to practice precautions. SLP and wife had to cue pt to cont practicing with cracker bites, and cued once when pt asked did I double swallow? SLP shared s/sx aspiration PNA and with mod I pt able to reiterate these to SLP.  12/19/23: Pt described good days as ones where he remembers to do the exercises and they feel good.SABRA Describes a bad day when he does not do the exercises or the exercises are more difficult to accomplish (They don't go as smooth). Today pt req'd usual mod A  for proper HEP completion. SLP brought pt's wife back to use as a second set of eyes to watch pt complete HEP at home and aid in memory of HEP details. She will do this - SLP suggested her watching and cueing pt until she no longer has to say anything when he performs HEP. Pt req'd written notes in order to complete swallow precautions today. Mod extra time req'd for the first 3 bites/sips and then min extra time req'd.   12/07/23: SLP provided pt with PROM today - see above for details. With crackers and water pt req'd usual mod cues for alternating solid-liquid and for multiple swallows. He was independent after initial min-mod cues for swallow hold 1/2-1 second. With HEP, pt req'd usual mod A for all exercises faded to occasional min-mod A, except chin tuck with towel where cues faded to occasional min A. SLP strongly reiterated to pt to have Pat with him to keep him performing HEP correctly.   12/05/23: NEEDS PROM next session. Pt reports he is using written cues for following precautions at mealtimes. He is completing effortful swallow at home. SLP educated pt on procedure for Mendelsohn, Masako, and chin tuck against resistance (with towel). Pt req'd mod A and faded to mod I. Pt to complete x2/day with 15 reps each.   11/23/23: SLP reviewed results and recommendations of MBS with pt and wife. SLP taught pt effortful swallow, and attempted to teach pt Masako but was unable to do so due to time constraints. He was told to complete 15 reps effortful swallow BID. SLP suggested pt try Masako with 1-2 drops of water and if able to swallow with tongue out past lips he should add 15 reps BID to his HEP until next session. SLP will need to add Claudette and CTAR next session.   PATIENT EDUCATION: Education details:  see treatment date for details Person educated: Patient Education method: Explanation, Demonstration, Verbal cues, and Handouts Education comprehension: verbalized understanding, returned  demonstration, verbal cues required, and needs further education   ASSESSMENT:  CLINICAL IMPRESSION: Patient is a 73 y.o. M who was seen today for treatment of swallowing following MBS 11/01/23. See treatment date above for today's date for further details on today's session. Pt reported current sx are memory difficulty, reduced voice volume and strength, disordered gait pattern, left lingual, and labial weakness and bil decr'd velar, lingual, and labial ROM without notable dysarthria, and hypomimia which improved with cues (x2 separate instances) to exaggerate facial movements. Pt would like to participate in skilled ST for swallowing.   OBJECTIVE IMPAIRMENTS: include memory, voice disorder, and dysphagia. These impairments are limiting patient from household responsibilities, ADLs/IADLs, effectively communicating at home and in community, and safety when swallowing. Factors affecting potential to achieve goals and functional outcome are ability to learn/carryover information. Patient will benefit from skilled SLP services to address above impairments and improve overall function.  REHAB POTENTIAL: Good   GOALS: Goals reviewed with patient? Yes  SHORT TERM GOALS: Target date: 01/05/24 (due to first ST session 12/24/23)  Pt will complete HEP with rare min A Baseline:  Goal status: partially met  2.  Pt will follow swallowing precautions with rare min A Baseline:  Goal status: met  3.  Pt will tell SLP 3 overt s/sx aspiration PNA with mod I Baseline:  Goal status: Met   LONG TERM GOALS: Target date: 02/02/24   Pt will improve PROM Baseline:  Goal status: INITIAL  2.  Pt will complete HEP with mod I in 3 sessions Baseline:  Goal status: INITIAL  3.  Pt will follow swallowing precautions with mod I in 3 sessions Baseline: 01/15/24 Goal status: INITIAL   PLAN:  SLP FREQUENCY: 2x/week  SLP DURATION: 8 weeks  PLANNED INTERVENTIONS: Aspiration precaution training, Pharyngeal  strengthening exercises, Diet toleration management , Environmental controls, Trials of upgraded texture/liquids, Internal/external aids, SLP instruction and feedback, Compensatory strategies, Patient/family education, [phone] Treatment of speech (30 or 45 min) , 92526 Treatment of swallowing function, and [id number]- Speech Eval Behavioral Qualitative Voice Resonance    [facility], CCC-SLP 01/31/2024, 10:55 AM

## 2024-02-05 ENCOUNTER — Other Ambulatory Visit (INDEPENDENT_AMBULATORY_CARE_PROVIDER_SITE_OTHER): Payer: Self-pay

## 2024-02-05 ENCOUNTER — Ambulatory Visit: Admitting: Orthopaedic Surgery

## 2024-02-05 ENCOUNTER — Ambulatory Visit

## 2024-02-05 DIAGNOSIS — R499 Unspecified voice and resonance disorder: Secondary | ICD-10-CM

## 2024-02-05 DIAGNOSIS — G8929 Other chronic pain: Secondary | ICD-10-CM

## 2024-02-05 DIAGNOSIS — R1313 Dysphagia, pharyngeal phase: Secondary | ICD-10-CM | POA: Diagnosis not present

## 2024-02-05 DIAGNOSIS — M25562 Pain in left knee: Secondary | ICD-10-CM | POA: Diagnosis not present

## 2024-02-05 NOTE — Progress Notes (Signed)
 The patient is a 73 year old gentleman well-known to us .  We replaced his right knee secondary to severe arthritis back in December 2023.  Even at that time he had severe arthritis in his left knee.  He is on Eliquis  now due to A-fib.  He does see his specialist on a regular basis.  His wife is with him today.  He does have some dementia and there is concerned about his recovery from anesthesia for a left knee replacement.  When we replaced his right knee this was done under a regional block, spinal anesthesia and local.  He still had some cognitive issues after surgery and she is passed that along and would even discussed that at the time of surgery for his left knee.  Examination of his left knee today shows significant varus malalignment.  There is a large effusion and I was able to aspirate 30 cc of fluid off of his knee.  He has pain throughout the arc of motion and pain in all 3 compartments.  X-rays of the left knee today show worsening tricompartment arthritis that is bone-on-bone of basically all 3 compartments with osteophytes in all 3 compartments and varus malalignment.  Again we had a long and thorough discussion about knee replacement surgery for his left knee.  Will work on getting him on the schedule and this would can hopefully still be done under spinal anesthesia as well as a block and local.  He is on Eliquis  so he will need to stop this for 3 full days prior to that surgery.  All question concerns were answered and addressed.

## 2024-02-05 NOTE — Patient Instructions (Signed)
   PHoRTE VOICE EXERCISES - DO TWICE EACH DAY  1) Hold AHHHHHHHHHHHHH 10 times   As long as you can as strong as you can - push from your abdominal muscles!   [email or web address]  2) Count 1-10 (skip 7)   Start at as low pitch as you can, glide to as high pitch as you can, then back down as low as you can   [email or web address]  3) Say 10 sentences -two different ways   (a) like you are calling over the fence to your neighbor in a higher-pitch voice  (b) like you are scolding your dog in a LOW authoritative voice   [email or web address]

## 2024-02-05 NOTE — Therapy (Signed)
 OUTPATIENT SPEECH LANGUAGE PATHOLOGY TREATMENT/PROGRESS NOTE   Patient Name: Bryan Hart MRN: [id number] DOB:13-Nov-1950, 73 y.o., male Today's Date: 02/05/2024  PCP: [name], DO REFERRING PROVIDER: same as PCP  END OF SESSION:  End of Session - 02/05/24 1320     Visit Number 10    Number of Visits 17    Date for SLP Re-Evaluation 02/02/24    SLP Start Time 1317    Activity Tolerance Patient tolerated treatment well               Past Medical History:  Diagnosis Date   Arthritis    HLD (hyperlipidemia)    Hypertension    dr  ed green      stress test   12 yrs ago   PVC's (premature ventricular contractions) 12 years ago   stress test done   Sleep apnea    Stroke [facility])    minor stroke 01/2018   Past Surgical History:  Procedure Laterality Date   ANKLE ARTHROSCOPY  06/27/2008   JOINT REPLACEMENT  06/27/2009   rt shoulder rotator cuff repair   knee surgeries  [id number]   x2   SHOULDER HEMI-ARTHROPLASTY  01/12/2012   Procedure: SHOULDER HEMI-ARTHROPLASTY;  Surgeon: Eva Elsie Herring, MD;  Location: [facility] OR;  Service: Orthopedics;  Laterality: Right;   SHOULDER SURGERY Right 07/2021   TOTAL KNEE ARTHROPLASTY Right 05/31/2022   Procedure: RIGHT TOTAL KNEE ARTHROPLASTY;  Surgeon: [name] Lonni GRADE, MD;  Location: [facility] OR;  Service: Orthopedics;  Laterality: Right;   Patient Active Problem List   Diagnosis Date Noted   Paroxysmal atrial fibrillation (HCC) 04/12/2023   Hypercoagulable state due to paroxysmal atrial fibrillation (HCC) 04/12/2023   Asymptomatic PVCs 07/26/2022   Hyperlipidemia 07/26/2022   Family history of heart disease 07/26/2022   Essential hypertension 07/26/2022   Status post total right knee replacement 05/31/2022   Unilateral primary osteoarthritis, left knee 03/28/2022   Unilateral primary osteoarthritis, right knee 03/28/2022   Ceruminosis, bilateral 08/18/2021   Primary osteoarthritis of left knee 04/30/2020   Effusion,  right knee 03/31/2020   Degenerative arthritis of right knee 03/31/2020   Bilateral primary osteoarthritis of knee 12/27/2018   AV block 07/29/2018   Central sleep apnea 07/29/2018   Cerebrovascular accident [facility]) 07/29/2018   Primary localized osteoarthrosis of shoulder region 01/13/2012    Speech Therapy Progress Note  Dates of Reporting Period: 11/23/23 to present  Subjective Statement: Pt has been seen for 10 ST sessions focus mainly on swallowing. SLP to also introduce PhoRTE exercises in next 1-2 sessions for decr'd voice volume.  Objective: See below.   Goal Update: See below  Plan: See below. Begin PhoRTE today for lower than WNL voice volume. If no change in 6 weeks, SLP to ask referring MD about possibility of ENT referral.  Reason Skilled Services are Required: Pt not reached max rehab potential at this time. Has just begun PhoRTE.   ONSET DATE: at least 5 years ago (Script dated 11/09/23)  REFERRING DIAG: Dysphagia, oropharyngeal phase  THERAPY DIAG:  Dysphagia, pharyngeal phase  Voice disorder  Rationale for Evaluation and Treatment: Rehabilitation  SUBJECTIVE:   SUBJECTIVE STATEMENT: I got caught up in TV while I was eating lunch. SLP reminded pt that SLP has suggested not eating lunch with TV on so he is not distracted from completing swallow precautions.  Pt accompanied by: self  PERTINENT HISTORY: CVA 2019, memory disorder. See above.  PAIN:  Are you having pain? No  FALLS: Has  patient fallen in last 6 months?  No   PATIENT GOALS: Improve swallowing  OBJECTIVE:  Note: Objective measures were completed at Evaluation unless otherwise noted. OBJECTIVE:   DIAGNOSTIC FINDINGS:  NEUROIMAGING REPORT STUDY DATE: 07/26/2023 PATIENT NAME: Bryan Hart DOB: December 30, 1950 MRN: [id number]   ORDERING CLINICIAN: Dr. [name] CLINICAL HISTORY: 73  yr old patient evaluated for memory loss COMPARISON FILMS: None EXAM: MRI brain with and without  contrast TECHNIQUE: Sagittal T1, axial T1, T2, FLAIR, DWI, SWI, coronal T2 and postcontrast axial and coronal T1 images obtained through the brain CONTRAST: 8 mL IV gadopiclenol  IMAGING SITE: [facility]   FINDINGS:  Brain parenchyma shows mild age-related changes of chronic small vessel disease and generalized cerebral atrophy.  No structural lesion, tumor or infarct is noted.  Diffusion-weighted imaging negative for acute ischemia.  SWI images do not show significant microhemorrhages.  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  Cortical sulci and gyri show normal appearance.  Extra-axial (but normal.  Calvarium shows no abnormalities.  Orbits appear unremarkable paranasal sinuses show mild chronic mucosal thickening.  The pituitary gland and cerebellar tonsils appear normal. The visualized portion of the upper cervical spine shows only minor degenerative changes.  Flow-voids of large vessels are intact and circulation appear to be patent.  Postcontrast images do not result in abnormal areas of enhancement.   IMPRESSION: MRI scan of the brain with and without contrast showing mild age-related changes of chronic small vessel disease and generalized cerebral atrophy.  HISTORY OF PRESENT ILLNESS - neurology December '24 UPDATE (06/12/23, VRP): Since last visit, more progression of symptoms. Decline in sense of direction. Sometimes does not recognize own wife. Memory decline has continued.  INSTRUMENTAL SWALLOW STUDY FINDINGS (MBSS) 11/01/23 Objective swallow impairments:  Pt demonstrates a moderate pharyngeal dysphagia with a DIGEST severity score of 1, characterized primarily by poor efficiency. Pt has relatively good oral control and timely swallow intiation. He has about 25% of residue remaining in vallculae post swallow (spills to pyriform) with solids and liquids. There is  decreased base of tongue tension, decreased duration and ROM with laryngeal elevation and hyoid excursion. There is also an appearance of bony protrusions along the cervical spine partially impeding bolus flow through PES. Esophageal sweep showed barium table hesitate in multiple locations with retrograde flow of puree bolus given to aid in transit. Throughout study pt had only rare trace penetration to the vocal folds with intermittent throat clear response. Pt benefitted from a Mendelsohn maneuver to increase bolus propulsion and duration of PES opening. Pt cued to prolong swallow with increased force. Quantity of residue was reduced, but not eliminated. Second swallow also needed, as well as following solids with liquids. Strongly encouraged pt to f/u with OP SLP to target both swallowing and voice. Pts hypophonia improved with cued for breath support. Pt to continue regular/thin diet.    Factors that may increase risk of adverse event in presence of aspiration Noe & Lianne 2021): Factors that may increase risk of adverse event in presence of aspiration Noe & Lianne 2021): Weak cough   DIGEST Swallow Severity Rating*             Safety:              Efficiency:             Overall Pharyngeal Swallow Severity:  1: mild; 2: moderate; 3: severe; 4: profound  *  The Dynamic Imaging Grade of Swallowing Toxicity is standardized for the head and neck cancer population, however, demonstrates promising clinical applications across populations to standardize the clinical rating of pharyngeal swallow safety and severity.   Objective recommended compensations: Recommendations: PO diet PO Diet Recommendation: Regular; Thin liquids (Level 0) Liquid Administration via: Cup; Straw Medication Administration: Whole meds with puree Supervision: Patient able to self-feed Swallowing strategies  : Swallow hold (Mendelsohn maneuver); effortful swallow; Follow solids with liquids; Multiple dry swallows after  each bite/sip Postural changes: Position pt fully upright for meals Oral care recommendations: Oral care BID (2x/day)   PATIENT REPORTED OUTCOME MEASURES (PROM): EAT-10: pt scored himself 18/40 with lower scores indicating less impact of swallowing sx on QoL. 4 (severe problem) was answered for swallowing solids takes extra effort, the pleasure of eating is affected by my swallowing, and 3 was answered for swallowing pills takes extra effort, and swallowing is stressful.                                                                                                                             TREATMENT DATE:   02/05/24: SWALLOW: SLP reminded pt that he should not eat and watch TV and explained rationale (See S). He has been more consistent with adhering to precautions with all POs since last visit.  Today with precautions pt req'd SBA with double swallow with solids before a sip.  SPEECH: PhoRTE exercises introduced today. SLP brought Pat back to go through exercises with pt at home should he not recall how to complete.  Average /a/ 86dB, 15 seconds; average loudness with numbers - 84dB, with occasional min A for pitch variation in higher frequencies; with high pitched sentences initial mod cue for higher pitch, and one cue for loudness after 4 reps, average 84dB. Lower pitched sentences, occasional mod cues for loudness faded to min cues, average 84dB. Cues necessary for lower pitch voice after 6 reps. If little progress with voice volumle after 4-6 weeks, SLP may recommend ENT referral to referring MD. Pt req'd consistent mod cues with models intermittently during this task. Pat told SLP she was comfortable assisting pt at home.   01/31/24: SLP to add PhoRTE to pt's regimen next session. SLP learned pt has not been performing HEP to frequency or # reps prescribed. SLP strongly encouraged pt/wife to complete HEP x2/day 15 reps each exercise, at least 4 days/week with occasional 5th day/week. SLP  will aim to schedule retest of MBS week of 03/04/24. With HEP pt req'd usual min A initially for mendelsohn, effortful, and chin tuck with towel, and usual min a for entirety of Masako for tongue protrusion. With POs, pt req'd cue once for double hard swallow with bite before taking a sip (he wanted to take a sip after a bite of cracker). Pt stated he was not using swallow precautions for meals and SLP wrote a reminder on the sheet to ensure this occurs.   01/24/24:  Pt's speech measured today at average 71dB. SLP to continue to measure speech volume - loudness could be more dependent upon pt's comfort with the topic rather than a neurological etiology.  Pt req'd rare  min A with effortful, occasional min-mod A for tongue protrusion for Masako, and occasional min A for swallow hold with Mendelsohn. Fatigue noted after 6 reps of effortful, 6 reps of Masako, and 5 reps of Mendelsohn. With POs, pt req'd extra time consistently, and one cue for sip after swallows of a bite of cracker.   01/15/24: With HEP, pt demonstrated fatigue after 5-6 swallows of effortful and of Masako. Ultimately pt req'd rare min A with effortful, usual mod A for tongue protrusion with Masako, usual min A for holding swallow with Mendelsohn, Fatigue noted after 7 reps of Mendelsohn. Pt req'd initial min A however only req'd mod I and consistent extra time to achieve success with following precautions with two peanut butter crackers and water.  Wife states pt doing better both with eating and with HEP at home.  Pt's voice measured today at average 68dB, below WNL. SLP to consider adding loudness retraining tasks to pt's POC.   01/10/24: Pt req'd cues for taking exercises out and not precautions. Req'd SBA with effortful, usual min cues for tongue protrusion with Masako, and iniital mod A faded to independent with procedure with Mendelsohn. SLP noticed pt unable to track reps so SLP asked pt how he was tracking reps and he answered Pat.  SLP encouraged him to attempt to make it a joint effort but suspect it will be better for wife to track and him focus on procedure.  With precautions, pt req'd initial assistance with sip after bite, but was independent with this with mod I (written cues) and extra time by session end. SLP verified that pt was not eating with distractions due to losing place with sequencing of precautions.   01/02/24: Pt and wife with questions re: CTAR with towel. SLP told them to hold towel and for pt to put chin to floor and press tightly on towel. Pt was not holding towel, but relying on chin to hold towel. Pt and wife vocalized thanks for reiterating holding the towel. With precautions at home, pt is rarely using. SLP suggested no TV while eating - pt is watching with dinner and is using his homemade visual cues but SLP wondered if simplification of this would be helpful. See pt instructions - using this sheet from SLP pt req'd extra time but was mod I with following precautions in quiet environment; SLP remarked this was the least SLP has had to cue pt since practicing precautions in session. Wife agreed. SLP suggested wife and pt take two crackers three times a day in addition to mealtimes for pt to practice precautions. SLP and wife had to cue pt to cont practicing with cracker bites, and cued once when pt asked did I double swallow? SLP shared s/sx aspiration PNA and with mod I pt able to reiterate these to SLP.  12/19/23: Pt described good days as ones where he remembers to do the exercises and they feel good.SABRA Describes a bad day when he does not do the exercises or the exercises are more difficult to accomplish (They don't go as smooth). Today pt req'd usual mod A for proper HEP completion. SLP brought pt's wife back to use as a second set of eyes to watch pt complete HEP at home and aid in memory of HEP details. She  will do this - SLP suggested her watching and cueing pt until she no longer has to say  anything when he performs HEP. Pt req'd written notes in order to complete swallow precautions today. Mod extra time req'd for the first 3 bites/sips and then min extra time req'd.   12/07/23: SLP provided pt with PROM today - see above for details. With crackers and water pt req'd usual mod cues for alternating solid-liquid and for multiple swallows. He was independent after initial min-mod cues for swallow hold 1/2-1 second. With HEP, pt req'd usual mod A for all exercises faded to occasional min-mod A, except chin tuck with towel where cues faded to occasional min A. SLP strongly reiterated to pt to have Pat with him to keep him performing HEP correctly.   12/05/23: NEEDS PROM next session. Pt reports he is using written cues for following precautions at mealtimes. He is completing effortful swallow at home. SLP educated pt on procedure for Mendelsohn, Masako, and chin tuck against resistance (with towel). Pt req'd mod A and faded to mod I. Pt to complete x2/day with 15 reps each.   11/23/23: SLP reviewed results and recommendations of MBS with pt and wife. SLP taught pt effortful swallow, and attempted to teach pt Masako but was unable to do so due to time constraints. He was told to complete 15 reps effortful swallow BID. SLP suggested pt try Masako with 1-2 drops of water and if able to swallow with tongue out past lips he should add 15 reps BID to his HEP until next session. SLP will need to add Claudette and CTAR next session.   PATIENT EDUCATION: Education details: see treatment date for details Person educated: Patient Education method: Explanation, Demonstration, Verbal cues, and Handouts Education comprehension: verbalized understanding, returned demonstration, verbal cues required, and needs further education   ASSESSMENT:  CLINICAL IMPRESSION: Patient is a 73 y.o. M who was seen today for treatment of swallowing and following MBS 11/01/23, and weaker voice. See treatment date above  for today's date for further details on today's session. SLP added PhoRTE to pt's exercise regimen today. Goal added. Pt reported current sx are memory difficulty, reduced voice volume and strength, disordered gait pattern, left lingual, and labial weakness and bil decr'd velar, lingual, and labial ROM without notable dysarthria, and hypomimia which improved with cues (x2 separate instances) to exaggerate facial movements. Pt would like to participate in skilled ST for swallowing.   OBJECTIVE IMPAIRMENTS: include memory, voice disorder, and dysphagia. These impairments are limiting patient from household responsibilities, ADLs/IADLs, effectively communicating at home and in community, and safety when swallowing. Factors affecting potential to achieve goals and functional outcome are ability to learn/carryover information. Patient will benefit from skilled SLP services to address above impairments and improve overall function.  REHAB POTENTIAL: Good   GOALS: Goals reviewed with patient? Yes  SHORT TERM GOALS: Target date: 01/05/24 (due to first ST session 12/24/23)  Pt will complete HEP with rare min A Baseline:  Goal status: partially met  2.  Pt will follow swallowing precautions with rare min A Baseline:  Goal status: met  3.  Pt will tell SLP 3 overt s/sx aspiration PNA with mod I Baseline:  Goal status: Met   LONG TERM GOALS: Target date: 02/02/24   Pt will improve PROM Baseline:  Goal status: INITIAL  2.  Pt will complete HEP with mod I in 3 sessions Baseline:  Goal status: INITIAL  3.  Pt will follow swallowing  precautions with mod I in 3 sessions Baseline: 01/15/24 Goal status: INITIAL  4.  Pt will complete PhoRTE exercises with rare min A Baseline:  Goal status: INITIAL  PLAN:  SLP FREQUENCY: 2x/week  SLP DURATION: 8 weeks  PLANNED INTERVENTIONS: Aspiration precaution training, Pharyngeal strengthening exercises, Diet toleration management , Environmental  controls, Trials of upgraded texture/liquids, Internal/external aids, SLP instruction and feedback, Compensatory strategies, Patient/family education, [phone] Treatment of speech (30 or 45 min) , [id number] Treatment of swallowing function, and [id number]- Speech Eval Behavioral Qualitative Voice Resonance    [facility], CCC-SLP 02/05/2024, 1:20 PM

## 2024-02-12 ENCOUNTER — Ambulatory Visit

## 2024-02-12 DIAGNOSIS — R499 Unspecified voice and resonance disorder: Secondary | ICD-10-CM

## 2024-02-12 DIAGNOSIS — R1313 Dysphagia, pharyngeal phase: Secondary | ICD-10-CM

## 2024-02-12 NOTE — Therapy (Signed)
 OUTPATIENT SPEECH LANGUAGE PATHOLOGY TREATMENT   Patient Name: Bryan Hart MRN: [id number] DOB:Apr 09, 1951, 73 y.o., male Today's Date: 02/12/2024  PCP: [name], DO REFERRING PROVIDER: same as PCP  END OF SESSION:  End of Session - 02/12/24 1032     Visit Number 11    Number of Visits 17    Date for SLP Re-Evaluation 03/15/24    SLP Start Time 1020    SLP Stop Time  1100    SLP Time Calculation (min) 40 min    Activity Tolerance Patient tolerated treatment well               Past Medical History:  Diagnosis Date   Arthritis    HLD (hyperlipidemia)    Hypertension    dr  ed green      stress test   12 yrs ago   PVC's (premature ventricular contractions) 12 years ago   stress test done   Sleep apnea    Stroke [facility])    minor stroke 01/2018   Past Surgical History:  Procedure Laterality Date   ANKLE ARTHROSCOPY  06/27/2008   JOINT REPLACEMENT  06/27/2009   rt shoulder rotator cuff repair   knee surgeries  8030,8006   x2   SHOULDER HEMI-ARTHROPLASTY  01/12/2012   Procedure: SHOULDER HEMI-ARTHROPLASTY;  Surgeon: Eva Elsie Herring, MD;  Location: [facility] OR;  Service: Orthopedics;  Laterality: Right;   SHOULDER SURGERY Right 07/2021   TOTAL KNEE ARTHROPLASTY Right 05/31/2022   Procedure: RIGHT TOTAL KNEE ARTHROPLASTY;  Surgeon: [name] Lonni GRADE, MD;  Location: [facility] OR;  Service: Orthopedics;  Laterality: Right;   Patient Active Problem List   Diagnosis Date Noted   Paroxysmal atrial fibrillation (HCC) 04/12/2023   Hypercoagulable state due to paroxysmal atrial fibrillation (HCC) 04/12/2023   Asymptomatic PVCs 07/26/2022   Hyperlipidemia 07/26/2022   Family history of heart disease 07/26/2022   Essential hypertension 07/26/2022   Status post total right knee replacement 05/31/2022   Unilateral primary osteoarthritis, left knee 03/28/2022   Unilateral primary osteoarthritis, right knee 03/28/2022   Ceruminosis, bilateral 08/18/2021   Primary  osteoarthritis of left knee 04/30/2020   Effusion, right knee 03/31/2020   Degenerative arthritis of right knee 03/31/2020   Bilateral primary osteoarthritis of knee 12/27/2018   AV block 07/29/2018   Central sleep apnea 07/29/2018   Cerebrovascular accident [facility]) 07/29/2018   Primary localized osteoarthrosis of shoulder region 01/13/2012      ONSET DATE: at least 5 years ago (Script dated 11/09/23)  REFERRING DIAG: Dysphagia, oropharyngeal phase  THERAPY DIAG:  Dysphagia, pharyngeal phase  Voice disorder  Rationale for Evaluation and Treatment: Rehabilitation  SUBJECTIVE:   SUBJECTIVE STATEMENT: I think it's a bit of both (stubbornness not following swallow precautions, and  forgetting to follow precautions with POs).  Pt accompanied by: self  PERTINENT HISTORY: CVA 2019, memory disorder. See above.  PAIN:  Are you having pain? No  FALLS: Has patient fallen in last 6 months?  No   PATIENT GOALS: Improve swallowing  OBJECTIVE:  Note: Objective measures were completed at Evaluation unless otherwise noted. OBJECTIVE:   DIAGNOSTIC FINDINGS:  NEUROIMAGING REPORT STUDY DATE: 07/26/2023 PATIENT NAME: Bryan Hart DOB: 09/03/1950 MRN: [id number]   ORDERING CLINICIAN: Dr. [name] CLINICAL HISTORY: 73  yr old patient evaluated for memory loss COMPARISON FILMS: None EXAM: MRI brain with and without contrast TECHNIQUE: Sagittal T1, axial T1, T2, FLAIR, DWI, SWI, coronal T2 and postcontrast axial and coronal T1 images obtained through the brain  CONTRAST: 8 mL IV gadopiclenol  IMAGING SITE: [facility]   FINDINGS:  Brain parenchyma shows mild age-related changes of chronic small vessel disease and generalized cerebral atrophy.  No structural lesion, tumor or infarct is noted.  Diffusion-weighted imaging negative for acute ischemia.  SWI images do not show significant microhemorrhages.  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  Cortical sulci and gyri show normal appearance.  Extra-axial (but normal.  Calvarium shows no abnormalities.  Orbits appear unremarkable paranasal sinuses show mild chronic mucosal thickening.  The pituitary gland and cerebellar tonsils appear normal. The visualized portion of the upper cervical spine shows only minor degenerative changes.  Flow-voids of large vessels are intact and circulation appear to be patent.  Postcontrast images do not result in abnormal areas of enhancement.   IMPRESSION: MRI scan of the brain with and without contrast showing mild age-related changes of chronic small vessel disease and generalized cerebral atrophy.  HISTORY OF PRESENT ILLNESS - neurology December '24 UPDATE (06/12/23, VRP): Since last visit, more progression of symptoms. Decline in sense of direction. Sometimes does not recognize own wife. Memory decline has continued.  INSTRUMENTAL SWALLOW STUDY FINDINGS (MBSS) 11/01/23 Objective swallow impairments:  Pt demonstrates a moderate pharyngeal dysphagia with a DIGEST severity score of 1, characterized primarily by poor efficiency. Pt has relatively good oral control and timely swallow intiation. He has about 25% of residue remaining in vallculae post swallow (spills to pyriform) with solids and liquids. There is decreased base of tongue tension, decreased duration and ROM with laryngeal elevation and hyoid excursion. There is also an appearance of bony protrusions along the cervical spine partially impeding bolus flow through PES. Esophageal sweep showed barium table hesitate in multiple locations with retrograde flow of puree bolus given to aid in transit. Throughout study pt had only rare trace penetration to the vocal folds with intermittent throat clear response. Pt benefitted from a Mendelsohn maneuver to increase bolus propulsion and duration of PES opening. Pt cued to prolong  swallow with increased force. Quantity of residue was reduced, but not eliminated. Second swallow also needed, as well as following solids with liquids. Strongly encouraged pt to f/u with OP SLP to target both swallowing and voice. Pts hypophonia improved with cued for breath support. Pt to continue regular/thin diet.    Factors that may increase risk of adverse event in presence of aspiration Noe & Lianne 2021): Factors that may increase risk of adverse event in presence of aspiration Noe & Lianne 2021): Weak cough   DIGEST Swallow Severity Rating*             Safety:              Efficiency:             Overall Pharyngeal Swallow Severity:  1: mild; 2: moderate; 3: severe; 4: profound  *The Dynamic Imaging Grade of Swallowing Toxicity is standardized for the head and neck cancer population, however, demonstrates promising clinical applications across populations to standardize the clinical rating of pharyngeal swallow safety and severity.   Objective recommended compensations: Recommendations: PO diet PO Diet Recommendation: Regular; Thin liquids (Level 0) Liquid Administration via: Cup; Straw Medication Administration: Whole meds with puree Supervision: Patient able to self-feed Swallowing strategies  : Swallow hold (Mendelsohn maneuver); effortful swallow; Follow solids with liquids; Multiple dry swallows after each bite/sip Postural changes: Position pt fully upright for meals Oral care  recommendations: Oral care BID (2x/day)   PATIENT REPORTED OUTCOME MEASURES (PROM): EAT-10: pt scored himself 18/40 with lower scores indicating less impact of swallowing sx on QoL. 4 (severe problem) was answered for swallowing solids takes extra effort, the pleasure of eating is affected by my swallowing, and 3 was answered for swallowing pills takes extra effort, and swallowing is stressful.                                                                                                                              TREATMENT DATE:   02/12/24: SPEECH: SLP reviewed pt's PhoRTE with him, requiring usual mod A for proper procedure. Pt has been completing them with wife most days once/day. SLP strongly encouraged pt/wife to complete at least 5/7 days/week BID.  SWALLOW: Then SLP reviewed pt's dysphagia HEP with him and he req'd occasional min-mod A for proper procedure. SLP demonstrated that a drop of water can make it easier to complete HEP and told pt and wife this today and had pt complete 6 reps using drop of water. Pt took sip x1 so he req'd cue for one drop.   02/05/24: SWALLOW: SLP reminded pt that he should not eat and watch TV and explained rationale (See S). He has been more consistent with adhering to precautions with all POs since last visit.  Today with precautions pt req'd SBA with double swallow with solids before a sip.  SPEECH: PhoRTE exercises introduced today. SLP brought Pat back to go through exercises with pt at home should he not recall how to complete.  Average /a/ 86dB, 15 seconds; average loudness with numbers - 84dB, with occasional min A for pitch variation in higher frequencies; with high pitched sentences initial mod cue for higher pitch, and one cue for loudness after 4 reps, average 84dB. Lower pitched sentences, occasional mod cues for loudness faded to min cues, average 84dB. Cues necessary for lower pitch voice after 6 reps. If little progress with voice volumle after 4-6 weeks, SLP may recommend ENT referral to referring MD. Pt req'd consistent mod cues with models intermittently during this task. Pat told SLP she was comfortable assisting pt at home.   01/31/24: SLP to add PhoRTE to pt's regimen next session. SLP learned pt has not been performing HEP to frequency or # reps prescribed. SLP strongly encouraged pt/wife to complete HEP x2/day 15 reps each exercise, at least 4 days/week with occasional 5th day/week. SLP will aim to schedule retest of MBS week of  03/04/24. With HEP pt req'd usual min A initially for mendelsohn, effortful, and chin tuck with towel, and usual min a for entirety of Masako for tongue protrusion. With POs, pt req'd cue once for double hard swallow with bite before taking a sip (he wanted to take a sip after a bite of cracker). Pt stated he was not using swallow precautions for meals and SLP wrote a reminder on the sheet to ensure this occurs.  01/24/24: Pt's speech measured today at average 71dB. SLP to continue to measure speech volume - loudness could be more dependent upon pt's comfort with the topic rather than a neurological etiology.  Pt req'd rare  min A with effortful, occasional min-mod A for tongue protrusion for Masako, and occasional min A for swallow hold with Mendelsohn. Fatigue noted after 6 reps of effortful, 6 reps of Masako, and 5 reps of Mendelsohn. With POs, pt req'd extra time consistently, and one cue for sip after swallows of a bite of cracker.   01/15/24: With HEP, pt demonstrated fatigue after 5-6 swallows of effortful and of Masako. Ultimately pt req'd rare min A with effortful, usual mod A for tongue protrusion with Masako, usual min A for holding swallow with Mendelsohn, Fatigue noted after 7 reps of Mendelsohn. Pt req'd initial min A however only req'd mod I and consistent extra time to achieve success with following precautions with two peanut butter crackers and water.  Wife states pt doing better both with eating and with HEP at home.  Pt's voice measured today at average 68dB, below WNL. SLP to consider adding loudness retraining tasks to pt's POC.   01/10/24: Pt req'd cues for taking exercises out and not precautions. Req'd SBA with effortful, usual min cues for tongue protrusion with Masako, and iniital mod A faded to independent with procedure with Mendelsohn. SLP noticed pt unable to track reps so SLP asked pt how he was tracking reps and he answered Pat. SLP encouraged him to attempt to make it a  joint effort but suspect it will be better for wife to track and him focus on procedure.  With precautions, pt req'd initial assistance with sip after bite, but was independent with this with mod I (written cues) and extra time by session end. SLP verified that pt was not eating with distractions due to losing place with sequencing of precautions.   01/02/24: Pt and wife with questions re: CTAR with towel. SLP told them to hold towel and for pt to put chin to floor and press tightly on towel. Pt was not holding towel, but relying on chin to hold towel. Pt and wife vocalized thanks for reiterating holding the towel. With precautions at home, pt is rarely using. SLP suggested no TV while eating - pt is watching with dinner and is using his homemade visual cues but SLP wondered if simplification of this would be helpful. See pt instructions - using this sheet from SLP pt req'd extra time but was mod I with following precautions in quiet environment; SLP remarked this was the least SLP has had to cue pt since practicing precautions in session. Wife agreed. SLP suggested wife and pt take two crackers three times a day in addition to mealtimes for pt to practice precautions. SLP and wife had to cue pt to cont practicing with cracker bites, and cued once when pt asked did I double swallow? SLP shared s/sx aspiration PNA and with mod I pt able to reiterate these to SLP.  12/19/23: Pt described good days as ones where he remembers to do the exercises and they feel good.SABRA Describes a bad day when he does not do the exercises or the exercises are more difficult to accomplish (They don't go as smooth). Today pt req'd usual mod A for proper HEP completion. SLP brought pt's wife back to use as a second set of eyes to watch pt complete HEP at home and aid in memory of HEP details.  She will do this - SLP suggested her watching and cueing pt until she no longer has to say anything when he performs HEP. Pt req'd written  notes in order to complete swallow precautions today. Mod extra time req'd for the first 3 bites/sips and then min extra time req'd.   12/07/23: SLP provided pt with PROM today - see above for details. With crackers and water pt req'd usual mod cues for alternating solid-liquid and for multiple swallows. He was independent after initial min-mod cues for swallow hold 1/2-1 second. With HEP, pt req'd usual mod A for all exercises faded to occasional min-mod A, except chin tuck with towel where cues faded to occasional min A. SLP strongly reiterated to pt to have Pat with him to keep him performing HEP correctly.   12/05/23: NEEDS PROM next session. Pt reports he is using written cues for following precautions at mealtimes. He is completing effortful swallow at home. SLP educated pt on procedure for Mendelsohn, Masako, and chin tuck against resistance (with towel). Pt req'd mod A and faded to mod I. Pt to complete x2/day with 15 reps each.   11/23/23: SLP reviewed results and recommendations of MBS with pt and wife. SLP taught pt effortful swallow, and attempted to teach pt Masako but was unable to do so due to time constraints. He was told to complete 15 reps effortful swallow BID. SLP suggested pt try Masako with 1-2 drops of water and if able to swallow with tongue out past lips he should add 15 reps BID to his HEP until next session. SLP will need to add Claudette and CTAR next session.   PATIENT EDUCATION: Education details: see treatment date for details Person educated: Patient Education method: Explanation, Demonstration, Verbal cues, and Handouts Education comprehension: verbalized understanding, returned demonstration, verbal cues required, and needs further education   ASSESSMENT:  CLINICAL IMPRESSION: Patient is a 73 y.o. M who was seen today for treatment of swallowing and following MBS 11/01/23, and weaker voice. See treatment date above for today's date for further details on today's  session. Pt reported current sx are memory difficulty, reduced voice volume and strength, disordered gait pattern, left lingual, and labial weakness and bil decr'd velar, lingual, and labial ROM without notable dysarthria, and hypomimia which improved with cues (x2 separate instances) to exaggerate facial movements. Pt would like to participate in skilled ST for swallowing.   OBJECTIVE IMPAIRMENTS: include memory, voice disorder, and dysphagia. These impairments are limiting patient from household responsibilities, ADLs/IADLs, effectively communicating at home and in community, and safety when swallowing. Factors affecting potential to achieve goals and functional outcome are ability to learn/carryover information. Patient will benefit from skilled SLP services to address above impairments and improve overall function.  REHAB POTENTIAL: Good   GOALS: Goals reviewed with patient? Yes  SHORT TERM GOALS: Target date: 01/05/24 (due to first ST session 12/24/23)  Pt will complete HEP with rare min A Baseline:  Goal status: partially met  2.  Pt will follow swallowing precautions with rare min A Baseline:  Goal status: met  3.  Pt will tell SLP 3 overt s/sx aspiration PNA with mod I Baseline:  Goal status: Met   LONG TERM GOALS: Target date:  03/15/24  Pt will improve PROM Baseline:  Goal status: INITIAL  2.  Pt will complete HEP with mod I in 3 sessions Baseline:  Goal status: INITIAL  3.  Pt will follow swallowing precautions with mod I in 3 sessions Baseline: 01/15/24  Goal status: INITIAL  4.  Pt will complete PhoRTE exercises with rare min A Baseline:  Goal status: INITIAL  PLAN:  SLP FREQUENCY: 2x/week  SLP DURATION: 8 weeks  PLANNED INTERVENTIONS: Aspiration precaution training, Pharyngeal strengthening exercises, Diet toleration management , Environmental controls, Trials of upgraded texture/liquids, Internal/external aids, SLP instruction and feedback, Compensatory  strategies, Patient/family education, [phone] Treatment of speech (30 or 45 min) , [id number] Treatment of swallowing function, and [id number]- Speech Eval Behavioral Qualitative Voice Resonance    [facility], CCC-SLP 02/12/2024, 12:18 PM

## 2024-02-22 ENCOUNTER — Telehealth: Payer: Self-pay

## 2024-02-22 ENCOUNTER — Ambulatory Visit (INDEPENDENT_AMBULATORY_CARE_PROVIDER_SITE_OTHER): Admitting: Podiatry

## 2024-02-22 ENCOUNTER — Ambulatory Visit

## 2024-02-22 VITALS — Ht 72.0 in | Wt 170.0 lb

## 2024-02-22 DIAGNOSIS — M79674 Pain in right toe(s): Secondary | ICD-10-CM

## 2024-02-22 DIAGNOSIS — R1313 Dysphagia, pharyngeal phase: Secondary | ICD-10-CM

## 2024-02-22 DIAGNOSIS — Z7901 Long term (current) use of anticoagulants: Secondary | ICD-10-CM

## 2024-02-22 DIAGNOSIS — B351 Tinea unguium: Secondary | ICD-10-CM

## 2024-02-22 DIAGNOSIS — R499 Unspecified voice and resonance disorder: Secondary | ICD-10-CM

## 2024-02-22 DIAGNOSIS — M79675 Pain in left toe(s): Secondary | ICD-10-CM

## 2024-02-22 NOTE — Progress Notes (Signed)
 Subjective: Chief Complaint  Patient presents with   Nail Problem    Pt is here for RFC.     73 year old male presents after the above concerns.  States his nails are thickened elongated he has difficulty trimming them.  No open lesions.  No other concerns.  He is on Eliquis .   Objective: AAO x3, NAD DP/PT pulses palpable bilaterally, CRT less than 3 seconds Nails are hypertrophic, dystrophic, brittle, discolored, elongated 10. No surrounding redness or drainage. Tenderness nails 1-5 bilaterally. No open lesions or pre-ulcerative lesions are identified today. No pain with calf compression, swelling, warmth, erythema  Assessment: Symptomatic onychomycosis  Plan: -All treatment options discussed with the patient including all alternatives, risks, complications.  -Sharply debrided nails x 10 without any complications or bleeding. Continue ciclopirox .  -Patient encouraged to call the office with any questions, concerns, change in symptoms.   Return in about 3 months (around 05/24/2024).   Donnice JONELLE Fees DPM

## 2024-02-22 NOTE — Therapy (Signed)
 OUTPATIENT SPEECH LANGUAGE PATHOLOGY TREATMENT   Patient Name: Bryan Hart MRN: [id number] DOB:1951-05-15, 73 y.o., male Today's Date: 02/22/2024  PCP: [name], DO REFERRING PROVIDER: same as PCP  END OF SESSION:  End of Session - 02/22/24 1612     Visit Number 12    Number of Visits 17    Date for SLP Re-Evaluation 03/15/24    SLP Start Time 1615    Activity Tolerance Patient tolerated treatment well               Past Medical History:  Diagnosis Date   Arthritis    HLD (hyperlipidemia)    Hypertension    dr  ed green      stress test   12 yrs ago   PVC's (premature ventricular contractions) 12 years ago   stress test done   Sleep apnea    Stroke [facility])    minor stroke 01/2018   Past Surgical History:  Procedure Laterality Date   ANKLE ARTHROSCOPY  06/27/2008   JOINT REPLACEMENT  06/27/2009   rt shoulder rotator cuff repair   knee surgeries  8030,8006   x2   SHOULDER HEMI-ARTHROPLASTY  01/12/2012   Procedure: SHOULDER HEMI-ARTHROPLASTY;  Surgeon: Eva Elsie Herring, MD;  Location: [facility] OR;  Service: Orthopedics;  Laterality: Right;   SHOULDER SURGERY Right 07/2021   TOTAL KNEE ARTHROPLASTY Right 05/31/2022   Procedure: RIGHT TOTAL KNEE ARTHROPLASTY;  Surgeon: [name] Lonni GRADE, MD;  Location: [facility] OR;  Service: Orthopedics;  Laterality: Right;   Patient Active Problem List   Diagnosis Date Noted   Paroxysmal atrial fibrillation (HCC) 04/12/2023   Hypercoagulable state due to paroxysmal atrial fibrillation (HCC) 04/12/2023   Asymptomatic PVCs 07/26/2022   Hyperlipidemia 07/26/2022   Family history of heart disease 07/26/2022   Essential hypertension 07/26/2022   Status post total right knee replacement 05/31/2022   Unilateral primary osteoarthritis, left knee 03/28/2022   Unilateral primary osteoarthritis, right knee 03/28/2022   Ceruminosis, bilateral 08/18/2021   Primary osteoarthritis of left knee 04/30/2020   Effusion, right knee  03/31/2020   Degenerative arthritis of right knee 03/31/2020   Bilateral primary osteoarthritis of knee 12/27/2018   AV block 07/29/2018   Central sleep apnea 07/29/2018   Cerebrovascular accident [facility]) 07/29/2018   Primary localized osteoarthrosis of shoulder region 01/13/2012      ONSET DATE: at least 5 years ago (Script dated 11/09/23)  REFERRING DIAG: Dysphagia, oropharyngeal phase  THERAPY DIAG:  No diagnosis found.  Rationale for Evaluation and Treatment: Rehabilitation  SUBJECTIVE:   SUBJECTIVE STATEMENT:   Pt accompanied by: self  PERTINENT HISTORY: CVA 2019, memory disorder. See above.  PAIN:  Are you having pain? No  FALLS: Has patient fallen in last 6 months?  No   PATIENT GOALS: Improve swallowing  OBJECTIVE:  Note: Objective measures were completed at Evaluation unless otherwise noted. OBJECTIVE:   DIAGNOSTIC FINDINGS:  NEUROIMAGING REPORT STUDY DATE: 07/26/2023 PATIENT NAME: Bryan Hart DOB: August 20, 1950 MRN: [id number]   ORDERING CLINICIAN: Dr. [name] CLINICAL HISTORY: 73  yr old patient evaluated for memory loss COMPARISON FILMS: None EXAM: MRI brain with and without contrast TECHNIQUE: Sagittal T1, axial T1, T2, FLAIR, DWI, SWI, coronal T2 and postcontrast axial and coronal T1 images obtained through the brain CONTRAST: 8 mL IV gadopiclenol  IMAGING SITE: [facility]   FINDINGS:  Brain parenchyma shows mild age-related changes of chronic small vessel disease and generalized cerebral atrophy.  No structural lesion, tumor or infarct is noted.  Diffusion-weighted  imaging negative for acute ischemia.  SWI images do not show significant microhemorrhages.  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  Cortical sulci and gyri show normal appearance.  Extra-axial (but normal.  Calvarium shows no abnormalities.  Orbits appear  unremarkable paranasal sinuses show mild chronic mucosal thickening.  The pituitary gland and cerebellar tonsils appear normal. The visualized portion of the upper cervical spine shows only minor degenerative changes.  Flow-voids of large vessels are intact and circulation appear to be patent.  Postcontrast images do not result in abnormal areas of enhancement.   IMPRESSION: MRI scan of the brain with and without contrast showing mild age-related changes of chronic small vessel disease and generalized cerebral atrophy.  HISTORY OF PRESENT ILLNESS - neurology December '24 UPDATE (06/12/23, VRP): Since last visit, more progression of symptoms. Decline in sense of direction. Sometimes does not recognize own wife. Memory decline has continued.  INSTRUMENTAL SWALLOW STUDY FINDINGS (MBSS) 11/01/23 Objective swallow impairments:  Pt demonstrates a moderate pharyngeal dysphagia with a DIGEST severity score of 1, characterized primarily by poor efficiency. Pt has relatively good oral control and timely swallow intiation. He has about 25% of residue remaining in vallculae post swallow (spills to pyriform) with solids and liquids. There is decreased base of tongue tension, decreased duration and ROM with laryngeal elevation and hyoid excursion. There is also an appearance of bony protrusions along the cervical spine partially impeding bolus flow through PES. Esophageal sweep showed barium table hesitate in multiple locations with retrograde flow of puree bolus given to aid in transit. Throughout study pt had only rare trace penetration to the vocal folds with intermittent throat clear response. Pt benefitted from a Mendelsohn maneuver to increase bolus propulsion and duration of PES opening. Pt cued to prolong swallow with increased force. Quantity of residue was reduced, but not eliminated. Second swallow also needed, as well as following solids with liquids. Strongly encouraged pt to f/u with OP SLP to target both  swallowing and voice. Pts hypophonia improved with cued for breath support. Pt to continue regular/thin diet.    Factors that may increase risk of adverse event in presence of aspiration Noe & Lianne 2021): Factors that may increase risk of adverse event in presence of aspiration Noe & Lianne 2021): Weak cough   DIGEST Swallow Severity Rating*             Safety:              Efficiency:             Overall Pharyngeal Swallow Severity:  1: mild; 2: moderate; 3: severe; 4: profound  *The Dynamic Imaging Grade of Swallowing Toxicity is standardized for the head and neck cancer population, however, demonstrates promising clinical applications across populations to standardize the clinical rating of pharyngeal swallow safety and severity.   Objective recommended compensations: Recommendations: PO diet PO Diet Recommendation: Regular; Thin liquids (Level 0) Liquid Administration via: Cup; Straw Medication Administration: Whole meds with puree Supervision: Patient able to self-feed Swallowing strategies  : Swallow hold (Mendelsohn maneuver); effortful swallow; Follow solids with liquids; Multiple dry swallows after each bite/sip Postural changes: Position pt fully upright for meals Oral care recommendations: Oral care BID (2x/day)   PATIENT REPORTED OUTCOME MEASURES (PROM): EAT-10: pt scored himself 18/40 with lower scores indicating less impact of swallowing sx on QoL. 4 (severe problem) was answered for swallowing solids takes extra effort,  the pleasure of eating is affected by my swallowing, and 3 was answered for swallowing pills takes extra effort, and swallowing is stressful.                                                                                                                             TREATMENT DATE:   02/22/24: SPEECH: SLP assessed pt's wife completing exercises with pt. Pt req'd occasional faded to rare min cues for louder productions of /a/ average 84 dB  (average began at 79dB). Needed usual mod cues faded to rare min=mod cues for keeping productions shorter in length, and usual mod cues for loudness. Sentences: usual mod cues for natural sounding sentences, faded to rare min-mod cues. SLP reiterated to wife to have pt's volume be higher in all tasks, and to cue pt mid-production as well as give models - she was cueing with words only.  SWALLOWING: With swallow HEP pt req'd occasional cues faded to rare min A with all for procedure. SLP suggested pt use finger touch for Mendelsohn to monitor thyroid  movement. With POs, pt req'd mod A consistently. I'm not doing this at home, pt stated. SLP warned pt that he should do so if he wants to be the safest possible while eating, per MBS.   02/12/24: SPEECH: SLP reviewed pt's PhoRTE with him, requiring usual mod A for proper procedure. Pt has been completing them with wife most days once/day. SLP strongly encouraged pt/wife to complete at least 5/7 days/week BID.  SWALLOW: Then SLP reviewed pt's dysphagia HEP with him and he req'd occasional min-mod A for proper procedure. SLP demonstrated that a drop of water can make it easier to complete HEP and told pt and wife this today and had pt complete 6 reps using drop of water. Pt took sip x1 so he req'd cue for one drop.   02/05/24: SWALLOW: SLP reminded pt that he should not eat and watch TV and explained rationale (See S). He has been more consistent with adhering to precautions with all POs since last visit.  Today with precautions pt req'd SBA with double swallow with solids before a sip.  SPEECH: PhoRTE exercises introduced today. SLP brought Pat back to go through exercises with pt at home should he not recall how to complete.  Average /a/ 86dB, 15 seconds; average loudness with numbers - 84dB, with occasional min A for pitch variation in higher frequencies; with high pitched sentences initial mod cue for higher pitch, and one cue for loudness after 4 reps,  average 84dB. Lower pitched sentences, occasional mod cues for loudness faded to min cues, average 84dB. Cues necessary for lower pitch voice after 6 reps. If little progress with voice volumle after 4-6 weeks, SLP may recommend ENT referral to referring MD. Pt req'd consistent mod cues with models intermittently during this task. Pat told SLP she was comfortable assisting pt at home.   01/31/24: SLP to add PhoRTE to pt's regimen next  session. SLP learned pt has not been performing HEP to frequency or # reps prescribed. SLP strongly encouraged pt/wife to complete HEP x2/day 15 reps each exercise, at least 4 days/week with occasional 5th day/week. SLP will aim to schedule retest of MBS week of 03/04/24. With HEP pt req'd usual min A initially for mendelsohn, effortful, and chin tuck with towel, and usual min a for entirety of Masako for tongue protrusion. With POs, pt req'd cue once for double hard swallow with bite before taking a sip (he wanted to take a sip after a bite of cracker). Pt stated he was not using swallow precautions for meals and SLP wrote a reminder on the sheet to ensure this occurs.   01/24/24: Pt's speech measured today at average 71dB. SLP to continue to measure speech volume - loudness could be more dependent upon pt's comfort with the topic rather than a neurological etiology.  Pt req'd rare  min A with effortful, occasional min-mod A for tongue protrusion for Masako, and occasional min A for swallow hold with Mendelsohn. Fatigue noted after 6 reps of effortful, 6 reps of Masako, and 5 reps of Mendelsohn. With POs, pt req'd extra time consistently, and one cue for sip after swallows of a bite of cracker.   01/15/24: With HEP, pt demonstrated fatigue after 5-6 swallows of effortful and of Masako. Ultimately pt req'd rare min A with effortful, usual mod A for tongue protrusion with Masako, usual min A for holding swallow with Mendelsohn, Fatigue noted after 7 reps of Mendelsohn. Pt req'd  initial min A however only req'd mod I and consistent extra time to achieve success with following precautions with two peanut butter crackers and water.  Wife states pt doing better both with eating and with HEP at home.  Pt's voice measured today at average 68dB, below WNL. SLP to consider adding loudness retraining tasks to pt's POC.   01/10/24: Pt req'd cues for taking exercises out and not precautions. Req'd SBA with effortful, usual min cues for tongue protrusion with Masako, and iniital mod A faded to independent with procedure with Mendelsohn. SLP noticed pt unable to track reps so SLP asked pt how he was tracking reps and he answered Pat. SLP encouraged him to attempt to make it a joint effort but suspect it will be better for wife to track and him focus on procedure.  With precautions, pt req'd initial assistance with sip after bite, but was independent with this with mod I (written cues) and extra time by session end. SLP verified that pt was not eating with distractions due to losing place with sequencing of precautions.   01/02/24: Pt and wife with questions re: CTAR with towel. SLP told them to hold towel and for pt to put chin to floor and press tightly on towel. Pt was not holding towel, but relying on chin to hold towel. Pt and wife vocalized thanks for reiterating holding the towel. With precautions at home, pt is rarely using. SLP suggested no TV while eating - pt is watching with dinner and is using his homemade visual cues but SLP wondered if simplification of this would be helpful. See pt instructions - using this sheet from SLP pt req'd extra time but was mod I with following precautions in quiet environment; SLP remarked this was the least SLP has had to cue pt since practicing precautions in session. Wife agreed. SLP suggested wife and pt take two crackers three times a day in addition to mealtimes  for pt to practice precautions. SLP and wife had to cue pt to cont practicing with  cracker bites, and cued once when pt asked did I double swallow? SLP shared s/sx aspiration PNA and with mod I pt able to reiterate these to SLP.  12/19/23: Pt described good days as ones where he remembers to do the exercises and they feel good.SABRA Describes a bad day when he does not do the exercises or the exercises are more difficult to accomplish (They don't go as smooth). Today pt req'd usual mod A for proper HEP completion. SLP brought pt's wife back to use as a second set of eyes to watch pt complete HEP at home and aid in memory of HEP details. She will do this - SLP suggested her watching and cueing pt until she no longer has to say anything when he performs HEP. Pt req'd written notes in order to complete swallow precautions today. Mod extra time req'd for the first 3 bites/sips and then min extra time req'd.   12/07/23: SLP provided pt with PROM today - see above for details. With crackers and water pt req'd usual mod cues for alternating solid-liquid and for multiple swallows. He was independent after initial min-mod cues for swallow hold 1/2-1 second. With HEP, pt req'd usual mod A for all exercises faded to occasional min-mod A, except chin tuck with towel where cues faded to occasional min A. SLP strongly reiterated to pt to have Pat with him to keep him performing HEP correctly.   12/05/23: NEEDS PROM next session. Pt reports he is using written cues for following precautions at mealtimes. He is completing effortful swallow at home. SLP educated pt on procedure for Mendelsohn, Masako, and chin tuck against resistance (with towel). Pt req'd mod A and faded to mod I. Pt to complete x2/day with 15 reps each.   11/23/23: SLP reviewed results and recommendations of MBS with pt and wife. SLP taught pt effortful swallow, and attempted to teach pt Masako but was unable to do so due to time constraints. He was told to complete 15 reps effortful swallow BID. SLP suggested pt try Masako with 1-2  drops of water and if able to swallow with tongue out past lips he should add 15 reps BID to his HEP until next session. SLP will need to add Claudette and CTAR next session.   PATIENT EDUCATION: Education details: see treatment date for details Person educated: Patient Education method: Explanation, Demonstration, Verbal cues, and Handouts Education comprehension: verbalized understanding, returned demonstration, verbal cues required, and needs further education   ASSESSMENT:  CLINICAL IMPRESSION: Patient is a 73 y.o. M who was seen today for treatment of swallowing following MBS 11/01/23, and weaker voice. See treatment date above for today's date for further details on today's session. Pt is not performing swallow strategies at home. Koleson and [name] reported current sx are memory difficulty, reduced voice volume and strength, disordered gait pattern, left lingual, and labial weakness and bil decr'd velar, lingual, and labial ROM without notable dysarthria, and hypomimia which improved with cues (x2 separate instances) to exaggerate facial movements.   OBJECTIVE IMPAIRMENTS: include memory, voice disorder, and dysphagia. These impairments are limiting patient from household responsibilities, ADLs/IADLs, effectively communicating at home and in community, and safety when swallowing. Factors affecting potential to achieve goals and functional outcome are ability to learn/carryover information. Patient will benefit from skilled SLP services to address above impairments and improve overall function.  REHAB POTENTIAL: Good   GOALS:  Goals reviewed with patient? Yes  SHORT TERM GOALS: Target date: 01/05/24 (due to first ST session 12/24/23)  Pt will complete HEP with rare min A Baseline:  Goal status: partially met  2.  Pt will follow swallowing precautions with rare min A Baseline:  Goal status: met  3.  Pt will tell SLP 3 overt s/sx aspiration PNA with mod I Baseline:  Goal status:  Met   LONG TERM GOALS: Target date:  03/15/24  Pt will improve PROM Baseline:  Goal status: INITIAL  2.  Pt will complete HEP with mod I in 3 sessions Baseline:  Goal status: INITIAL  3.  Pt will follow swallowing precautions with mod I in 3 sessions Baseline: 01/15/24 Goal status: INITIAL  4.  Pt will complete PhoRTE exercises with rare min A Baseline:  Goal status: INITIAL  PLAN:  SLP FREQUENCY: 2x/week  SLP DURATION: 8 weeks  PLANNED INTERVENTIONS: Aspiration precaution training, Pharyngeal strengthening exercises, Diet toleration management , Environmental controls, Trials of upgraded texture/liquids, Internal/external aids, SLP instruction and feedback, Compensatory strategies, Patient/family education, [phone] Treatment of speech (30 or 45 min) , [id number] Treatment of swallowing function, and [id number]- Speech Eval Behavioral Qualitative Voice Resonance    [facility], CCC-SLP 02/22/2024, 4:12 PM

## 2024-02-22 NOTE — Telephone Encounter (Signed)
 I called patient to discuss scheduling left TKA.  Left voice mail message for return call.

## 2024-02-28 ENCOUNTER — Telehealth: Payer: Self-pay

## 2024-02-28 NOTE — Telephone Encounter (Signed)
 Received voice mail from patient stating he wants to postpone scheduling TKA due to other things he has going on.  He will call back when ready to schedule.

## 2024-03-01 ENCOUNTER — Ambulatory Visit: Attending: Internal Medicine

## 2024-03-01 DIAGNOSIS — R499 Unspecified voice and resonance disorder: Secondary | ICD-10-CM | POA: Diagnosis not present

## 2024-03-01 DIAGNOSIS — R1313 Dysphagia, pharyngeal phase: Secondary | ICD-10-CM | POA: Insufficient documentation

## 2024-03-01 DIAGNOSIS — R1310 Dysphagia, unspecified: Secondary | ICD-10-CM | POA: Insufficient documentation

## 2024-03-01 NOTE — Therapy (Signed)
 OUTPATIENT SPEECH LANGUAGE PATHOLOGY TREATMENT   Patient Name: Bryan Hart MRN: [id number] DOB:11/24/1950, 73 y.o., male Today's Date: 03/01/2024  PCP: [name], DO REFERRING PROVIDER: same as PCP  END OF SESSION:  End of Session - 03/01/24 0808     Visit Number 13    Number of Visits 17    Date for SLP Re-Evaluation 03/15/24    SLP Start Time 0804    SLP Stop Time  0841    SLP Time Calculation (min) 37 min    Activity Tolerance Patient tolerated treatment well               Past Medical History:  Diagnosis Date   Arthritis    HLD (hyperlipidemia)    Hypertension    dr  ed green      stress test   12 yrs ago   PVC's (premature ventricular contractions) 12 years ago   stress test done   Sleep apnea    Stroke [facility])    minor stroke 01/2018   Past Surgical History:  Procedure Laterality Date   ANKLE ARTHROSCOPY  06/27/2008   JOINT REPLACEMENT  06/27/2009   rt shoulder rotator cuff repair   knee surgeries  8030,8006   x2   SHOULDER HEMI-ARTHROPLASTY  01/12/2012   Procedure: SHOULDER HEMI-ARTHROPLASTY;  Surgeon: Eva Elsie Herring, MD;  Location: [facility] OR;  Service: Orthopedics;  Laterality: Right;   SHOULDER SURGERY Right 07/2021   TOTAL KNEE ARTHROPLASTY Right 05/31/2022   Procedure: RIGHT TOTAL KNEE ARTHROPLASTY;  Surgeon: [name] Lonni GRADE, MD;  Location: [facility] OR;  Service: Orthopedics;  Laterality: Right;   Patient Active Problem List   Diagnosis Date Noted   Paroxysmal atrial fibrillation (HCC) 04/12/2023   Hypercoagulable state due to paroxysmal atrial fibrillation (HCC) 04/12/2023   Asymptomatic PVCs 07/26/2022   Hyperlipidemia 07/26/2022   Family history of heart disease 07/26/2022   Essential hypertension 07/26/2022   Status post total right knee replacement 05/31/2022   Unilateral primary osteoarthritis, left knee 03/28/2022   Unilateral primary osteoarthritis, right knee 03/28/2022   Ceruminosis, bilateral 08/18/2021   Primary  osteoarthritis of left knee 04/30/2020   Effusion, right knee 03/31/2020   Degenerative arthritis of right knee 03/31/2020   Bilateral primary osteoarthritis of knee 12/27/2018   AV block 07/29/2018   Central sleep apnea 07/29/2018   Cerebrovascular accident [facility]) 07/29/2018   Primary localized osteoarthrosis of shoulder region 01/13/2012      ONSET DATE: at least 5 years ago (Script dated 11/09/23)  REFERRING DIAG: Dysphagia, oropharyngeal phase  THERAPY DIAG:  Dysphagia, pharyngeal phase  Voice disorder  Rationale for Evaluation and Treatment: Rehabilitation  SUBJECTIVE:   SUBJECTIVE STATEMENT:   Pt accompanied by: self  PERTINENT HISTORY: CVA 2019, memory disorder. See above.  PAIN:  Are you having pain? No  FALLS: Has patient fallen in last 6 months?  No   PATIENT GOALS: Improve swallowing  OBJECTIVE:  Note: Objective measures were completed at Evaluation unless otherwise noted. OBJECTIVE:   DIAGNOSTIC FINDINGS:  NEUROIMAGING REPORT STUDY DATE: 07/26/2023 PATIENT NAME: Bryan Hart DOB: [birth year] MRN: [id number]   ORDERING CLINICIAN: Dr. [name] CLINICAL HISTORY: 73  yr old patient evaluated for memory loss COMPARISON FILMS: None EXAM: MRI brain with and without contrast TECHNIQUE: Sagittal T1, axial T1, T2, FLAIR, DWI, SWI, coronal T2 and postcontrast axial and coronal T1 images obtained through the brain CONTRAST: 8 mL IV gadopiclenol  IMAGING SITE: [facility]   FINDINGS:  Brain parenchyma shows mild age-related changes  of chronic small vessel disease and generalized cerebral atrophy.  No structural lesion, tumor or infarct is noted.  Diffusion-weighted imaging negative for acute ischemia.  SWI images do not show significant microhemorrhages.  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  Cortical sulci and gyri show normal  appearance.  Extra-axial (but normal.  Calvarium shows no abnormalities.  Orbits appear unremarkable paranasal sinuses show mild chronic mucosal thickening.  The pituitary gland and cerebellar tonsils appear normal. The visualized portion of the upper cervical spine shows only minor degenerative changes.  Flow-voids of large vessels are intact and circulation appear to be patent.  Postcontrast images do not result in abnormal areas of enhancement.   IMPRESSION: MRI scan of the brain with and without contrast showing mild age-related changes of chronic small vessel disease and generalized cerebral atrophy.  HISTORY OF PRESENT ILLNESS - neurology December '24 UPDATE (06/12/23, VRP): Since last visit, more progression of symptoms. Decline in sense of direction. Sometimes does not recognize own wife. Memory decline has continued.  INSTRUMENTAL SWALLOW STUDY FINDINGS (MBSS) 11/01/23 Objective swallow impairments:  Pt demonstrates a moderate pharyngeal dysphagia with a DIGEST severity score of 1, characterized primarily by poor efficiency. Pt has relatively good oral control and timely swallow intiation. He has about 25% of residue remaining in vallculae post swallow (spills to pyriform) with solids and liquids. There is decreased base of tongue tension, decreased duration and ROM with laryngeal elevation and hyoid excursion. There is also an appearance of bony protrusions along the cervical spine partially impeding bolus flow through PES. Esophageal sweep showed barium table hesitate in multiple locations with retrograde flow of puree bolus given to aid in transit. Throughout study pt had only rare trace penetration to the vocal folds with intermittent throat clear response. Pt benefitted from a Mendelsohn maneuver to increase bolus propulsion and duration of PES opening. Pt cued to prolong swallow with increased force. Quantity of residue was reduced, but not eliminated. Second swallow also needed, as well as  following solids with liquids. Strongly encouraged pt to f/u with OP SLP to target both swallowing and voice. Pts hypophonia improved with cued for breath support. Pt to continue regular/thin diet.    Factors that may increase risk of adverse event in presence of aspiration Noe & Lianne 2021): Factors that may increase risk of adverse event in presence of aspiration Noe & Lianne 2021): Weak cough   DIGEST Swallow Severity Rating*             Safety:              Efficiency:             Overall Pharyngeal Swallow Severity:  1: mild; 2: moderate; 3: severe; 4: profound  *The Dynamic Imaging Grade of Swallowing Toxicity is standardized for the head and neck cancer population, however, demonstrates promising clinical applications across populations to standardize the clinical rating of pharyngeal swallow safety and severity.   Objective recommended compensations: Recommendations: PO diet PO Diet Recommendation: Regular; Thin liquids (Level 0) Liquid Administration via: Cup; Straw Medication Administration: Whole meds with puree Supervision: Patient able to self-feed Swallowing strategies  : Swallow hold (Mendelsohn maneuver); effortful swallow; Follow solids with liquids; Multiple dry swallows after each bite/sip Postural changes: Position pt fully upright for meals Oral care recommendations: Oral care BID (2x/day)   PATIENT REPORTED OUTCOME MEASURES (PROM): EAT-10: pt scored himself 18/40 with lower  scores indicating less impact of swallowing sx on QoL. 4 (severe problem) was answered for swallowing solids takes extra effort, the pleasure of eating is affected by my swallowing, and 3 was answered for swallowing pills takes extra effort, and swallowing is stressful.                                                                                                                             TREATMENT DATE:   03/01/24: Pt reports that HEPs are not always done BID.  SPEECH: PhoRTE  exercises today. Dyan performed /a/ with initial min cues faded to independent for volume and occasional min mod cues for open mouth with average 86dB, numbers completed with initial cues to use numbers and not ohhhh, and consistent mod cues for loudness with average 82dB, high-pitched sentences with initial cues for procedure with average 83dB. And low-pitched sentences with occasional min cues necessary for low pitch with average 83dB. SWALLOW: Initial min cues necessary for each exercise. Majed req'd usual min A for tongue protrusion with Masako and for hold time with [employer]. Pt req'd usual mod A with swallow strategies.   02/22/24: SPEECH: SLP assessed pt's wife completing exercises with pt. Pt req'd occasional faded to rare min cues for louder productions of /a/ average 84 dB (average began at 79dB). Needed usual mod cues faded to rare min=mod cues for keeping productions shorter in length, and usual mod cues for loudness. Sentences: usual mod cues for natural sounding sentences, faded to rare min-mod cues. SLP reiterated to wife to have pt's volume be higher in all tasks, and to cue pt mid-production as well as give models - she was cueing with words only.  SWALLOWING: With swallow HEP pt req'd occasional cues faded to rare min A with all for procedure. SLP suggested pt use finger touch for Mendelsohn to monitor thyroid  movement. With POs, pt req'd mod A consistently. I'm not doing this at home, pt stated. SLP warned pt that he should do so if he wants to be the safest possible while eating, per MBS.   02/12/24: SPEECH: SLP reviewed pt's PhoRTE with him, requiring usual mod A for proper procedure. Pt has been completing them with wife most days once/day. SLP strongly encouraged pt/wife to complete at least 5/7 days/week BID.  SWALLOW: Then SLP reviewed pt's dysphagia HEP with him and he req'd occasional min-mod A for proper procedure. SLP demonstrated that a drop of water can make it easier to  complete HEP and told pt and wife this today and had pt complete 6 reps using drop of water. Pt took sip x1 so he req'd cue for one drop.   02/05/24: SWALLOW: SLP reminded pt that he should not eat and watch TV and explained rationale (See S). He has been more consistent with adhering to precautions with all POs since last visit.  Today with precautions pt req'd SBA with double swallow with solids before a sip.  SPEECH: PhoRTE  exercises introduced today. SLP brought Pat back to go through exercises with pt at home should he not recall how to complete.  Average /a/ 86dB, 15 seconds; average loudness with numbers - 84dB, with occasional min A for pitch variation in higher frequencies; with high pitched sentences initial mod cue for higher pitch, and one cue for loudness after 4 reps, average 84dB. Lower pitched sentences, occasional mod cues for loudness faded to min cues, average 84dB. Cues necessary for lower pitch voice after 6 reps. If little progress with voice volumle after 4-6 weeks, SLP may recommend ENT referral to referring MD. Pt req'd consistent mod cues with models intermittently during this task. Pat told SLP she was comfortable assisting pt at home.   01/31/24: SLP to add PhoRTE to pt's regimen next session. SLP learned pt has not been performing HEP to frequency or # reps prescribed. SLP strongly encouraged pt/wife to complete HEP x2/day 15 reps each exercise, at least 4 days/week with occasional 5th day/week. SLP will aim to schedule retest of MBS week of 03/04/24. With HEP pt req'd usual min A initially for mendelsohn, effortful, and chin tuck with towel, and usual min a for entirety of Masako for tongue protrusion. With POs, pt req'd cue once for double hard swallow with bite before taking a sip (he wanted to take a sip after a bite of cracker). Pt stated he was not using swallow precautions for meals and SLP wrote a reminder on the sheet to ensure this occurs.   01/24/24: Pt's speech measured  today at average 71dB. SLP to continue to measure speech volume - loudness could be more dependent upon pt's comfort with the topic rather than a neurological etiology.  Pt req'd rare  min A with effortful, occasional min-mod A for tongue protrusion for Masako, and occasional min A for swallow hold with Mendelsohn. Fatigue noted after 6 reps of effortful, 6 reps of Masako, and 5 reps of Mendelsohn. With POs, pt req'd extra time consistently, and one cue for sip after swallows of a bite of cracker.   01/15/24: With HEP, pt demonstrated fatigue after 5-6 swallows of effortful and of Masako. Ultimately pt req'd rare min A with effortful, usual mod A for tongue protrusion with Masako, usual min A for holding swallow with Mendelsohn, Fatigue noted after 7 reps of Mendelsohn. Pt req'd initial min A however only req'd mod I and consistent extra time to achieve success with following precautions with two peanut butter crackers and water.  Wife states pt doing better both with eating and with HEP at home.  Pt's voice measured today at average 68dB, below WNL. SLP to consider adding loudness retraining tasks to pt's POC.   01/10/24: Pt req'd cues for taking exercises out and not precautions. Req'd SBA with effortful, usual min cues for tongue protrusion with Masako, and iniital mod A faded to independent with procedure with Mendelsohn. SLP noticed pt unable to track reps so SLP asked pt how he was tracking reps and he answered Pat. SLP encouraged him to attempt to make it a joint effort but suspect it will be better for wife to track and him focus on procedure.  With precautions, pt req'd initial assistance with sip after bite, but was independent with this with mod I (written cues) and extra time by session end. SLP verified that pt was not eating with distractions due to losing place with sequencing of precautions.   01/02/24: Pt and wife with questions re: CTAR with towel.  SLP told them to hold towel and for pt to  put chin to floor and press tightly on towel. Pt was not holding towel, but relying on chin to hold towel. Pt and wife vocalized thanks for reiterating holding the towel. With precautions at home, pt is rarely using. SLP suggested no TV while eating - pt is watching with dinner and is using his homemade visual cues but SLP wondered if simplification of this would be helpful. See pt instructions - using this sheet from SLP pt req'd extra time but was mod I with following precautions in quiet environment; SLP remarked this was the least SLP has had to cue pt since practicing precautions in session. Wife agreed. SLP suggested wife and pt take two crackers three times a day in addition to mealtimes for pt to practice precautions. SLP and wife had to cue pt to cont practicing with cracker bites, and cued once when pt asked did I double swallow? SLP shared s/sx aspiration PNA and with mod I pt able to reiterate these to SLP.  12/19/23: Pt described good days as ones where he remembers to do the exercises and they feel good.SABRA Describes a bad day when he does not do the exercises or the exercises are more difficult to accomplish (They don't go as smooth). Today pt req'd usual mod A for proper HEP completion. SLP brought pt's wife back to use as a second set of eyes to watch pt complete HEP at home and aid in memory of HEP details. She will do this - SLP suggested her watching and cueing pt until she no longer has to say anything when he performs HEP. Pt req'd written notes in order to complete swallow precautions today. Mod extra time req'd for the first 3 bites/sips and then min extra time req'd.   12/07/23: SLP provided pt with PROM today - see above for details. With crackers and water pt req'd usual mod cues for alternating solid-liquid and for multiple swallows. He was independent after initial min-mod cues for swallow hold 1/2-1 second. With HEP, pt req'd usual mod A for all exercises faded to occasional  min-mod A, except chin tuck with towel where cues faded to occasional min A. SLP strongly reiterated to pt to have Pat with him to keep him performing HEP correctly.   12/05/23: NEEDS PROM next session. Pt reports he is using written cues for following precautions at mealtimes. He is completing effortful swallow at home. SLP educated pt on procedure for Mendelsohn, Masako, and chin tuck against resistance (with towel). Pt req'd mod A and faded to mod I. Pt to complete x2/day with 15 reps each.   11/23/23: SLP reviewed results and recommendations of MBS with pt and wife. SLP taught pt effortful swallow, and attempted to teach pt Masako but was unable to do so due to time constraints. He was told to complete 15 reps effortful swallow BID. SLP suggested pt try Masako with 1-2 drops of water and if able to swallow with tongue out past lips he should add 15 reps BID to his HEP until next session. SLP will need to add Claudette and CTAR next session.   PATIENT EDUCATION: Education details: see treatment date for details Person educated: Patient Education method: Explanation, Demonstration, Verbal cues, and Handouts Education comprehension: verbalized understanding, returned demonstration, verbal cues required, and needs further education   ASSESSMENT:  CLINICAL IMPRESSION: Patient is a 73 y.o. M who was seen today for treatment of swallowing following MBS 11/01/23,  and weaker voice. See treatment date above for today's date for further details on today's session. Pt still is not completing HEPs as prescribed, not is performing swallow strategies at home. If pt cont with suboptimal completion of HEPs, SLP may suggest follow up MBS be completed by end of SeptemberLeon and Pat reported current sx are memory difficulty, reduced voice volume and strength, disordered gait pattern, left lingual, and labial weakness and bil decr'd velar, lingual, and labial ROM without notable dysarthria, and hypomimia which  improved with cues (x2 separate instances) to exaggerate facial movements.    OBJECTIVE IMPAIRMENTS: include memory, voice disorder, and dysphagia. These impairments are limiting patient from household responsibilities, ADLs/IADLs, effectively communicating at home and in community, and safety when swallowing. Factors affecting potential to achieve goals and functional outcome are ability to learn/carryover information. Patient will benefit from skilled SLP services to address above impairments and improve overall function.  REHAB POTENTIAL: Good   GOALS: Goals reviewed with patient? Yes  SHORT TERM GOALS: Target date: 01/05/24 (due to first ST session 12/24/23)  Pt will complete HEP with rare min A Baseline:  Goal status: partially met  2.  Pt will follow swallowing precautions with rare min A Baseline:  Goal status: met  3.  Pt will tell SLP 3 overt s/sx aspiration PNA with mod I Baseline:  Goal status: Met   LONG TERM GOALS: Target date:  03/15/24  Pt will improve PROM Baseline:  Goal status: INITIAL  2.  Pt will complete HEP with mod I in 3 sessions Baseline:  Goal status: INITIAL  3.  Pt will follow swallowing precautions with mod I in 3 sessions Baseline: 01/15/24 Goal status: INITIAL  4.  Pt will complete PhoRTE exercises with rare min A Baseline:  Goal status: INITIAL  PLAN:  SLP FREQUENCY: 2x/week  SLP DURATION: 8 weeks  PLANNED INTERVENTIONS: Aspiration precaution training, Pharyngeal strengthening exercises, Diet toleration management , Environmental controls, Trials of upgraded texture/liquids, Internal/external aids, SLP instruction and feedback, Compensatory strategies, Patient/family education, [phone] Treatment of speech (30 or 45 min) , [id number] Treatment of swallowing function, and [id number]- Speech Eval Behavioral Qualitative Voice Resonance    [facility], CCC-SLP 03/01/2024, 8:08 AM

## 2024-03-07 ENCOUNTER — Ambulatory Visit

## 2024-03-07 DIAGNOSIS — R499 Unspecified voice and resonance disorder: Secondary | ICD-10-CM

## 2024-03-07 DIAGNOSIS — R1313 Dysphagia, pharyngeal phase: Secondary | ICD-10-CM | POA: Diagnosis not present

## 2024-03-07 NOTE — Therapy (Signed)
 OUTPATIENT SPEECH LANGUAGE PATHOLOGY TREATMENT   Patient Name: Bryan Hart MRN: [id number] DOB:[birth year], 73 y.o., male Today's Date: 03/07/2024  PCP: [name], DO REFERRING PROVIDER: same as PCP  END OF SESSION:  End of Session - 03/07/24 1106     Visit Number 14    Number of Visits 17    Date for SLP Re-Evaluation 03/15/24    SLP Start Time 1105    SLP Stop Time  1145    SLP Time Calculation (min) 40 min    Activity Tolerance Patient tolerated treatment well               Past Medical History:  Diagnosis Date   Arthritis    HLD (hyperlipidemia)    Hypertension    dr  ed green      stress test   12 yrs ago   PVC's (premature ventricular contractions) 12 years ago   stress test done   Sleep apnea    Stroke [facility])    minor stroke 01/2018   Past Surgical History:  Procedure Laterality Date   ANKLE ARTHROSCOPY  06/27/2008   JOINT REPLACEMENT  06/27/2009   rt shoulder rotator cuff repair   knee surgeries  8030,8006   x2   SHOULDER HEMI-ARTHROPLASTY  01/12/2012   Procedure: SHOULDER HEMI-ARTHROPLASTY;  Surgeon: Eva Elsie Herring, MD;  Location: [facility] OR;  Service: Orthopedics;  Laterality: Right;   SHOULDER SURGERY Right 07/2021   TOTAL KNEE ARTHROPLASTY Right 05/31/2022   Procedure: RIGHT TOTAL KNEE ARTHROPLASTY;  Surgeon: [name] Lonni GRADE, MD;  Location: [facility] OR;  Service: Orthopedics;  Laterality: Right;   Patient Active Problem List   Diagnosis Date Noted   Paroxysmal atrial fibrillation (HCC) 04/12/2023   Hypercoagulable state due to paroxysmal atrial fibrillation (HCC) 04/12/2023   Asymptomatic PVCs 07/26/2022   Hyperlipidemia 07/26/2022   Family history of heart disease 07/26/2022   Essential hypertension 07/26/2022   Status post total right knee replacement 05/31/2022   Unilateral primary osteoarthritis, left knee 03/28/2022   Unilateral primary osteoarthritis, right knee 03/28/2022   Ceruminosis, bilateral 08/18/2021   Primary  osteoarthritis of left knee 04/30/2020   Effusion, right knee 03/31/2020   Degenerative arthritis of right knee 03/31/2020   Bilateral primary osteoarthritis of knee 12/27/2018   AV block 07/29/2018   Central sleep apnea 07/29/2018   Cerebrovascular accident [facility]) 07/29/2018   Primary localized osteoarthrosis of shoulder region 01/13/2012      ONSET DATE: at least 5 years ago (Script dated 11/09/23)  REFERRING DIAG: Dysphagia, oropharyngeal phase  THERAPY DIAG:  Dysphagia, pharyngeal phase  Voice disorder  Rationale for Evaluation and Treatment: Rehabilitation  SUBJECTIVE:   SUBJECTIVE STATEMENT:   Pt accompanied by: self  PERTINENT HISTORY: CVA 2019, memory disorder. See above.  PAIN:  Are you having pain? No  FALLS: Has patient fallen in last 6 months?  No   PATIENT GOALS: Improve swallowing  OBJECTIVE:  Note: Objective measures were completed at Evaluation unless otherwise noted. OBJECTIVE:   DIAGNOSTIC FINDINGS:  NEUROIMAGING REPORT STUDY DATE: 07/26/2023 PATIENT NAME: Bryan Hart DOB: 1950-10-08 MRN: [id number]   ORDERING CLINICIAN: Dr. [name] CLINICAL HISTORY: 73  yr old patient evaluated for memory loss COMPARISON FILMS: None EXAM: MRI brain with and without contrast TECHNIQUE: Sagittal T1, axial T1, T2, FLAIR, DWI, SWI, coronal T2 and postcontrast axial and coronal T1 images obtained through the brain CONTRAST: 8 mL IV gadopiclenol  IMAGING SITE:  imaging   FINDINGS:  Brain parenchyma shows mild age-related changes  of chronic small vessel disease and generalized cerebral atrophy.  No structural lesion, tumor or infarct is noted.  Diffusion-weighted imaging negative for acute ischemia.  SWI images do not show significant microhemorrhages.  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  Cortical sulci and gyri show normal  appearance.  Extra-axial (but normal.  Calvarium shows no abnormalities.  Orbits appear unremarkable paranasal sinuses show mild chronic mucosal thickening.  The pituitary gland and cerebellar tonsils appear normal. The visualized portion of the upper cervical spine shows only minor degenerative changes.  Flow-voids of large vessels are intact and circulation appear to be patent.  Postcontrast images do not result in abnormal areas of enhancement.   IMPRESSION: MRI scan of the brain with and without contrast showing mild age-related changes of chronic small vessel disease and generalized cerebral atrophy.  HISTORY OF PRESENT ILLNESS - neurology December '24 UPDATE (06/12/23, VRP): Since last visit, more progression of symptoms. Decline in sense of direction. Sometimes does not recognize own wife. Memory decline has continued.  INSTRUMENTAL SWALLOW STUDY FINDINGS (MBSS) 11/01/23 Objective swallow impairments:  Pt demonstrates a moderate pharyngeal dysphagia with a DIGEST severity score of 1, characterized primarily by poor efficiency. Pt has relatively good oral control and timely swallow intiation. He has about 25% of residue remaining in vallculae post swallow (spills to pyriform) with solids and liquids. There is decreased base of tongue tension, decreased duration and ROM with laryngeal elevation and hyoid excursion. There is also an appearance of bony protrusions along the cervical spine partially impeding bolus flow through PES. Esophageal sweep showed barium table hesitate in multiple locations with retrograde flow of puree bolus given to aid in transit. Throughout study pt had only rare trace penetration to the vocal folds with intermittent throat clear response. Pt benefitted from a Mendelsohn maneuver to increase bolus propulsion and duration of PES opening. Pt cued to prolong swallow with increased force. Quantity of residue was reduced, but not eliminated. Second swallow also needed, as well as  following solids with liquids. Strongly encouraged pt to f/u with OP SLP to target both swallowing and voice. Pts hypophonia improved with cued for breath support. Pt to continue regular/thin diet.    Factors that may increase risk of adverse event in presence of aspiration Noe & Lianne 2021): Factors that may increase risk of adverse event in presence of aspiration Noe & Lianne 2021): Weak cough   DIGEST Swallow Severity Rating*             Safety:              Efficiency:             Overall Pharyngeal Swallow Severity:  1: mild; 2: moderate; 3: severe; 4: profound  *The Dynamic Imaging Grade of Swallowing Toxicity is standardized for the head and neck cancer population, however, demonstrates promising clinical applications across populations to standardize the clinical rating of pharyngeal swallow safety and severity.   Objective recommended compensations: Recommendations: PO diet PO Diet Recommendation: Regular; Thin liquids (Level 0) Liquid Administration via: Cup; Straw Medication Administration: Whole meds with puree Supervision: Patient able to self-feed Swallowing strategies  : Swallow hold (Mendelsohn maneuver); effortful swallow; Follow solids with liquids; Multiple dry swallows after each bite/sip Postural changes: Position pt fully upright for meals Oral care recommendations: Oral care BID (2x/day)   PATIENT REPORTED OUTCOME MEASURES (PROM): EAT-10: pt scored himself 18/40 with lower  scores indicating less impact of swallowing sx on QoL. 4 (severe problem) was answered for swallowing solids takes extra effort, the pleasure of eating is affected by my swallowing, and 3 was answered for swallowing pills takes extra effort, and swallowing is stressful.                                                                                                                             TREATMENT DATE:   03/07/24: Pt reports completing each set of exercises once a day  consistently, and sometimes BID.  SLP left message for suggestion of follow up MBS with pt's referring MD today. - told to fax script for [phone]. SWALLOW: Initial min cues necessary for each exercise. Additionally, Meribeth req'd usual min-mod A for tongue protrusion with Masako, and occasional min-mod A for hold time with Claudette. Pt req'd usual mod A with swallow strategies, faded to occasional min A by cycle 4 of bite/sip. SPEECH: PhoRTE exercises today. Arius performed /a/ with initial min cues faded to independent for incr'd volume and usual min-mod cues for open mouth with average 85dB, numbers completed with initial total A, faded to usual mod cues for loudness with average 83dB, high-pitched sentences with initial cues for procedure with average 83dB. And low-pitched sentences with initial mod cues necessary for low pitch with average 82dB. SLP suggested MBS at the end of this month. Will contact referring MD.  03/01/24: Pt reports that HEPs are not always done BID.  SPEECH: PhoRTE exercises today. [place] performed /a/ with initial min cues faded to independent for volume and occasional min mod cues for open mouth with average 86dB, numbers completed with initial cues to use numbers and not ohhhh, and consistent mod cues for loudness with average 82dB, high-pitched sentences with initial cues for procedure with average 83dB. And low-pitched sentences with occasional min cues necessary for low pitch with average 83dB. SWALLOW: Initial min cues necessary for each exercise. Raynald req'd usual min A for tongue protrusion with Masako and for hold time with [employer]. Pt req'd usual mod A with swallow strategies.   02/22/24: SPEECH: SLP assessed pt's wife completing exercises with pt. Pt req'd occasional faded to rare min cues for louder productions of /a/ average 84 dB (average began at 79dB). Needed usual mod cues faded to rare min=mod cues for keeping productions shorter in length, and usual mod cues  for loudness. Sentences: usual mod cues for natural sounding sentences, faded to rare min-mod cues. SLP reiterated to wife to have pt's volume be higher in all tasks, and to cue pt mid-production as well as give models - she was cueing with words only.  SWALLOWING: With swallow HEP pt req'd occasional cues faded to rare min A with all for procedure. SLP suggested pt use finger touch for Mendelsohn to monitor thyroid  movement. With POs, pt req'd mod A consistently. I'm not doing this at home, pt stated. SLP warned pt that he should do so  if he wants to be the safest possible while eating, per MBS.   02/12/24: SPEECH: SLP reviewed pt's PhoRTE with him, requiring usual mod A for proper procedure. Pt has been completing them with wife most days once/day. SLP strongly encouraged pt/wife to complete at least 5/7 days/week BID.  SWALLOW: Then SLP reviewed pt's dysphagia HEP with him and he req'd occasional min-mod A for proper procedure. SLP demonstrated that a drop of water can make it easier to complete HEP and told pt and wife this today and had pt complete 6 reps using drop of water. Pt took sip x1 so he req'd cue for one drop.   02/05/24: SWALLOW: SLP reminded pt that he should not eat and watch TV and explained rationale (See S). He has been more consistent with adhering to precautions with all POs since last visit.  Today with precautions pt req'd SBA with double swallow with solids before a sip.  SPEECH: PhoRTE exercises introduced today. SLP brought Pat back to go through exercises with pt at home should he not recall how to complete.  Average /a/ 86dB, 15 seconds; average loudness with numbers - 84dB, with occasional min A for pitch variation in higher frequencies; with high pitched sentences initial mod cue for higher pitch, and one cue for loudness after 4 reps, average 84dB. Lower pitched sentences, occasional mod cues for loudness faded to min cues, average 84dB. Cues necessary for lower pitch voice  after 6 reps. If little progress with voice volumle after 4-6 weeks, SLP may recommend ENT referral to referring MD. Pt req'd consistent mod cues with models intermittently during this task. Pat told SLP she was comfortable assisting pt at home.   01/31/24: SLP to add PhoRTE to pt's regimen next session. SLP learned pt has not been performing HEP to frequency or # reps prescribed. SLP strongly encouraged pt/wife to complete HEP x2/day 15 reps each exercise, at least 4 days/week with occasional 5th day/week. SLP will aim to schedule retest of MBS week of 03/04/24. With HEP pt req'd usual min A initially for mendelsohn, effortful, and chin tuck with towel, and usual min a for entirety of Masako for tongue protrusion. With POs, pt req'd cue once for double hard swallow with bite before taking a sip (he wanted to take a sip after a bite of cracker). Pt stated he was not using swallow precautions for meals and SLP wrote a reminder on the sheet to ensure this occurs.   01/24/24: Pt's speech measured today at average 71dB. SLP to continue to measure speech volume - loudness could be more dependent upon pt's comfort with the topic rather than a neurological etiology.  Pt req'd rare  min A with effortful, occasional min-mod A for tongue protrusion for Masako, and occasional min A for swallow hold with Mendelsohn. Fatigue noted after 6 reps of effortful, 6 reps of Masako, and 5 reps of Mendelsohn. With POs, pt req'd extra time consistently, and one cue for sip after swallows of a bite of cracker.   01/15/24: With HEP, pt demonstrated fatigue after 5-6 swallows of effortful and of Masako. Ultimately pt req'd rare min A with effortful, usual mod A for tongue protrusion with Masako, usual min A for holding swallow with Mendelsohn, Fatigue noted after 7 reps of Mendelsohn. Pt req'd initial min A however only req'd mod I and consistent extra time to achieve success with following precautions with two peanut butter crackers and  water.  Wife states pt doing better both  with eating and with HEP at home.  Pt's voice measured today at average 68dB, below WNL. SLP to consider adding loudness retraining tasks to pt's POC.   01/10/24: Pt req'd cues for taking exercises out and not precautions. Req'd SBA with effortful, usual min cues for tongue protrusion with Masako, and iniital mod A faded to independent with procedure with Mendelsohn. SLP noticed pt unable to track reps so SLP asked pt how he was tracking reps and he answered Pat. SLP encouraged him to attempt to make it a joint effort but suspect it will be better for wife to track and him focus on procedure.  With precautions, pt req'd initial assistance with sip after bite, but was independent with this with mod I (written cues) and extra time by session end. SLP verified that pt was not eating with distractions due to losing place with sequencing of precautions.   01/02/24: Pt and wife with questions re: CTAR with towel. SLP told them to hold towel and for pt to put chin to floor and press tightly on towel. Pt was not holding towel, but relying on chin to hold towel. Pt and wife vocalized thanks for reiterating holding the towel. With precautions at home, pt is rarely using. SLP suggested no TV while eating - pt is watching with dinner and is using his homemade visual cues but SLP wondered if simplification of this would be helpful. See pt instructions - using this sheet from SLP pt req'd extra time but was mod I with following precautions in quiet environment; SLP remarked this was the least SLP has had to cue pt since practicing precautions in session. Wife agreed. SLP suggested wife and pt take two crackers three times a day in addition to mealtimes for pt to practice precautions. SLP and wife had to cue pt to cont practicing with cracker bites, and cued once when pt asked did I double swallow? SLP shared s/sx aspiration PNA and with mod I pt able to reiterate these to  SLP.  12/19/23: Pt described good days as ones where he remembers to do the exercises and they feel good.SABRA Describes a bad day when he does not do the exercises or the exercises are more difficult to accomplish (They don't go as smooth). Today pt req'd usual mod A for proper HEP completion. SLP brought pt's wife back to use as a second set of eyes to watch pt complete HEP at home and aid in memory of HEP details. She will do this - SLP suggested her watching and cueing pt until she no longer has to say anything when he performs HEP. Pt req'd written notes in order to complete swallow precautions today. Mod extra time req'd for the first 3 bites/sips and then min extra time req'd.   12/07/23: SLP provided pt with PROM today - see above for details. With crackers and water pt req'd usual mod cues for alternating solid-liquid and for multiple swallows. He was independent after initial min-mod cues for swallow hold 1/2-1 second. With HEP, pt req'd usual mod A for all exercises faded to occasional min-mod A, except chin tuck with towel where cues faded to occasional min A. SLP strongly reiterated to pt to have Pat with him to keep him performing HEP correctly.   12/05/23: NEEDS PROM next session. Pt reports he is using written cues for following precautions at mealtimes. He is completing effortful swallow at home. SLP educated pt on procedure for [place], Masako, and chin tuck against resistance (  with towel). Pt req'd mod A and faded to mod I. Pt to complete x2/day with 15 reps each.   11/23/23: SLP reviewed results and recommendations of MBS with pt and wife. SLP taught pt effortful swallow, and attempted to teach pt Masako but was unable to do so due to time constraints. He was told to complete 15 reps effortful swallow BID. SLP suggested pt try Masako with 1-2 drops of water and if able to swallow with tongue out past lips he should add 15 reps BID to his HEP until next session. SLP will need to add  Claudette and CTAR next session.   PATIENT EDUCATION: Education details: see treatment date for details Person educated: Patient Education method: Explanation, Demonstration, Verbal cues, and Handouts Education comprehension: verbalized understanding, returned demonstration, verbal cues required, and needs further education   ASSESSMENT:  CLINICAL IMPRESSION: Patient is a 73 y.o. M who was seen today for treatment of swallowing following MBS 11/01/23, and weaker voice. SLP suggested follow up MBS today - called and LM with MDD's RN, Kellie, to fax script to [phone] if agreed. See treatment date above for today's date for further details on today's session. Pt still is not completing HEPs as prescribed, not is performing swallow strategies at home. If pt cont with suboptimal completion of HEPs, SLP may suggest follow up MBS be completed by end of September. Whitaker and [name] reported current sx are memory difficulty, reduced voice volume and strength, disordered gait pattern, left lingual, and labial weakness and bil decr'd velar, lingual, and labial ROM without notable dysarthria, and hypomimia which improved with cues (x2 separate instances) to exaggerate facial movements.    OBJECTIVE IMPAIRMENTS: include memory, voice disorder, and dysphagia. These impairments are limiting patient from household responsibilities, ADLs/IADLs, effectively communicating at home and in community, and safety when swallowing. Factors affecting potential to achieve goals and functional outcome are ability to learn/carryover information. Patient will benefit from skilled SLP services to address above impairments and improve overall function.  REHAB POTENTIAL: Good   GOALS: Goals reviewed with patient? Yes  SHORT TERM GOALS: Target date: 01/05/24 (due to first ST session 12/24/23)  Pt will complete HEP with rare min A Baseline:  Goal status: partially met  2.  Pt will follow swallowing precautions with rare  min A Baseline:  Goal status: met  3.  Pt will tell SLP 3 overt s/sx aspiration PNA with mod I Baseline:  Goal status: Met   LONG TERM GOALS: Target date:  03/15/24  Pt will improve PROM Baseline:  Goal status: INITIAL  2.  Pt will complete HEP with mod I in 3 sessions Baseline:  Goal status: INITIAL  3.  Pt will follow swallowing precautions with mod I in 3 sessions Baseline: 01/15/24 Goal status: INITIAL  4.  Pt will complete PhoRTE exercises with rare min A Baseline:  Goal status: INITIAL  PLAN:  SLP FREQUENCY: 2x/week  SLP DURATION: 8 weeks  PLANNED INTERVENTIONS: Aspiration precaution training, Pharyngeal strengthening exercises, Diet toleration management , Environmental controls, Trials of upgraded texture/liquids, Internal/external aids, SLP instruction and feedback, Compensatory strategies, Patient/family education, [phone] Treatment of speech (30 or 45 min) , [id number] Treatment of swallowing function, and [id number]- Speech Eval Behavioral Qualitative Voice Resonance    [facility], CCC-SLP 03/07/2024, 11:10 AM

## 2024-03-08 ENCOUNTER — Other Ambulatory Visit (HOSPITAL_COMMUNITY): Payer: Self-pay | Admitting: *Deleted

## 2024-03-08 DIAGNOSIS — R1310 Dysphagia, unspecified: Secondary | ICD-10-CM

## 2024-03-13 ENCOUNTER — Ambulatory Visit

## 2024-03-13 DIAGNOSIS — R499 Unspecified voice and resonance disorder: Secondary | ICD-10-CM

## 2024-03-13 DIAGNOSIS — R1313 Dysphagia, pharyngeal phase: Secondary | ICD-10-CM | POA: Diagnosis not present

## 2024-03-13 DIAGNOSIS — R1310 Dysphagia, unspecified: Secondary | ICD-10-CM

## 2024-03-13 NOTE — Therapy (Signed)
 OUTPATIENT SPEECH LANGUAGE PATHOLOGY TREATMENT   Patient Name: Bryan Hart MRN: [id number] DOB:Feb 16, 1951, 73 y.o., male Today's Date: 03/13/2024  PCP: Bryan Skates, DO REFERRING PROVIDER: same as PCP  END OF SESSION:  End of Session - 03/13/24 1146     Visit Number 15    Number of Visits 17    Date for SLP Re-Evaluation 03/15/24    SLP Start Time 1105    SLP Stop Time  1146    SLP Time Calculation (min) 41 min    Activity Tolerance Patient tolerated treatment well                Past Medical History:  Diagnosis Date   Arthritis    HLD (hyperlipidemia)    Hypertension    dr  ed green      stress test   12 yrs ago   PVC's (premature ventricular contractions) 12 years ago   stress test done   Sleep apnea    Stroke [facility])    minor stroke 01/2018   Past Surgical History:  Procedure Laterality Date   ANKLE ARTHROSCOPY  06/27/2008   JOINT REPLACEMENT  06/27/2009   rt shoulder rotator cuff repair   knee surgeries  8030,8006   x2   SHOULDER HEMI-ARTHROPLASTY  01/12/2012   Procedure: SHOULDER HEMI-ARTHROPLASTY;  Surgeon: Bryan Elsie Herring, MD;  Location: [facility] OR;  Service: Orthopedics;  Laterality: Right;   SHOULDER SURGERY Right 07/2021   TOTAL KNEE ARTHROPLASTY Right 05/31/2022   Procedure: RIGHT TOTAL KNEE ARTHROPLASTY;  Surgeon: Bryan Lonni GRADE, MD;  Location: [facility] OR;  Service: Orthopedics;  Laterality: Right;   Patient Active Problem List   Diagnosis Date Noted   Paroxysmal atrial fibrillation (HCC) 04/12/2023   Hypercoagulable state due to paroxysmal atrial fibrillation (HCC) 04/12/2023   Asymptomatic PVCs 07/26/2022   Hyperlipidemia 07/26/2022   Family history of heart disease 07/26/2022   Essential hypertension 07/26/2022   Status post total right knee replacement 05/31/2022   Unilateral primary osteoarthritis, left knee 03/28/2022   Unilateral primary osteoarthritis, right knee 03/28/2022   Ceruminosis, bilateral 08/18/2021   Primary  osteoarthritis of left knee 04/30/2020   Effusion, right knee 03/31/2020   Degenerative arthritis of right knee 03/31/2020   Bilateral primary osteoarthritis of knee 12/27/2018   AV block 07/29/2018   Central sleep apnea 07/29/2018   Cerebrovascular accident [facility]) 07/29/2018   Primary localized osteoarthrosis of shoulder region 01/13/2012      ONSET DATE: at least 5 years ago (Script dated 11/09/23)  REFERRING DIAG: Dysphagia, oropharyngeal phase  THERAPY DIAG:  Dysphagia, unspecified type  Voice disorder  Rationale for Evaluation and Treatment: Rehabilitation  SUBJECTIVE:   SUBJECTIVE STATEMENT: Pt has follow up MBS set for 03/14/24.  Pt accompanied by: self  PERTINENT HISTORY: CVA 2019, memory disorder. See above.  PAIN:  Are you having pain? No  FALLS: Has patient fallen in last 6 months?  No   PATIENT GOALS: Improve swallowing  OBJECTIVE:  Note: Objective measures were completed at Evaluation unless otherwise noted. OBJECTIVE:   DIAGNOSTIC FINDINGS:  NEUROIMAGING REPORT STUDY DATE: 07/26/2023 PATIENT NAME: Bryan Hart DOB: 14-Dec-1950 MRN: [id number]   ORDERING CLINICIAN: Dr. [name] CLINICAL HISTORY: 73  yr old patient evaluated for memory loss COMPARISON FILMS: None EXAM: MRI brain with and without contrast TECHNIQUE: Sagittal T1, axial T1, T2, FLAIR, DWI, SWI, coronal T2 and postcontrast axial and coronal T1 images obtained through the brain CONTRAST: 8 mL IV gadopiclenol  IMAGING SITE: [facility] imaging  FINDINGS:  Brain parenchyma shows mild age-related changes of chronic small vessel disease and generalized cerebral atrophy.  No structural lesion, tumor or infarct is noted.  Diffusion-weighted imaging negative for acute ischemia.  SWI images do not show significant microhemorrhages.  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   Cortical sulci and gyri show normal appearance.  Extra-axial (but normal.  Calvarium shows no abnormalities.  Orbits appear unremarkable paranasal sinuses show mild chronic mucosal thickening.  The pituitary gland and cerebellar tonsils appear normal. The visualized portion of the upper cervical spine shows only minor degenerative changes.  Flow-voids of large vessels are intact and circulation appear to be patent.  Postcontrast images do not result in abnormal areas of enhancement.   IMPRESSION: MRI scan of the brain with and without contrast showing mild age-related changes of chronic small vessel disease and generalized cerebral atrophy.  HISTORY OF PRESENT ILLNESS - neurology December '24 UPDATE (06/12/23, [facility]): Since last visit, more progression of symptoms. Decline in sense of direction. Sometimes does not recognize own wife. Memory decline has continued.  INSTRUMENTAL SWALLOW STUDY FINDINGS (MBSS) 11/01/23 Objective swallow impairments:  Pt demonstrates a moderate pharyngeal dysphagia with a DIGEST severity score of 1, characterized primarily by poor efficiency. Pt has relatively good oral control and timely swallow intiation. He has about 25% of residue remaining in vallculae post swallow (spills to pyriform) with solids and liquids. There is decreased base of tongue tension, decreased duration and ROM with laryngeal elevation and hyoid excursion. There is also an appearance of bony protrusions along the cervical spine partially impeding bolus flow through PES. Esophageal sweep showed barium table hesitate in multiple locations with retrograde flow of puree bolus given to aid in transit. Throughout study pt had only rare trace penetration to the vocal folds with intermittent throat clear response. Pt benefitted from a Mendelsohn maneuver to increase bolus propulsion and duration of PES opening. Pt cued to prolong swallow with increased force. Quantity of residue was reduced, but not eliminated.  Second swallow also needed, as well as following solids with liquids. Strongly encouraged pt to f/u with OP SLP to target both swallowing and voice. Pts hypophonia improved with cued for breath support. Pt to continue regular/thin diet.    Factors that may increase risk of adverse event in presence of aspiration Noe & Lianne 2021): Factors that may increase risk of adverse event in presence of aspiration Noe & Lianne 2021): Weak cough   DIGEST Swallow Severity Rating*             Safety:              Efficiency:             Overall Pharyngeal Swallow Severity:  1: mild; 2: moderate; 3: severe; 4: profound  *The Dynamic Imaging Hart of Swallowing Toxicity is standardized for the head and neck cancer population, however, demonstrates promising clinical applications across populations to standardize the clinical rating of pharyngeal swallow safety and severity.   Objective recommended compensations: Recommendations: PO diet PO Diet Recommendation: Regular; Thin liquids (Level 0) Liquid Administration via: Cup; Straw Medication Administration: Whole meds with puree Supervision: Patient able to self-feed Swallowing strategies  : Swallow hold (Mendelsohn maneuver); effortful swallow; Follow solids with liquids; Multiple dry swallows after each bite/sip Postural changes: Position pt fully upright for meals Oral care recommendations: Oral care BID (2x/day)   PATIENT REPORTED OUTCOME MEASURES (  PROM): EAT-10: pt scored himself 18/40 with lower scores indicating less impact of swallowing sx on QoL. 4 (severe problem) was answered for swallowing solids takes extra effort, the pleasure of eating is affected by my swallowing, and 3 was answered for swallowing pills takes extra effort, and swallowing is stressful.                                                                                                                             TREATMENT DATE:   03/13/24: SWALLOW: FOLLOW UP MBS  TOMORROW. Pt performed dysphagia HEP with mod I except Masako - one cue needed for tongue protrusion. Pt demonstrated some fatigue with Mendelsohn. Today he req'd occasional min A for procedure for precautions (bite - two hard swallows, sip-two hard swallows then repeat). Independent with small bites and sips.  SPEECH: Pt req'd usual min-mod A with procedure for PhoRTE. /a/ performed with initial min cues faded to independent for open mouth with average 84dB, numbers completed with initial total A for procedure, faded to mod I with average 84dB, high-pitched sentences with occasional min A for high pitch with average 83dB. And low-pitched sentences with initial min cue necessary for low pitch with average 83dB.   03/07/24: Pt reports completing each set of exercises once a day consistently, and sometimes BID.  SLP left message for suggestion of follow up MBS with pt's referring MD today. - told to fax script for [phone]. SWALLOW: Initial min cues necessary for each exercise. Additionally, Meribeth req'd usual min-mod A for tongue protrusion with Masako, and occasional min-mod A for hold time with Claudette. Pt req'd usual mod A with swallow strategies, faded to occasional min A by cycle 4 of bite/sip. SPEECH: PhoRTE exercises today. Edrei performed /a/ with initial min cues faded to independent for incr'd volume and usual min-mod cues for open mouth with average 85dB, numbers completed with initial total A, faded to usual mod cues for loudness with average 83dB, high-pitched sentences with initial cues for procedure with average 83dB. And low-pitched sentences with initial mod cues necessary for low pitch with average 82dB. SLP suggested MBS at the end of this month. Will contact referring MD.  03/01/24: Pt reports that HEPs are not always done BID.  SPEECH: PhoRTE exercises today. Wendell performed /a/ with initial min cues faded to independent for volume and occasional min mod cues for open mouth with average  86dB, numbers completed with initial cues to use numbers and not ohhhh, and consistent mod cues for loudness with average 82dB, high-pitched sentences with initial cues for procedure with average 83dB. And low-pitched sentences with occasional min cues necessary for low pitch with average 83dB. SWALLOW: Initial min cues necessary for each exercise. Abe req'd usual min A for tongue protrusion with Masako and for hold time with [employer]. Pt req'd usual mod A with swallow strategies.   02/22/24: SPEECH: SLP assessed pt's wife completing exercises with pt. Pt req'd occasional faded to rare  min cues for louder productions of /a/ average 84 dB (average began at 79dB). Needed usual mod cues faded to rare min=mod cues for keeping productions shorter in length, and usual mod cues for loudness. Sentences: usual mod cues for natural sounding sentences, faded to rare min-mod cues. SLP reiterated to wife to have pt's volume be higher in all tasks, and to cue pt mid-production as well as give models - she was cueing with words only.  SWALLOWING: With swallow HEP pt req'd occasional cues faded to rare min A with all for procedure. SLP suggested pt use finger touch for Mendelsohn to monitor thyroid  movement. With POs, pt req'd mod A consistently. I'm not doing this at home, pt stated. SLP warned pt that he should do so if he wants to be the safest possible while eating, per MBS.   02/12/24: SPEECH: SLP reviewed pt's PhoRTE with him, requiring usual mod A for proper procedure. Pt has been completing them with wife most days once/day. SLP strongly encouraged pt/wife to complete at least 5/7 days/week BID.  SWALLOW: Then SLP reviewed pt's dysphagia HEP with him and he req'd occasional min-mod A for proper procedure. SLP demonstrated that a drop of water can make it easier to complete HEP and told pt and wife this today and had pt complete 6 reps using drop of water. Pt took sip x1 so he req'd cue for one drop.   02/05/24:  SWALLOW: SLP reminded pt that he should not eat and watch TV and explained rationale (See S). He has been more consistent with adhering to precautions with all POs since last visit.  Today with precautions pt req'd SBA with double swallow with solids before a sip.  SPEECH: PhoRTE exercises introduced today. SLP brought Pat back to go through exercises with pt at home should he not recall how to complete.  Average /a/ 86dB, 15 seconds; average loudness with numbers - 84dB, with occasional min A for pitch variation in higher frequencies; with high pitched sentences initial mod cue for higher pitch, and one cue for loudness after 4 reps, average 84dB. Lower pitched sentences, occasional mod cues for loudness faded to min cues, average 84dB. Cues necessary for lower pitch voice after 6 reps. If little progress with voice volumle after 4-6 weeks, SLP may recommend ENT referral to referring MD. Pt req'd consistent mod cues with models intermittently during this task. Pat told SLP she was comfortable assisting pt at home.   01/31/24: SLP to add PhoRTE to pt's regimen next session. SLP learned pt has not been performing HEP to frequency or # reps prescribed. SLP strongly encouraged pt/wife to complete HEP x2/day 15 reps each exercise, at least 4 days/week with occasional 5th day/week. SLP will aim to schedule retest of MBS week of 03/04/24. With HEP pt req'd usual min A initially for mendelsohn, effortful, and chin tuck with towel, and usual min a for entirety of Masako for tongue protrusion. With POs, pt req'd cue once for double hard swallow with bite before taking a sip (he wanted to take a sip after a bite of cracker). Pt stated he was not using swallow precautions for meals and SLP wrote a reminder on the sheet to ensure this occurs.   01/24/24: Pt's speech measured today at average 71dB. SLP to continue to measure speech volume - loudness could be more dependent upon pt's comfort with the topic rather than a  neurological etiology.  Pt req'd rare  min A with effortful, occasional min-mod  A for tongue protrusion for Masako, and occasional min A for swallow hold with Mendelsohn. Fatigue noted after 6 reps of effortful, 6 reps of Masako, and 5 reps of Mendelsohn. With POs, pt req'd extra time consistently, and one cue for sip after swallows of a bite of cracker.   01/15/24: With HEP, pt demonstrated fatigue after 5-6 swallows of effortful and of Masako. Ultimately pt req'd rare min A with effortful, usual mod A for tongue protrusion with Masako, usual min A for holding swallow with Mendelsohn, Fatigue noted after 7 reps of Mendelsohn. Pt req'd initial min A however only req'd mod I and consistent extra time to achieve success with following precautions with two peanut butter crackers and water.  Wife states pt doing better both with eating and with HEP at home.  Pt's voice measured today at average 68dB, below WNL. SLP to consider adding loudness retraining tasks to pt's POC.   01/10/24: Pt req'd cues for taking exercises out and not precautions. Req'd SBA with effortful, usual min cues for tongue protrusion with Masako, and iniital mod A faded to independent with procedure with Mendelsohn. SLP noticed pt unable to track reps so SLP asked pt how he was tracking reps and he answered Pat. SLP encouraged him to attempt to make it a joint effort but suspect it will be better for wife to track and him focus on procedure.  With precautions, pt req'd initial assistance with sip after bite, but was independent with this with mod I (written cues) and extra time by session end. SLP verified that pt was not eating with distractions due to losing place with sequencing of precautions.   01/02/24: Pt and wife with questions re: CTAR with towel. SLP told them to hold towel and for pt to put chin to floor and press tightly on towel. Pt was not holding towel, but relying on chin to hold towel. Pt and wife vocalized thanks for  reiterating holding the towel. With precautions at home, pt is rarely using. SLP suggested no TV while eating - pt is watching with dinner and is using his homemade visual cues but SLP wondered if simplification of this would be helpful. See pt instructions - using this sheet from SLP pt req'd extra time but was mod I with following precautions in quiet environment; SLP remarked this was the least SLP has had to cue pt since practicing precautions in session. Wife agreed. SLP suggested wife and pt take two crackers three times a day in addition to mealtimes for pt to practice precautions. SLP and wife had to cue pt to cont practicing with cracker bites, and cued once when pt asked did I double swallow? SLP shared s/sx aspiration PNA and with mod I pt able to reiterate these to SLP.  12/19/23: Pt described good days as ones where he remembers to do the exercises and they feel good.SABRA Describes a bad day when he does not do the exercises or the exercises are more difficult to accomplish (They don't go as smooth). Today pt req'd usual mod A for proper HEP completion. SLP brought pt's wife back to use as a second set of eyes to watch pt complete HEP at home and aid in memory of HEP details. She will do this - SLP suggested her watching and cueing pt until she no longer has to say anything when he performs HEP. Pt req'd written notes in order to complete swallow precautions today. Mod extra time req'd for the first 3  bites/sips and then min extra time req'd.   12/07/23: SLP provided pt with PROM today - see above for details. With crackers and water pt req'd usual mod cues for alternating solid-liquid and for multiple swallows. He was independent after initial min-mod cues for swallow hold 1/2-1 second. With HEP, pt req'd usual mod A for all exercises faded to occasional min-mod A, except chin tuck with towel where cues faded to occasional min A. SLP strongly reiterated to pt to have Pat with him to keep him  performing HEP correctly.   12/05/23: NEEDS PROM next session. Pt reports he is using written cues for following precautions at mealtimes. He is completing effortful swallow at home. SLP educated pt on procedure for Mendelsohn, Masako, and chin tuck against resistance (with towel). Pt req'd mod A and faded to mod I. Pt to complete x2/day with 15 reps each.   11/23/23: SLP reviewed results and recommendations of MBS with pt and wife. SLP taught pt effortful swallow, and attempted to teach pt Masako but was unable to do so due to time constraints. He was told to complete 15 reps effortful swallow BID. SLP suggested pt try Masako with 1-2 drops of water and if able to swallow with tongue out past lips he should add 15 reps BID to his HEP until next session. SLP will need to add Claudette and CTAR next session.   PATIENT EDUCATION: Education details: see treatment date for details Person educated: Patient Education method: Explanation, Demonstration, Verbal cues, and Handouts Education comprehension: verbalized understanding, returned demonstration, verbal cues required, and needs further education   ASSESSMENT:  CLINICAL IMPRESSION: Patient is a 73 y.o. M who was seen today for treatment of swallowing following MBS 11/01/23, and weaker voice. SLP adjusted pt's LTGs 2-4 based upon progress thus far. See treatment date above for today's date for further details on today's session. Pt still is not completing HEPs as prescribed, but reports he is performing swallow strategies at home with help from wife. Clearance and [name] reported current sx are memory difficulty, reduced voice volume and strength, disordered gait pattern, left lingual, and labial weakness and bil decr'd velar, lingual, and labial ROM without notable dysarthria, and hypomimia which improved with cues (x2 separate instances) to exaggerate facial movements.    OBJECTIVE IMPAIRMENTS: include memory, voice disorder, and dysphagia. These  impairments are limiting patient from household responsibilities, ADLs/IADLs, effectively communicating at home and in community, and safety when swallowing. Factors affecting potential to achieve goals and functional outcome are ability to learn/carryover information. Patient will benefit from skilled SLP services to address above impairments and improve overall function.  REHAB POTENTIAL: Good   GOALS: Goals reviewed with patient? Yes  SHORT TERM GOALS: Target date: 01/05/24 (due to first ST session 12/24/23)  Pt will complete HEP with rare min A Baseline:  Goal status: partially met  2.  Pt will follow swallowing precautions with rare min A Baseline:  Goal status: met  3.  Pt will tell SLP 3 overt s/sx aspiration PNA with mod I Baseline:  Goal status: Met   LONG TERM GOALS: Target date:  03/15/24  Pt will improve PROM Baseline:  Goal status: INITIAL  2.  Pt will complete dysaphgia HEP with rare min A in 3 sessions Baseline:  Goal status: modified  3.  Pt will follow swallowing precautions with occasional min A in 3 sessions Baseline: 01/15/24 Goal status: modified  4.  Pt will complete PhoRTE exercises with occasional min A Baseline:  Goal status: modified  PLAN:  SLP FREQUENCY: 2x/week  SLP DURATION: 8 weeks  PLANNED INTERVENTIONS: Aspiration precaution training, Pharyngeal strengthening exercises, Diet toleration management , Environmental controls, Trials of upgraded texture/liquids, Internal/external aids, SLP instruction and feedback, Compensatory strategies, Patient/family education, [phone] Treatment of speech (30 or 45 min) , 92526 Treatment of swallowing function, and [id number]- Speech Eval Behavioral Qualitative Voice Resonance    [facility], CCC-SLP 03/13/2024, 12:10 PM

## 2024-03-14 ENCOUNTER — Ambulatory Visit (HOSPITAL_COMMUNITY)
Admission: RE | Admit: 2024-03-14 | Discharge: 2024-03-14 | Disposition: A | Discharge status: Home / Self Care | Source: Ambulatory Visit | Attending: Internal Medicine | Admitting: Internal Medicine

## 2024-03-14 DIAGNOSIS — R059 Cough, unspecified: Secondary | ICD-10-CM | POA: Diagnosis not present

## 2024-03-14 DIAGNOSIS — R638 Other symptoms and signs concerning food and fluid intake: Secondary | ICD-10-CM | POA: Diagnosis not present

## 2024-03-14 DIAGNOSIS — R1314 Dysphagia, pharyngoesophageal phase: Secondary | ICD-10-CM | POA: Insufficient documentation

## 2024-03-14 DIAGNOSIS — R1310 Dysphagia, unspecified: Secondary | ICD-10-CM

## 2024-03-14 DIAGNOSIS — R1313 Dysphagia, pharyngeal phase: Secondary | ICD-10-CM | POA: Diagnosis present

## 2024-03-14 NOTE — Evaluation (Signed)
 Modified Barium Swallow Study  Patient Details  Name: Bryan Hart MRN: [id number] Date of Birth: December 26, 1950  Today's Date: 03/14/2024  Modified Barium Swallow completed.  Full report located under Chart Review in the Imaging Section.  History of Present Illness 73 yo male presenting for repeat MBS. Initial MBS was May 2025, after which he started working with OP SLP for dysphagia and voice. Pt believes that his swallowing is improved, although his wife notes that he does not do his HEP often. PMH includes: minor stroke (2019), mild dementia   Clinical Impression Pt shows subtle changes from previous MBS, with DIGEST score still 1. Oral phase remains functional and esophageal phase without retention today. Amount of pharyngeal residue remains similar in comparison to prior images, and with Mendelsohn maneuver not as effective. Use of a second swallow seems to be more efficient at reduceding pharyngeal residue, and pt seems to follow the instructions more smoothly. It does seem like he has a little improvement in airway protection this time. He does not penetrate thin liquids via cup until consumed with the barium tablet. He did have a single instance of trace aspiration via straw, but appropriate airway protection mechanisms were noted with pt clearing his throat to eject aspirates (PAS 6). This seems to be related to improvements with laryngeal elevation and epiglottic inversion. Recommend that pt continue with regular solids and thin liquids with OP SLP follow up. Encouraged pt to do his HEP provided by OP SLP.  DIGEST Swallow Severity Rating*  Safety: 1  Efficiency:1  Overall Pharyngeal Swallow Severity: 1 1: mild; 2: moderate; 3: severe; 4: profound  *The Dynamic Imaging Grade of Swallowing Toxicity is standardized for the head and neck cancer population, however, demonstrates promising clinical applications across populations to standardize the clinical rating of pharyngeal swallow safety  and severity.   Factors that may increase risk of adverse event in presence of aspiration Noe & Lianne 2021): Reduced cognitive function  Swallow Evaluation Recommendations Recommendations: PO diet PO Diet Recommendation: Regular;Thin liquids (Level 0) Liquid Administration via: Cup;Straw Medication Administration: Whole meds with liquid Supervision: Patient able to self-feed Swallowing strategies  : Slow rate;Small bites/sips Postural changes: Position pt fully upright for meals Oral care recommendations: Oral care BID (2x/day)      Leita SAILOR., M.A. CCC-SLP Acute Rehabilitation Services Office: [phone]  Secure chat preferred  03/14/2024,12:06 PM

## 2024-03-15 DIAGNOSIS — I4891 Unspecified atrial fibrillation: Secondary | ICD-10-CM | POA: Diagnosis not present

## 2024-03-15 DIAGNOSIS — I959 Hypotension, unspecified: Secondary | ICD-10-CM | POA: Diagnosis not present

## 2024-03-15 DIAGNOSIS — I493 Ventricular premature depolarization: Secondary | ICD-10-CM | POA: Diagnosis not present

## 2024-03-15 DIAGNOSIS — Z23 Encounter for immunization: Secondary | ICD-10-CM | POA: Diagnosis not present

## 2024-03-15 DIAGNOSIS — I10 Essential (primary) hypertension: Secondary | ICD-10-CM | POA: Diagnosis not present

## 2024-03-15 DIAGNOSIS — R251 Tremor, unspecified: Secondary | ICD-10-CM | POA: Diagnosis not present

## 2024-03-15 DIAGNOSIS — R410 Disorientation, unspecified: Secondary | ICD-10-CM | POA: Diagnosis not present

## 2024-03-15 DIAGNOSIS — R1310 Dysphagia, unspecified: Secondary | ICD-10-CM | POA: Diagnosis not present

## 2024-03-15 DIAGNOSIS — G4733 Obstructive sleep apnea (adult) (pediatric): Secondary | ICD-10-CM | POA: Diagnosis not present

## 2024-03-15 DIAGNOSIS — F325 Major depressive disorder, single episode, in full remission: Secondary | ICD-10-CM | POA: Diagnosis not present

## 2024-03-15 DIAGNOSIS — E785 Hyperlipidemia, unspecified: Secondary | ICD-10-CM | POA: Diagnosis not present

## 2024-03-15 DIAGNOSIS — F03A0 Unspecified dementia, mild, without behavioral disturbance, psychotic disturbance, mood disturbance, and anxiety: Secondary | ICD-10-CM | POA: Diagnosis not present

## 2024-03-28 ENCOUNTER — Other Ambulatory Visit: Payer: Self-pay | Admitting: Physician Assistant

## 2024-03-28 DIAGNOSIS — Z01818 Encounter for other preprocedural examination: Secondary | ICD-10-CM

## 2024-04-01 ENCOUNTER — Ambulatory Visit

## 2024-04-03 ENCOUNTER — Ambulatory Visit: Attending: Internal Medicine

## 2024-04-03 DIAGNOSIS — H0100B Unspecified blepharitis left eye, upper and lower eyelids: Secondary | ICD-10-CM | POA: Diagnosis not present

## 2024-04-03 DIAGNOSIS — H524 Presbyopia: Secondary | ICD-10-CM | POA: Diagnosis not present

## 2024-04-03 DIAGNOSIS — R1313 Dysphagia, pharyngeal phase: Secondary | ICD-10-CM | POA: Insufficient documentation

## 2024-04-03 DIAGNOSIS — H43813 Vitreous degeneration, bilateral: Secondary | ICD-10-CM | POA: Diagnosis not present

## 2024-04-03 DIAGNOSIS — R499 Unspecified voice and resonance disorder: Secondary | ICD-10-CM | POA: Insufficient documentation

## 2024-04-03 DIAGNOSIS — H26493 Other secondary cataract, bilateral: Secondary | ICD-10-CM | POA: Diagnosis not present

## 2024-04-03 DIAGNOSIS — H04123 Dry eye syndrome of bilateral lacrimal glands: Secondary | ICD-10-CM | POA: Diagnosis not present

## 2024-04-03 DIAGNOSIS — H0100A Unspecified blepharitis right eye, upper and lower eyelids: Secondary | ICD-10-CM | POA: Diagnosis not present

## 2024-04-03 NOTE — Patient Instructions (Signed)
     CONTINUE TO DO YOUR EXERCISES three times a week, 15 reps each   Signs you may have developed trouble with your swallowing:  Items you used to eat or drink well,  you now have difficulty passing through the throat or are choking or coughing  when  you eat or drink them  You are clearing your throat often when you are eating or drinking  You have a wet voice when you are eating or drinking  If you have one or more of these signs contact your doctor, he/she may want you to go for a swallow test

## 2024-04-03 NOTE — Therapy (Unsigned)
 OUTPATIENT SPEECH LANGUAGE PATHOLOGY TREATMENT/ RENEWAL-DISCHARGE   Patient Name: Bryan Hart MRN: [id number] DOB:1951-05-28, 73 y.o., male Today's Date: 04/04/2024  PCP: [name], DO REFERRING PROVIDER: same as PCP  END OF SESSION:  End of Session - 04/04/24 1018     Visit Number 16    Number of Visits 17    Date for Recertification  04/03/24    SLP Start Time 1535    SLP Stop Time  1615    SLP Time Calculation (min) 40 min    Activity Tolerance Patient tolerated treatment well                 Past Medical History:  Diagnosis Date   Arthritis    HLD (hyperlipidemia)    Hypertension    dr  ed green      stress test   12 yrs ago   PVC's (premature ventricular contractions) 12 years ago   stress test done   Sleep apnea    Stroke [facility])    minor stroke 01/2018   Past Surgical History:  Procedure Laterality Date   ANKLE ARTHROSCOPY  06/27/2008   JOINT REPLACEMENT  06/27/2009   rt shoulder rotator cuff repair   knee surgeries  8030,8006   x2   SHOULDER HEMI-ARTHROPLASTY  01/12/2012   Procedure: SHOULDER HEMI-ARTHROPLASTY;  Surgeon: Eva Elsie Herring, MD;  Location: [facility];  Service: Orthopedics;  Laterality: Right;   SHOULDER SURGERY Right 07/2021   TOTAL KNEE ARTHROPLASTY Right 05/31/2022   Procedure: RIGHT TOTAL KNEE ARTHROPLASTY;  Surgeon: [name] Lonni GRADE, MD;  Location: [facility] OR;  Service: Orthopedics;  Laterality: Right;   Patient Active Problem List   Diagnosis Date Noted   Paroxysmal atrial fibrillation (HCC) 04/12/2023   Hypercoagulable state due to paroxysmal atrial fibrillation (HCC) 04/12/2023   Asymptomatic PVCs 07/26/2022   Hyperlipidemia 07/26/2022   Family history of heart disease 07/26/2022   Essential hypertension 07/26/2022   Status post total right knee replacement 05/31/2022   Unilateral primary osteoarthritis, left knee 03/28/2022   Unilateral primary osteoarthritis, right knee 03/28/2022   Ceruminosis, bilateral  08/18/2021   Primary osteoarthritis of left knee 04/30/2020   Effusion, right knee 03/31/2020   Degenerative arthritis of right knee 03/31/2020   Bilateral primary osteoarthritis of knee 12/27/2018   AV block 07/29/2018   Central sleep apnea 07/29/2018   Cerebrovascular accident [facility]) 07/29/2018   Primary localized osteoarthrosis of shoulder region 01/13/2012    SPEECH THERAPY RECERT-DISCHARGE SUMMARY  Visits from Start of Care: 16  Current functional level related to goals / functional outcomes: Pt will be seen with current LTGs enforced today and will then be d/c'd.  See below. Pt had MBS 03/14/24 which showed generally the same results as initial MBS with some slight improvement. SLP told pt to cont to complete dysphagia HEP x2/week at 15 reps each to maintain swallow strength, and to double swallow with all POs.  Pt will continue to perform PhoRTE if desired,  however SLP believes pt's loudness depends somewhat on pt's confidence level on his topic of conversation. He has not performed PhoRTE to the recommended level so it is difficult for SLP to judge if PhoRTE has been beneficial for pt at this time. Overt s/sx oropharyngeal dysphagia were provided to pt and wife and they were told Andon could be referred again at any time with a new script.   Remaining deficits: Dysphagia, and reduced voice volume possibly due to behavioral factors more than anatomical/physiologic factors  Education / Equipment: See individual  therapy session notes.  Patient agrees to discharge. Patient goals were partially met. Patient is being discharged due to maximized rehab potential. .     ONSET DATE: at least 5 years ago (Script dated 11/09/23)  REFERRING DIAG: Dysphagia, oropharyngeal phase  THERAPY DIAG:  Voice disorder  Dysphagia, pharyngeal phase  Rationale for Evaluation and Treatment: Rehabilitation  SUBJECTIVE:   SUBJECTIVE STATEMENT: Pt has follow up MBS set for 03/14/24.  Pt  accompanied by: self  PERTINENT HISTORY: CVA 2019, memory disorder. See above.  PAIN:  Are you having pain? No  FALLS: Has patient fallen in last 6 months?  No   PATIENT GOALS: Improve swallowing  OBJECTIVE:  Note: Objective measures were completed at Evaluation unless otherwise noted. OBJECTIVE:   DIAGNOSTIC FINDINGS:  NEUROIMAGING REPORT STUDY DATE: 07/26/2023 PATIENT NAME: Bryan Hart DOB: October 03, 1950 MRN: [id number]   ORDERING CLINICIAN: Dr. [name] CLINICAL HISTORY: 73  yr old patient evaluated for memory loss COMPARISON FILMS: None EXAM: MRI brain with and without contrast TECHNIQUE: Sagittal T1, axial T1, T2, FLAIR, DWI, SWI, coronal T2 and postcontrast axial and coronal T1 images obtained through the brain CONTRAST: 8 mL IV gadopiclenol  IMAGING SITE: [facility]   FINDINGS:  Brain parenchyma shows mild age-related changes of chronic small vessel disease and generalized cerebral atrophy.  No structural lesion, tumor or infarct is noted.  Diffusion-weighted imaging negative for acute ischemia.  SWI images do not show significant microhemorrhages.  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  Cortical sulci and gyri show normal appearance.  Extra-axial (but normal.  Calvarium shows no abnormalities.  Orbits appear unremarkable paranasal sinuses show mild chronic mucosal thickening.  The pituitary gland and cerebellar tonsils appear normal. The visualized portion of the upper cervical spine shows only minor degenerative changes.  Flow-voids of large vessels are intact and circulation appear to be patent.  Postcontrast images do not result in abnormal areas of enhancement.   IMPRESSION: MRI scan of the brain with and without contrast showing mild age-related changes of chronic small vessel disease and generalized cerebral atrophy.  HISTORY OF PRESENT ILLNESS  - neurology December '24 UPDATE (06/12/23, [facility]): Since last visit, more progression of symptoms. Decline in sense of direction. Sometimes does not recognize own wife. Memory decline has continued.  INSTRUMENTAL SWALLOW STUDY FINDINGS (MBSS)  03/14/24 Clinical Impression: Pt shows subtle changes from previous MBS, with DIGEST score still 1. Oral phase remains functional and esophageal phase without retention today. Amount of pharyngeal residue remains similar in comparison to prior images, and with Mendelsohn maneuver not as effective. Use of a second swallow seems to be more efficient at reduceding pharyngeal residue, and pt seems to follow the instructions more smoothly. It does seem like he has a little improvement in airway protection this time. He does not penetrate thin liquids via cup until consumed with the barium tablet. He did have a single instance of trace aspiration via straw, but appropriate airway protection mechanisms were noted with pt clearing his throat to eject aspirates (PAS 6). This seems to be related to improvements with laryngeal elevation and epiglottic inversion. Recommend that pt continue with regular solids and thin liquids with OP SLP follow up. Encouraged pt to do his HEP provided by OP SLP.   DIGEST Swallow Severity Rating*            Safety:  1            Efficiency:1            Overall Pharyngeal Swallow Severity: 1 1: mild; 2: moderate; 3: severe; 4: profound  *The Dynamic Imaging Grade of Swallowing Toxicity is standardized for the head and neck cancer population, however, demonstrates promising clinical applications across populations to standardize the clinical rating of pharyngeal swallow safety and severity.   Factors that may increase risk of adverse event in presence of aspiration Noe & Lianne 2021): Factors that may increase risk of adverse event in presence of aspiration Noe & Lianne 2021): Reduced cognitive function    Recommendations/Plan: Swallowing Evaluation Recommendations Swallowing Evaluation Recommendations Recommendations: PO diet PO Diet Recommendation: Regular; Thin liquids (Level 0) Liquid Administration via: Cup; Straw Medication Administration: Whole meds with liquid Supervision: Patient able to self-feed Swallowing strategies  : Slow rate; Small bites/sips Postural changes: Position pt fully upright for meals Oral care recommendations: Oral care BID (2x/day)   11/01/23 Objective swallow impairments:  Pt demonstrates a moderate pharyngeal dysphagia with a DIGEST severity score of 1, characterized primarily by poor efficiency. Pt has relatively good oral control and timely swallow intiation. He has about 25% of residue remaining in vallculae post swallow (spills to pyriform) with solids and liquids. There is decreased base of tongue tension, decreased duration and ROM with laryngeal elevation and hyoid excursion. There is also an appearance of bony protrusions along the cervical spine partially impeding bolus flow through PES. Esophageal sweep showed barium table hesitate in multiple locations with retrograde flow of puree bolus given to aid in transit. Throughout study pt had only rare trace penetration to the vocal folds with intermittent throat clear response. Pt benefitted from a Mendelsohn maneuver to increase bolus propulsion and duration of PES opening. Pt cued to prolong swallow with increased force. Quantity of residue was reduced, but not eliminated. Second swallow also needed, as well as following solids with liquids. Strongly encouraged pt to f/u with OP SLP to target both swallowing and voice. Pts hypophonia improved with cued for breath support. Pt to continue regular/thin diet.    Factors that may increase risk of adverse event in presence of aspiration Noe & Lianne 2021): Factors that may increase risk of adverse event in presence of aspiration Noe & Lianne 2021): Weak  cough   DIGEST Swallow Severity Rating*             Safety:              Efficiency:             Overall Pharyngeal Swallow Severity:  1: mild; 2: moderate; 3: severe; 4: profound  *The Dynamic Imaging Grade of Swallowing Toxicity is standardized for the head and neck cancer population, however, demonstrates promising clinical applications across populations to standardize the clinical rating of pharyngeal swallow safety and severity.   Objective recommended compensations: Recommendations: PO diet PO Diet Recommendation: Regular; Thin liquids (Level 0) Liquid Administration via: Cup; Straw Medication Administration: Whole meds with puree Supervision: Patient able to self-feed Swallowing strategies  : Swallow hold (Mendelsohn maneuver); effortful swallow; Follow solids with liquids; Multiple dry swallows after each bite/sip Postural changes: Position pt fully upright for meals Oral care recommendations: Oral care BID (2x/day)   PATIENT REPORTED OUTCOME MEASURES (PROM): EAT-10: pt scored himself 18/40 with lower scores indicating less impact of swallowing sx on QoL. 4 (severe problem) was answered for swallowing solids takes extra effort, the pleasure of eating is  affected by my swallowing, and 3 was answered for swallowing pills takes extra effort, and swallowing is stressful.                                                                                                                             TREATMENT DATE:   04/03/24: SLP reviewed MBS results and pt followed precautions with occasional min A (double swallows with all bites/sips). SLP then reviewed pt's swallow HEP in which he req'd rare min A for tongue protrusion. [name] confirmed she still has to cue pt for this on a regular basis when she performs these with Meribeth. Pt will cont to perform PhoRTE as he is able/desires. SLP told pt/wife that his frequency with PhoRTE hasn't been frequent enough to assess if the exercises have  benefited him. [name] does not think pt will cont with these. SLP shared that SLP suspects part of pt's softer voice volume is pt feeling less-confident about his conversational topic. Pat and pt agreed.   03/13/24: SWALLOW: FOLLOW UP MBS TOMORROW. Pt performed dysphagia HEP with mod I except Masako - one cue needed for tongue protrusion. Pt demonstrated some fatigue with Mendelsohn. Today he req'd occasional min A for procedure for precautions (bite - two hard swallows, sip-two hard swallows then repeat). Independent with small bites and sips.  SPEECH: Pt req'd usual min-mod A with procedure for PhoRTE. /a/ performed with initial min cues faded to independent for open mouth with average 84dB, numbers completed with initial total A for procedure, faded to mod I with average 84dB, high-pitched sentences with occasional min A for high pitch with average 83dB. And low-pitched sentences with initial min cue necessary for low pitch with average 83dB.   03/07/24: Pt reports completing each set of exercises once a day consistently, and sometimes BID.  SLP left message for suggestion of follow up MBS with pt's referring MD today. - told to fax script for [phone]. SWALLOW: Initial min cues necessary for each exercise. Additionally, Meribeth req'd usual min-mod A for tongue protrusion with Masako, and occasional min-mod A for hold time with Claudette. Pt req'd usual mod A with swallow strategies, faded to occasional min A by cycle 4 of bite/sip. SPEECH: PhoRTE exercises today. Zohar performed /a/ with initial min cues faded to independent for incr'd volume and usual min-mod cues for open mouth with average 85dB, numbers completed with initial total A, faded to usual mod cues for loudness with average 83dB, high-pitched sentences with initial cues for procedure with average 83dB. And low-pitched sentences with initial mod cues necessary for low pitch with average 82dB. SLP suggested MBS at the end of this month. Will  contact referring MD.  03/01/24: Pt reports that HEPs are not always done BID.  SPEECH: PhoRTE exercises today. Beulah performed /a/ with initial min cues faded to independent for volume and occasional min mod cues for open mouth with average 86dB, numbers completed with initial cues to use numbers and  not ohhhh, and consistent mod cues for loudness with average 82dB, high-pitched sentences with initial cues for procedure with average 83dB. And low-pitched sentences with occasional min cues necessary for low pitch with average 83dB. SWALLOW: Initial min cues necessary for each exercise. Ayaz req'd usual min A for tongue protrusion with Masako and for hold time with [employer]. Pt req'd usual mod A with swallow strategies.   02/22/24: SPEECH: SLP assessed pt's wife completing exercises with pt. Pt req'd occasional faded to rare min cues for louder productions of /a/ average 84 dB (average began at 79dB). Needed usual mod cues faded to rare min=mod cues for keeping productions shorter in length, and usual mod cues for loudness. Sentences: usual mod cues for natural sounding sentences, faded to rare min-mod cues. SLP reiterated to wife to have pt's volume be higher in all tasks, and to cue pt mid-production as well as give models - she was cueing with words only.  SWALLOWING: With swallow HEP pt req'd occasional cues faded to rare min A with all for procedure. SLP suggested pt use finger touch for Mendelsohn to monitor thyroid  movement. With POs, pt req'd mod A consistently. I'm not doing this at home, pt stated. SLP warned pt that he should do so if he wants to be the safest possible while eating, per MBS.   02/12/24: SPEECH: SLP reviewed pt's PhoRTE with him, requiring usual mod A for proper procedure. Pt has been completing them with wife most days once/day. SLP strongly encouraged pt/wife to complete at least 5/7 days/week BID.  SWALLOW: Then SLP reviewed pt's dysphagia HEP with him and he req'd occasional  min-mod A for proper procedure. SLP demonstrated that a drop of water can make it easier to complete HEP and told pt and wife this today and had pt complete 6 reps using drop of water. Pt took sip x1 so he req'd cue for one drop.   02/05/24: SWALLOW: SLP reminded pt that he should not eat and watch TV and explained rationale (See S). He has been more consistent with adhering to precautions with all POs since last visit.  Today with precautions pt req'd SBA with double swallow with solids before a sip.  SPEECH: PhoRTE exercises introduced today. SLP brought Pat back to go through exercises with pt at home should he not recall how to complete.  Average /a/ 86dB, 15 seconds; average loudness with numbers - 84dB, with occasional min A for pitch variation in higher frequencies; with high pitched sentences initial mod cue for higher pitch, and one cue for loudness after 4 reps, average 84dB. Lower pitched sentences, occasional mod cues for loudness faded to min cues, average 84dB. Cues necessary for lower pitch voice after 6 reps. If little progress with voice volumle after 4-6 weeks, SLP may recommend ENT referral to referring MD. Pt req'd consistent mod cues with models intermittently during this task. Pat told SLP she was comfortable assisting pt at home.   01/31/24: SLP to add PhoRTE to pt's regimen next session. SLP learned pt has not been performing HEP to frequency or # reps prescribed. SLP strongly encouraged pt/wife to complete HEP x2/day 15 reps each exercise, at least 4 days/week with occasional 5th day/week. SLP will aim to schedule retest of MBS week of 03/04/24. With HEP pt req'd usual min A initially for mendelsohn, effortful, and chin tuck with towel, and usual min a for entirety of Masako for tongue protrusion. With POs, pt req'd cue once for double hard swallow  with bite before taking a sip (he wanted to take a sip after a bite of cracker). Pt stated he was not using swallow precautions for meals and  SLP wrote a reminder on the sheet to ensure this occurs.   01/24/24: Pt's speech measured today at average 71dB. SLP to continue to measure speech volume - loudness could be more dependent upon pt's comfort with the topic rather than a neurological etiology.  Pt req'd rare  min A with effortful, occasional min-mod A for tongue protrusion for Masako, and occasional min A for swallow hold with Mendelsohn. Fatigue noted after 6 reps of effortful, 6 reps of Masako, and 5 reps of Mendelsohn. With POs, pt req'd extra time consistently, and one cue for sip after swallows of a bite of cracker.   01/15/24: With HEP, pt demonstrated fatigue after 5-6 swallows of effortful and of Masako. Ultimately pt req'd rare min A with effortful, usual mod A for tongue protrusion with Masako, usual min A for holding swallow with Mendelsohn, Fatigue noted after 7 reps of Mendelsohn. Pt req'd initial min A however only req'd mod I and consistent extra time to achieve success with following precautions with two peanut butter crackers and water.  Wife states pt doing better both with eating and with HEP at home.  Pt's voice measured today at average 68dB, below WNL. SLP to consider adding loudness retraining tasks to pt's POC.   01/10/24: Pt req'd cues for taking exercises out and not precautions. Req'd SBA with effortful, usual min cues for tongue protrusion with Masako, and iniital mod A faded to independent with procedure with Mendelsohn. SLP noticed pt unable to track reps so SLP asked pt how he was tracking reps and he answered Pat. SLP encouraged him to attempt to make it a joint effort but suspect it will be better for wife to track and him focus on procedure.  With precautions, pt req'd initial assistance with sip after bite, but was independent with this with mod I (written cues) and extra time by session end. SLP verified that pt was not eating with distractions due to losing place with sequencing of precautions.    01/02/24: Pt and wife with questions re: CTAR with towel. SLP told them to hold towel and for pt to put chin to floor and press tightly on towel. Pt was not holding towel, but relying on chin to hold towel. Pt and wife vocalized thanks for reiterating holding the towel. With precautions at home, pt is rarely using. SLP suggested no TV while eating - pt is watching with dinner and is using his homemade visual cues but SLP wondered if simplification of this would be helpful. See pt instructions - using this sheet from SLP pt req'd extra time but was mod I with following precautions in quiet environment; SLP remarked this was the least SLP has had to cue pt since practicing precautions in session. Wife agreed. SLP suggested wife and pt take two crackers three times a day in addition to mealtimes for pt to practice precautions. SLP and wife had to cue pt to cont practicing with cracker bites, and cued once when pt asked did I double swallow? SLP shared s/sx aspiration PNA and with mod I pt able to reiterate these to SLP.  12/19/23: Pt described good days as ones where he remembers to do the exercises and they feel good.SABRA Describes a bad day when he does not do the exercises or the exercises are more difficult to accomplish (  They don't go as smooth). Today pt req'd usual mod A for proper HEP completion. SLP brought pt's wife back to use as a second set of eyes to watch pt complete HEP at home and aid in memory of HEP details. She will do this - SLP suggested her watching and cueing pt until she no longer has to say anything when he performs HEP. Pt req'd written notes in order to complete swallow precautions today. Mod extra time req'd for the first 3 bites/sips and then min extra time req'd.   12/07/23: SLP provided pt with PROM today - see above for details. With crackers and water pt req'd usual mod cues for alternating solid-liquid and for multiple swallows. He was independent after initial min-mod  cues for swallow hold 1/2-1 second. With HEP, pt req'd usual mod A for all exercises faded to occasional min-mod A, except chin tuck with towel where cues faded to occasional min A. SLP strongly reiterated to pt to have Pat with him to keep him performing HEP correctly.   12/05/23: NEEDS PROM next session. Pt reports he is using written cues for following precautions at mealtimes. He is completing effortful swallow at home. SLP educated pt on procedure for Mendelsohn, Masako, and chin tuck against resistance (with towel). Pt req'd mod A and faded to mod I. Pt to complete x2/day with 15 reps each.   11/23/23: SLP reviewed results and recommendations of MBS with pt and wife. SLP taught pt effortful swallow, and attempted to teach pt Masako but was unable to do so due to time constraints. He was told to complete 15 reps effortful swallow BID. SLP suggested pt try Masako with 1-2 drops of water and if able to swallow with tongue out past lips he should add 15 reps BID to his HEP until next session. SLP will need to add Claudette and CTAR next session.   PATIENT EDUCATION: Education details: see treatment date for details Person educated: Patient Education method: Explanation, Demonstration, Verbal cues, and Handouts Education comprehension: verbalized understanding, returned demonstration, verbal cues required, and needs further education   ASSESSMENT:  CLINICAL IMPRESSION: Patient is a 73 y.o. M who was seen today for treatment of swallowing following MBS 03/14/24. Pt's weaker voice persists. See treatment date above for today's date for further details on today's session. Pt still is not completing HEPs as prescribed. He will be d/c'd today after this session.  OBJECTIVE IMPAIRMENTS: include memory, voice disorder, and dysphagia. These impairments are limiting patient from household responsibilities, ADLs/IADLs, effectively communicating at home and in community, and safety when  swallowing. Factors affecting potential to achieve goals and functional outcome are ability to learn/carryover information. Patient will benefit from skilled SLP services to address above impairments and improve overall function.  REHAB POTENTIAL: Good   GOALS: Goals reviewed with patient? Yes  SHORT TERM GOALS: Target date: 01/05/24 (due to first ST session 12/24/23)  Pt will complete HEP with rare min A Baseline:  Goal status: partially met  2.  Pt will follow swallowing precautions with rare min A Baseline:  Goal status: met  3.  Pt will tell SLP 3 overt s/sx aspiration PNA with mod I Baseline:  Goal status: Met   LONG TERM GOALS: Target date:   04/03/24  Pt will improve PROM Baseline:  Goal status: deferred- pt left before SLP could administer  2.  Pt will complete dysaphgia HEP with rare min A in 3 sessions Baseline: 04/03/24 Goal status: partially met  3.  Pt will follow swallowing precautions with occasional min A in 3 sessions Baseline: 01/15/24, 04/03/24 Goal status: partially met  4.  Pt will complete PhoRTE exercises with occasional min A Baseline:  Goal status: not met  PLAN:   See pt for ST today and then d/c   PLANNED INTERVENTIONS: Aspiration precaution training, Pharyngeal strengthening exercises, Diet toleration management , Environmental controls, Trials of upgraded texture/liquids, Internal/external aids, SLP instruction and feedback, Compensatory strategies, Patient/family education, [phone] Treatment of speech (30 or 45 min) , 92526 Treatment of swallowing function, and 07475- Speech Eval Behavioral Qualitative Voice Resonance    [facility], CCC-SLP 04/04/2024, 10:19 AM

## 2024-04-09 ENCOUNTER — Ambulatory Visit

## 2024-04-09 DIAGNOSIS — H26491 Other secondary cataract, right eye: Secondary | ICD-10-CM | POA: Diagnosis not present

## 2024-04-15 ENCOUNTER — Ambulatory Visit

## 2024-04-16 ENCOUNTER — Ambulatory Visit (HOSPITAL_COMMUNITY): Admit: 2024-04-16 | Admitting: Orthopaedic Surgery

## 2024-04-16 DIAGNOSIS — H26492 Other secondary cataract, left eye: Secondary | ICD-10-CM | POA: Diagnosis not present

## 2024-04-16 SURGERY — ARTHROPLASTY, KNEE, TOTAL
Anesthesia: Spinal | Site: Knee | Laterality: Left

## 2024-04-26 ENCOUNTER — Other Ambulatory Visit (HOSPITAL_COMMUNITY): Payer: Self-pay | Admitting: *Deleted

## 2024-04-26 MED ORDER — APIXABAN 5 MG PO TABS: 5.0000 mg | ORAL_TABLET | Freq: Two times a day (BID) | ORAL | 5 refills | Status: AC

## 2024-04-29 ENCOUNTER — Emergency Department (HOSPITAL_COMMUNITY)

## 2024-04-29 ENCOUNTER — Encounter (HOSPITAL_COMMUNITY): Payer: Self-pay | Admitting: Pharmacy Technician

## 2024-04-29 ENCOUNTER — Telehealth (HOSPITAL_COMMUNITY): Payer: Self-pay | Admitting: Internal Medicine

## 2024-04-29 ENCOUNTER — Encounter: Admitting: Orthopaedic Surgery

## 2024-04-29 ENCOUNTER — Encounter: Payer: Self-pay | Admitting: Radiology

## 2024-04-29 ENCOUNTER — Other Ambulatory Visit: Payer: Self-pay

## 2024-04-29 ENCOUNTER — Inpatient Hospital Stay (HOSPITAL_COMMUNITY)
Admission: EM | Admit: 2024-04-29 | Discharge: 2024-05-06 | DRG: 308 | Disposition: A | Discharge status: Home Health | Attending: Family Medicine | Admitting: Family Medicine

## 2024-04-29 DIAGNOSIS — R1312 Dysphagia, oropharyngeal phase: Secondary | ICD-10-CM | POA: Diagnosis present

## 2024-04-29 DIAGNOSIS — G250 Essential tremor: Secondary | ICD-10-CM | POA: Diagnosis present

## 2024-04-29 DIAGNOSIS — I10 Essential (primary) hypertension: Secondary | ICD-10-CM | POA: Diagnosis present

## 2024-04-29 DIAGNOSIS — I4891 Unspecified atrial fibrillation: Secondary | ICD-10-CM | POA: Diagnosis not present

## 2024-04-29 DIAGNOSIS — Z96611 Presence of right artificial shoulder joint: Secondary | ICD-10-CM | POA: Diagnosis present

## 2024-04-29 DIAGNOSIS — F0150 Vascular dementia without behavioral disturbance: Secondary | ICD-10-CM | POA: Diagnosis present

## 2024-04-29 DIAGNOSIS — I480 Paroxysmal atrial fibrillation: Secondary | ICD-10-CM | POA: Diagnosis not present

## 2024-04-29 DIAGNOSIS — G4733 Obstructive sleep apnea (adult) (pediatric): Secondary | ICD-10-CM | POA: Diagnosis present

## 2024-04-29 DIAGNOSIS — G4731 Primary central sleep apnea: Secondary | ICD-10-CM

## 2024-04-29 DIAGNOSIS — W19XXXA Unspecified fall, initial encounter: Secondary | ICD-10-CM

## 2024-04-29 DIAGNOSIS — M25462 Effusion, left knee: Secondary | ICD-10-CM | POA: Diagnosis present

## 2024-04-29 DIAGNOSIS — I493 Ventricular premature depolarization: Secondary | ICD-10-CM | POA: Diagnosis not present

## 2024-04-29 DIAGNOSIS — D6869 Other thrombophilia: Secondary | ICD-10-CM | POA: Diagnosis present

## 2024-04-29 DIAGNOSIS — J690 Pneumonitis due to inhalation of food and vomit: Secondary | ICD-10-CM | POA: Diagnosis present

## 2024-04-29 DIAGNOSIS — I959 Hypotension, unspecified: Secondary | ICD-10-CM | POA: Diagnosis not present

## 2024-04-29 DIAGNOSIS — R42 Dizziness and giddiness: Secondary | ICD-10-CM | POA: Diagnosis not present

## 2024-04-29 DIAGNOSIS — I4819 Other persistent atrial fibrillation: Principal | ICD-10-CM | POA: Diagnosis present

## 2024-04-29 DIAGNOSIS — I4892 Unspecified atrial flutter: Secondary | ICD-10-CM | POA: Diagnosis present

## 2024-04-29 DIAGNOSIS — Z96651 Presence of right artificial knee joint: Secondary | ICD-10-CM | POA: Diagnosis present

## 2024-04-29 DIAGNOSIS — Z8249 Family history of ischemic heart disease and other diseases of the circulatory system: Secondary | ICD-10-CM

## 2024-04-29 DIAGNOSIS — Z91048 Other nonmedicinal substance allergy status: Secondary | ICD-10-CM

## 2024-04-29 DIAGNOSIS — S0990XA Unspecified injury of head, initial encounter: Secondary | ICD-10-CM | POA: Diagnosis not present

## 2024-04-29 DIAGNOSIS — R918 Other nonspecific abnormal finding of lung field: Secondary | ICD-10-CM | POA: Diagnosis not present

## 2024-04-29 DIAGNOSIS — R531 Weakness: Secondary | ICD-10-CM | POA: Diagnosis not present

## 2024-04-29 DIAGNOSIS — G9341 Metabolic encephalopathy: Secondary | ICD-10-CM | POA: Diagnosis present

## 2024-04-29 DIAGNOSIS — Z7901 Long term (current) use of anticoagulants: Secondary | ICD-10-CM

## 2024-04-29 DIAGNOSIS — F05 Delirium due to known physiological condition: Secondary | ICD-10-CM | POA: Diagnosis present

## 2024-04-29 DIAGNOSIS — I639 Cerebral infarction, unspecified: Secondary | ICD-10-CM | POA: Diagnosis present

## 2024-04-29 DIAGNOSIS — Z87891 Personal history of nicotine dependence: Secondary | ICD-10-CM

## 2024-04-29 DIAGNOSIS — E785 Hyperlipidemia, unspecified: Secondary | ICD-10-CM | POA: Diagnosis present

## 2024-04-29 DIAGNOSIS — I69318 Other symptoms and signs involving cognitive functions following cerebral infarction: Secondary | ICD-10-CM

## 2024-04-29 DIAGNOSIS — Z79899 Other long term (current) drug therapy: Secondary | ICD-10-CM

## 2024-04-29 DIAGNOSIS — I517 Cardiomegaly: Secondary | ICD-10-CM | POA: Diagnosis not present

## 2024-04-29 DIAGNOSIS — Z823 Family history of stroke: Secondary | ICD-10-CM

## 2024-04-29 DIAGNOSIS — R55 Syncope and collapse: Secondary | ICD-10-CM | POA: Diagnosis not present

## 2024-04-29 LAB — BASIC METABOLIC PANEL WITH GFR
Anion gap: 10 (ref 5–15)
BUN: 21 mg/dL (ref 8–23)
CO2: 25 mmol/L (ref 22–32)
Calcium: 8.7 mg/dL — ABNORMAL LOW (ref 8.9–10.3)
Chloride: 103 mmol/L (ref 98–111)
Creatinine, Ser: 1.15 mg/dL (ref 0.61–1.24)
GFR, Estimated: >60 mL/min (ref >60)
Glucose, Bld: 85 mg/dL (ref 70–99)
Potassium: 4.4 mmol/L (ref 3.5–5.1)
Sodium: 138 mmol/L (ref 135–145)

## 2024-04-29 LAB — CBC
HCT: 42.4 % (ref 39.0–52.0)
Hemoglobin: 13.6 g/dL (ref 13.0–17.0)
MCH: 31.1 pg (ref 26.0–34.0)
MCHC: 32.1 g/dL (ref 30.0–36.0)
MCV: 97 fL (ref 80.0–100.0)
Platelets: 208 K/uL (ref 150–400)
RBC: 4.37 MIL/uL (ref 4.22–5.81)
RDW: 12.9 % (ref 11.5–15.5)
WBC: 6.8 K/uL (ref 4.0–10.5)
nRBC: 0 % (ref 0.0–0.2)

## 2024-04-29 LAB — TROPONIN I (HIGH SENSITIVITY)
Troponin I (High Sensitivity): 6 ng/L (ref <18)
Troponin I (High Sensitivity): 6 ng/L (ref <18)

## 2024-04-29 MED ORDER — METOPROLOL TARTRATE 25 MG PO TABS
37.5000 mg | ORAL_TABLET | Freq: Two times a day (BID) | ORAL | Status: DC
  Administered 2024-04-30 – 2024-05-03 (×3): 37.5 mg via ORAL
  Filled 2024-04-29 (×6): qty 2

## 2024-04-29 MED ORDER — METOPROLOL TARTRATE 5 MG/5ML IV SOLN
5.0000 mg | INTRAVENOUS | Status: AC | PRN
  Administered 2024-04-29 – 2024-04-30 (×3): 5 mg via INTRAVENOUS
  Filled 2024-04-29 (×3): qty 5

## 2024-04-29 MED ORDER — ACETAMINOPHEN 325 MG PO TABS
650.0000 mg | ORAL_TABLET | Freq: Four times a day (QID) | ORAL | Status: DC | PRN
  Administered 2024-05-03 – 2024-05-04 (×2): 650 mg via ORAL
  Filled 2024-04-29 (×3): qty 2

## 2024-04-29 MED ORDER — APIXABAN 5 MG PO TABS
5.0000 mg | ORAL_TABLET | Freq: Two times a day (BID) | ORAL | Status: DC
  Administered 2024-04-29 – 2024-05-06 (×13): 5 mg via ORAL
  Filled 2024-04-29 (×14): qty 1

## 2024-04-29 MED ORDER — MEMANTINE HCL 10 MG PO TABS
10.0000 mg | ORAL_TABLET | Freq: Two times a day (BID) | ORAL | Status: DC
  Administered 2024-04-29 – 2024-05-04 (×10): 10 mg via ORAL
  Filled 2024-04-29 (×11): qty 1

## 2024-04-29 MED ORDER — ATORVASTATIN CALCIUM 10 MG PO TABS
20.0000 mg | ORAL_TABLET | Freq: Every day | ORAL | Status: DC
  Administered 2024-04-30 – 2024-05-03 (×4): 20 mg via ORAL
  Filled 2024-04-29 (×4): qty 2

## 2024-04-29 MED ORDER — METOPROLOL TARTRATE 25 MG PO TABS
50.0000 mg | ORAL_TABLET | Freq: Once | ORAL | Status: AC
  Administered 2024-04-29: 50 mg via ORAL
  Filled 2024-04-29: qty 2

## 2024-04-29 MED ORDER — ACETAMINOPHEN 650 MG RE SUPP
650.0000 mg | Freq: Four times a day (QID) | RECTAL | Status: DC | PRN
  Administered 2024-05-04: 650 mg via RECTAL
  Filled 2024-04-29 (×2): qty 1

## 2024-04-29 NOTE — ED Triage Notes (Addendum)
 Pt sent from MD office for further evaluation of afib and hypotension; pt's wife states he began feeling poorly yesterday, c/o lightheadedness; found to be in Afib at doctors office today, rate 125; also states pt at home was 80s/60s; bp 122/72 in office; endorses fall yesterday morning with positive LOC and head strike; pt on eliquis ; pt denies pain, dizziness , sob currently

## 2024-04-29 NOTE — H&P (Signed)
 History and Physical    Bryan Hart FMW:[id number] DOB: [birth year] DOA: 04/29/2024  Patient coming from: Home.  Chief Complaint: Elevated heart rate.  HPI: Bryan Hart is a 73 y.o. male with history of paroxysmal atrial fibrillation, hypertension, hyperlipidemia, dementia, sleep apnea was brought to the ER after patient was found to be in A-fib with RVR at patient's primary care office.  Per patient and his wife patient was not feeling well yesterday when patient had a fall at home after feeling weak.  At the time patient blood pressure was in the 80s as per the patient's wife.  Today patient had follow-up with his primary care physician when patient was found to be in A-fib with RVR and was referred to the ER.  Patient denies any chest pain shortness of breath fever or chills.  Has been compliant with his medication including Eliquis .  ED Course: In the ER patient was in A-fib with RVR.  Was given IV metoprolol  5 mg followed by p.o. 50 mg metoprolol .  Patient converted to normal sinus rhythm.  In the ER patient blood pressure was normal.  With systolic more than 100.  Troponins were negative labs are largely unremarkable.  Given the recent fall CT head was done which was negative for anything acute.   Review of Systems: As per HPI, rest all negative.   Past Medical History:  Diagnosis Date   Arthritis    HLD (hyperlipidemia)    Hypertension    dr  ed green      stress test   12 yrs ago   PVC's (premature ventricular contractions) 12 years ago   stress test done   Sleep apnea    Stroke [facility])    minor stroke 01/2018    Past Surgical History:  Procedure Laterality Date   ANKLE ARTHROSCOPY  06/27/2008   JOINT REPLACEMENT  06/27/2009   rt shoulder rotator cuff repair   knee surgeries  8030,8006   x2   SHOULDER HEMI-ARTHROPLASTY  01/12/2012   Procedure: SHOULDER HEMI-ARTHROPLASTY;  Surgeon: Eva Elsie Herring, MD;  Location: [facility] OR;  Service: Orthopedics;  Laterality: Right;    SHOULDER SURGERY Right 07/2021   TOTAL KNEE ARTHROPLASTY Right 05/31/2022   Procedure: RIGHT TOTAL KNEE ARTHROPLASTY;  Surgeon: [name] Lonni GRADE, MD;  Location: [facility] OR;  Service: Orthopedics;  Laterality: Right;     reports that he quit smoking about 50 years ago. His smoking use included cigarettes. He started smoking about 54 years ago. He has a 2 pack-year smoking history. He has never used smokeless tobacco. He reports current alcohol  use of about 1.0 - 2.0 standard drink of alcohol  per week. He reports that he does not use drugs.  Allergies  Allergen Reactions   Adhesive [Tape] Rash    Family History  Problem Relation Age of Onset   Heart disease Father    Hypertension Father    Heart attack Brother    Hypertension Brother    Stroke Paternal Grandmother    Heart attack Maternal Uncle    Heart disease Brother    Colon cancer Neg Hx     Prior to Admission medications   Medication Sig Start Date End Date Taking? Authorizing Provider  apixaban  (ELIQUIS ) 5 MG TABS tablet Take 1 tablet (5 mg total) by mouth 2 (two) times daily. 04/26/24   Bryan Hart R, PA  atorvastatin  (LIPITOR) 20 MG tablet Take 20 mg by mouth daily at 6 PM.    [provider]  ciclopirox  (  PENLAC ) 8 % solution Apply topically at bedtime. Apply over nail and surrounding skin. Apply daily over previous coat. After seven (7) days, may remove with alcohol  and continue cycle. 08/22/23   Gershon Donnice SAUNDERS, DPM  memantine  (NAMENDA ) 10 MG tablet Take 1 tablet (10 mg total) by mouth 2 (two) times daily. 12/12/23   Hart, Bryan R, MD  methocarbamol  (ROBAXIN ) 500 MG tablet Take 1 tablet (500 mg total) by mouth every 6 (six) hours as needed for muscle spasms. 06/02/22   Gretta Bertrum ORN, PA-C  metoprolol  tartrate 37.5 MG TABS Take 1 tablet (37.5 mg total) by mouth 2 (two) times daily. 07/07/23   Emelia Josefa HERO, NP    Physical Exam: Constitutional: Moderately built and nourished. Vitals:   04/29/24 2000  04/29/24 2010 04/29/24 2030 04/29/24 2040  BP: (!) 141/95  136/87   Pulse:  96 (!) 106 92  Resp: 15 14 15 16   Temp:      SpO2: 100% 100% 100% 99%   Eyes: Anicteric no pallor. ENMT: No discharge from the ears eyes nose or mouth. Neck: No mass felt.  No neck rigidity. Respiratory: No rhonchi or crepitations. Cardiovascular: S1-S2 heard. Abdomen: Soft nontender bowel sound present. Musculoskeletal: No edema. Skin: No rash. Neurologic: Alert awake oriented time place and person.  Moves all extremities. Psychiatric: Appears normal.  Normal affect.   Labs on Admission: I have personally reviewed following labs and imaging studies  CBC: Recent Labs  Lab 04/29/24 1605  WBC 6.8  HGB 13.6  HCT 42.4  MCV 97.0  PLT 208   Basic Metabolic Panel: Recent Labs  Lab 04/29/24 1605  NA 138  K 4.4  CL 103  CO2 25  GLUCOSE 85  BUN 21  CREATININE 1.15  CALCIUM  8.7*   GFR: CrCl cannot be calculated (Unknown ideal weight.). Liver Function Tests: No results for input(s): AST, ALT, ALKPHOS, BILITOT, PROT, ALBUMIN in the last 168 hours. No results for input(s): LIPASE, AMYLASE in the last 168 hours. No results for input(s): AMMONIA in the last 168 hours. Coagulation Profile: No results for input(s): INR, PROTIME in the last 168 hours. Cardiac Enzymes: No results for input(s): CKTOTAL, CKMB, CKMBINDEX, TROPONINI in the last 168 hours. BNP (last 3 results) No results for input(s): PROBNP in the last 8760 hours. HbA1C: No results for input(s): HGBA1C in the last 72 hours. CBG: No results for input(s): GLUCAP in the last 168 hours. Lipid Profile: No results for input(s): CHOL, HDL, LDLCALC, TRIG, CHOLHDL, LDLDIRECT in the last 72 hours. Thyroid  Function Tests: No results for input(s): TSH, T4TOTAL, FREET4, T3FREE, THYROIDAB in the last 72 hours. Anemia Panel: No results for input(s): VITAMINB12, FOLATE, FERRITIN, TIBC,  IRON, RETICCTPCT in the last 72 hours. Urine analysis:    Component Value Date/Time   COLORURINE YELLOW 01/12/2012 0625   APPEARANCEUR CLEAR 01/12/2012 0625   LABSPEC 1.026 01/12/2012 0625   PHURINE 5.5 01/12/2012 0625   GLUCOSEU NEGATIVE 01/12/2012 0625   HGBUR NEGATIVE 01/12/2012 0625   BILIRUBINUR NEGATIVE 01/12/2012 0625   KETONESUR NEGATIVE 01/12/2012 0625   PROTEINUR NEGATIVE 01/12/2012 0625   UROBILINOGEN 0.2 01/12/2012 0625   NITRITE NEGATIVE 01/12/2012 0625   LEUKOCYTESUR NEGATIVE 01/12/2012 0625   Sepsis Labs: @LABRCNTIP (procalcitonin:4,lacticidven:4) )No results found for this or any previous visit (from the past 240 hours).   Radiological Exams on Admission: DG Chest 2 View Result Date: 04/29/2024 CLINICAL DATA:  Atrial fibrillation, weakness EXAM: CHEST - 2 VIEW COMPARISON:  04/04/2023 FINDINGS: Frontal and lateral  views of the chest demonstrate an unremarkable cardiac silhouette. No acute airspace disease, effusion, or pneumothorax. Stable right shoulder arthroplasty. No acute fractures. IMPRESSION: 1. No acute intrathoracic process. Electronically Signed   By: Ozell Daring M.D.   On: 04/29/2024 17:18   CT Head Wo Contrast Result Date: 04/29/2024 EXAM: CT HEAD WITHOUT CONTRAST 04/29/2024 04:27:49 PM TECHNIQUE: CT of the head was performed without the administration of intravenous contrast. Automated exposure control, iterative reconstruction, and/or weight based adjustment of the mA/kV was utilized to reduce the radiation dose to as low as reasonably achievable. COMPARISON: MRI head 06/04/2024 CLINICAL HISTORY: Head trauma, minor (Age >= 65y) FINDINGS: BRAIN AND VENTRICLES: No acute hemorrhage. No evidence of acute infarct. No hydrocephalus. No extra-axial collection. No mass effect or midline shift.ccl ORBITS: No acute abnormality. SINUSES: No acute abnormality. SOFT TISSUES AND SKULL: No acute soft tissue abnormality. No skull fracture. IMPRESSION: 1. No acute  intracranial abnormality. Electronically signed by: Gilmore Molt MD 04/29/2024 05:08 PM EST RP Workstation: [id number]    EKG: Independently reviewed.  A-fib with RVR.  Assessment/Plan Principal Problem:   Atrial fibrillation [facility]) Active Problems:   Central sleep apnea   Cerebrovascular accident [facility])   Atrial fibrillation with RVR (HCC)    A-fib with RVR converted back to sinus rhythm in the ER after was given IV metoprolol  and p.o. metoprolol .  Will check TSH.  Cardiac markers were negative.  Continue patient's metoprolol  home dose along with Eliquis .  Last 2D echo done in November 2024 was showing EF of 60 to 65%. Fall -   patient states he had a fall yesterday at the time blood pressure was low.  Will get physical therapy consult.  Closely monitor blood pressure trends. Hypertension on metoprolol . Hyperlipidemia on statins. Sleep apnea intolerant to CPAP. Dementia on Namenda .   DVT prophylaxis: Eliquis . Code Status: Full code. Family Communication: Patient's wife. Disposition Plan: Monitored bed. Consults called: Physical therapy. Admission status: Observation.

## 2024-04-29 NOTE — ED Provider Triage Note (Signed)
 Emergency Medicine Provider Triage Evaluation Note  Bryan Hart , a 73 y.o. male  was evaluated in triage.  Pt complains of weakness, afib RVR. Reports he tripped and hit his head on the wall yesterday, no LOC. Is on eliquis .  Denies headache or vision changes.  States that he has been feeling generally weak since yesterday, this morning which was doctor was told he was in A-fib RVR.  Blood pressures at home were hypotensive in the 80s, however normal here and at his doctors office. Currently patient states he feels well, denies chest pain or shortness of breath.   Review of Systems  Positive: Negative:   Physical Exam  BP (!) 123/101   Pulse 94   Temp 97.7 F (36.5 C)   Resp 14   SpO2 97%  Gen:   Awake, no distress   Resp:  Normal effort  MSK:   Moves extremities without difficulty  Other:    Medical Decision Making  Medically screening exam initiated at 3:58 PM.  Appropriate orders placed.  Bryan Hart was informed that the remainder of the evaluation will be completed by another provider, this initial triage assessment does not replace that evaluation, and the importance of remaining in the ED until their evaluation is complete.     Nora Lauraine LABOR, PA-C 04/29/24 1601

## 2024-04-29 NOTE — ED Provider Notes (Signed)
 [place] EMERGENCY DEPARTMENT AT [facility] Provider Note  MDM   HPI/ROS:  Bryan Hart is a 73 y.o. male with a PMH of PVCs, HTN, CVA, OSA, atrial fibrillation on metoprolol  and Eliquis , dementia who presented to the ED for fall.  Patient was sent from his PCP office for further evaluation of atrial fibrillation and hypotension.  Yesterday patient reports that he was feeling poorly, lightheaded and dizzy all day, and had a fall where he went face first into a cabinet with his head and lost consciousness.  At that time his wife took his blood pressure and it was repeatedly 80s over 60s.  In the office today he was normotensive but in A-fib with RVR.  He denies any chest pain, shortness of breath, does endorse feeling dizzy and lightheaded.  Wife reports that he is at his mental baseline.  On my initial evaluation, patient is:  -Vital signs significant for normotension, A-fib and RVR with a rate of 124, respirations are nonlabored, saturating 97% on RA. Patient afebrile, hemodynamically stable, and non-toxic appearing.  Physical exam is notable for: - Lungs are CTAB no crackles -- 1+ pitting edema in the bilateral lower extremities, venous stasis of the lower extremity -- Patient has a bilateral intention tremor and baseline, worsens with movements.  No pronator drift, neurologic exam is nonfocal with 5/5 strength in the bilateral upper and lower extremities.  Patient does have some difficulty with finger-to-nose testing secondary to tremor as well as some dysmetria  Patient's BP significant for HTN and last echo with preserved EF. Given that he is on metoprolol  at home, will attempt 5mg  IV for rate control. Patient converted to sinus rythmn after 2 doses IV followed by 50 mg po metop. Systolics persistently >100, no evidence of hypotension. Troponins flat and negative x2, CBC and CMP without medically relevant abnormalities.  CT head obtained given recent fall with LOC and on thinners  but negative for acute abnormality.    Patient thought to require admission given persistent afib RVR requiring IV medication control and echo given >6 months since prior.  Interpretations, interventions, and the patient's course of care are documented below.    Disposition:  I discussed the case with Hospitalist who graciously agreed to admit the patient to their service for continued care.   Clinical Impression:  1. Paroxysmal atrial fibrillation (HCC)   2. Atrial fibrillation with RVR (HCC)   3. Fall at home, initial encounter     Clinical Complexity A medically appropriate history, review of systems, and physical exam was performed.  My independent interpretations of EKG, labs, and radiology are documented in the ED course above.   If decision rules were used in this patient's evaluation, they are listed below.   Click here for ABCD2, HEART and other calculatorsREFRESH Note before signing   Patient's presentation is most consistent with acute complicated illness / injury requiring diagnostic workup.  Medical Decision Making Risk Prescription drug management. Decision regarding hospitalization.    HPI/ROS      See MDM section for pertinent HPI and ROS. A complete ROS was performed with pertinent positives/negatives noted above.   Past Medical History:  Diagnosis Date   Arthritis    HLD (hyperlipidemia)    Hypertension    dr  ed green      stress test   12 yrs ago   PVC's (premature ventricular contractions) 12 years ago   stress test done   Sleep apnea    Stroke [facility])  minor stroke 01/2018    Past Surgical History:  Procedure Laterality Date   ANKLE ARTHROSCOPY  06/27/2008   JOINT REPLACEMENT  06/27/2009   rt shoulder rotator cuff repair   knee surgeries  8030,8006   x2   SHOULDER HEMI-ARTHROPLASTY  01/12/2012   Procedure: SHOULDER HEMI-ARTHROPLASTY;  Surgeon: Eva Elsie Herring, MD;  Location: [facility] OR;  Service: Orthopedics;  Laterality: Right;    SHOULDER SURGERY Right 07/2021   TOTAL KNEE ARTHROPLASTY Right 05/31/2022   Procedure: RIGHT TOTAL KNEE ARTHROPLASTY;  Surgeon: [name] Lonni GRADE, MD;  Location: [facility] OR;  Service: Orthopedics;  Laterality: Right;      Physical Exam   Vitals:   04/29/24 1526  BP: (!) 123/101  Pulse: 94  Resp: 14  Temp: 97.7 F (36.5 C)  SpO2: 97%    Physical Exam: see above   Procedures   If procedures were preformed on this patient, they are listed below:  Procedures   Please note that this documentation was produced with the assistance of voice-to-text technology and may contain errors.     Billy Pal, MD 05/01/24 0929    Pamella Ozell LABOR, DO 05/07/24 [phone]

## 2024-04-30 DIAGNOSIS — I4891 Unspecified atrial fibrillation: Secondary | ICD-10-CM | POA: Diagnosis not present

## 2024-04-30 DIAGNOSIS — I480 Paroxysmal atrial fibrillation: Secondary | ICD-10-CM | POA: Diagnosis not present

## 2024-04-30 DIAGNOSIS — W19XXXA Unspecified fall, initial encounter: Secondary | ICD-10-CM

## 2024-04-30 LAB — BASIC METABOLIC PANEL WITH GFR
Anion gap: 7 (ref 5–15)
BUN: 15 mg/dL (ref 8–23)
CO2: 27 mmol/L (ref 22–32)
Calcium: 8.7 mg/dL — ABNORMAL LOW (ref 8.9–10.3)
Chloride: 107 mmol/L (ref 98–111)
Creatinine, Ser: 1.04 mg/dL (ref 0.61–1.24)
GFR, Estimated: >60 mL/min (ref >60)
Glucose, Bld: 98 mg/dL (ref 70–99)
Potassium: 4 mmol/L (ref 3.5–5.1)
Sodium: 141 mmol/L (ref 135–145)

## 2024-04-30 LAB — CBC
HCT: 40 % (ref 39.0–52.0)
Hemoglobin: 13 g/dL (ref 13.0–17.0)
MCH: 31.2 pg (ref 26.0–34.0)
MCHC: 32.5 g/dL (ref 30.0–36.0)
MCV: 95.9 fL (ref 80.0–100.0)
Platelets: 173 K/uL (ref 150–400)
RBC: 4.17 MIL/uL — ABNORMAL LOW (ref 4.22–5.81)
RDW: 13 % (ref 11.5–15.5)
WBC: 4.9 K/uL (ref 4.0–10.5)
nRBC: 0 % (ref 0.0–0.2)

## 2024-04-30 LAB — TSH: TSH: 2.389 u[IU]/mL (ref 0.350–4.500)

## 2024-04-30 LAB — MRSA NEXT GEN BY PCR, NASAL: MRSA by PCR Next Gen: NOT DETECTED

## 2024-04-30 LAB — MAGNESIUM: Magnesium: 2 mg/dL (ref 1.7–2.4)

## 2024-04-30 MED ORDER — MELATONIN 5 MG PO TABS
5.0000 mg | ORAL_TABLET | Freq: Every evening | ORAL | Status: DC | PRN
  Administered 2024-05-03: 5 mg via ORAL
  Filled 2024-04-30: qty 1

## 2024-04-30 MED ORDER — LACTATED RINGERS IV BOLUS
500.0000 mL | Freq: Once | INTRAVENOUS | Status: AC
  Administered 2024-04-30: 1000 mL via INTRAVENOUS

## 2024-04-30 MED ORDER — FLECAINIDE ACETATE 100 MG PO TABS
100.0000 mg | ORAL_TABLET | Freq: Two times a day (BID) | ORAL | Status: DC
  Administered 2024-04-30 – 2024-05-06 (×12): 100 mg via ORAL
  Filled 2024-04-30 (×13): qty 1
  Filled 2024-04-30: qty 2

## 2024-04-30 MED ORDER — DILTIAZEM LOAD VIA INFUSION
20.0000 mg | Freq: Once | INTRAVENOUS | Status: DC
  Filled 2024-04-30: qty 20

## 2024-04-30 MED ORDER — LACTATED RINGERS IV SOLN: INTRAVENOUS | Status: DC

## 2024-04-30 MED ORDER — DILTIAZEM HCL-DEXTROSE 125-5 MG/125ML-% IV SOLN (PREMIX)
5.0000 mg/h | INTRAVENOUS | Status: DC
  Administered 2024-04-30: 5 mg/h via INTRAVENOUS
  Filled 2024-04-30: qty 125

## 2024-04-30 MED ORDER — QUETIAPINE FUMARATE 50 MG PO TABS
25.0000 mg | ORAL_TABLET | Freq: Once | ORAL | Status: AC
  Administered 2024-04-30: 25 mg via ORAL
  Filled 2024-04-30: qty 1

## 2024-04-30 MED ORDER — METOPROLOL TARTRATE 5 MG/5ML IV SOLN
2.5000 mg | Freq: Four times a day (QID) | INTRAVENOUS | Status: DC | PRN
  Administered 2024-04-30 – 2024-05-02 (×2): 2.5 mg via INTRAVENOUS
  Filled 2024-04-30 (×2): qty 5

## 2024-04-30 NOTE — ED Notes (Signed)
 PT at bedside.

## 2024-04-30 NOTE — Progress Notes (Signed)
 [facility] night cross cover note:   I was notified by the patient's RN that this patient's blood pressure is running softer, with most recent blood pressure noted to be 83/51, relative to systolic blood pressures in the low 100s earlier this evening.  Patient with history of dementia, without any reported acute complaints at this time.   He is here with atrial fibrillation with RVR, currently on oral metoprolol  to tartrate as well as flecainide.  Most recent heart rates while remaining in atrial fibrillation noted to be in the 90s to low 100s, significantly improved relative to his presenting A-fib with RVR and initial heart rates in the 140s to 160s bpm.  Most recent vital signs also notable for afebrile; respiratory rate 18-20, and oxygen saturation 98% on room air.   Per chart review, no documented history of CHF, with most recent echocardiogram in November 2024 showing LVEF 60 to 65%, normal diastolic parameters, normal right ventricular systolic function and no evidence of significant valvular pathology.  Additionally, presenting chest x-ray showed no evidence of acute cardiopulmonary process, including no evidence of infiltrate, edema, or effusion.   In the absence of evidence of contributory or resultant acutely decompensated heart failure, I initially ordered a 500 cc LR bolus for the patient. However, prior to administration of this bolus, repeat blood pressure independently improved, with systolic blood pressures now into the low 100s mmHg. In light of this, will d/c the order for 500 cc LR bolus and instead proceed with LR 75 cc/hr x 10 hours, with plan to closely monitor ensuing vital sign trend and volume status.     Eva Pore, DO Hospitalist

## 2024-04-30 NOTE — Consult Note (Signed)
 CARDIOLOGY CONSULT NOTE       Patient ID: Bryan Hart MRN: [id number] DOB/AGE: 01/13/1951 73 y.o.  Admit date: 04/29/2024 Referring Physician: Franky Primary Physician: [name], DO Primary Cardiologist: [facility]  Reason for Consultation: afib  Principal Problem:   Atrial fibrillation [facility]) Active Problems:   Central sleep apnea   Cerebrovascular accident 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 Seen by primary for dizziness/fatigue and sent to ER for rapid afib. History of PAF 03/2023. Converted with cardizem  at that time CHADVASC 4 on eliquis . He/wife indicate has missed a couple doses the last 2 weeks. Converted in ER with iv lopressor  but on my interview was back in afib rates 100-110 bpm. He has no history of CAD, chest pain or syncope. Does not appear to always know when he is in afib. He is on a relatively high dose of lopressor  37.5 mg bid. He is not on an AAT. TTE done 05/12/23 with normal EF 60-65% no LVH no significant valve dx and LA 36 mm .  He has sleep apnea but intolerant to CPAP. He has significant cognitive decline last year. Namenda  not really helping now.   ROS All other systems reviewed and negative except as noted above  Past Medical History:  Diagnosis Date   Arthritis    HLD (hyperlipidemia)    Hypertension    dr  ed green      stress test   12 yrs ago   PVC's (premature ventricular contractions) 12 years ago   stress test done   Sleep apnea    Stroke [facility])    minor stroke 01/2018    Family History  Problem Relation Age of Onset   Heart disease Father    Hypertension Father    Heart attack Brother    Hypertension Brother    Stroke Paternal Grandmother    Heart attack Maternal Uncle    Heart disease Brother    Colon cancer Neg Hx     Social History   Socioeconomic History   Marital  status: Married    Spouse name: [name]   Number of children: Not on file   Years of education: Not on file   Highest education level: Master's degree (e.g., MA, MS, MEng, MEd, MSW, MBA)  Occupational History   Not on file  Tobacco Use   Smoking status: Former    Current packs/day: 0.00    Average packs/day: 0.5 packs/day for 4.0 years (2.0 ttl pk-yrs)    Types: Cigarettes    Start date: 68    Quit date: 15    Years since quitting: 50.8   Smokeless tobacco: Never   Tobacco comments:    Former smoker 04/12/23  Vaping Use   Vaping status: Never Used  Substance and Sexual Activity   Alcohol  use: Yes    Alcohol /week: 1.0 - 2.0 standard drink of alcohol     Types: 1 - 2 Cans of beer per week    Comment: daily   Drug use: No   Sexual activity: Not on file  Other Topics Concern   Not on file  Social History Narrative   Lives home with spouse [name].   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   Left handed    Social Drivers of Corporate Investment Banker Strain: Not on file  Food  Insecurity: Not on file  Transportation Needs: Not on file  Physical Activity: Not on file  Stress: Not on file  Social Connections: Not on file  Intimate Partner Violence: Not on file    Past Surgical History:  Procedure Laterality Date   ANKLE ARTHROSCOPY  06/27/2008   JOINT REPLACEMENT  06/27/2009   rt shoulder rotator cuff repair   knee surgeries  8030,8006   x2   SHOULDER HEMI-ARTHROPLASTY  01/12/2012   Procedure: SHOULDER HEMI-ARTHROPLASTY;  Surgeon: Eva Elsie Herring, MD;  Location: [facility] OR;  Service: Orthopedics;  Laterality: Right;   SHOULDER SURGERY Right 07/2021   TOTAL KNEE ARTHROPLASTY Right 05/31/2022   Procedure: RIGHT TOTAL KNEE ARTHROPLASTY;  Surgeon: [name] Lonni GRADE, MD;  Location: [facility] OR;  Service: Orthopedics;  Laterality: Right;      Current Facility-Administered Medications:    acetaminophen  (TYLENOL ) tablet 650 mg, 650 mg, Oral, Q6H PRN **OR** acetaminophen   (TYLENOL ) suppository 650 mg, 650 mg, Rectal, Q6H PRN, Franky Redia SAILOR, MD   apixaban  (ELIQUIS ) tablet 5 mg, 5 mg, Oral, BID, Kakrakandy, Arshad N, MD, 5 mg at 04/30/24 1029   atorvastatin  (LIPITOR) tablet 20 mg, 20 mg, Oral, q1800, Franky Redia SAILOR, MD   diltiazem  (CARDIZEM ) 125 mg in dextrose  5% 125 mL (1 mg/mL) infusion, 5-15 mg/hr, Intravenous, Continuous, Raenelle Coria, MD, Stopped at 04/30/24 0810   memantine  (NAMENDA ) tablet 10 mg, 10 mg, Oral, BID, Kakrakandy, Arshad N, MD, 10 mg at 04/30/24 1031   metoprolol  tartrate (LOPRESSOR ) tablet 37.5 mg, 37.5 mg, Oral, BID, Franky Redia SAILOR, MD, 37.5 mg at 04/30/24 1030  Current Outpatient Medications:    apixaban  (ELIQUIS ) 5 MG TABS tablet, Take 1 tablet (5 mg total) by mouth 2 (two) times daily., Disp: 60 tablet, Rfl: 5   atorvastatin  (LIPITOR) 20 MG tablet, Take 20 mg by mouth daily., Disp: , Rfl:    memantine  (NAMENDA ) 10 MG tablet, Take 1 tablet (10 mg total) by mouth 2 (two) times daily., Disp: 180 tablet, Rfl: 4   methocarbamol  (ROBAXIN ) 500 MG tablet, Take 1 tablet (500 mg total) by mouth every 6 (six) hours as needed for muscle spasms., Disp: 40 tablet, Rfl: 1   metoprolol  tartrate 37.5 MG TABS, Take 1 tablet (37.5 mg total) by mouth 2 (two) times daily., Disp: 180 tablet, Rfl: 3  apixaban   5 mg Oral BID   atorvastatin   20 mg Oral q1800   memantine   10 mg Oral BID   metoprolol  tartrate  37.5 mg Oral BID    diltiazem  (CARDIZEM ) infusion Stopped (04/30/24 0810)    Physical Exam: Blood pressure 93/79, pulse 81, temperature 97.9 F (36.6 C), temperature source Oral, resp. rate 13, SpO2 100%.    Affect appropriate  Short term memory poor  Healthy:  appears stated age HEENT: normal Neck supple with no adenopathy JVP normal no bruits no thyromegaly Lungs clear with no wheezing and good diaphragmatic motion Heart:  S1/S2 no murmur, no rub, gallop or click PMI normal Abdomen: benighn, BS positve, no tenderness, no  AAA no bruit.  No HSM or HJR Distal pulses intact with no bruits No edema Neuro non-focal Skin warm and dry No muscular weakness   Labs:   Lab Results  Component Value Date   WBC 4.9 04/30/2024   HGB 13.0 04/30/2024   HCT 40.0 04/30/2024   MCV 95.9 04/30/2024   PLT 173 04/30/2024    Recent Labs  Lab 04/30/24 0327  NA 141  K 4.0  CL 107  CO2  27  BUN 15  CREATININE 1.04  CALCIUM  8.7*  GLUCOSE 98   No results found for: CKTOTAL, CKMB, CKMBINDEX, TROPONINI No results found for: CHOL No results found for: HDL No results found for: LDLCALC No results found for: TRIG No results found for: CHOLHDL No results found for: LDLDIRECT    Radiology: DG Chest 2 View Result Date: 04/29/2024 CLINICAL DATA:  Atrial fibrillation, weakness EXAM: CHEST - 2 VIEW COMPARISON:  04/04/2023 FINDINGS: Frontal and lateral views of the chest demonstrate an unremarkable cardiac silhouette. No acute airspace disease, effusion, or pneumothorax. Stable right shoulder arthroplasty. No acute fractures. IMPRESSION: 1. No acute intrathoracic process. Electronically Signed   By: Ozell Daring M.D.   On: 04/29/2024 17:18   CT Head Wo Contrast Result Date: 04/29/2024 EXAM: CT HEAD WITHOUT CONTRAST 04/29/2024 04:27:49 PM TECHNIQUE: CT of the head was performed without the administration of intravenous contrast. Automated exposure control, iterative reconstruction, and/or weight based adjustment of the mA/kV was utilized to reduce the radiation dose to as low as reasonably achievable. COMPARISON: MRI head 06/04/2024 CLINICAL HISTORY: Head trauma, minor (Age >= 65y) FINDINGS: BRAIN AND VENTRICLES: No acute hemorrhage. No evidence of acute infarct. No hydrocephalus. No extra-axial collection. No mass effect or midline shift.ccl ORBITS: No acute abnormality. SINUSES: No acute abnormality. SOFT TISSUES AND SKULL: No acute soft tissue abnormality. No skull fracture. IMPRESSION: 1. No acute  intracranial abnormality. Electronically signed by: Gilmore Molt MD 04/29/2024 05:08 PM EST RP Workstation: [id number]    EKG: afib rate 103 normal QT and ST segments    ASSESSMENT AND PLAN:   PAF:  second documented episode. Missed some doses of his eliquis . I think it would be safe since he already has converted once in ER with iv lopressor  to start flecainide 100 mg bid. He has normal EF, QT, no CAD and no severe LVH.  Continue his eliquis  5 mg bid. Continue his lopresser If he does not convert can consider TEE/DCC on Thursday Neuro:  had fall CT head no acute findings continue Namenda  or consider changing to aricept Can f/u with neurologist as outpatient    Signed: Maude Emmer 04/30/2024, 12:53 PM

## 2024-04-30 NOTE — ED Notes (Signed)
 Pt BP trending down, called Admitting MD x 2 with no response, paged provider and sent secure chat.

## 2024-04-30 NOTE — Care Management Obs Status (Signed)
 MEDICARE OBSERVATION STATUS NOTIFICATION   Patient Details  Name: GRAYLEN NOBOA MRN: [id number] Date of Birth: May 13, 1951   Medicare Observation Status Notification Given:  Yes    Nena LITTIE Coffee, RN 04/30/2024, 1:46 PM

## 2024-04-30 NOTE — Plan of Care (Signed)

## 2024-04-30 NOTE — Progress Notes (Signed)
 PROGRESS NOTE    Bryan Hart  FMW:[id number] DOB: February 14, 1951 DOA: 04/29/2024 PCP: [name], DO    Brief Narrative:  73 year old with history of A-fib, hypertension, hyperlipidemia, sleep apnea, essential tremors was not feeling well for last few days, went to PCP office and found to be in A-fib with RVR with heart rate of more than 150 so sent to the emergency room.  Patient describes sometimes feeling dizziness.  At the emergency room blood pressures were stable.  Patient was not A-fib with RVR with heart rate 155.  IV metoprolol  5 mg was given, p.o. metoprolol  50 mg was given initially converted to sinus rhythm.  Troponins were negative.  CT head was normal. Patient back to rapid A-fib overnight.  Subjective: Patient seen and examined.  He was eating breakfast.  Denies any chest pain or palpitations.  He was with rapid A-fib.  Patient was given 5 mg IV metoprolol  push as ordered, plan to start on Cardizem  infusion.  Blood pressure dropped to 80/50.  Cardizem  infusion is stopped.  On reevaluation, his blood pressure improved after laying supine.  Heart rate 105-120.   Assessment & Plan:   A-fib with RVR, suboptimally rate controlled: Remains persistent A-fib, suboptimal rate control.  Currently hemodynamically stable.  On metoprolol  as needed. At home on metoprolol  37.5 twice daily, continued. Therapeutic on Eliquis , compliant and continued. TSH normal. Last echocardiogram with normal EF. Patient will need better rate control, will consult cardiology.  Fall and deconditioning: Consult PT OT.  Hypertension: Avoiding controlling blood pressure to make room for rate control medications.  Hyperlipidemia, on statins.  Sleep apnea, untreated.  Intolerance to CPAP.  Dementia: Pretty independent.  On Namenda .    DVT prophylaxis:  apixaban  (ELIQUIS ) tablet 5 mg   Code Status: Full code Family Communication: None at the bedside Disposition Plan: Status is: Observation The  patient will require care spanning > 2 midnights and should be moved to inpatient because: Suboptimal rate control     Consultants:  Cardiology  Procedures:  None   Antimicrobials:  none       Objective: Vitals:   04/30/24 0817 04/30/24 0821 04/30/24 0822 04/30/24 0830  BP: 107/80 (!) 141/113  (!) 118/93  Pulse: (!) 50 92 (!) 105 (!) 107  Resp: 19 16 10 14   Temp:      TempSrc:      SpO2: 100% 100% 100% 100%   No intake or output data in the 24 hours ending 04/30/24 0905 There were no vitals filed for this visit.  Examination:  General exam: Appears calm and comfortable.  Pleasant and interactive. Respiratory system: Clear to auscultation. Respiratory effort normal.  No added sounds. Cardiovascular system: S1 & S2 heard, irregularly irregular.  Tachycardic.SABRA No JVD, murmurs, rubs, gallops or clicks. No pedal edema. Gastrointestinal system: Abdomen is nondistended, soft and nontender. No organomegaly or masses felt. Normal bowel sounds heard. Central nervous system: Alert and oriented. No focal neurological deficits. Extremities: Symmetric 5 x 5 power.      Data Reviewed: I have personally reviewed following labs and imaging studies  CBC: Recent Labs  Lab 04/29/24 1605 04/30/24 0327  WBC 6.8 4.9  HGB 13.6 13.0  HCT 42.4 40.0  MCV 97.0 95.9  PLT 208 173   Basic Metabolic Panel: Recent Labs  Lab 04/29/24 1605 04/30/24 0327  NA 138 141  K 4.4 4.0  CL 103 107  CO2 25 27  GLUCOSE 85 98  BUN 21 15  CREATININE 1.15 1.04  CALCIUM  8.7* 8.7*  MG  --  2.0   GFR: CrCl cannot be calculated (Unknown ideal weight.). Liver Function Tests: No results for input(s): AST, ALT, ALKPHOS, BILITOT, PROT, ALBUMIN in the last 168 hours. No results for input(s): LIPASE, AMYLASE in the last 168 hours. No results for input(s): AMMONIA in the last 168 hours. Coagulation Profile: No results for input(s): INR, PROTIME in the last 168 hours. Cardiac  Enzymes: No results for input(s): CKTOTAL, CKMB, CKMBINDEX, TROPONINI in the last 168 hours. BNP (last 3 results) No results for input(s): PROBNP in the last 8760 hours. HbA1C: No results for input(s): HGBA1C in the last 72 hours. CBG: No results for input(s): GLUCAP in the last 168 hours. Lipid Profile: No results for input(s): CHOL, HDL, LDLCALC, TRIG, CHOLHDL, LDLDIRECT in the last 72 hours. Thyroid  Function Tests: Recent Labs    04/30/24 0327  TSH 2.389   Anemia Panel: No results for input(s): VITAMINB12, FOLATE, FERRITIN, TIBC, IRON, RETICCTPCT in the last 72 hours. Sepsis Labs: No results for input(s): PROCALCITON, LATICACIDVEN in the last 168 hours.  No results found for this or any previous visit (from the past 240 hours).       Radiology Studies: DG Chest 2 View Result Date: 04/29/2024 CLINICAL DATA:  Atrial fibrillation, weakness EXAM: CHEST - 2 VIEW COMPARISON:  04/04/2023 FINDINGS: Frontal and lateral views of the chest demonstrate an unremarkable cardiac silhouette. No acute airspace disease, effusion, or pneumothorax. Stable right shoulder arthroplasty. No acute fractures. IMPRESSION: 1. No acute intrathoracic process. Electronically Signed   By: Ozell Daring M.D.   On: 04/29/2024 17:18   CT Head Wo Contrast Result Date: 04/29/2024 EXAM: CT HEAD WITHOUT CONTRAST 04/29/2024 04:27:49 PM TECHNIQUE: CT of the head was performed without the administration of intravenous contrast. Automated exposure control, iterative reconstruction, and/or weight based adjustment of the mA/kV was utilized to reduce the radiation dose to as low as reasonably achievable. COMPARISON: MRI head 06/04/2024 CLINICAL HISTORY: Head trauma, minor (Age >= 65y) FINDINGS: BRAIN AND VENTRICLES: No acute hemorrhage. No evidence of acute infarct. No hydrocephalus. No extra-axial collection. No mass effect or midline shift.ccl ORBITS: No acute abnormality. SINUSES:  No acute abnormality. SOFT TISSUES AND SKULL: No acute soft tissue abnormality. No skull fracture. IMPRESSION: 1. No acute intracranial abnormality. Electronically signed by: Gilmore Molt MD 04/29/2024 05:08 PM EST RP Workstation: [id number]        Scheduled Meds:  apixaban   5 mg Oral BID   atorvastatin   20 mg Oral q1800   memantine   10 mg Oral BID   metoprolol  tartrate  37.5 mg Oral BID   Continuous Infusions:  diltiazem  (CARDIZEM ) infusion Stopped (04/30/24 0810)     LOS: 0 days    Time spent: 51 minutes    Renato Applebaum, MD [facility]

## 2024-04-30 NOTE — Evaluation (Signed)
 Physical Therapy Evaluation Patient Details Name: Bryan Hart MRN: [id number] DOB: 04-Jul-1950 Today's Date: 04/30/2024  History of Present Illness  73 yo M presented to ED 04/29/24 in Afib with hypotension and fall. PMhx: CVA, HLD, HTN, AFib, AV block, Rt TKA, dementia, Rt shoulder hemiarthroplasty  Clinical Impression  Pt pleasant with flat affect and wife present. Pt reports no other falls and normally walks without AD with wife available for transportation and IADLs. Pt able to demonstrate slow shuffling gait and would benefit from acute therapy to maximize mobility as well as OPPT. Pt educated for walking program and repeated sit to stands for strengthening in preparation for pending TKA.      HR 87-101 SPO2 100% RA 125/99 (107)    If plan is discharge home, recommend the following: Assist for transportation;Supervision due to cognitive status;Assistance with cooking/housework   Can travel by private vehicle        Equipment Recommendations None recommended by PT  Recommendations for Other Services       Functional Status Assessment Patient has had a recent decline in their functional status and/or demonstrates limited ability to make significant improvements in function in a reasonable and predictable amount of time     Precautions / Restrictions Precautions Precautions: Fall Recall of Precautions/Restrictions: Impaired      Mobility  Bed Mobility Overal bed mobility: Modified Independent                  Transfers Overall transfer level: Needs assistance   Transfers: Sit to/from Stand Sit to Stand: Supervision           General transfer comment: supervision for safety    Ambulation/Gait Ambulation/Gait assistance: Supervision Gait Distance (Feet): 200 Feet Assistive device: None Gait Pattern/deviations: Shuffle, Step-through pattern, Decreased stride length   Gait velocity interpretation: <1.31 ft/sec, indicative of household ambulator    General Gait Details: pt with very short step length with limited ability to increased stride with stepping. Pt reports decreased step length to control left knee pain, but baseline. slow gait  Stairs            Wheelchair Mobility     Tilt Bed    Modified Rankin (Stroke Patients Only)       Balance Overall balance assessment: History of Falls                                           Pertinent Vitals/Pain Pain Assessment Pain Assessment: No/denies pain    Home Living Family/patient expects to be discharged to:: Private residence Living Arrangements: Spouse/significant other Available Help at Discharge: Available 24 hours/day Type of Home: House Home Access: Level entry     Alternate Level Stairs-Number of Steps: 13 Home Layout: Two level;Bed/bath upstairs Home Equipment: Agricultural Consultant (2 wheels);BSC/3in1      Prior Function Prior Level of Function : Independent/Modified Independent             Mobility Comments: not driving, walks without AD ADLs Comments: independent     Extremity/Trunk Assessment   Upper Extremity Assessment Upper Extremity Assessment: Generalized weakness    Lower Extremity Assessment Lower Extremity Assessment: Generalized weakness    Cervical / Trunk Assessment Cervical / Trunk Assessment: Normal  Communication   Communication Communication: No apparent difficulties    Cognition Arousal: Alert Behavior During Therapy: Flat affect   PT - Cognitive impairments: Memory,  Safety/Judgement                         Following commands: Intact       Cueing Cueing Techniques: Verbal cues     General Comments      Exercises     Assessment/Plan    PT Assessment Patient needs continued PT services  PT Problem List Decreased mobility;Decreased safety awareness;Decreased activity tolerance;Decreased strength       PT Treatment Interventions DME instruction;Gait training;Stair  training;Functional mobility training;Therapeutic activities;Patient/family education    PT Goals (Current goals can be found in the Care Plan section)  Acute Rehab PT Goals Patient Stated Goal: return home, get my knee sx PT Goal Formulation: With patient/family Time For Goal Achievement: 05/14/24 Potential to Achieve Goals: Good    Frequency Min 1X/week     Co-evaluation               AM-PAC PT 6 Clicks Mobility  Outcome Measure Help needed turning from your back to your side while in a flat bed without using bedrails?: None Help needed moving from lying on your back to sitting on the side of a flat bed without using bedrails?: None Help needed moving to and from a bed to a chair (including a wheelchair)?: A Little Help needed standing up from a chair using your arms (e.g., wheelchair or bedside chair)?: A Little Help needed to walk in hospital room?: A Little Help needed climbing 3-5 steps with a railing? : A Little 6 Click Score: 20    End of Session Equipment Utilized During Treatment: Gait belt Activity Tolerance: Patient tolerated treatment well Patient left: in chair;with call bell/phone within reach;with family/visitor present Nurse Communication: Mobility status PT Visit Diagnosis: Other abnormalities of gait and mobility (R26.89);History of falling (Z91.81);Difficulty in walking, not elsewhere classified (R26.2)    Time: 0937-1000 PT Time Calculation (min) (ACUTE ONLY): 23 min   Charges:   PT Evaluation $PT Eval Low Complexity: 1 Low PT Treatments $Gait Training: 8-22 mins PT General Charges $$ ACUTE PT VISIT: 1 Visit         [name], PT Acute Rehabilitation Services Office: [phone]   [name] 04/30/2024, 10:34 AM

## 2024-04-30 NOTE — ED Notes (Signed)
 Admitting Ghimire, MD at bedside

## 2024-04-30 NOTE — ED Notes (Signed)
 CCMD called and pt added to monitor

## 2024-04-30 NOTE — Plan of Care (Signed)
  Problem: Education: Goal: Knowledge of General Education information will improve Description: Including pain rating scale, medication(s)/side effects and non-pharmacologic comfort measures Outcome: Progressing   Problem: Health Behavior/Discharge Planning: Goal: Ability to manage health-related needs will improve Outcome: Progressing   Problem: Clinical Measurements: Goal: Ability to maintain clinical measurements within normal limits will improve Outcome: Progressing Goal: Will remain free from infection Outcome: Progressing Goal: Diagnostic test results will improve Outcome: Progressing Goal: Respiratory complications will improve Outcome: Progressing Goal: Cardiovascular complication will be avoided Outcome: Progressing   Problem: Activity: Goal: Risk for activity intolerance will decrease Outcome: Not Progressing   Problem: Nutrition: Goal: Adequate nutrition will be maintained Outcome: Progressing   Problem: Coping: Goal: Level of anxiety will decrease Outcome: Not Progressing   Problem: Elimination: Goal: Will not experience complications related to bowel motility Outcome: Progressing Goal: Will not experience complications related to urinary retention Outcome: Progressing   Problem: Pain Managment: Goal: General experience of comfort will improve and/or be controlled Outcome: Progressing   Problem: Safety: Goal: Ability to remain free from injury will improve Outcome: Progressing   Problem: Skin Integrity: Goal: Risk for impaired skin integrity will decrease Outcome: Progressing

## 2024-04-30 NOTE — ED Notes (Signed)
Dr. Ghimire at bedside 

## 2024-04-30 NOTE — ED Notes (Signed)
 Pt BP trending down again. MD Ghimire made aware. No new orders at this time

## 2024-04-30 NOTE — Progress Notes (Signed)
 [facility] night cross cover note:   Patient with dementia, is confused, with wife at bedside requesting medication to help patient sleep. I subsequently ordered a one-time scheduled dose of Seroquel as well as placed order for prn melatonin for refractory insomnia.    Eva Pore, DO Hospitalist

## 2024-04-30 NOTE — Progress Notes (Signed)
   04/30/24 1351  TOC Brief Assessment  Insurance and Status Reviewed  Patient has primary care physician Yes  Home environment has been reviewed From home c/supportive wife  Prior level of function: Independent c/ADLs  Prior/Current Home Services No current home services  Social Drivers of Health Review SDOH reviewed no interventions necessary  Readmission risk has been reviewed Yes  Transition of care needs no transition of care needs at this time   Pt in [facility] c/hypotension, A-fib c/RVR, weakness, fall at home. Hx of paroxysmal atrial fibrillation, hypertension, hyperlipidemia, dementia, sleep apnea.   Current DME: cane, walker, BSC, walk-in shower  Outpatient PT referral sent to: [facility] [address] [place],  [place]  [address] Ph: [phone] Fax: [phone]

## 2024-05-01 DIAGNOSIS — F0150 Vascular dementia without behavioral disturbance: Secondary | ICD-10-CM | POA: Diagnosis not present

## 2024-05-01 DIAGNOSIS — Z823 Family history of stroke: Secondary | ICD-10-CM | POA: Diagnosis not present

## 2024-05-01 DIAGNOSIS — Z91048 Other nonmedicinal substance allergy status: Secondary | ICD-10-CM | POA: Diagnosis not present

## 2024-05-01 DIAGNOSIS — Z87891 Personal history of nicotine dependence: Secondary | ICD-10-CM | POA: Diagnosis not present

## 2024-05-01 DIAGNOSIS — Z79899 Other long term (current) drug therapy: Secondary | ICD-10-CM | POA: Diagnosis not present

## 2024-05-01 DIAGNOSIS — I4891 Unspecified atrial fibrillation: Secondary | ICD-10-CM | POA: Diagnosis present

## 2024-05-01 DIAGNOSIS — M25462 Effusion, left knee: Secondary | ICD-10-CM | POA: Diagnosis not present

## 2024-05-01 DIAGNOSIS — G9341 Metabolic encephalopathy: Secondary | ICD-10-CM | POA: Diagnosis not present

## 2024-05-01 DIAGNOSIS — R1312 Dysphagia, oropharyngeal phase: Secondary | ICD-10-CM | POA: Diagnosis not present

## 2024-05-01 DIAGNOSIS — I480 Paroxysmal atrial fibrillation: Secondary | ICD-10-CM

## 2024-05-01 DIAGNOSIS — G250 Essential tremor: Secondary | ICD-10-CM | POA: Diagnosis not present

## 2024-05-01 DIAGNOSIS — G4733 Obstructive sleep apnea (adult) (pediatric): Secondary | ICD-10-CM | POA: Diagnosis not present

## 2024-05-01 DIAGNOSIS — I4819 Other persistent atrial fibrillation: Secondary | ICD-10-CM | POA: Diagnosis not present

## 2024-05-01 DIAGNOSIS — Z96651 Presence of right artificial knee joint: Secondary | ICD-10-CM | POA: Diagnosis not present

## 2024-05-01 DIAGNOSIS — I6782 Cerebral ischemia: Secondary | ICD-10-CM | POA: Diagnosis not present

## 2024-05-01 DIAGNOSIS — I69318 Other symptoms and signs involving cognitive functions following cerebral infarction: Secondary | ICD-10-CM | POA: Diagnosis not present

## 2024-05-01 DIAGNOSIS — E785 Hyperlipidemia, unspecified: Secondary | ICD-10-CM | POA: Diagnosis not present

## 2024-05-01 DIAGNOSIS — F05 Delirium due to known physiological condition: Secondary | ICD-10-CM | POA: Diagnosis not present

## 2024-05-01 DIAGNOSIS — J690 Pneumonitis due to inhalation of food and vomit: Secondary | ICD-10-CM | POA: Diagnosis not present

## 2024-05-01 DIAGNOSIS — R29818 Other symptoms and signs involving the nervous system: Secondary | ICD-10-CM | POA: Diagnosis not present

## 2024-05-01 DIAGNOSIS — Z8249 Family history of ischemic heart disease and other diseases of the circulatory system: Secondary | ICD-10-CM | POA: Diagnosis not present

## 2024-05-01 DIAGNOSIS — Z7901 Long term (current) use of anticoagulants: Secondary | ICD-10-CM | POA: Diagnosis not present

## 2024-05-01 DIAGNOSIS — D6869 Other thrombophilia: Secondary | ICD-10-CM | POA: Diagnosis not present

## 2024-05-01 DIAGNOSIS — I4892 Unspecified atrial flutter: Secondary | ICD-10-CM | POA: Diagnosis not present

## 2024-05-01 DIAGNOSIS — I493 Ventricular premature depolarization: Secondary | ICD-10-CM | POA: Diagnosis not present

## 2024-05-01 DIAGNOSIS — Z96611 Presence of right artificial shoulder joint: Secondary | ICD-10-CM | POA: Diagnosis not present

## 2024-05-01 MED ORDER — QUETIAPINE 12.5 MG HALF TABLET
12.5000 mg | ORAL_TABLET | Freq: Every day | ORAL | Status: DC
  Administered 2024-05-01: 12.5 mg via ORAL
  Filled 2024-05-01: qty 1

## 2024-05-01 MED ORDER — OLANZAPINE 5 MG PO TBDP
5.0000 mg | ORAL_TABLET | Freq: Every day | ORAL | Status: DC
  Administered 2024-05-01 – 2024-05-04 (×4): 5 mg via ORAL
  Filled 2024-05-01 (×4): qty 1

## 2024-05-01 NOTE — Progress Notes (Signed)
 Cardiologist:  Court  Subjective:  Denies SSCP, palpitations or Dyspnea Got sleeping pill last night and cognitive defect severe   Objective:  Vitals:   05/01/24 0329 05/01/24 0500 05/01/24 0600 05/01/24 0700  BP: 100/77 95/60 91/78  109/81  Pulse: 98 97 93 (!) 114  Resp: 18 (!) 22 16 19   Temp: 98.1 F (36.7 C)     TempSrc: Axillary     SpO2: 95% 94% 93% 96%  Weight:      Height:        Intake/Output from previous day:  Intake/Output Summary (Last 24 hours) at 05/01/2024 0842 Last data filed at 05/01/2024 0734 Gross per 24 hour  Intake 907.76 ml  Output 800 ml  Net 107.76 ml    Physical Exam: Elderly male Confused poor memory Lungs clear SEM of AV sclerosis   Abdomen benign No edema   Lab Results: Basic Metabolic Panel: Recent Labs    04/29/24 1605 04/30/24 0327  NA 138 141  K 4.4 4.0  CL 103 107  CO2 25 27  GLUCOSE 85 98  BUN 21 15  CREATININE 1.15 1.04  CALCIUM  8.7* 8.7*  MG  --  2.0    CBC: Recent Labs    04/29/24 1605 04/30/24 0327  WBC 6.8 4.9  HGB 13.6 13.0  HCT 42.4 40.0  MCV 97.0 95.9  PLT 208 173    Thyroid  Function Tests: Recent Labs    04/30/24 0327  TSH 2.389    Imaging: DG Chest 2 View Result Date: 04/29/2024 CLINICAL DATA:  Atrial fibrillation, weakness EXAM: CHEST - 2 VIEW COMPARISON:  04/04/2023 FINDINGS: Frontal and lateral views of the chest demonstrate an unremarkable cardiac silhouette. No acute airspace disease, effusion, or pneumothorax. Stable right shoulder arthroplasty. No acute fractures. IMPRESSION: 1. No acute intrathoracic process. Electronically Signed   By: Ozell Daring M.D.   On: 04/29/2024 17:18   CT Head Wo Contrast Result Date: 04/29/2024 EXAM: CT HEAD WITHOUT CONTRAST 04/29/2024 04:27:49 PM TECHNIQUE: CT of the head was performed without the administration of intravenous contrast. Automated exposure control, iterative reconstruction, and/or weight based adjustment of the mA/kV was utilized to reduce  the radiation dose to as low as reasonably achievable. COMPARISON: MRI head 06/04/2024 CLINICAL HISTORY: Head trauma, minor (Age >= 65y) FINDINGS: BRAIN AND VENTRICLES: No acute hemorrhage. No evidence of acute infarct. No hydrocephalus. No extra-axial collection. No mass effect or midline shift.ccl ORBITS: No acute abnormality. SINUSES: No acute abnormality. SOFT TISSUES AND SKULL: No acute soft tissue abnormality. No skull fracture. IMPRESSION: 1. No acute intracranial abnormality. Electronically signed by: Gilmore Molt MD 04/29/2024 05:08 PM EST RP Workstation: [id number]    Cardiac Studies:  ECG: afib rate 103    Telemetry: afib rate 90-100 bpm  Echo: 05/12/23 EF 60-65% no LVH AV sclerosis   Medications:    apixaban   5 mg Oral BID   atorvastatin   20 mg Oral q1800   flecainide  100 mg Oral Q12H   memantine   10 mg Oral BID   metoprolol  tartrate  37.5 mg Oral BID      lactated ringers  75 mL/hr at 05/01/24 0453    Assessment/Plan:   Afib:  second episode since 03/2023. Had missed some doses of eliquis  according to wife. On relatively high dose of lopressor . Flecainide started 04/30/24 Long discussion with wife favor non aggressive approach and avoid acute use of propofol  for TEE/DCC. Plan to continue DOAC, beta blocker and flecainide with outpatient f/u in 2-4 weeks to consider  DCC if he does not convert on AAT.  Cognitive Decline:  severe worse this am from being in hospital and sleeping pill On Namenda  but not effective. Outpatient f/u [facility].   Will arrange outpatient f/u with Dr [facility]   Maude Emmer 05/01/2024, 8:42 AM

## 2024-05-01 NOTE — Plan of Care (Signed)

## 2024-05-01 NOTE — Discharge Instructions (Signed)

## 2024-05-01 NOTE — Progress Notes (Signed)
 [facility]   ROUNDING   NOTE Bryan Hart FMW:[id number]  DOB: 11-08-1950  DOA: 04/29/2024  PCP: [name], DO  05/01/2024,5:49 AM  LOS: 0 days    Code Status: Full code     from: Home   73 year old male right knee repair 2023 Underlying oropharyngeal dysphagia Previously asymptomatic PVCs followed by Dr. [name] paroxysmal A-fib 04/04/2023 which spontaneously converted with IV Cardizem  and was started on Eliquis  for CHA2DS2-VASc >4-last seen by Dr. [name] 11/07/2023 Prior TIA Sleep apnea intolerant of CPAP Cognitive decline over the past year followed by Dr. [name]  Chronology  11//25 brought to ED dizziness fatigue and a fall while on Eliquis  found to be in rapid A-fib-rate slowed down with IV Lopressor  Cardiology consulted-planning watchful waiting?  Flecainide?  TEE DCCV    Pertinent imaging/studies till date  CXR 2 view no acute intrathoracic process CT head no acute intracranial abnormality no skull fracture    Assessment  & Plan :    Paroxysmal A-fib CHADVASC >4 currently on Eliquis  Appreciate cardiology recommendations-continues 37.5 twice daily Lopressor  with PRNs of IV metoprolol  Cardiology started flecainide 100 Q12 Strict control limited by hypotension and was given a bolus of 500 cc overnight and is currently on saline 75 cc/H Further management as per cardiology-?  DCCV Fall secondary to dizziness/potential hypotension Obtain orthostatics if able Do not think the change from Namenda  to Aricept will make a huge difference and Aricept has poor effect after 6 months--- patient is followed by neurology and I would defer these changes or advanced planning to them Monitor trends of blood pressure carefully and defer to cardiology planning Previous TIA probably in 2019?-1 cm infarct left motor strip Postinfarct cognitive decline History is unclear-is on atorvastatin  20 which we will continue Not really a good candidate for antiplatelet in addition to anticoagulant in the  setting of fall Sleep apnea intolerant of CPAP Monitor only Previous knee repair Sees Dr. [name] of orthopedics for this and has a left knee effusion that is being managed by them Will CC him at discharge for care coordination given he is on Eliquis  which would have to be stopped 2 to 3 days prior to any procedure Multi-infarct dementia with oropharyngeal dysphagia Seems to have progressive dementia based on last note by Dr. Donnise sometimes stopped recognizing his own wife He does not seem to have any dysphagia at this time MMSE 22/30 Outpatient further management     Data Reviewed today:  Sodium 141 potassium 4.0 chloride 107 BUN/creatinine 15/1.0 WBC 4.9 hemoglobin 13.0 platelet 173 TSH 2.38   DVT prophylaxis: Eliquis   Status is: Observation The patient will require care spanning > 2 midnights and should be moved to inpatient because:    Still needs rate control    Dispo/Global plan: Await PT eval await cardiology input   Time 40   Subjective:   Tells me that the month is 12 Does not know place-tells me that this is a cardioversion and cannot tell me which one Recognizes wife of 52 years can tell me her name No chest pain no other spontaneous complaint    Objective + exam Vitals:   04/30/24 2200 04/30/24 2248 04/30/24 2300 05/01/24 0329  BP:  (!) 83/51 104/71 100/77  Pulse:  96 (!) 106 98  Resp: 18 20 20 18   Temp:   99.5 F (37.5 C) 98.1 F (36.7 C)  TempSrc:   Axillary Axillary  SpO2:  98% 90% 95%  Weight:      Height:  Filed Weights   04/30/24 1543  Weight: 73.6 kg     Examination: EOMI NCAT no icterus no pallor Neck soft supple No JVD Chest clear S1-S2 A-fib on monitors rates 0100-0130 Abdomen soft no rebound No lower extremity edema Postop changes right knee Neuro grossly intact  Scheduled Meds:  apixaban   5 mg Oral BID   atorvastatin   20 mg Oral q1800   flecainide  100 mg Oral Q12H   memantine   10 mg Oral BID    metoprolol  tartrate  37.5 mg Oral BID   Continuous Infusions:  lactated ringers  75 mL/hr at 05/01/24 0453   acetaminophen  **OR** acetaminophen , melatonin, metoprolol  tartrate  Bryan Grimes, MD  [facility]

## 2024-05-01 NOTE — Progress Notes (Signed)
   05/01/24 0843  Vitals  Temp 98.1 F (36.7 C)  Temp Source Oral  BP Location Left Arm  BP Method Automatic  Pulse Rate Source Monitor  Resp 20  MEWS COLOR  MEWS Score Color Yellow  Orthostatic Lying   BP- Lying 107/85  Pulse- Lying 91  Orthostatic Sitting  BP- Sitting (!) 111/101  Pulse- Sitting 111  Orthostatic Standing at 0 minutes  BP- Standing at 0 minutes (!) 58/39  Pulse- Standing at 0 minutes 138  Orthostatic Standing at 3 minutes  BP- Standing at 3 minutes (!) 83/72  Pulse- Standing at 3 minutes 155  Oxygen Therapy  SpO2 95 %  O2 Device Room Air  MEWS Score  MEWS Temp 0  MEWS Systolic 0  MEWS Pulse 2  MEWS RR 0  MEWS LOC 0  MEWS Score 2

## 2024-05-01 NOTE — Plan of Care (Signed)

## 2024-05-01 NOTE — Progress Notes (Signed)
 Physical Therapy Treatment Patient Details Name: Bryan Hart MRN: [id number] DOB: [birth year] Today's Date: 05/01/2024   History of Present Illness 73 yo M presented to ED 04/29/24 in Afib with hypotension and fall. PMhx: CVA, HLD, HTN, AFib, AV block, Rt TKA, dementia, Rt shoulder hemiarthroplasty    PT Comments  Yesterday pt seen in ED and amb 200' with supervision and no assistive device. Today called by nursing stating he was weak and unable to amb. Pt sitting EOB on arrival. Checked orthostatics and pt with significant orthostasis and symptomatic as well as HR to 160's. Unable to attempt ambulation. Expect mobility will return close to baseline if these medical issues resolve. Will continue to follow.   Orthostatic BPs  Sitting 88/70 HR 150  Standing 66/46 HR 164  Standing after 3 min Unable to remain standing x 3 minutes   4 minutes after return to sitting 99/71 HR 150      If plan is discharge home, recommend the following: Assist for transportation;Supervision due to cognitive status;Assistance with cooking/housework;A little help with walking and/or transfers   Can travel by private vehicle        Equipment Recommendations  None recommended by PT    Recommendations for Other Services       Precautions / Restrictions Precautions Precautions: Fall;Other (comment) Recall of Precautions/Restrictions: Impaired Precaution/Restrictions Comments: Watch orthostatics Restrictions Weight Bearing Restrictions Per Provider Order: No     Mobility  Bed Mobility               General bed mobility comments: Pt sitting EOB    Transfers Overall transfer level: Needs assistance Equipment used: Rolling walker (2 wheels) Transfers: Sit to/from Stand Sit to Stand: Min assist           General transfer comment: assist for balance    Ambulation/Gait               General Gait Details: Unable due to orthostatic   Stairs             Wheelchair  Mobility     Tilt Bed    Modified Rankin (Stroke Patients Only)       Balance Overall balance assessment: History of Falls, Needs assistance Sitting-balance support: No upper extremity supported, Feet supported Sitting balance-Leahy Scale: Fair     Standing balance support: Bilateral upper extremity supported, During functional activity, Single extremity supported Standing balance-Leahy Scale: Poor Standing balance comment: UE support                            Communication Communication Communication: No apparent difficulties  Cognition Arousal: Alert Behavior During Therapy: Flat affect   PT - Cognitive impairments: Memory, Safety/Judgement, History of cognitive impairments                         Following commands: Intact      Cueing Cueing Techniques: Verbal cues  Exercises      General Comments        Pertinent Vitals/Pain Pain Assessment Pain Assessment: No/denies pain    Home Living                          Prior Function            PT Goals (current goals can now be found in the care plan section) Acute Rehab PT Goals Patient Stated Goal: walk  Progress towards PT goals: Not progressing toward goals - comment (orthostatic)    Frequency    Min 2X/week      PT Plan      Co-evaluation              AM-PAC PT 6 Clicks Mobility   Outcome Measure  Help needed turning from your back to your side while in a flat bed without using bedrails?: None Help needed moving from lying on your back to sitting on the side of a flat bed without using bedrails?: A Little Help needed moving to and from a bed to a chair (including a wheelchair)?: A Little Help needed standing up from a chair using your arms (e.g., wheelchair or bedside chair)?: A Little Help needed to walk in hospital room?: Total Help needed climbing 3-5 steps with a railing? : Total 6 Click Score: 15    End of Session Equipment Utilized During  Treatment: Gait belt Activity Tolerance: Treatment limited secondary to medical complications (Comment) Patient left: in bed;with call bell/phone within reach;with bed alarm set Nurse Communication: Mobility status;Other (comment) (orthostatic hypotension) PT Visit Diagnosis: Other abnormalities of gait and mobility (R26.89);History of falling (Z91.81);Difficulty in walking, not elsewhere classified (R26.2)     Time: 8398-8376 PT Time Calculation (min) (ACUTE ONLY): 22 min  Charges:    $Therapeutic Activity: 8-22 mins PT General Charges $$ ACUTE PT VISIT: 1 Visit                     [facility] PT Acute Rehabilitation Services Office [phone]    [name] 05/01/2024, 4:37 PM

## 2024-05-02 DIAGNOSIS — I480 Paroxysmal atrial fibrillation: Secondary | ICD-10-CM | POA: Diagnosis not present

## 2024-05-02 DIAGNOSIS — I4819 Other persistent atrial fibrillation: Secondary | ICD-10-CM | POA: Diagnosis not present

## 2024-05-02 LAB — CBC WITH DIFFERENTIAL/PLATELET
Abs Immature Granulocytes: 0.01 K/uL (ref 0.00–0.07)
Basophils Absolute: 0 K/uL (ref 0.0–0.1)
Basophils Relative: 0 %
Eosinophils Absolute: 0.1 K/uL (ref 0.0–0.5)
Eosinophils Relative: 2 %
HCT: 35 % — ABNORMAL LOW (ref 39.0–52.0)
Hemoglobin: 11.4 g/dL — ABNORMAL LOW (ref 13.0–17.0)
Immature Granulocytes: 0 %
Lymphocytes Relative: 23 %
Lymphs Abs: 1.1 K/uL (ref 0.7–4.0)
MCH: 31.3 pg (ref 26.0–34.0)
MCHC: 32.6 g/dL (ref 30.0–36.0)
MCV: 96.2 fL (ref 80.0–100.0)
Monocytes Absolute: 0.5 K/uL (ref 0.1–1.0)
Monocytes Relative: 10 %
Neutro Abs: 3.2 K/uL (ref 1.7–7.7)
Neutrophils Relative %: 65 %
Platelets: 164 K/uL (ref 150–400)
RBC: 3.64 MIL/uL — ABNORMAL LOW (ref 4.22–5.81)
RDW: 12.9 % (ref 11.5–15.5)
WBC: 5 K/uL (ref 4.0–10.5)
nRBC: 0 % (ref 0.0–0.2)

## 2024-05-02 LAB — BASIC METABOLIC PANEL WITH GFR
Anion gap: 11 (ref 5–15)
BUN: 11 mg/dL (ref 8–23)
CO2: 23 mmol/L (ref 22–32)
Calcium: 7.9 mg/dL — ABNORMAL LOW (ref 8.9–10.3)
Chloride: 107 mmol/L (ref 98–111)
Creatinine, Ser: 0.82 mg/dL (ref 0.61–1.24)
GFR, Estimated: >60 mL/min (ref >60)
Glucose, Bld: 75 mg/dL (ref 70–99)
Potassium: 4.2 mmol/L (ref 3.5–5.1)
Sodium: 141 mmol/L (ref 135–145)

## 2024-05-02 MED ORDER — QUETIAPINE 12.5 MG HALF TABLET
12.5000 mg | ORAL_TABLET | Freq: Every day | ORAL | Status: DC
  Administered 2024-05-02 – 2024-05-04 (×3): 12.5 mg via ORAL
  Filled 2024-05-02 (×3): qty 1

## 2024-05-02 MED ORDER — POLYETHYLENE GLYCOL 3350 17 G PO PACK
17.0000 g | PACK | Freq: Every day | ORAL | Status: DC
  Administered 2024-05-03 – 2024-05-06 (×4): 17 g via ORAL
  Filled 2024-05-02 (×4): qty 1

## 2024-05-02 NOTE — Progress Notes (Signed)
 [facility]   ROUNDING   NOTE Eliyohu Class Jacquez FMW:[id number]  DOB: Jan 31, 1951  DOA: 04/29/2024  PCP: [name], DO  05/02/2024,10:42 AM  LOS: 1 day    Code Status: Full code     from: Home   73 year old male right knee repair 2023 Underlying oropharyngeal dysphagia Previously asymptomatic PVCs followed by Dr. [name] paroxysmal A-fib 04/04/2023 which spontaneously converted with IV Cardizem  and was started on Eliquis  for CHA2DS2-VASc >4-last seen by Dr. [name] 11/07/2023 Prior TIA Sleep apnea intolerant of CPAP Cognitive decline over the past year followed by Dr. [name]  Chronology  11//25 brought to ED dizziness fatigue and a fall while on Eliquis  found to be in rapid A-fib-rate slowed down with IV Lopressor  Cardiology consulted-planning watchful waiting?  Flecainide?  TEE DCCV    Pertinent imaging/studies till date  CXR 2 view no acute intrathoracic process CT head no acute intracranial abnormality no skull fracture    Assessment  & Plan :    Paroxysmal A-fib CHADVASC >4 currently on Eliquis  Defer DCCV per family request-reacted poorly to anesthesia for knee repair previously and they would prefer to avoid if at all possible Per cardiology-Flecainide 100 every 12 metoprolol  37.5 twice daily, apixaban  5 twice daily Fall secondary to dizziness/potential hypotension Positive orthostatics-may preclude strict A-flutter fib control  Do not think the change from Namenda  to Aricept makes a difference-defer to neurology as outpatient Monitor trends of blood pressure carefully and defer to cardiology planning Previous TIA probably in 2019?-1 cm infarct left motor strip Postinfarct cognitive decline History is unclear-is on atorvastatin  20 which we will continue Not really a good candidate for antiplatelet in addition to anticoagulant in the setting of fall Sleep apnea intolerant of CPAP Monitor only Previous knee repair Sees Dr. [name] of orthopedics for this and has a left knee  effusion that is being managed by them Will CC him at discharge for care coordination given he is on Eliquis  which would have to be stopped 2 to 3 days prior to any procedure Multi-infarct dementia with oropharyngeal dysphagia, previous MMSE 22/30 Seems to have progressive dementia based on last note by Dr. Donnise sometimes stopped recognizing his own wife Zydus 5 mg every afternoon, will schedule Seroquel for around 1800 every day Outpatient further management  Data Reviewed today:  Sodium 141 potassium 4.2 BUN/creatinine 11/0.8 WBC 5.0 hemoglobin 11.4 platelet 164  DVT prophylaxis: Eliquis   Status is: Observation The patient will require care spanning > 2 midnights and should be moved to inpatient because:    Heart rate not controlled-needs improvement prior to discharge    Dispo/Global plan: Await PT eval await cardiology input   Time 40   Subjective:   Seems to be more self according to his wife Low-dose Seroquel seem to help last night Nursing messaged regarding hypotension with a MAP in the 70s-heart rate still remains in the 120s to 130 range   Objective + exam Vitals:   05/01/24 2352 05/02/24 0300 05/02/24 0748 05/02/24 0924  BP: (!) 117/94 126/89 (!) 107/94 90/76  Pulse: (!) 107 (!) 103 96   Resp: 15 17 17    Temp: 97.8 F (36.6 C) 97.7 F (36.5 C) 97.7 F (36.5 C)   TempSrc: Oral Oral Oral   SpO2: 98% 96% 97%   Weight:      Height:       Filed Weights   04/30/24 1543  Weight: 73.6 kg     Examination: EOMI NCAT no icterus no pallor Neck soft supple No  JVD S1-S2 tachycardic-irregularly irregular Cannot appreciate murmur Chest is clear posterolaterally Abdomen soft No lower extremity edema Mild confusion but improved from yesterday   Scheduled Meds:  apixaban   5 mg Oral BID   atorvastatin   20 mg Oral q1800   flecainide  100 mg Oral Q12H   memantine   10 mg Oral BID   metoprolol  tartrate  37.5 mg Oral BID   OLANZapine zydis  5 mg Oral  Daily   QUEtiapine  12.5 mg Oral q1800   Continuous Infusions:  lactated ringers  75 mL/hr at 05/02/24 0459   acetaminophen  **OR** acetaminophen , melatonin, metoprolol  tartrate  Jai-Gurmukh Shawnique Mariotti, MD  [facility]

## 2024-05-02 NOTE — Progress Notes (Signed)
  Progress Note  Patient Name: Bryan Hart Date of Encounter: 05/02/2024 [facility] Cardiologist: Dorn Lesches, MD   Interval Summary   Reports no symptoms with his HR Has not yet received AM meds  Him and his wife are okay to proceed with plan discussed yesterday for follow up outpatient to schedule possible DCCV   Vital Signs Vitals:   05/01/24 1949 05/01/24 2352 05/02/24 0300 05/02/24 0748  BP: 95/67 (!) 117/94 126/89 (!) 107/94  Pulse: 97 (!) 107 (!) 103 96  Resp: 14 15 17 17   Temp: 98.3 F (36.8 C) 97.8 F (36.6 C) 97.7 F (36.5 C) 97.7 F (36.5 C)  TempSrc: Oral Oral Oral Oral  SpO2: 98% 98% 96% 97%  Weight:      Height:        Intake/Output Summary (Last 24 hours) at 05/02/2024 0811 Last data filed at 05/02/2024 9390 Gross per 24 hour  Intake 1946.5 ml  Output 2475 ml  Net -528.5 ml      04/30/2024    3:43 PM 02/22/2024   11:09 AM 11/07/2023   10:37 AM  Last 3 Weights  Weight (lbs) 162 lb 3.2 oz 170 lb 170 lb  Weight (kg) 73.573 kg 77.111 kg 77.111 kg     Telemetry/ECG  A-Fib with HR 90-110s, up to 130-150s this AM - Personally Reviewed  Physical Exam  GEN: No acute distress.   Neck: No JVD Cardiac: irregularly irregular, tachycardic  Respiratory: Clear to auscultation bilaterally. GI: Soft, nontender, non-distended  MS: No edema  Assessment & Plan   Paroxysmal A-Fib Previous episode of A-Fib Oct. 2024 Wife reports missed doses of Eliquis  at home  Previously on high dose BB Started on Flecainide 11/4 Currently in A-Fib with HR 120s  Continue Eliquis  5 mg BID Continue flecainide 100 mg BID Continue Lopressor  37.5 mg BID Plan for outpatient follow up in ~3 weeks to consider DCCV if not converted   For questions or updates, please contact [facility] Please consult [email or web address] for contact info under         Signed, Waddell DELENA Donath, PA-C

## 2024-05-02 NOTE — Plan of Care (Signed)
  Problem: Nutrition: Goal: Adequate nutrition will be maintained Outcome: Progressing   Problem: Elimination: Goal: Will not experience complications related to bowel motility Outcome: Progressing Goal: Will not experience complications related to urinary retention Outcome: Progressing   Problem: Pain Managment: Goal: General experience of comfort will improve and/or be controlled Outcome: Progressing   Problem: Safety: Goal: Ability to remain free from injury will improve Outcome: Progressing   Problem: Skin Integrity: Goal: Risk for impaired skin integrity will decrease Outcome: Progressing   Problem: Activity: Goal: Risk for activity intolerance will decrease Outcome: Not Progressing

## 2024-05-02 NOTE — Progress Notes (Addendum)
 Physical Therapy Treatment Patient Details Name: Bryan Hart MRN: [id number] DOB: 1951-03-06 Today's Date: 05/02/2024   History of Present Illness 73 yo M presented to ED 04/29/24 in Afib with hypotension and fall. Pt with symptomatic orthostatic hypotension 11/5 and 11/6 PT sessions. PMhx: CVA, HLD, HTN, AFib, AV block, Rt TKA, dementia, Rt shoulder hemiarthroplasty    PT Comments  Pt received in supine, alert and pleasantly agreeable to therapy session, not fully oriented as per baseline cog deficit. Pt not progressing toward goals this date, he appears limited primarily due to symptomatic orthostatic hypotension, with pt reporting I don't feel right sitting EOB and especially with standing trials, pt unable to maintain upright without +2 maxA via HHA and unable to weight shift in stance. HR tachy to >150 bpm with standing, and maintains 120-140's bpm sitting EOB before/after standing. SpO2 WFL on RA. Pt may benefit from trial of BLE compression and/or abdominal binder next session to see if this helps with hemodynamic stability, RN notified. Pt not yet safe to DC home given functional decline; hopeful for improvement in standing/gait tolerance next session if HR/BP more stable with postural changes. Pt continues to benefit from PT services to progress toward functional mobility goals.   05/02/24 0924  Vital Signs  BP 90/76  Patient Position (if appropriate) Orthostatic Vitals  Orthostatic Lying   BP- Lying 110/90  Pulse- Lying 129  Orthostatic Sitting  BP- Sitting 102/80  Pulse- Sitting 135  Orthostatic Standing at 0 minutes  BP- Standing at 0 minutes  (84/66 (72) after standing and transfer to sitting, pt c/o foot pain unable to maintain standing long enough for BP reading)  Pulse- Standing at 0 minutes 147 (up to 150's bpm standing)   After return to supine and HOB elevated to ~45 deg, BP re-checked and reading 77/68 (73) with HR 136 bpm, RN notified.   If plan is discharge home,  recommend the following: Assist for transportation;Supervision due to cognitive status;Assistance with cooking/housework;Two people to help with walking and/or transfers;A lot of help with bathing/dressing/bathroom;Direct supervision/assist for medications management;Direct supervision/assist for financial management;Help with stairs or ramp for entrance   Can travel by private vehicle        Equipment Recommendations  None recommended by PT (may need wheelchair pending improvement in BP with standing)    Recommendations for Other Services       Precautions / Restrictions Precautions Precautions: Fall;Other (comment) Recall of Precautions/Restrictions: Impaired Precaution/Restrictions Comments: Watch orthostatics and HR Restrictions Weight Bearing Restrictions Per Provider Order: No     Mobility  Bed Mobility Overal bed mobility: Needs Assistance Bed Mobility: Supine to Sit, Sit to Supine     Supine to sit: Mod assist     General bed mobility comments: modA from flat bed with HHA for trunk lift assist, pt assisting to move BLE toward EOB but needing increased time and assist to perform 2/2 motor sequencing difficulty. Pt HR tachy at rest but more tachy sitting. Needing +2 modA for return to supine 2/2 fatigue/cog deficit.    Transfers Overall transfer level: Needs assistance Equipment used: 2 person hand held assist Transfers: Sit to/from Stand, Bed to chair/wheelchair/BSC Sit to Stand: Max assist, +2 physical assistance Stand pivot transfers: Max assist, +2 physical assistance         General transfer comment: from EOB with +2 HHA, pt standing on fall risk mat which may be impacting his balance, but mostly needing increased assist due to posterior bias and pt c/o bil  arch foot pain and heel pain despite softer mat surface under his feet. Plan to don shoes prior to standing next session, pt pivoted slightly toward [facility] prior to sitting with manual assist to slide foot/leg  toward HOB, did not attempt additional trials due to noted decrease in BP sitting EOB after standing trial and pt tachy to >150 bpm when standing.    Ambulation/Gait               General Gait Details: Unable due to orthostatic hypotension   Stairs             Wheelchair Mobility     Tilt Bed    Modified Rankin (Stroke Patients Only)       Balance Overall balance assessment: History of Falls, Needs assistance Sitting-balance support: No upper extremity supported, Feet supported, Single extremity supported Sitting balance-Leahy Scale: Fair   Postural control: Posterior lean Standing balance support: Bilateral upper extremity supported, During functional activity Standing balance-Leahy Scale: Zero Standing balance comment: +2 maxA, pt unable to maintain static standing this date 2/2 c/o bil foot pain and tachycardia/posterior instability                            Communication Communication Communication: No apparent difficulties  Cognition Arousal: Alert Behavior During Therapy: Flat affect   PT - Cognitive impairments: Memory, Safety/Judgement, History of cognitive impairments, Sequencing, Problem solving, Awareness                       PT - Cognition Comments: Pt with difficulty naming symptoms during orthostatic BP assessment, but is able to report bil heel soreness and arch pain when standing. Following commands: Intact      Cueing Cueing Techniques: Verbal cues  Exercises      General Comments General comments (skin integrity, edema, etc.): RN notified his heels feel soft, would benefit from bil Prevalon boots and trial of BLE compresssion socks to see if this helps with hemodynamic stability with postural changes. VS as above.      Pertinent Vitals/Pain Pain Assessment Pain Assessment: Faces Faces Pain Scale: Hurts even more Pain Location: bil heels and arches of his feet weight bearing Pain Descriptors / Indicators:  Grimacing, Discomfort, Guarding, Sore, Sharp Pain Intervention(s): Limited activity within patient's tolerance, Monitored during session, Repositioned, Other (comment) (RN notified)    Home Living                          Prior Function            PT Goals (current goals can now be found in the care plan section) Acute Rehab PT Goals Patient Stated Goal: Per spouse, for him to be able to walk in order to go home and stable BP in standing. PT Goal Formulation: With patient/family Time For Goal Achievement: 05/14/24 Progress towards PT goals: Not progressing toward goals - comment;PT to reassess next treatment    Frequency    Min 2X/week      PT Plan      Co-evaluation              AM-PAC PT 6 Clicks Mobility   Outcome Measure  Help needed turning from your back to your side while in a flat bed without using bedrails?: None Help needed moving from lying on your back to sitting on the side of a flat bed without using bedrails?:  A Lot Help needed moving to and from a bed to a chair (including a wheelchair)?: A Lot Help needed standing up from a chair using your arms (e.g., wheelchair or bedside chair)?: Total Help needed to walk in hospital room?: Total Help needed climbing 3-5 steps with a railing? : Total 6 Click Score: 11    End of Session Equipment Utilized During Treatment: Gait belt Activity Tolerance: Patient limited by pain;Other (comment);Treatment limited secondary to medical complications (Comment) (severe tachy with standing, symptomatic drop in BP) Patient left: in bed;with call bell/phone within reach;with bed alarm set;with family/visitor present;Other (comment) (HOB >40 deg, heels floated, spouse Pat in room) Nurse Communication: Mobility status;Need for lift equipment;Precautions;Other (comment) (BP drop after standing; tachy; needs BLE compression socks trial) PT Visit Diagnosis: Other abnormalities of gait and mobility (R26.89);History of  falling (Z91.81);Difficulty in walking, not elsewhere classified (R26.2)     Time: 0953-1010 PT Time Calculation (min) (ACUTE ONLY): 17 min  Charges:    $Therapeutic Activity: 8-22 mins PT General Charges $$ ACUTE PT VISIT: 1 Visit                     Avory Rahimi P., PTA Acute Rehabilitation Services Secure Chat Preferred 9a-5:30pm Office: [phone]    Connell HERO [facility] 05/02/2024, 1:53 PM

## 2024-05-03 DIAGNOSIS — I480 Paroxysmal atrial fibrillation: Secondary | ICD-10-CM | POA: Diagnosis not present

## 2024-05-03 LAB — BASIC METABOLIC PANEL WITH GFR
Anion gap: 10 (ref 5–15)
BUN: 12 mg/dL (ref 8–23)
CO2: 26 mmol/L (ref 22–32)
Calcium: 8.5 mg/dL — ABNORMAL LOW (ref 8.9–10.3)
Chloride: 102 mmol/L (ref 98–111)
Creatinine, Ser: 1.11 mg/dL (ref 0.61–1.24)
GFR, Estimated: >60 mL/min (ref >60)
Glucose, Bld: 102 mg/dL — ABNORMAL HIGH (ref 70–99)
Potassium: 3.8 mmol/L (ref 3.5–5.1)
Sodium: 138 mmol/L (ref 135–145)

## 2024-05-03 LAB — CBC WITH DIFFERENTIAL/PLATELET
Abs Immature Granulocytes: 0.02 K/uL (ref 0.00–0.07)
Basophils Absolute: 0 K/uL (ref 0.0–0.1)
Basophils Relative: 0 %
Eosinophils Absolute: 0.1 K/uL (ref 0.0–0.5)
Eosinophils Relative: 1 %
HCT: 37.8 % — ABNORMAL LOW (ref 39.0–52.0)
Hemoglobin: 12.4 g/dL — ABNORMAL LOW (ref 13.0–17.0)
Immature Granulocytes: 0 %
Lymphocytes Relative: 19 %
Lymphs Abs: 1.2 K/uL (ref 0.7–4.0)
MCH: 31.6 pg (ref 26.0–34.0)
MCHC: 32.8 g/dL (ref 30.0–36.0)
MCV: 96.2 fL (ref 80.0–100.0)
Monocytes Absolute: 0.8 K/uL (ref 0.1–1.0)
Monocytes Relative: 13 %
Neutro Abs: 4.2 K/uL (ref 1.7–7.7)
Neutrophils Relative %: 67 %
Platelets: 181 K/uL (ref 150–400)
RBC: 3.93 MIL/uL — ABNORMAL LOW (ref 4.22–5.81)
RDW: 12.8 % (ref 11.5–15.5)
WBC: 6.3 K/uL (ref 4.0–10.5)
nRBC: 0 % (ref 0.0–0.2)

## 2024-05-03 MED ORDER — FLECAINIDE ACETATE 100 MG PO TABS: 100.0000 mg | ORAL_TABLET | Freq: Two times a day (BID) | ORAL | 1 refills | Status: DC

## 2024-05-03 MED ORDER — METOPROLOL TARTRATE 25 MG PO TABS
25.0000 mg | ORAL_TABLET | Freq: Two times a day (BID) | ORAL | Status: DC
  Administered 2024-05-03 – 2024-05-06 (×5): 25 mg via ORAL
  Filled 2024-05-03 (×6): qty 1

## 2024-05-03 NOTE — Progress Notes (Signed)
 Physical Therapy Treatment Patient Details Name: Bryan Hart MRN: [id number] DOB: 04-26-1951 Today's Date: 05/03/2024   History of Present Illness 73 yo M presented to ED 04/29/24 in Afib with hypotension and fall. Pt with symptomatic orthostatic hypotension 11/5 and 11/6 PT sessions. PMhx: CVA, HLD, HTN, AFib, AV block, Rt TKA, dementia, Rt shoulder hemiarthroplasty    PT Comments  Pt admitted with above diagnosis. Pt was able to only able to stand at EOB due to signifcant orthostasis.  Wife present and is concerned she cannot care for pt at home.  Would benefit from post acute rehab < 3 hours day.  Will follow acutely.  Pt currently with functional limitations due to the deficits listed below (see PT Problem List). Pt will benefit from acute skilled PT to increase their independence and safety with mobility to allow discharge.     Pt had TED hose on on PT arrival. Orthostatic Lying  BP- Lying 87/65 Pulse- Lying 56 Orthostatic Sitting BP- Sitting  89/67 Pulse- Sitting 63 Orthostatic Standing at 0 minutes BP- Standing at 0 minutes 58/47 Pulse- Standing at 0 minutes 59  On departure, BP was 89/67 with pt back in bed. Pt was unable to walk with PT due to dizziness and weakness.    If plan is discharge home, recommend the following: Assist for transportation;Supervision due to cognitive status;Assistance with cooking/housework;Direct supervision/assist for medications management;Direct supervision/assist for financial management;Help with stairs or ramp for entrance;A little help with bathing/dressing/bathroom;A lot of help with walking and/or transfers;Assistance with feeding   Can travel by private vehicle     No  Equipment Recommendations  Other (comment) (TBA next venue)    Recommendations for Other Services       Precautions / Restrictions Precautions Precautions: Fall;Other (comment) Recall of Precautions/Restrictions: Impaired Precaution/Restrictions Comments: Watch  orthostatics and HR Restrictions Weight Bearing Restrictions Per Provider Order: No     Mobility  Bed Mobility Overal bed mobility: Needs Assistance Bed Mobility: Supine to Sit, Sit to Supine     Supine to sit: Mod assist Sit to supine: Mod assist   General bed mobility comments: modA from flat bed with HHA for trunk lift assist, pt assisting to move BLE toward EOB but needing increased time and assist to perform 2/2 motor sequencing difficulty. Needing modA for return to supine 2/2 fatigue/cog deficit.    Transfers Overall transfer level: Needs assistance Equipment used: Rolling walker (2 wheels) Transfers: Sit to/from Stand, Bed to chair/wheelchair/BSC Sit to Stand: Mod assist, From elevated surface           General transfer comment: Pt needing mod assist to power up to RW with pt standing with flexed knees and flexed trunk and could not stand fully upright even with verbal and tactile cues.Pt also needed increased assist due to posterior bias and pt c/o bil arch foot pain and heel pain. Pt with  decrease in BP sitting EOB and standing.  HR without much change but significant Orthostasis with dizziness reported.    Ambulation/Gait               General Gait Details: unable to progress due to orthostatic hypotension   Stairs             Wheelchair Mobility     Tilt Bed    Modified Rankin (Stroke Patients Only)       Balance Overall balance assessment: History of Falls, Needs assistance Sitting-balance support: No upper extremity supported, Feet supported, Single extremity supported Sitting balance-Leahy  Scale: Fair   Postural control: Posterior lean Standing balance support: Bilateral upper extremity supported, During functional activity Standing balance-Leahy Scale: Poor Standing balance comment: Mod assist and cues,  pt unable to maintain static standing this date 2/2 c/o bil foot pain, orthostasis and posterior instability even with RW use                             Communication Communication Communication: No apparent difficulties  Cognition Arousal: Alert Behavior During Therapy: Flat affect   PT - Cognitive impairments: Memory, Safety/Judgement, History of cognitive impairments, Sequencing, Problem solving, Awareness                       PT - Cognition Comments: Pt reports dizziness with orthostatic BP assessment, but is able to report bil heel soreness and arch pain when standing. Following commands: Intact      Cueing Cueing Techniques: Verbal cues  Exercises      General Comments General comments (skin integrity, edema, etc.): RN and MD notified of BPs.      Pertinent Vitals/Pain Pain Assessment Pain Assessment: Faces Faces Pain Scale: Hurts even more Pain Location: bil heels and arches of his feet weight bearing Pain Descriptors / Indicators: Grimacing, Discomfort, Guarding, Sore, Sharp Pain Intervention(s): Limited activity within patient's tolerance, Monitored during session, Repositioned, Premedicated before session    Home Living                          Prior Function            PT Goals (current goals can now be found in the care plan section) Acute Rehab PT Goals Patient Stated Goal: Per spouse, for him to be able to walk in order to go home and stable BP in standing. Progress towards PT goals: Not progressing toward goals - comment (orthostatic hypotension)    Frequency    Min 2X/week      PT Plan      Co-evaluation              AM-PAC PT 6 Clicks Mobility   Outcome Measure  Help needed turning from your back to your side while in a flat bed without using bedrails?: A Little Help needed moving from lying on your back to sitting on the side of a flat bed without using bedrails?: A Lot Help needed moving to and from a bed to a chair (including a wheelchair)?: Total Help needed standing up from a chair using your arms (e.g., wheelchair or  bedside chair)?: A Lot Help needed to walk in hospital room?: Total Help needed climbing 3-5 steps with a railing? : Total 6 Click Score: 10    End of Session Equipment Utilized During Treatment: Gait belt Activity Tolerance: Patient limited by fatigue;Patient limited by pain;Treatment limited secondary to medical complications (Comment) (symptomatic drop in BP) Patient left: in bed;with call bell/phone within reach;with bed alarm set;with family/visitor present;Other (comment) (HOB >40 deg, heels floated, spouse Pat in room) Nurse Communication: Mobility status;Need for lift equipment;Precautions;Other (comment) (BP drop after standing) PT Visit Diagnosis: Other abnormalities of gait and mobility (R26.89);History of falling (Z91.81);Difficulty in walking, not elsewhere classified (R26.2)     Time: 8782-8746 PT Time Calculation (min) (ACUTE ONLY): 36 min  Charges:    $Therapeutic Activity: 23-37 mins PT General Charges $$ ACUTE PT VISIT: 1 Visit  [facility] M,PT Acute Rehab Services [phone]    Bryan Hart 05/03/2024, 3:54 PM

## 2024-05-03 NOTE — TOC Transition Note (Signed)
 Transition of Care [facility]) - Discharge Note   Patient Details  Name: Bryan Hart MRN: [id number] Date of Birth: 10/14/1950  [facility]) CM/SW Contact:  Roxie KANDICE Stain, RN Phone Number: 05/03/2024, 12:26 PM   Clinical Narrative:    [name] is stable to discharge home. Follow up apt on AVS. OP referral done No other ICM (Inpatient Care Management) needs at this time.    Final next level of care: OP Rehab Barriers to Discharge: Barriers Resolved   Patient Goals and CMS Choice Patient states their goals for this hospitalization and ongoing recovery are:: return home          Discharge Placement               home        Discharge Plan and Services Additional resources added to the After Visit Summary for                                       Social Drivers of Health (SDOH) Interventions SDOH Screenings   Food Insecurity: No Food Insecurity (04/30/2024)  Housing: Low Risk  (04/30/2024)  Transportation Needs: No Transportation Needs (04/30/2024)  Utilities: Not At Risk (04/30/2024)  Social Connections: Patient Declined (04/30/2024)  Tobacco Use: Medium Risk (04/29/2024)     Readmission Risk Interventions     No data to display

## 2024-05-03 NOTE — NC FL2 (Signed)
 [place]  MEDICAID FL2 LEVEL OF CARE FORM     IDENTIFICATION  Patient Name: Bryan Hart Birthdate: 12/16/1950 Sex: male Admission Date (Current Location): 04/29/2024  [facility] and [place] Number:  Producer, Television/film/video and Address:  [facility] [place], [address], [place], [place] [address]      Provider Number: [id number]  Attending Physician Name and Address:  Royal Sill, MD  Relative Name and Phone Number:  Seth Higginbotham; Spouse; [phone]    Current Level of Care: Hospital Recommended Level of Care: Skilled Nursing Facility Prior Approval Number:    Date Approved/Denied:   PASRR Number: [id number]  Discharge Plan: SNF    Current Diagnoses: Patient Active Problem List   Diagnosis Date Noted   Fall at home, initial encounter 04/30/2024   Atrial fibrillation (HCC) 04/29/2024   Atrial fibrillation with RVR (HCC) 04/29/2024   PAF (paroxysmal atrial fibrillation) (HCC) 04/12/2023   Hypercoagulable state due to paroxysmal atrial fibrillation (HCC) 04/12/2023   Asymptomatic PVCs 07/26/2022   Hyperlipidemia 07/26/2022   Family history of heart disease 07/26/2022   Essential hypertension 07/26/2022   Status post total right knee replacement 05/31/2022   Unilateral primary osteoarthritis, left knee 03/28/2022   Unilateral primary osteoarthritis, right knee 03/28/2022   Ceruminosis, bilateral 08/18/2021   Primary osteoarthritis of left knee 04/30/2020   Effusion, right knee 03/31/2020   Degenerative arthritis of right knee 03/31/2020   Bilateral primary osteoarthritis of knee 12/27/2018   AV block 07/29/2018   Central sleep apnea 07/29/2018   Cerebrovascular accident (HCC) 07/29/2018   Primary localized osteoarthrosis of shoulder region 01/13/2012    Orientation RESPIRATION BLADDER Height & Weight     Place, Self, Situation  Normal (Room Air) Continent Weight: 162 lb 3.2 oz (73.6 kg) Height:  6' (182.9 cm)  BEHAVIORAL  SYMPTOMS/MOOD NEUROLOGICAL BOWEL NUTRITION STATUS      Continent Diet (Please see discharge summary)  AMBULATORY STATUS COMMUNICATION OF NEEDS Skin   Limited Assist Verbally Normal                       Personal Care Assistance Level of Assistance  Bathing, Feeding, Dressing Bathing Assistance: Limited assistance Feeding assistance: Limited assistance Dressing Assistance: Limited assistance     Functional Limitations Info  Sight Sight Info: Impaired (Reading Glasses (R and L))        SPECIAL CARE FACTORS FREQUENCY  PT (By licensed PT), OT (By licensed OT)     PT Frequency: 5x OT Frequency: 5x            Contractures Contractures Info: Not present    Additional Factors Info  Code Status, Allergies Code Status Info: Full Code Allergies Info: Adhesive (Tape)           Current Medications (05/03/2024):  This is the current hospital active medication list Current Facility-Administered Medications  Medication Dose Route Frequency Provider Last Rate Last Admin   acetaminophen  (TYLENOL ) tablet 650 mg  650 mg Oral Q6H PRN Franky Redia SAILOR, MD   650 mg at 05/03/24 1003   Or   acetaminophen  (TYLENOL ) suppository 650 mg  650 mg Rectal Q6H PRN Franky Redia SAILOR, MD       apixaban  (ELIQUIS ) tablet 5 mg  5 mg Oral BID Franky Redia SAILOR, MD   5 mg at 05/03/24 1005   atorvastatin  (LIPITOR) tablet 20 mg  20 mg Oral q1800 Kakrakandy, Arshad N, MD   20 mg at 05/02/24 1813  flecainide (TAMBOCOR) tablet 100 mg  100 mg Oral Q12H Nishan, Peter C, MD   100 mg at 05/03/24 1005   melatonin tablet 5 mg  5 mg Oral QHS PRN Howerter, Justin B, DO       memantine  (NAMENDA ) tablet 10 mg  10 mg Oral BID Franky Redia SAILOR, MD   10 mg at 05/03/24 1005   metoprolol  tartrate (LOPRESSOR ) injection 2.5 mg  2.5 mg Intravenous Q6H PRN Ghimire, Kuber, MD   2.5 mg at 05/02/24 1942   metoprolol  tartrate (LOPRESSOR ) tablet 25 mg  25 mg Oral BID Samtani, Jai-Gurmukh, MD       OLANZapine  zydis (ZYPREXA) disintegrating tablet 5 mg  5 mg Oral Daily Samtani, Jai-Gurmukh, MD   5 mg at 05/03/24 1004   polyethylene glycol (MIRALAX / GLYCOLAX) packet 17 g  17 g Oral Daily Mansy, Jan A, MD   17 g at 05/03/24 1005   QUEtiapine (SEROQUEL) tablet 12.5 mg  12.5 mg Oral q1800 Samtani, Jai-Gurmukh, MD   12.5 mg at 05/02/24 1813     Discharge Medications: Please see discharge summary for a list of discharge medications.  Relevant Imaging Results:  Relevant Lab Results:   Additional Information SSN [id number]  Lauraine FORBES Saa, LCSWA

## 2024-05-03 NOTE — Progress Notes (Signed)
   Cardiologist:  Court  Subjective:  Restless night Wife seems overwhelmed Converted to NSR   Objective:  Vitals:   05/02/24 2000 05/02/24 2253 05/03/24 0327 05/03/24 0835  BP: 109/85 106/82 119/77 99/74  Pulse: (!) 132 87 84   Resp: 19 18 17 18   Temp: 98.6 F (37 C) 98.4 F (36.9 C) 98.6 F (37 C) 98.7 F (37.1 C)  TempSrc: Oral Oral Oral Oral  SpO2: 93% 92% 99% 97%  Weight:      Height:        Intake/Output from previous day:  Intake/Output Summary (Last 24 hours) at 05/03/2024 0908 Last data filed at 05/03/2024 0117 Gross per 24 hour  Intake 120 ml  Output 700 ml  Net -580 ml    Physical Exam: Elderly male Confused poor memory Lungs clear SEM of AV sclerosis   Abdomen benign No edema   Lab Results: Basic Metabolic Panel: Recent Labs    05/02/24 0221 05/03/24 0201  NA 141 138  K 4.2 3.8  CL 107 102  CO2 23 26  GLUCOSE 75 102*  BUN 11 12  CREATININE 0.82 1.11  CALCIUM  7.9* 8.5*    CBC: Recent Labs    05/02/24 0221 05/03/24 0201  WBC 5.0 6.3  NEUTROABS 3.2 4.2  HGB 11.4* 12.4*  HCT 35.0* 37.8*  MCV 96.2 96.2  PLT 164 181    Thyroid  Function Tests: No results for input(s): TSH, T4TOTAL, T3FREE, THYROIDAB in the last 72 hours.  Invalid input(s): FREET3   Imaging: No results found.  Cardiac Studies:  ECG: afib rate 103    Telemetry:  NSR rate 60 's   Echo: 05/12/23 EF 60-65% no LVH AV sclerosis   Medications:    apixaban   5 mg Oral BID   atorvastatin   20 mg Oral q1800   flecainide  100 mg Oral Q12H   memantine   10 mg Oral BID   metoprolol  tartrate  37.5 mg Oral BID   OLANZapine zydis  5 mg Oral Daily   polyethylene glycol  17 g Oral Daily   QUEtiapine  12.5 mg Oral q1800      lactated ringers  75 mL/hr at 05/02/24 0459    Assessment/Plan:   Afib:  second episode since 03/2023. Had missed some doses of eliquis  according to wife. On relatively high dose of lopressor . Flecainide started 04/30/24 Converted to NSR.  Check ECG for QT/QRS  Cognitive Decline:  severe worse this am from being in hospital and sleeping pill On Namenda  but not effective. Outpatient f/u [facility].   Will arrange outpatient f/u with Dr [facility]   Bryan Hart 05/03/2024, 9:08 AM

## 2024-05-03 NOTE — Discharge Summary (Signed)
 Physician Discharge Summary  Bryan Hart FMW:[id number] DOB: 1950/10/27 DOA: 04/29/2024  PCP: [name], DO  Admit date: 04/29/2024 Discharge date: 05/03/2024  Time spent: 27 minutes  Recommendations for Outpatient Follow-up:  Flecainide started this admission-outpatient careful titration/adjustment per PCP/cardiology who I have CCed on this note Will require outpatient magnesium potassium check in about 1 week Suggest referral back to Dr. [name] for discussion of other agents for dementia and progression and may need support services etc. in the outpatient setting  Discharge Diagnoses:  MAIN problem for hospitalization   Persistent A-fib/flutter on admission  Please see below for itemized issues addressed in HOpsital- refer to other progress notes for clarity if needed  Discharge Condition: Improved but overall somewhat guarded  Diet recommendation: Heart healthy  Filed Weights   04/30/24 1543  Weight: 73.6 kg    History of present illness:  73 year old male right knee repair 2023 Underlying oropharyngeal dysphagia Previously asymptomatic PVCs followed by Dr. [name] paroxysmal A-fib 04/04/2023 which spontaneously converted with IV Cardizem  and was started on Eliquis  for CHA2DS2-VASc >4-last seen by Dr. [name] 11/07/2023 Prior TIA Sleep apnea intolerant of CPAP Cognitive decline over the past year followed by Dr. [name]   Chronology  11//25 brought to ED dizziness fatigue and a fall while on Eliquis  found to be in rapid A-fib-rate slowed down with IV Lopressor  Cardiology consulted-and made recommendations      Pertinent imaging/studies till date  CXR 2 view no acute intrathoracic process CT head no acute intracranial abnormality no skull fracture      Assessment  & Plan :      Paroxysmal A-fib CHADVASC >4 currently on Eliquis  Defer DCCV per family request-reacted poorly to anesthesia for knee repair previously and they would prefer to avoid if at all  possible Per cardiology-Flecainide 100 every 12 metoprolol  37.5 twice daily, apixaban  5 twice daily-continues anticoagulation Converted to sinus rhythm 11/6 PM EKG shows QT interval/QTc 453 and cleared by cardiology to discharge on this medication Fall secondary to dizziness/potential hypotension Positive orthostatics-may preclude strict A-flutter fib control  Do not think the change from Namenda  to Aricept makes a difference-defer to neurology as outpatient Will get outpatient physical therapy referral Previous TIA probably in 2019?-1 cm infarct left motor strip Postinfarct cognitive decline History is unclear-is on atorvastatin  20 which we will continue Not really a good candidate for antiplatelet in addition to anticoagulant in the setting of fall Continues on Sleep apnea intolerant of CPAP Monitor only Previous knee repair Sees Dr. [name] of orthopedics for this and has a left knee effusion that is being managed by them Will CC him at discharge for care coordination given he is on Eliquis  which would have to be stopped 2 to 3 days prior to any procedure Multi-infarct dementia with oropharyngeal dysphagia, previous MMSE 22/30 Seems to have progressive dementia based on last note by Dr. [name]--has sometimes stopped recognizing his own wife patient  On transient Denzapine and Seroquel in the hospital and these were discontinued probably on discharge as he is able to reorient better per wife according to her in the outpatient setting    Discharge Exam: Vitals:   05/03/24 0835 05/03/24 1003  BP: 99/74 107/74  Pulse:  65  Resp: 18   Temp: 98.7 F (37.1 C)   SpO2: 97%     Subj on day of d/c   Awake coherent x 1 no distress-had a restless night Wife at bedside Complaining of some ear pain Mild cough but no wheezes looks  good overall and unchanged from prior  General Exam on discharge  EOMI NCAT no focal deficit no icterus no pallor neck soft supple Chest clear no added  sound no wheezing Abdomen soft no rebound Monitors reviewed in sinus rhythm QTc 450 Neuro intact  Discharge Instructions   Discharge Instructions     Amb Referral to AFIB Clinic   Complete by: As directed    In 1-2 weeks pls   Ambulatory referral to Physical Therapy   Complete by: As directed    Diet - low sodium heart healthy   Complete by: As directed    Discharge instructions   Complete by: As directed    Make sure that you follow-up with cardiology as per their instructions you have been started on new medication to control your heart rate called flecainide which is a good medication but may affect a certain intervals of your heart tracing-do not take any over-the-counter sleep aids Benadryl  or other medications without letting your cardiologist know It would be a good idea for you to get labs in a week at your primary physician office Please make sure that you take all your meds regularly report chest pain fever chills nausea vomiting to your primary physician Take care   Increase activity slowly   Complete by: As directed       Allergies as of 05/03/2024       Reactions   Adhesive [tape] Rash        Medication List     TAKE these medications    apixaban  5 MG Tabs tablet Commonly known as: ELIQUIS  Take 1 tablet (5 mg total) by mouth 2 (two) times daily.   atorvastatin  20 MG tablet Commonly known as: LIPITOR Take 20 mg by mouth daily.   flecainide 100 MG tablet Commonly known as: TAMBOCOR Take 1 tablet (100 mg total) by mouth every 12 (twelve) hours.   memantine  10 MG tablet Commonly known as: NAMENDA  Take 1 tablet (10 mg total) by mouth 2 (two) times daily.   Metoprolol  Tartrate 37.5 MG Tabs Take 1 tablet (37.5 mg total) by mouth 2 (two) times daily.       Allergies  Allergen Reactions   Adhesive [Tape] Rash      The results of significant diagnostics from this hospitalization (including imaging, microbiology, ancillary and laboratory) are  listed below for reference.    Significant Diagnostic Studies: DG Chest 2 View Result Date: 04/29/2024 CLINICAL DATA:  Atrial fibrillation, weakness EXAM: CHEST - 2 VIEW COMPARISON:  04/04/2023 FINDINGS: Frontal and lateral views of the chest demonstrate an unremarkable cardiac silhouette. No acute airspace disease, effusion, or pneumothorax. Stable right shoulder arthroplasty. No acute fractures. IMPRESSION: 1. No acute intrathoracic process. Electronically Signed   By: Ozell Daring M.D.   On: 04/29/2024 17:18   CT Head Wo Contrast Result Date: 04/29/2024 EXAM: CT HEAD WITHOUT CONTRAST 04/29/2024 04:27:49 PM TECHNIQUE: CT of the head was performed without the administration of intravenous contrast. Automated exposure control, iterative reconstruction, and/or weight based adjustment of the mA/kV was utilized to reduce the radiation dose to as low as reasonably achievable. COMPARISON: MRI head 06/04/2024 CLINICAL HISTORY: Head trauma, minor (Age >= 65y) FINDINGS: BRAIN AND VENTRICLES: No acute hemorrhage. No evidence of acute infarct. No hydrocephalus. No extra-axial collection. No mass effect or midline shift.ccl ORBITS: No acute abnormality. SINUSES: No acute abnormality. SOFT TISSUES AND SKULL: No acute soft tissue abnormality. No skull fracture. IMPRESSION: 1. No acute intracranial abnormality. Electronically signed by: Gilmore Molt  MD 04/29/2024 05:08 PM EST RP Workstation: [id number]    Microbiology: Recent Results (from the past 240 hours)  MRSA Next Gen by PCR, Nasal     Status: None   Collection Time: 04/30/24  5:10 PM   Specimen: Nasal Mucosa; Nasal Swab  Result Value Ref Range Status   MRSA by PCR Next Gen NOT DETECTED NOT DETECTED Final    Comment: (NOTE) The GeneXpert MRSA Assay (FDA approved for NASAL specimens only), is one component of a comprehensive MRSA colonization surveillance program. It is not intended to diagnose MRSA infection nor to guide or monitor treatment for  MRSA infections. Test performance is not FDA approved in patients less than 88 years old. Performed at [facility], [address], [place], [place] [address]      Labs: Basic Metabolic Panel: Recent Labs  Lab 04/29/24 1605 04/30/24 0327 05/02/24 0221 05/03/24 0201  NA 138 141 141 138  K 4.4 4.0 4.2 3.8  CL 103 107 107 102  CO2 25 27 23 26   GLUCOSE 85 98 75 102*  BUN 21 15 11 12   CREATININE 1.15 1.04 0.82 1.11  CALCIUM  8.7* 8.7* 7.9* 8.5*  MG  --  2.0  --   --    Liver Function Tests: No results for input(s): AST, ALT, ALKPHOS, BILITOT, PROT, ALBUMIN in the last 168 hours. No results for input(s): LIPASE, AMYLASE in the last 168 hours. No results for input(s): AMMONIA in the last 168 hours. CBC: Recent Labs  Lab 04/29/24 1605 04/30/24 0327 05/02/24 0221 05/03/24 0201  WBC 6.8 4.9 5.0 6.3  NEUTROABS  --   --  3.2 4.2  HGB 13.6 13.0 11.4* 12.4*  HCT 42.4 40.0 35.0* 37.8*  MCV 97.0 95.9 96.2 96.2  PLT 208 173 164 181   Cardiac Enzymes: No results for input(s): CKTOTAL, CKMB, CKMBINDEX, TROPONINI in the last 168 hours. BNP: BNP (last 3 results) No results for input(s): BNP in the last 8760 hours.  ProBNP (last 3 results) No results for input(s): PROBNP in the last 8760 hours.  CBG: No results for input(s): GLUCAP in the last 168 hours.  Signed:  Colen Grimes MD   [facility] 05/03/2024, 11:21 AM

## 2024-05-03 NOTE — TOC Initial Note (Signed)
 Transition of Care [facility]) - Initial/Assessment Note    Patient Details  Name: Bryan Hart MRN: [id number] Date of Birth: 1950-12-08  [facility] CM/SW Contact:    Lauraine FORBES Saa, LCSWA Phone Number: 05/03/2024, 4:09 PM  Clinical Narrative:                  4:10 PM CSW made aware of patient's spouses interest in patient discharging to SNF (per chart review, patient is not fully oriented and has a memory impairment). CSW sent patient's FL2 to SNFs in [facility]. CSW will continue to follow.  Expected Discharge Plan: Skilled Nursing Facility Barriers to Discharge: Continued Medical Work up, English As A Second Language Teacher, SNF Pending bed offer   Patient Goals and CMS Choice Patient states their goals for this hospitalization and ongoing recovery are:: return home          Expected Discharge Plan and Services In-house Referral: Clinical Social Work   Post Acute Care Choice: Skilled Nursing Facility Living arrangements for the past 2 months: Single Family Home Expected Discharge Date: 05/03/24                                    Prior Living Arrangements/Services Living arrangements for the past 2 months: Single Family Home Lives with:: Spouse Patient language and need for interpreter reviewed:: Yes        Need for Family Participation in Patient Care: Yes (Comment)   Current home services: DME Criminal Activity/Legal Involvement Pertinent to Current Situation/Hospitalization: No - Comment as needed  Activities of Daily Living   ADL Screening (condition at time of admission) Independently performs ADLs?: No Does the patient have a NEW difficulty with bathing/dressing/toileting/self-feeding that is expected to last >3 days?: No Does the patient have a NEW difficulty with getting in/out of bed, walking, or climbing stairs that is expected to last >3 days?: No Does the patient have a NEW difficulty with communication that is expected to last >3 days?:  No Is the patient deaf or have difficulty hearing?: No Does the patient have difficulty seeing, even when wearing glasses/contacts?: No Does the patient have difficulty concentrating, remembering, or making decisions?: No  Permission Sought/Granted Permission sought to share information with : Family Supports Permission granted to share information with : No (Contact information on chart)  Share Information with NAME: Elnathan Fulford     Permission granted to share info w Relationship: Spouse  Permission granted to share info w Contact Information: [phone]  Emotional Assessment   Attitude/Demeanor/Rapport: Unable to Assess Affect (typically observed): Unable to Assess Orientation: : Oriented to Self, Oriented to Place, Oriented to Situation Alcohol  / Substance Use: Not Applicable Psych Involvement: No (comment)  Admission diagnosis:  Atrial fibrillation (HCC) [I48.91] Paroxysmal atrial fibrillation (HCC) [I48.0] Atrial fibrillation with RVR (HCC) [I48.91] Fall at home, initial encounter [W19.[email or web address], Y92.009] Patient Active Problem List   Diagnosis Date Noted   Fall at home, initial encounter 04/30/2024   Atrial fibrillation (HCC) 04/29/2024   Atrial fibrillation with RVR (HCC) 04/29/2024   PAF (paroxysmal atrial fibrillation) (HCC) 04/12/2023   Hypercoagulable state due to paroxysmal atrial fibrillation (HCC) 04/12/2023   Asymptomatic PVCs 07/26/2022   Hyperlipidemia 07/26/2022   Family history of heart disease 07/26/2022   Essential hypertension 07/26/2022   Status post total right knee replacement 05/31/2022   Unilateral primary osteoarthritis, left knee 03/28/2022   Unilateral primary osteoarthritis, right knee 03/28/2022  Ceruminosis, bilateral 08/18/2021   Primary osteoarthritis of left knee 04/30/2020   Effusion, right knee 03/31/2020   Degenerative arthritis of right knee 03/31/2020   Bilateral primary osteoarthritis of knee 12/27/2018   AV block 07/29/2018    Central sleep apnea 07/29/2018   Cerebrovascular accident [facility]) 07/29/2018   Primary localized osteoarthrosis of shoulder region 01/13/2012   PCP:  [name], DO Pharmacy:   [phone] [place],  - [address] AT [address] [place] [address] Phone: [phone] Fax: [phone]     Social Drivers of Health (SDOH) Social History: SDOH Screenings   Food Insecurity: No Food Insecurity (04/30/2024)  Housing: Low Risk  (04/30/2024)  Transportation Needs: No Transportation Needs (04/30/2024)  Utilities: Not At Risk (04/30/2024)  Social Connections: Patient Declined (04/30/2024)  Tobacco Use: Medium Risk (04/29/2024)   SDOH Interventions:     Readmission Risk Interventions     No data to display

## 2024-05-03 NOTE — Plan of Care (Signed)
   Problem: Education: Goal: Knowledge of General Education information will improve Description: Including pain rating scale, medication(s)/side effects and non-pharmacologic comfort measures Outcome: Progressing   Problem: Clinical Measurements: Goal: Respiratory complications will improve Outcome: Progressing   Problem: Nutrition: Goal: Adequate nutrition will be maintained Outcome: Progressing

## 2024-05-03 NOTE — Progress Notes (Signed)
   05/03/24 1300  Orthostatic Lying   BP- Lying (!) 87/65  Pulse- Lying 56  Orthostatic Sitting  BP- Sitting (!) 89/67  Pulse- Sitting 63  Orthostatic Standing at 0 minutes  BP- Standing at 0 minutes (!) 58/47  Pulse- Standing at 0 minutes 59   Pt seen for PT.  Full note to follow.  Pt had TED hose in place on arrival. Orthostatic VS taken at ~1250.  Pt symptomatic and needed to lie back down due to dizziness.  On departure, BP was 89/67 with pt back in bed.  Pt was unable to walk with PT due to dizziness and weakness.  Wife present and states she cannot care for pt at this level.  She states he walked without any device or help prior to coming to hospital. Will message MD.  Stephane M,PT Acute Rehab Services [phone]

## 2024-05-03 NOTE — Plan of Care (Signed)
  Problem: Clinical Measurements: Goal: Will remain free from infection Outcome: Progressing   Problem: Activity: Goal: Risk for activity intolerance will decrease Outcome: Progressing   Problem: Nutrition: Goal: Adequate nutrition will be maintained Outcome: Progressing   Problem: Coping: Goal: Level of anxiety will decrease Outcome: Progressing   

## 2024-05-04 ENCOUNTER — Inpatient Hospital Stay (HOSPITAL_COMMUNITY)

## 2024-05-04 DIAGNOSIS — I6782 Cerebral ischemia: Secondary | ICD-10-CM | POA: Diagnosis not present

## 2024-05-04 DIAGNOSIS — R29818 Other symptoms and signs involving the nervous system: Secondary | ICD-10-CM | POA: Diagnosis not present

## 2024-05-04 DIAGNOSIS — I480 Paroxysmal atrial fibrillation: Secondary | ICD-10-CM | POA: Diagnosis not present

## 2024-05-04 LAB — CBC WITH DIFFERENTIAL/PLATELET
Abs Immature Granulocytes: 0.01 K/uL (ref 0.00–0.07)
Basophils Absolute: 0 K/uL (ref 0.0–0.1)
Basophils Relative: 0 %
Eosinophils Absolute: 0.2 K/uL (ref 0.0–0.5)
Eosinophils Relative: 4 %
HCT: 36.7 % — ABNORMAL LOW (ref 39.0–52.0)
Hemoglobin: 12 g/dL — ABNORMAL LOW (ref 13.0–17.0)
Immature Granulocytes: 0 %
Lymphocytes Relative: 19 %
Lymphs Abs: 1.1 K/uL (ref 0.7–4.0)
MCH: 31.7 pg (ref 26.0–34.0)
MCHC: 32.7 g/dL (ref 30.0–36.0)
MCV: 96.8 fL (ref 80.0–100.0)
Monocytes Absolute: 0.6 K/uL (ref 0.1–1.0)
Monocytes Relative: 10 %
Neutro Abs: 3.8 K/uL (ref 1.7–7.7)
Neutrophils Relative %: 67 %
Platelets: 160 K/uL (ref 150–400)
RBC: 3.79 MIL/uL — ABNORMAL LOW (ref 4.22–5.81)
RDW: 12.9 % (ref 11.5–15.5)
WBC: 5.7 K/uL (ref 4.0–10.5)
nRBC: 0 % (ref 0.0–0.2)

## 2024-05-04 LAB — BASIC METABOLIC PANEL WITH GFR
Anion gap: 11 (ref 5–15)
BUN: 16 mg/dL (ref 8–23)
CO2: 24 mmol/L (ref 22–32)
Calcium: 8.7 mg/dL — ABNORMAL LOW (ref 8.9–10.3)
Chloride: 103 mmol/L (ref 98–111)
Creatinine, Ser: 1.11 mg/dL (ref 0.61–1.24)
GFR, Estimated: >60 mL/min (ref >60)
Glucose, Bld: 84 mg/dL (ref 70–99)
Potassium: 4.1 mmol/L (ref 3.5–5.1)
Sodium: 138 mmol/L (ref 135–145)

## 2024-05-04 LAB — LACTIC ACID, PLASMA: Lactic Acid, Venous: 1 mmol/L (ref 0.5–1.9)

## 2024-05-04 MED ORDER — MEMANTINE HCL 5 MG PO TABS
5.0000 mg | ORAL_TABLET | Freq: Two times a day (BID) | ORAL | Status: DC
  Administered 2024-05-05 – 2024-05-06 (×3): 5 mg via ORAL
  Filled 2024-05-04 (×5): qty 1

## 2024-05-04 MED ORDER — SODIUM CHLORIDE 0.9 % IV BOLUS
500.0000 mL | Freq: Once | INTRAVENOUS | Status: AC
  Administered 2024-05-04: 500 mL via INTRAVENOUS

## 2024-05-04 MED ORDER — SODIUM CHLORIDE 0.9 % IV SOLN: INTRAVENOUS | Status: DC

## 2024-05-04 MED ORDER — SODIUM CHLORIDE 0.9 % IV SOLN
1.5000 g | Freq: Three times a day (TID) | INTRAVENOUS | Status: DC
  Administered 2024-05-04 – 2024-05-05 (×5): 1.5 g via INTRAVENOUS
  Filled 2024-05-04 (×7): qty 4

## 2024-05-04 NOTE — Plan of Care (Signed)
   Problem: Education: Goal: Knowledge of General Education information will improve Description Including pain rating scale, medication(s)/side effects and non-pharmacologic comfort measures Outcome: Progressing

## 2024-05-04 NOTE — Evaluation (Signed)
 Clinical/Bedside Swallow Evaluation Patient Details  Name: Bryan Hart MRN: [id number] Date of Birth: [birth year]  Today's Date: 05/04/2024 Time: SLP Start Time (ACUTE ONLY): 1510 SLP Stop Time (ACUTE ONLY): 1543 SLP Time Calculation (min) (ACUTE ONLY): 33 min  Past Medical History:  Past Medical History:  Diagnosis Date   Arthritis    HLD (hyperlipidemia)    Hypertension    dr  ed green      stress test   12 yrs ago   PVC's (premature ventricular contractions) 12 years ago   stress test done   Sleep apnea    Stroke [facility])    minor stroke 01/2018   Past Surgical History:  Past Surgical History:  Procedure Laterality Date   ANKLE ARTHROSCOPY  06/27/2008   JOINT REPLACEMENT  06/27/2009   rt shoulder rotator cuff repair   knee surgeries  8030,8006   x2   SHOULDER HEMI-ARTHROPLASTY  01/12/2012   Procedure: SHOULDER HEMI-ARTHROPLASTY;  Surgeon: Eva Elsie Herring, MD;  Location: [facility] OR;  Service: Orthopedics;  Laterality: Right;   SHOULDER SURGERY Right 07/2021   TOTAL KNEE ARTHROPLASTY Right 05/31/2022   Procedure: RIGHT TOTAL KNEE ARTHROPLASTY;  Surgeon: [name] Lonni GRADE, MD;  Location: [facility] OR;  Service: Orthopedics;  Laterality: Right;   HPI:  Pt is a 73 yo M who presented to ED 04/29/24 in Afib with hypotension and fall. CXR (05/04/24) revealed, Scattered airspace opacities in the right lung, suspicious for pneumonia. MBSS (03/14/24) noted, pharyngeal residue cleared better with second swallow, :he does not penetrate thin liquids via cup until consumed with the barium tablet, and a single instance of trace aspiration via straw... with pt clearing his throat to eject aspirates (PAS 6). Note that MBSS found that NTL CONTACTS cords and was not ejected out. OP SLP services from 10/2023- 03/2024 for dysphagia and voice therapy. PMhx: CVA, HLD, HTN, AFib, AV block, Rt TKA, dementia, Rt shoulder hemiarthroplasty, pharyngoesophageal dysphagia.    Assessment / Plan /  Recommendation  Clinical Impression  Pt, with chronic h/o pharyngoesophageal dysphagia, presents this date with symptoms consistent with this dx, along with some oral deficits likely impacted by reduced mentation and overall lethargy at time of eval. Pt wife, in room, reports that she assisted him with afternoon meal stating that he was participative, opening mouth for POs and with minimal-no coughing. RN reports pt taking pills whole with water without difficulty as well, though with some protest. During eval, pt with difficulty following directions likely due to baseline dementia and acute changes in mentation. Congested coughing observed pior to POs. He required full assist for bites/sips and intermittent verbal/tactile stimulation cues to remain alert. Delayed coughing exhibited x1 with sip of thin by cup and oral holding noted with tsp puree but with cues was ultimately cleared.       Given clinical presentation this date in conjunction with most recent MBSS results (see HPI), recommend continue dys 1 (puree) diet with thin liquids with adherence to swallow and aspiration precautions (sit pt upright, small bites/sips from cup or straw, slow rate, ensure pt is alert). Suspect fluctations in mentation will continue to occur and he will need 1:1 supervision/assist for meals with those assisting adhereing to swallow precautions/strategies. Please message SLP team if s/sx of aspiration increase, but plan is to f/u early next week for diet tolerance and ongoing training of caregiver/wife in precautions/strategies. Discussed recommendations and pt's h/o dysphagia extensively with wife and she agrees with all recommendations this date.  SLP Visit Diagnosis:  Dysphagia, pharyngoesophageal phase (R13.14)    Aspiration Risk       Diet Recommendation Dysphagia 1 (Puree);Thin liquid    Liquid Administration via: Cup;Straw Medication Administration: Whole meds with puree Supervision: Staff to assist with self  feeding;Full supervision/cueing for compensatory strategies Compensations: Minimize environmental distractions;Slow rate;Small sips/bites;Multiple dry swallows after each bite/sip Postural Changes: Seated upright at 90 degrees    Other  Recommendations Oral Care Recommendations: Oral care BID     Assistance Recommended at Discharge    Functional Status Assessment Patient has had a recent decline in their functional status and demonstrates the ability to make significant improvements in function in a reasonable and predictable amount of time.  Frequency and Duration min 2x/week  2 weeks       Prognosis Prognosis for improved oropharyngeal function: Fair Barriers to Reach Goals: Time post onset;Severity of deficits;Cognitive deficits      Swallow Study   General Date of Onset: 05/04/24 HPI: Pt is a 73 yo M who presented to ED 04/29/24 in Afib with hypotension and fall. CXR (05/04/24) revealed, Scattered airspace opacities in the right lung, suspicious for pneumonia. MBSS (03/14/24) noted, pharyngeal residue cleared better with second swallow, :he does not penetrate thin liquids via cup until consumed with the barium tablet, and a single instance of trace aspiration via straw... with pt clearing his throat to eject aspirates (PAS 6). Note that MBSS found that NTL CONTACTS cords and was not ejected out. OP SLP services from 10/2023- 03/2024 for dysphagia and voice therapy. PMhx: CVA, HLD, HTN, AFib, AV block, Rt TKA, dementia, Rt shoulder hemiarthroplasty, pharyngoesophageal dysphagia. Type of Study: Bedside Swallow Evaluation Previous Swallow Assessment: see HPI Diet Prior to this Study: Dysphagia 1 (pureed);Thin liquids (Level 0) Temperature Spikes Noted: Yes (101.6) Respiratory Status: Room air History of Recent Intubation: No Behavior/Cognition: Confused;Pleasant mood;Requires cueing;Lethargic/Drowsy Oral Cavity Assessment:  (difficult to assess) Oral Care Completed by SLP: No Oral  Cavity - Dentition: Adequate natural dentition Vision:  (difficult to assess) Self-Feeding Abilities: Total assist Patient Positioning: Upright in bed;Postural control interferes with function Baseline Vocal Quality: Low vocal intensity;Breathy Volitional Cough: Congested Volitional Swallow: Able to elicit    Oral/Motor/Sensory Function Overall Oral Motor/Sensory Function: Generalized oral weakness   Ice Chips Ice chips: Within functional limits Presentation: Spoon   Thin Liquid Thin Liquid: Impaired Presentation: Cup;Straw Oral Phase Impairments: Poor awareness of bolus Pharyngeal  Phase Impairments: Suspected delayed Swallow;Cough - Delayed    Nectar Thick Nectar Thick Liquid: Not tested   Honey Thick Honey Thick Liquid: Not tested   Puree Puree: Impaired Presentation: Spoon Oral Phase Impairments: Poor awareness of bolus;Reduced lingual movement/coordination Oral Phase Functional Implications: Prolonged oral transit;Oral holding   Solid     Solid: Not tested       Wilder Kin, MA, CCC-SLP Acute Rehabilitation Services Office Number: [phone]  Wilder KANDICE Kin 05/04/2024,4:22 PM

## 2024-05-04 NOTE — Progress Notes (Signed)
 [facility]   ROUNDING   NOTE KENDELL SAGRAVES FMW:[id number]  DOB: [birth year]  DOA: 04/29/2024  PCP: [name], DO  05/04/2024,2:33 PM  LOS: 3 days    Code Status: Full code     from: Home  73 year old male right knee repair 2023 Underlying oropharyngeal dysphagia Previously asymptomatic PVCs followed by Dr. [name] paroxysmal A-fib 04/04/2023 which spontaneously converted with IV Cardizem  and was started on Eliquis  for CHA2DS2-VASc >4-last seen by Dr. [name] 11/07/2023 Prior TIA Sleep apnea intolerant of CPAP Cognitive decline over the past year followed by Dr. [name]  Chronology  11//25 brought to ED dizziness fatigue and a fall while on Eliquis  found to be in rapid A-fib-rate slowed down with IV Lopressor  Cardiology consulted-planning watchful waiting?  Flecainide?  TEE DCCV    Pertinent imaging/studies till date  CXR 2 view no acute intrathoracic process CT head no acute intracranial abnormality no skull fracture CXR 11/8 right-sided pneumonia    Assessment  & Plan :    Right-sided pneumonia probably secondary to oropharyngeal dysphagia from his dementia Start Unasyn, blood culture X2 downgrade to dysphagia 1 diet and get speech to see him in the morning If unable to take p.o. safely will need to convert probably to either Amio or Cardizem  gtt if he goes back into A-fib See below Paroxysmal A-fib CHADVASC >4 currently on Eliquis  Defer DCCV per family request-reacted poorly to anesthesia for knee repair previously and they would prefer to avoid if at all possible Per cardiology-Flecainide 100 every 12 --- he was dizzy so we cut him back to metoprolol  25 twice daily, continues apixaban  5 twice daily Fall secondary to dizziness/potential hypotension Do not think the change from Namenda  to Aricept makes a difference-defer to neurology as outpatient Medications adjusted as above Orthostatic vital signs 11/7 with blood pressures in the 50s to 60s systolic on standing and he was unable  to walk with therapy He will need skilled placement eventually Previous TIA probably in 2019?-1 cm infarct left motor strip Postinfarct cognitive decline History is unclear-is on atorvastatin  20 which we will continue Continues anticoagulation no role for antiplatelet Sleep apnea intolerant of CPAP Monitor only Previous knee repair Sees Dr. [name] of orthopedics for this and has a left knee effusion that is being managed by them Will CC him at discharge for care coordination given he is on Eliquis  which would have to be stopped 2 to 3 days prior to any procedure Multi-infarct dementia with oropharyngeal dysphagia, previous MMSE 22/30 Seems to have progressive dementia based on last note by Dr. Donnise sometimes stopped recognizing his own wife Zydus 5 mg every afternoon, will schedule Seroquel for around 1800 every day he is intermittently confused and agitated  Data Reviewed today:   Sodium 138 potassium 4.1 BUN/creatinine 16/1.1 WBC 5.7 hemoglobin 12.0 platelet 160 I looked at the x-ray myself   DVT prophylaxis: Eliquis   Status is: Observation The patient will require care spanning > 2 midnights and should be moved to inpatient because:       Dispo/Global plan: Now has pneumonia on top of his other issues and we will see how he does over the next day I had a very clear discussion with his wife about his decline and we talked about CODE STATUS he is a full code would want an attempt at resuscitation I did mention that aspiration pneumonia is the biggest concern we have in patients with dementia which can cause significant morbidity mortality She understands and will be informing her children  as well   Time 40   Subjective:   Nursing texted about patient being shaky-at bedside he is warm to touch and much more encephalopathic than previous he was quite redirectable yesterday He is not talking clearly since yesterday evening I shared with his wife the above  discussion Nursing is aware  Objective + exam Vitals:   05/04/24 0828 05/04/24 1032 05/04/24 1246 05/04/24 1339  BP: 101/85 120/86    Pulse: 64 61 65   Resp: 19  19   Temp: 98.3 F (36.8 C)  100.3 F (37.9 C) 98.6 F (37 C)  TempSrc: Axillary  Oral   SpO2: 94%     Weight:      Height:       Filed Weights   04/30/24 1543  Weight: 73.6 kg     Examination: EOMI NCAT no icterus no pallor Neck soft supple Tachycardic at times but predominantly now in sinus Decreased air entry but cannot appreciate rales Abdomen soft Hands are shaky patient is not really following commands as well as seated but he is talking intelligibly to his wife  Scheduled Meds:  apixaban   5 mg Oral BID   atorvastatin   20 mg Oral q1800   flecainide  100 mg Oral Q12H   memantine   10 mg Oral BID   metoprolol  tartrate  25 mg Oral BID   OLANZapine zydis  5 mg Oral Daily   polyethylene glycol  17 g Oral Daily   QUEtiapine  12.5 mg Oral q1800   Continuous Infusions:  ampicillin-sulbactam (UNASYN) IV     acetaminophen  **OR** acetaminophen , melatonin, metoprolol  tartrate  Jai-Gurmukh Tanishka Drolet, MD  [facility]

## 2024-05-05 DIAGNOSIS — I480 Paroxysmal atrial fibrillation: Secondary | ICD-10-CM | POA: Diagnosis not present

## 2024-05-05 LAB — COMPREHENSIVE METABOLIC PANEL WITH GFR
ALT: 14 U/L (ref 0–44)
AST: 15 U/L (ref 15–41)
Albumin: 2.5 g/dL — ABNORMAL LOW (ref 3.5–5.0)
Alkaline Phosphatase: 59 U/L (ref 38–126)
Anion gap: 10 (ref 5–15)
BUN: 13 mg/dL (ref 8–23)
CO2: 24 mmol/L (ref 22–32)
Calcium: 8.1 mg/dL — ABNORMAL LOW (ref 8.9–10.3)
Chloride: 105 mmol/L (ref 98–111)
Creatinine, Ser: 0.99 mg/dL (ref 0.61–1.24)
GFR, Estimated: >60 mL/min (ref >60)
Glucose, Bld: 88 mg/dL (ref 70–99)
Potassium: 4 mmol/L (ref 3.5–5.1)
Sodium: 139 mmol/L (ref 135–145)
Total Bilirubin: 1.7 mg/dL — ABNORMAL HIGH (ref 0.0–1.2)
Total Protein: 5.1 g/dL — ABNORMAL LOW (ref 6.5–8.1)

## 2024-05-05 LAB — CBC WITH DIFFERENTIAL/PLATELET
Abs Immature Granulocytes: 0.03 K/uL (ref 0.00–0.07)
Basophils Absolute: 0 K/uL (ref 0.0–0.1)
Basophils Relative: 0 %
Eosinophils Absolute: 0.1 K/uL (ref 0.0–0.5)
Eosinophils Relative: 1 %
HCT: 33.3 % — ABNORMAL LOW (ref 39.0–52.0)
Hemoglobin: 10.9 g/dL — ABNORMAL LOW (ref 13.0–17.0)
Immature Granulocytes: 0 %
Lymphocytes Relative: 11 %
Lymphs Abs: 0.9 K/uL (ref 0.7–4.0)
MCH: 31.4 pg (ref 26.0–34.0)
MCHC: 32.7 g/dL (ref 30.0–36.0)
MCV: 96 fL (ref 80.0–100.0)
Monocytes Absolute: 0.6 K/uL (ref 0.1–1.0)
Monocytes Relative: 8 %
Neutro Abs: 6.6 K/uL (ref 1.7–7.7)
Neutrophils Relative %: 80 %
Platelets: 163 K/uL (ref 150–400)
RBC: 3.47 MIL/uL — ABNORMAL LOW (ref 4.22–5.81)
RDW: 12.8 % (ref 11.5–15.5)
WBC: 8.3 K/uL (ref 4.0–10.5)
nRBC: 0 % (ref 0.0–0.2)

## 2024-05-05 NOTE — Progress Notes (Signed)
 [facility]   ROUNDING   NOTE FALCON MCCASKEY FMW:[id number]  DOB: [birth year]  DOA: 04/29/2024  PCP: [name], DO  05/05/2024,3:36 PM  LOS: 4 days    Code Status: Full code     from: Home  73 year old male right knee repair 2023 Underlying oropharyngeal dysphagia Previously asymptomatic PVCs followed by Dr. [name] paroxysmal A-fib 04/04/2023 which spontaneously converted with IV Cardizem  and was started on Eliquis  for CHA2DS2-VASc >4-last seen by Dr. [name] 11/07/2023 Prior TIA Sleep apnea intolerant of CPAP Cognitive decline over the past year followed by Dr. [name]  Chronology  11//25 brought to ED dizziness fatigue and a fall while on Eliquis  found to be in rapid A-fib-rate slowed down with IV Lopressor  Cardiology consulted-planning watchful waiting?  Flecainide?  TEE DCCV    Pertinent imaging/studies till date  CXR 2 view no acute intrathoracic process CT head no acute intracranial abnormality no skull fracture CXR 11/8 right-sided pneumonia 11/8 CT head no evidence of acute intracranial abnormality moderate chronic microvascular changes   Assessment  & Plan :    Right-sided pneumonia probably secondary to oropharyngeal dysphagia from his dementia Continues Unasyn-BCx is still pending-duration of treatment total probably 7 days Dysphagia 1 diet until speech therapy can reeval in the morning-switch to oral antibiotics once cleared for increasing diet Received IV fluid overnight 811/8 mL currently saline lock Paroxysmal A-fib CHADVASC >4 currently on Eliquis  Defer DCCV per family request-reacted poorly to anesthesia for knee repair previously and they would prefer to avoid if at all possible Per cardiology-Flecainide 100 every 12 --- 2/2 orthostasis changed to metoprolol  25 twice daily, continues apixaban  5 twice daily Fall secondary to dizziness/potential hypotension Do not think the change from Namenda  to Aricept makes a difference-defer to neurology as outpatient He will need  skilled placement eventually Previous TIA probably in 2019?-1 cm infarct left motor strip Postinfarct cognitive decline Continues atorvastatin  20 which we will continue Continues anticoagulation no role for antiplatelet in the setting of falls Sleep apnea intolerant of CPAP Monitor only Previous knee repair Sees Dr. [name] of orthopedics for this and has a left knee effusion that is being managed by them Will CC him at discharge for care coordination given he is on Eliquis  which would have to be stopped 2 to 3 days prior to any procedure Multi-infarct dementia with oropharyngeal dysphagia, previous MMSE 22/30 Metabolic encephalopathy secondary to pneumonia as above Seems to have progressive dementia based on last note by Dr. Donnise sometimes stopped recognizing his own wife Cut back some of his anxiolytics and meds for delirium stopped Seroquel stopped Zydus 11/8 given encephalopathy-have changed him to Namenda  5 twice daily  Data Reviewed today:   Sodium 139 potassium 4.0 BUN/creatinine 13/0.9 WBC 8.3 hemoglobin 10.9 platelet 163  DVT prophylaxis: Eliquis   Status is: Observation The patient will require care spanning > 2 midnights and should be moved to inpatient because:    Dispo/Global plan: Now has pneumonia on top of his other issues  Transition to oral meds when able  Will need skilled in the next several days once he shows documented sustained improvement  Time 40   Subjective:   Seems much improved and closer to his normal He is a lot more clear I think he had some encephalopathy from the illness   Objective + exam Vitals:   05/05/24 0750 05/05/24 0900 05/05/24 1052 05/05/24 1530  BP: 132/80 119/75 104/75 122/76  Pulse: 69 61 67 60  Resp: 17 12 20 18   Temp: 98.2 F (  36.8 C)  98 F (36.7 C) 97.6 F (36.4 C)  TempSrc: Oral  Oral Oral  SpO2: 96% 97% 97% 97%  Weight:      Height:       Filed Weights   04/30/24 1543  Weight: 73.6 kg      Examination: EOMI NCAT no icterus no pallor Neck soft supple Sinus rhythm sinus bradycardia Abdomen soft no rebound No lower extremity edema  Scheduled Meds:  apixaban   5 mg Oral BID   flecainide  100 mg Oral Q12H   memantine   5 mg Oral BID   metoprolol  tartrate  25 mg Oral BID   polyethylene glycol  17 g Oral Daily   Continuous Infusions:  ampicillin-sulbactam (UNASYN) IV 1.5 g (05/05/24 1450)   acetaminophen  **OR** acetaminophen , metoprolol  tartrate  Jai-Gurmukh Jakarius Flamenco, MD  [facility]

## 2024-05-05 NOTE — Plan of Care (Signed)
   Problem: Health Behavior/Discharge Planning: Goal: Ability to manage health-related needs will improve Outcome: Progressing   Problem: Clinical Measurements: Goal: Ability to maintain clinical measurements within normal limits will improve Outcome: Progressing   Problem: Clinical Measurements: Goal: Will remain free from infection Outcome: Progressing

## 2024-05-05 NOTE — Progress Notes (Signed)
 Pt ambulated x2 ~250 ft in the hall with the walker, sat up  in the chair for his meals. Had a large bowel movement this afternoon. Back in the bed with callbell within reach. Wife and son at the bedside.

## 2024-05-06 ENCOUNTER — Other Ambulatory Visit: Payer: Self-pay | Admitting: Family Medicine

## 2024-05-06 DIAGNOSIS — I480 Paroxysmal atrial fibrillation: Secondary | ICD-10-CM | POA: Diagnosis not present

## 2024-05-06 LAB — CBC WITH DIFFERENTIAL/PLATELET
Abs Immature Granulocytes: 0.02 K/uL (ref 0.00–0.07)
Basophils Absolute: 0 K/uL (ref 0.0–0.1)
Basophils Relative: 1 %
Eosinophils Absolute: 0.2 K/uL (ref 0.0–0.5)
Eosinophils Relative: 3 %
HCT: 34.7 % — ABNORMAL LOW (ref 39.0–52.0)
Hemoglobin: 11.5 g/dL — ABNORMAL LOW (ref 13.0–17.0)
Immature Granulocytes: 0 %
Lymphocytes Relative: 13 %
Lymphs Abs: 0.8 K/uL (ref 0.7–4.0)
MCH: 31.4 pg (ref 26.0–34.0)
MCHC: 33.1 g/dL (ref 30.0–36.0)
MCV: 94.8 fL (ref 80.0–100.0)
Monocytes Absolute: 0.7 K/uL (ref 0.1–1.0)
Monocytes Relative: 11 %
Neutro Abs: 4.1 K/uL (ref 1.7–7.7)
Neutrophils Relative %: 72 %
Platelets: 166 K/uL (ref 150–400)
RBC: 3.66 MIL/uL — ABNORMAL LOW (ref 4.22–5.81)
RDW: 12.5 % (ref 11.5–15.5)
WBC: 5.8 K/uL (ref 4.0–10.5)
nRBC: 0 % (ref 0.0–0.2)

## 2024-05-06 LAB — COMPREHENSIVE METABOLIC PANEL WITH GFR
ALT: 14 U/L (ref 0–44)
AST: 17 U/L (ref 15–41)
Albumin: 2.6 g/dL — ABNORMAL LOW (ref 3.5–5.0)
Alkaline Phosphatase: 59 U/L (ref 38–126)
Anion gap: 12 (ref 5–15)
BUN: 16 mg/dL (ref 8–23)
CO2: 22 mmol/L (ref 22–32)
Calcium: 8.5 mg/dL — ABNORMAL LOW (ref 8.9–10.3)
Chloride: 107 mmol/L (ref 98–111)
Creatinine, Ser: 1.01 mg/dL (ref 0.61–1.24)
GFR, Estimated: >60 mL/min (ref >60)
Glucose, Bld: 95 mg/dL (ref 70–99)
Potassium: 4 mmol/L (ref 3.5–5.1)
Sodium: 141 mmol/L (ref 135–145)
Total Bilirubin: 0.9 mg/dL (ref 0.0–1.2)
Total Protein: 5.5 g/dL — ABNORMAL LOW (ref 6.5–8.1)

## 2024-05-06 MED ORDER — METOPROLOL TARTRATE 25 MG PO TABS: 25.0000 mg | ORAL_TABLET | Freq: Two times a day (BID) | ORAL | Status: DC

## 2024-05-06 MED ORDER — AMOXICILLIN-POT CLAVULANATE 875-125 MG PO TABS: 1.0000 | ORAL_TABLET | Freq: Two times a day (BID) | ORAL | 0 refills | Status: AC

## 2024-05-06 MED ORDER — METOPROLOL TARTRATE 25 MG PO TABS: 25.0000 mg | ORAL_TABLET | Freq: Two times a day (BID) | ORAL | 3 refills | Status: DC

## 2024-05-06 MED ORDER — AMOXICILLIN-POT CLAVULANATE 875-125 MG PO TABS
1.0000 | ORAL_TABLET | Freq: Two times a day (BID) | ORAL | Status: DC
  Administered 2024-05-06: 1 via ORAL
  Filled 2024-05-06: qty 1

## 2024-05-06 NOTE — Plan of Care (Signed)
   Problem: Education: Goal: Knowledge of General Education information will improve Description: Including pain rating scale, medication(s)/side effects and non-pharmacologic comfort measures Outcome: Progressing   Problem: Activity: Goal: Risk for activity intolerance will decrease Outcome: Progressing

## 2024-05-06 NOTE — Discharge Summary (Signed)
 Physician Discharge Summary  Bryan Hart FMW:[id number] DOB: 10/16/1950 DOA: 04/29/2024  PCP: [name], DO  Admit date: 04/29/2024 Discharge date: 05/06/2024  Time spent: 45 minutes  Recommendations for Outpatient Follow-up:  Chem-7 CBC 1 week in addition to magnesium Get chest x-ray in 2 weeks to denote clearing of the x-ray and complete oral antibiotics as per orders Suggest close follow-up with atrial fibrillation clinic as per their recommendations Suggest follow-up with neurology as well  Discharge Diagnoses:  MAIN problem for hospitalization   Atrial flutter A-fib now placed on flecainide Aspiration pneumonia Dementia slightly progressive  Please see below for itemized issues addressed in HOpsital- refer to other progress notes for clarity if needed  Discharge Condition: Improved  Diet recommendation: Heart healthy  Filed Weights   04/30/24 1543  Weight: 77.40 kg    73 year old male right knee repair 2023 Underlying oropharyngeal dysphagia Previously asymptomatic PVCs followed by Dr. [name] paroxysmal A-fib 04/04/2023 which spontaneously converted with IV Cardizem  and was started on Eliquis  for CHA2DS2-VASc >4-last seen by Dr. [name] 11/07/2023 Prior TIA Sleep apnea intolerant of CPAP Cognitive decline over the past year followed by Dr. [name]   Chronology  11//25 brought to ED dizziness fatigue and a fall while on Eliquis  found to be in rapid A-fib-rate slowed down with IV Lopressor  Cardiology consulted-planning watchful waiting?  Flecainide?  TEE DCCV      Pertinent imaging/studies till date  CXR 2 view no acute intrathoracic process CT head no acute intracranial abnormality no skull fracture CXR 11/8 right-sided pneumonia 11/8 CT head no evidence of acute intracranial abnormality moderate chronic microvascular changes    Assessment  & Plan :      Right-sided pneumonia probably secondary to oropharyngeal dysphagia from his dementia Patient  became lethargic and altered on 11/8 and although he did not have a fever x-ray showed probable aspiration on the right side so patient was started on Unasyn cultures ultimately were negative speech saw him downgraded him but then reevaluated him and felt he was okay for regular diet He will complete a total of 7 days of Augmentin going forward which has been called into his pharmacy and will require x-ray in 2 to 3 weeks to denote clearing He is closer to his baseline Paroxysmal A-fib CHADVASC >4 currently on Eliquis  Defer DCCV per family request-reacted poorly to anesthesia for knee repair previously and they would prefer to avoid if at all possible Per cardiology-Flecainide 100 every 12 --- 2/2 orthostasis changed to metoprolol  25 twice daily, continues apixaban  5 twice daily Fall secondary to dizziness/potential hypotension Do not think the change from Namenda  to Aricept makes a difference-defer to neurology as outpatient He will need skilled placement eventually Previous TIA probably in 2019?-1 cm infarct left motor strip Postinfarct cognitive decline Continues atorvastatin  20 which we will continue Continues anticoagulation no role for antiplatelet in the setting of falls Sleep apnea intolerant of CPAP Monitor only Previous knee repair Sees Dr. [name] of orthopedics for this and has a left knee effusion that is being managed by them Will CC him at discharge for care coordination given he is on Eliquis  which would have to be stopped 2 to 3 days prior to any procedure Multi-infarct dementia with oropharyngeal dysphagia, previous MMSE 22/30 Metabolic encephalopathy secondary to pneumonia as above Seems to have progressive dementia Dr. [name] to see in the outpatient setting Had some mild hospital induced delirium stopped Seroquel stopped Zydus 11/8 given encephalopathy-I increased him back to Namenda  10 at discharge which  is his baseline medication  Discharge Exam: Vitals:   05/06/24  0801 05/06/24 1058  BP: (!) 173/91 125/79  Pulse: (!) 56 63  Resp: 15   Temp: 97.7 F (36.5 C)   SpO2:      Subj on day of d/c   Overall much better did not sleep well overnight no chest pain no fever no chills no nausea no vomiting Walked with therapy in the hallway yesterday and today did fairly well Only has 1 step at home so wife is preferring to take him home  General Exam on discharge  EOMI NCAT no focal deficit Chest clear Sinus rhythm some bradycardia Abdomen soft Chest clear Neuro intact  Discharge Instructions   Discharge Instructions     Amb Referral to AFIB Clinic   Complete by: As directed    In 1-2 weeks pls   Ambulatory referral to Physical Therapy   Complete by: As directed    Diet - low sodium heart healthy   Complete by: As directed    Discharge instructions   Complete by: As directed    You were diagnosed with pneumonia this hospital stay after being found to be in A-fib and you will need to   Discharge instructions   Complete by: As directed    Your diagnosis hospitalization with new onset atrial fibrillation and was started on various new meds including flecainide which you need to take as scheduled and should follow-up with the A-fib clinic You also were diagnosed with pneumonia earlier in the hospital stay requiring antibiotics initially IV and will require completion of the antibiotics we have prescribed Please get labs in about a week Please make sure that you get the home health that you need follow-up with your primary doctor and with the A-fib clinic Should you have high heart rates above 100, high-grade fever or severe cough worse than you had prior please present your back for evaluation Please see your primary physician in a week   Increase activity slowly   Complete by: As directed    Increase activity slowly   Complete by: As directed       Allergies as of 05/06/2024       Reactions   Adhesive [tape] Rash        Medication  List     TAKE these medications    amoxicillin-clavulanate 875-125 MG tablet Commonly known as: AUGMENTIN Take 1 tablet by mouth every 12 (twelve) hours for 4 days.   apixaban  5 MG Tabs tablet Commonly known as: ELIQUIS  Take 1 tablet (5 mg total) by mouth 2 (two) times daily.   atorvastatin  20 MG tablet Commonly known as: LIPITOR Take 20 mg by mouth daily.   flecainide 100 MG tablet Commonly known as: TAMBOCOR Take 1 tablet (100 mg total) by mouth every 12 (twelve) hours.   memantine  10 MG tablet Commonly known as: NAMENDA  Take 1 tablet (10 mg total) by mouth 2 (two) times daily.   metoprolol  tartrate 25 MG tablet Commonly known as: LOPRESSOR  Take 1 tablet (25 mg total) by mouth 2 (two) times daily. What changed:  medication strength how much to take       Allergies  Allergen Reactions   Adhesive [Tape] Rash    Follow-up Information     [name], DO Follow up in 1 week(s).   Specialty: Internal Medicine Why: Hospital follow up Contact information: [address] [place] [address] [phone]  The results of significant diagnostics from this hospitalization (including imaging, microbiology, ancillary and laboratory) are listed below for reference.    Significant Diagnostic Studies: CT HEAD WO CONTRAST ( ) Result Date: 05/05/2024 EXAM: CT HEAD WITHOUT CONTRAST 05/04/2024 11:58:00 PM TECHNIQUE: CT of the head was performed without the administration of intravenous contrast. Automated exposure control, iterative reconstruction, and/or weight based adjustment of the mA/kV was utilized to reduce the radiation dose to as low as reasonably achievable. COMPARISON: CT head 04/29/2024 CLINICAL HISTORY: Neuro deficit, acute, stroke suspected. FINDINGS: BRAIN AND VENTRICLES: No acute hemorrhage. No evidence of acute large vascular territory infarct. Patchy white matter hyperintensities, nonspecific but compatible with chronic microvascular  ischemic disease. No hydrocephalus. No extra-axial collection. No mass effect or midline shift. ORBITS: No acute abnormality. SINUSES: No acute abnormality. SOFT TISSUES AND SKULL: No skull fracture. IMPRESSION: 1. No evidence of acute intracranial abnormality. An MRI can provide more sensitive evaluation for acute infarct if clinically warranted. 2. Moderate chronic microvascular ischemic change. Electronically signed by: Gilmore Molt MD 05/05/2024 01:22 AM EST RP Workstation: [id number]   DG CHEST PORT 1 VIEW Result Date: 05/04/2024 CLINICAL DATA:  Pneumonia, hypotension. EXAM: PORTABLE CHEST 1 VIEW COMPARISON:  04/29/2024. FINDINGS: . The heart is enlarged and the mediastinal contour is within normal limits. There is atherosclerotic calcification of the aorta. Scattered airspace opacities are noted in the right lung. No effusion or pneumothorax is seen. The left lung is clear. Right shoulder arthroplasty changes are present. No acute osseous abnormality. IMPRESSION: Scattered airspace opacities in the right lung, suspicious for pneumonia. Electronically Signed   By: Leita Birmingham M.D.   On: 05/04/2024 14:20   DG Chest 2 View Result Date: 04/29/2024 CLINICAL DATA:  Atrial fibrillation, weakness EXAM: CHEST - 2 VIEW COMPARISON:  04/04/2023 FINDINGS: Frontal and lateral views of the chest demonstrate an unremarkable cardiac silhouette. No acute airspace disease, effusion, or pneumothorax. Stable right shoulder arthroplasty. No acute fractures. IMPRESSION: 1. No acute intrathoracic process. Electronically Signed   By: Ozell Daring M.D.   On: 04/29/2024 17:18   CT Head Wo Contrast Result Date: 04/29/2024 EXAM: CT HEAD WITHOUT CONTRAST 04/29/2024 04:27:49 PM TECHNIQUE: CT of the head was performed without the administration of intravenous contrast. Automated exposure control, iterative reconstruction, and/or weight based adjustment of the mA/kV was utilized to reduce the radiation dose to as low as  reasonably achievable. COMPARISON: MRI head 06/04/2024 CLINICAL HISTORY: Head trauma, minor (Age >= 65y) FINDINGS: BRAIN AND VENTRICLES: No acute hemorrhage. No evidence of acute infarct. No hydrocephalus. No extra-axial collection. No mass effect or midline shift.ccl ORBITS: No acute abnormality. SINUSES: No acute abnormality. SOFT TISSUES AND SKULL: No acute soft tissue abnormality. No skull fracture. IMPRESSION: 1. No acute intracranial abnormality. Electronically signed by: Gilmore Molt MD 04/29/2024 05:08 PM EST RP Workstation: [id number]    Microbiology: Recent Results (from the past 240 hours)  MRSA Next Gen by PCR, Nasal     Status: None   Collection Time: 04/30/24  5:10 PM   Specimen: Nasal Mucosa; Nasal Swab  Result Value Ref Range Status   MRSA by PCR Next Gen NOT DETECTED NOT DETECTED Final    Comment: (NOTE) The GeneXpert MRSA Assay (FDA approved for NASAL specimens only), is one component of a comprehensive MRSA colonization surveillance program. It is not intended to diagnose MRSA infection nor to guide or monitor treatment for MRSA infections. Test performance is not FDA approved in patients less than 5 years old. Performed at [facility]  Lab, [address] [place], [place] [address]   Culture, blood (Routine X 2) w Reflex to ID Panel     Status: None (Preliminary result)   Collection Time: 05/04/24  3:05 PM   Specimen: BLOOD LEFT HAND  Result Value Ref Range Status   Specimen Description BLOOD LEFT HAND  Final   Special Requests   Final    BOTTLES DRAWN AEROBIC AND ANAEROBIC Blood Culture results may not be optimal due to an inadequate volume of blood received in culture bottles   Culture   Final    NO GROWTH 2 DAYS Performed at [facility], [address] [place], [place] [address]    Report Status PENDING  Incomplete  Culture, blood (Routine X 2) w Reflex to ID Panel     Status: None (Preliminary result)   Collection Time: 05/04/24  3:10 PM    Specimen: BLOOD LEFT ARM  Result Value Ref Range Status   Specimen Description BLOOD LEFT ARM  Final   Special Requests   Final    BOTTLES DRAWN AEROBIC AND ANAEROBIC Blood Culture results may not be optimal due to an inadequate volume of blood received in culture bottles   Culture   Final    NO GROWTH 2 DAYS Performed at [facility], [address] [place], [place] [address]    Report Status PENDING  Incomplete     Labs: Basic Metabolic Panel: Recent Labs  Lab 04/30/24 0327 05/02/24 0221 05/03/24 0201 05/04/24 0318 05/05/24 0335 05/06/24 0410  NA 141 141 138 138 139 141  K 4.0 4.2 3.8 4.1 4.0 4.0  CL 107 107 102 103 105 107  CO2 27 23 26 24 24 22   GLUCOSE 98 75 102* 84 88 95  BUN 15 11 12 16 13 16   CREATININE 1.04 0.82 1.11 1.11 0.99 1.01  CALCIUM  8.7* 7.9* 8.5* 8.7* 8.1* 8.5*  MG 2.0  --   --   --   --   --    Liver Function Tests: Recent Labs  Lab 05/05/24 0335 05/06/24 0410  AST 15 17  ALT 14 14  ALKPHOS 59 59  BILITOT 1.7* 0.9  PROT 5.1* 5.5*  ALBUMIN 2.5* 2.6*   No results for input(s): LIPASE, AMYLASE in the last 168 hours. No results for input(s): AMMONIA in the last 168 hours. CBC: Recent Labs  Lab 05/02/24 0221 05/03/24 0201 05/04/24 0318 05/05/24 0335 05/06/24 0410  WBC 5.0 6.3 5.7 8.3 5.8  NEUTROABS 3.2 4.2 3.8 6.6 4.1  HGB 11.4* 12.4* 12.0* 10.9* 11.5*  HCT 35.0* 37.8* 36.7* 33.3* 34.7*  MCV 96.2 96.2 96.8 96.0 94.8  PLT 164 181 160 163 166   Cardiac Enzymes: No results for input(s): CKTOTAL, CKMB, CKMBINDEX, TROPONINI in the last 168 hours. BNP: BNP (last 3 results) No results for input(s): BNP in the last 8760 hours.  ProBNP (last 3 results) No results for input(s): PROBNP in the last 8760 hours.  CBG: No results for input(s): GLUCAP in the last 168 hours.  Signed:  Colen Grimes MD   [facility] 05/06/2024, 11:10 AM

## 2024-05-06 NOTE — Progress Notes (Signed)
 Speech Language Pathology Treatment: Dysphagia  Patient Details Name: Bryan Hart MRN: [id number] DOB: [birth year] Today's Date: 05/06/2024 Time: 8980-8957 SLP Time Calculation (min) (ACUTE ONLY): 23 min  Assessment / Plan / Recommendation Clinical Impression  REC: regular diet; thin liquids. No SLP f/u needed.  Pt much more alert and interactive today. He fed himself a package of graham crackers with normal attention, mastication, and no s/s of difficulty. He consumed sequential sips of thin water from a straw without incident, no concerns for aspiration.  Pt's wife and I discussed the challenges of dementia as it progresses and the likelihood of worsening swallowing and aspiration. We reviewed the signs to watch for as swallowing becomes more compromised; we discussed the contraindications of PEGs - she has a good grasp of the issues and she and Bryan Hart have had discussions about tube feedings in the past. Overall, however, my emphasis was on how well he is eating and drinking now.  He should continue a regular diet here and at home.  No further SLP needed.    HPI HPI: Pt is a 73 yo M who presented to ED 04/29/24 in Afib with hypotension and fall. CXR (05/04/24) revealed, Scattered airspace opacities in the right lung, suspicious for pneumonia. MBSS (03/14/24) noted, pharyngeal residue cleared better with second swallow, :he does not penetrate thin liquids via cup until consumed with the barium tablet, and a single instance of trace aspiration via straw... with pt clearing his throat to eject aspirates (PAS 6). Note that MBSS found that NTL CONTACTS cords and was not ejected out. OP SLP services from 10/2023- 03/2024 for dysphagia and voice therapy. PMhx: CVA, HLD, HTN, AFib, AV block, Rt TKA, dementia, Rt shoulder hemiarthroplasty, pharyngoesophageal dysphagia.      SLP Plan  All goals met          Recommendations  Diet recommendations: Regular;Thin liquid Liquids provided via:  Straw;Cup Medication Administration: Whole meds with puree Supervision: Patient able to self feed                  Oral care BID     Dysphagia, pharyngoesophageal phase (R13.14)     All goals met   Bryan Hart L. Vona, MA CCC/SLP Clinical Specialist - Acute Care SLP Acute Rehabilitation Services Office number [phone]   Bryan Hart Bryan Hart  05/06/2024, 10:48 AM

## 2024-05-06 NOTE — TOC Transition Note (Signed)
 Transition of Care [facility]) - Discharge Note   Patient Details  Name: Bryan Hart MRN: [id number] Date of Birth: Mar 03, 1951  Transition of [facility] CM/SW Contact:  Roxie KANDICE Stain, RN Phone Number: 05/06/2024, 11:58 AM   Clinical Narrative:    Spoke to patient's wife, [name], at bedside regarding transition needs. [name] requested [facility] for home health. Glenda with [facility] accepted referral. Patient has all needed DME. Family can transport home. Address, Phone number and PCP verified.    Final next level of care: Home w Home Health Services Barriers to Discharge: Barriers Resolved   Patient Goals and CMS Choice Patient states their goals for this hospitalization and ongoing recovery are:: return home          Discharge Placement             home          Discharge Plan and Services Additional resources added to the After Visit Summary for   In-house Referral: Clinical Social Work   Post Acute Care Choice: Skilled Nursing Facility                               Social Drivers of Health (SDOH) Interventions SDOH Screenings   Food Insecurity: No Food Insecurity (04/30/2024)  Housing: Low Risk  (04/30/2024)  Transportation Needs: No Transportation Needs (04/30/2024)  Utilities: Not At Risk (04/30/2024)  Social Connections: Patient Declined (04/30/2024)  Tobacco Use: Medium Risk (04/29/2024)     Readmission Risk Interventions    05/06/2024   11:58 AM  Readmission Risk Prevention Plan  Post Dischage Appt Complete  Medication Screening Complete  Transportation Screening Complete

## 2024-05-06 NOTE — Progress Notes (Addendum)
 Physical Therapy Treatment Patient Details Name: Bryan Hart MRN: [id number] DOB: [birth year] Today's Date: 05/06/2024   History of Present Illness 73 yo M presented to ED 04/29/24 in Afib with hypotension and fall. Pt with symptomatic orthostatic hypotension 11/5 and 11/6 PT sessions. PMhx: CVA, HLD, HTN, AFib, AV block, Rt TKA, dementia, Rt shoulder hemiarthroplasty    PT Comments  Pt admitted with above diagnosis. Pt was able to ambulate with RW with CGA progressing to hallway needing cues and assist for safety. Issued gait belt to wife and wife present to observe therapy. Pt doing better and wife agreeable to take pt home.  Recommend HHPT f/u initially.   Pt currently with functional limitations due to the deficits listed below (see PT Problem List). Pt will benefit from acute skilled PT to increase their independence and safety with mobility to allow discharge.       If plan is discharge home, recommend the following: Assist for transportation;Supervision due to cognitive status;Assistance with cooking/housework;Direct supervision/assist for medications management;Direct supervision/assist for financial management;Help with stairs or ramp for entrance;A little help with bathing/dressing/bathroom;A little help with walking and/or transfers   Can travel by private vehicle     No  Equipment Recommendations  Other (comment) (issued gait belt)    Recommendations for Other Services       Precautions / Restrictions Precautions Precautions: Fall;Other (comment) Recall of Precautions/Restrictions: Impaired Precaution/Restrictions Comments: Watch orthostatics and HR Restrictions Weight Bearing Restrictions Per Provider Order: No     Mobility  Bed Mobility Overal bed mobility: Needs Assistance Bed Mobility: Supine to Sit, Sit to Supine     Supine to sit: Min assist     General bed mobility comments: pt assisting to move BLE toward EOB but needing increased time and assist to perform  2/2 motor sequencing difficulty.    Transfers Overall transfer level: Needs assistance Equipment used: Rolling walker (2 wheels) Transfers: Sit to/from Stand, Bed to chair/wheelchair/BSC Sit to Stand: From elevated surface, Min assist           General transfer comment: Pt needing min assist to power up to RW with pt standing with flexed knees and flexed trunk with verbal and tactile cues to stand tall.  Pt also needed cues for hand placement.Pt also needed tactile assist due to posterior bias. Pt with   stable BPs sitting EOB and standing.  No dizziness reported.    Ambulation/Gait Ambulation/Gait assistance: Contact guard assist Gait Distance (Feet): 250 Feet Assistive device: Rolling walker (2 wheels) Gait Pattern/deviations: Shuffle, Step-through pattern, Decreased stride length, Trunk flexed, Drifts right/left   Gait velocity interpretation: <1.31 ft/sec, indicative of household ambulator   General Gait Details: Pt was able to progress ambulation with RW with cues and CGA. Pt takes small steps and needs cues to take larger steps. Pt needs cues for posture as well as he flexes trunk and LEs.  Overall, no LOB with use of RW needs guidance with gait belt and occasional assist to steer RW.  Also cues to stay close to RW. Wife present and educated in assisting pt with mobility. Issued gait belt.Discussed going up and down 1 step and wife aware of technique.     Stairs             Wheelchair Mobility     Tilt Bed    Modified Rankin (Stroke Patients Only)       Balance Overall balance assessment: History of Falls, Needs assistance Sitting-balance support: No upper extremity supported, Feet  supported, Single extremity supported Sitting balance-Leahy Scale: Fair   Postural control: Posterior lean Standing balance support: Bilateral upper extremity supported, During functional activity Standing balance-Leahy Scale: Poor Standing balance comment: Min assist and cues,   pt able to maintain static standing and dynamic standing with cues and CGA, RW and external suport.                            Communication Communication Communication: No apparent difficulties  Cognition Arousal: Alert Behavior During Therapy: Flat affect   PT - Cognitive impairments: Memory, Safety/Judgement, History of cognitive impairments, Sequencing, Problem solving, Awareness                         Following commands: Intact      Cueing Cueing Techniques: Verbal cues  Exercises General Exercises - Lower Extremity Ankle Circles/Pumps: AROM, Both, 10 reps, Supine Long Arc Quad: AROM, Both, 10 reps, Seated Hip Flexion/Marching: AROM, Both, 10 reps, Seated    General Comments General comments (skin integrity, edema, etc.): supine 56 bpm, 117/77; sitting 60 bpm, 144/86; standing 65 bpm, 130/95.  End  of rx, 62 bpm, 125/79      Pertinent Vitals/Pain Pain Assessment Pain Assessment: No/denies pain Faces Pain Scale: No hurt Breathing: normal Negative Vocalization: occasional moan/groan, low speech, negative/disapproving quality Facial Expression: sad, frightened, frown Body Language: relaxed Consolability: no need to console PAINAD Score: 2    Home Living                          Prior Function            PT Goals (current goals can now be found in the care plan section) Acute Rehab PT Goals Patient Stated Goal: Per spouse, for him to be able to walk in order to go home and stable BP in standing. Progress towards PT goals: Progressing toward goals    Frequency    Min 2X/week      PT Plan      Co-evaluation              AM-PAC PT 6 Clicks Mobility   Outcome Measure  Help needed turning from your back to your side while in a flat bed without using bedrails?: A Little Help needed moving from lying on your back to sitting on the side of a flat bed without using bedrails?: A Little Help needed moving to and from a  bed to a chair (including a wheelchair)?: A Little Help needed standing up from a chair using your arms (e.g., wheelchair or bedside chair)?: A Little Help needed to walk in hospital room?: A Little Help needed climbing 3-5 steps with a railing? : A Lot 6 Click Score: 17    End of Session Equipment Utilized During Treatment: Gait belt Activity Tolerance: Patient tolerated treatment well Patient left: with call bell/phone within reach;with family/visitor present;in chair Nurse Communication: Mobility status PT Visit Diagnosis: Other abnormalities of gait and mobility (R26.89);History of falling (Z91.81);Difficulty in walking, not elsewhere classified (R26.2)     Time: 9061-8980 PT Time Calculation (min) (ACUTE ONLY): 41 min  Charges:    $Gait Training: 8-22 mins $Therapeutic Exercise: 8-22 mins $Self Care/Home Management: 8-22 PT General Charges $$ ACUTE PT VISIT: 1 Visit                     Danaiya Steadman M,PT Acute  Rehab Services [phone]    Stephane JULIANNA Bevel 05/06/2024, 10:36 AM

## 2024-05-08 DIAGNOSIS — Z556 Problems related to health literacy: Secondary | ICD-10-CM | POA: Diagnosis not present

## 2024-05-08 DIAGNOSIS — Z9181 History of falling: Secondary | ICD-10-CM | POA: Diagnosis not present

## 2024-05-08 DIAGNOSIS — G4730 Sleep apnea, unspecified: Secondary | ICD-10-CM | POA: Diagnosis not present

## 2024-05-08 DIAGNOSIS — Z96651 Presence of right artificial knee joint: Secondary | ICD-10-CM | POA: Diagnosis not present

## 2024-05-08 DIAGNOSIS — I493 Ventricular premature depolarization: Secondary | ICD-10-CM | POA: Diagnosis not present

## 2024-05-08 DIAGNOSIS — I480 Paroxysmal atrial fibrillation: Secondary | ICD-10-CM | POA: Diagnosis not present

## 2024-05-08 DIAGNOSIS — I4892 Unspecified atrial flutter: Secondary | ICD-10-CM | POA: Diagnosis not present

## 2024-05-08 DIAGNOSIS — F0390 Unspecified dementia without behavioral disturbance: Secondary | ICD-10-CM | POA: Diagnosis not present

## 2024-05-08 DIAGNOSIS — I10 Essential (primary) hypertension: Secondary | ICD-10-CM | POA: Diagnosis not present

## 2024-05-08 DIAGNOSIS — E785 Hyperlipidemia, unspecified: Secondary | ICD-10-CM | POA: Diagnosis not present

## 2024-05-08 DIAGNOSIS — Z7901 Long term (current) use of anticoagulants: Secondary | ICD-10-CM | POA: Diagnosis not present

## 2024-05-08 DIAGNOSIS — M1990 Unspecified osteoarthritis, unspecified site: Secondary | ICD-10-CM | POA: Diagnosis not present

## 2024-05-08 DIAGNOSIS — Z8673 Personal history of transient ischemic attack (TIA), and cerebral infarction without residual deficits: Secondary | ICD-10-CM | POA: Diagnosis not present

## 2024-05-08 DIAGNOSIS — I4430 Unspecified atrioventricular block: Secondary | ICD-10-CM | POA: Diagnosis not present

## 2024-05-09 DIAGNOSIS — Z7689 Persons encountering health services in other specified circumstances: Secondary | ICD-10-CM | POA: Diagnosis not present

## 2024-05-09 LAB — CULTURE, BLOOD (ROUTINE X 2)
Culture: NO GROWTH
Culture: NO GROWTH

## 2024-05-13 ENCOUNTER — Ambulatory Visit

## 2024-05-15 ENCOUNTER — Telehealth: Payer: Self-pay | Admitting: Diagnostic Neuroimaging

## 2024-05-15 DIAGNOSIS — N390 Urinary tract infection, site not specified: Secondary | ICD-10-CM | POA: Diagnosis not present

## 2024-05-15 NOTE — Telephone Encounter (Signed)
 Wife reports since pt has been home from the hospital dementia with other behavioral disturbance has worsen, she would like a call to discuss.

## 2024-05-15 NOTE — Telephone Encounter (Signed)
 Call to wife who reports after 1 week hospital admission for Afib and pneumonia, being home patient is having hallucinations at night and difficulty settling down. He has had hallucinations previously but not as frequent or long lasting. She reports him taking the memantine  for memory. Advised to call PCP to rule out UTI and I would also send to Dr. [name] to review.    Wife appreciative of call

## 2024-05-21 ENCOUNTER — Ambulatory Visit: Admitting: Cardiology

## 2024-05-21 ENCOUNTER — Telehealth: Payer: Self-pay | Admitting: Diagnostic Neuroimaging

## 2024-05-21 NOTE — Telephone Encounter (Signed)
 LMVM for pt to return call.

## 2024-05-21 NOTE — Telephone Encounter (Signed)
 Pt's wife called wanting to speak to the nurse regarding some worsening symptoms she has been noticing the pt having with his dementia since leaving the hosp. Please advise.

## 2024-05-21 NOTE — Telephone Encounter (Signed)
 Pt's wife has called Particia, RN back, she declined asking for a call back because she wants to speak while not in the presence of pt.She will call back today and also in the morning.

## 2024-05-22 ENCOUNTER — Inpatient Hospital Stay (INDEPENDENT_AMBULATORY_CARE_PROVIDER_SITE_OTHER)
Admit: 2024-05-22 | Discharge: 2024-05-22 | Disposition: A | Discharge status: Home / Self Care | Attending: Physician Assistant | Admitting: Physician Assistant

## 2024-05-22 VITALS — BP 80/50 | HR 57 | Ht 72.0 in | Wt 168.4 lb

## 2024-05-22 DIAGNOSIS — I480 Paroxysmal atrial fibrillation: Secondary | ICD-10-CM | POA: Diagnosis not present

## 2024-05-22 DIAGNOSIS — Z5181 Encounter for therapeutic drug level monitoring: Secondary | ICD-10-CM | POA: Diagnosis not present

## 2024-05-22 DIAGNOSIS — Z79899 Other long term (current) drug therapy: Secondary | ICD-10-CM | POA: Diagnosis not present

## 2024-05-22 DIAGNOSIS — I10 Essential (primary) hypertension: Secondary | ICD-10-CM

## 2024-05-22 DIAGNOSIS — I4891 Unspecified atrial fibrillation: Secondary | ICD-10-CM

## 2024-05-22 DIAGNOSIS — D6869 Other thrombophilia: Secondary | ICD-10-CM

## 2024-05-22 MED ORDER — METOPROLOL TARTRATE 25 MG PO TABS: 12.5000 mg | ORAL_TABLET | Freq: Two times a day (BID) | ORAL | 3 refills | Status: AC

## 2024-05-22 MED ORDER — FLECAINIDE ACETATE 100 MG PO TABS: 100.0000 mg | ORAL_TABLET | Freq: Two times a day (BID) | ORAL | 1 refills | Status: DC

## 2024-05-22 NOTE — Progress Notes (Signed)
 Primary Care Physician: [name], DO Primary Cardiologist: Dorn Lesches, MD Electrophysiologist: None  Referring Physician: [facility]   Bryan Hart is a 73 y.o. male with a history of PVCs, HTN, CVA, OSA, dementia, atrial fibrillation who presents for follow up in the [facility]. Patient was seen by his PCP 04/04/23 for dizziness and fatiuge. ECG showed afib with RVR and he was directed to the ED. He received IV diltiazem  and his blood pressure dropped to 80 systolic. Diltiazem  gtt. was stopped and his blood pressure improved to the 120s. His heart rate remained in the 70s with ambulation. Cardiology was consulted. It was recommended that metoprolol  50 mg twice daily be started. He tolerated the medication well. He remained in atrial fibrillation with controlled rate in the 90s-100s. He was also started on Eliquis  for stroke prevention. He spontaneously converted after discharge.   Patient returns for follow up for atrial fibrillation and flecainide  monitoring. He was admitted 04/29/24 for afib with RVR. He was started on flecainide  which converted him to SR. He was also treated for suspected aspiration pneumonia. He is in SR today and feels well. No bleeding issues on anticoagulation. He has had some intermittent lightheadedness.    Today, he  denies symptoms of palpitations, chest pain, shortness of breath, orthopnea, PND, lower extremity edema, presyncope, syncope, bleeding, or neurologic sequela. The patient is tolerating medications without difficulties and is otherwise without complaint today.    Atrial Fibrillation Risk Factors:  he does have symptoms or diagnosis of sleep apnea. he does not have a history of rheumatic fever.   Atrial Fibrillation Management history:  Previous antiarrhythmic drugs: flecainide  Previous cardioversions: none Previous ablations: none Anticoagulation history: Eliquis   ROS- All systems are reviewed and negative  except as per the HPI above.  Past Medical History:  Diagnosis Date   Arthritis    HLD (hyperlipidemia)    Hypertension    dr  ed green      stress test   12 yrs ago   PVC's (premature ventricular contractions) 12 years ago   stress test done   Sleep apnea    Stroke [facility])    minor stroke 01/2018    Current Outpatient Medications  Medication Sig Dispense Refill   apixaban  (ELIQUIS ) 5 MG TABS tablet Take 1 tablet (5 mg total) by mouth 2 (two) times daily. 60 tablet 5   atorvastatin  (LIPITOR) 20 MG tablet Take 20 mg by mouth daily.     memantine  (NAMENDA ) 10 MG tablet Take 1 tablet (10 mg total) by mouth 2 (two) times daily. 180 tablet 4   Multiple Vitamins-Minerals (MULTIVITAMIN ADULTS PO) Take 1 tablet by mouth every morning.     flecainide  (TAMBOCOR ) 100 MG tablet Take 1 tablet (100 mg total) by mouth every 12 (twelve) hours. 60 tablet 1   metoprolol  tartrate (LOPRESSOR ) 25 MG tablet Take 0.5 tablets (12.5 mg total) by mouth 2 (two) times daily. 30 tablet 3   No current facility-administered medications for this encounter.    Physical Exam: BP (!) 80/50   Pulse (!) 57   Ht 6' (1.829 m)   Wt 76.4 kg   BMI 22.84 kg/m   GEN: Well nourished, well developed in no acute distress CARDIAC: Regular rate and rhythm, no murmurs, rubs, gallops RESPIRATORY:  Clear to auscultation without rales, wheezing or rhonchi  ABDOMEN: Soft, non-tender, non-distended EXTREMITIES:  No edema; No deformity    Wt Readings from Last 3 Encounters:  05/22/24 76.4 kg  04/30/24 73.6 kg  02/22/24 77.1 kg     EKG Interpretation Date/Time:  Wednesday May 22 2024 10:45:07 EST Ventricular Rate:  57 PR Interval:  188 QRS Duration:  110 QT Interval:  458 QTC Calculation: 445 R Axis:   -18  Text Interpretation: Sinus bradycardia When compared with ECG of 03-May-2024 10:58, No significant change was found Confirmed by Eustolia Drennen ([id number]) on 05/22/2024 11:09:13 AM    Echo 05/12/23 demonstrated    1. Left ventricular ejection fraction, by estimation, is 60 to 65%. The  left ventricle has normal function. The left ventricle has no regional  wall motion abnormalities. Left ventricular diastolic parameters were  normal. The average left ventricular global longitudinal strain is -16.6 %. The global longitudinal strain is normal.   2. Right ventricular systolic function is normal. The right ventricular  size is normal.   3. The mitral valve is abnormal. No evidence of mitral valve  regurgitation. No evidence of mitral stenosis.   4. The aortic valve is tricuspid. There is moderate calcification of the  aortic valve. There is moderate thickening of the aortic valve. Aortic  valve regurgitation is trivial. Aortic valve sclerosis is present, with no  evidence of aortic valve stenosis.   5. Aortic dilatation noted. There is mild dilatation of the aortic root,  measuring 39 mm.   6. The inferior vena cava is normal in size with greater than 50%  respiratory variability, suggesting right atrial pressure of 3 mmHg.   Comparison(s): EF 55%, trivial AI, mild MAC, AOR 39 mm.    CHA2DS2-VASc Score = 5  The patient's score is based upon: CHF History: 0 HTN History: 1 Diabetes History: 0 Stroke History: 2 Vascular Disease History: 0 Age Score: 2 Gender Score: 0       ASSESSMENT AND PLAN: Paroxysmal Atrial Fibrillation (ICD10:  I48.0) The patient's CHA2DS2-VASc score is 5, indicating a 7.2% annual risk of stroke.   Patient appears to be maintaining SR Continue flecainide  100 mg BID Decrease Lopressor  to 12.5 mg BID given hypotension and lightheadedness.  Continue Eliquis  5 mg BID  Secondary Hypercoagulable State (ICD10:  D68.69) The patient is at significant risk for stroke/thromboembolism based upon his CHA2DS2-VASc Score of 5.  Continue Apixaban  (Eliquis ). No bleeding issues.   High Risk Medication Monitoring (ICD 10: J342684) Patient requires ongoing monitoring for anti-arrhythmic  medication which has the potential to cause life threatening arrhythmias. Intervals on ECG acceptable for flecainide  monitoring.     OSA  Not currently on CPAP due to intolerance.   HTN Low today, decrease BB as above.    Follow up with Dr Court as scheduled and in the AF clinic in 3 months.      Daril Kicks PA-C [facility] [address] [place], [place] [address] [phone]

## 2024-05-22 NOTE — Telephone Encounter (Signed)
 Wife returned call.  Pt was in hospital for afib/pneumonia, has not been himself, weak- getting PT, decreased cognitively.  He continues on the memantine  10mg  po bid.  I relayed that due to illness, and different surroundings, (now back at home) it could affect memory.  Time/healing may get back to baseline, but dementia is a progressive disease, the medication slows progression but does not stop it.  I told her could schedule appt , would be out some, she was ok with this and could cancel if doing ok.  I made appt 09-03-2024 at 1430.  She appreciated call back.

## 2024-05-22 NOTE — Patient Instructions (Addendum)
 Decrease metoprolol  to 12.5mg  twice a day (1/2 of a 25mg  tablet)

## 2024-05-27 ENCOUNTER — Ambulatory Visit: Attending: Cardiovascular Disease | Admitting: Cardiovascular Disease

## 2024-05-27 ENCOUNTER — Encounter: Payer: Self-pay | Admitting: Cardiovascular Disease

## 2024-05-27 VITALS — BP 88/62 | HR 50 | Ht 72.0 in | Wt 165.8 lb

## 2024-05-27 DIAGNOSIS — E782 Mixed hyperlipidemia: Secondary | ICD-10-CM | POA: Diagnosis not present

## 2024-05-27 DIAGNOSIS — I10 Essential (primary) hypertension: Secondary | ICD-10-CM | POA: Diagnosis not present

## 2024-05-27 DIAGNOSIS — G4731 Primary central sleep apnea: Secondary | ICD-10-CM | POA: Diagnosis not present

## 2024-05-27 DIAGNOSIS — I480 Paroxysmal atrial fibrillation: Secondary | ICD-10-CM

## 2024-05-27 NOTE — Patient Instructions (Signed)
 Medication Instructions:  Your physician recommends that you continue on your current medications as directed. Please refer to the Current Medication list given to you today.  *If you need a refill on your cardiac medications before your next appointment, please call your pharmacy*   Follow-Up: At [facility], you and your health needs are our priority.  As part of our continuing mission to provide you with exceptional heart care, our providers are all part of one team.  This team includes your primary Cardiologist (physician) and Advanced Practice Providers or APPs (Physician Assistants and Nurse Practitioners) who all work together to provide you with the care you need, when you need it.  Your next appointment:   6 month(s)  Provider:   Lum Louis, NP, Aline Door, PA-C, Kathleen Johnson, PA-C, Hao Meng, PA-C, Damien Braver, NP, Glendia Ferrier, PA-C, or Katlyn West, NP         Then, Dorn Lesches, MD will plan to see you again in 12 month(s).

## 2024-05-27 NOTE — Assessment & Plan Note (Signed)
 History of essential hypertension hypertension which is really not an ongoing problem.  Blood pressure today is 88/62 on low-dose metoprolol .

## 2024-05-27 NOTE — Assessment & Plan Note (Signed)
 History of PAF recently admitted to the hospital for A-fib with RVR and converted with flecainide .  His CV score is 4.  He is on Eliquis  oral anticoagulation.  He has remained in sinus rhythm.

## 2024-05-27 NOTE — Assessment & Plan Note (Signed)
 History of hyperlipidemia on atorvastatin  with lipid profile performed 07/05/2023 revealing total cholesterol 115, LDL 53 and HDL 54.

## 2024-05-27 NOTE — Progress Notes (Signed)
 05/27/2024 Bryan Hart   1950/07/17  [id number]  Primary Physician Bryan Skates, DO Primary Cardiologist: Bryan Bryan Lesches MD Bryan Hart, Bryan Hart  HPI:  Bryan Hart is a 73 y.o.  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  He was referred by Dr. Valentin for evaluation of asymptomatic PVCs.  He is accompanied by his wife Bryan Hart today.  I last saw him in the office 11/07/2023.  His risk factors include treated hypertension and hyperlipidemia.  He has a strong family history of heart disease with a father who had a myocardial infarction at age 90, brother who had bypass surgery at 3 and another brother had a myocardial infarction.  He has never had a heart attack but has had a remote TIA.  He is active and works out at [employer] 3-4 times a week on the elliptical.  He denies chest pain or shortness of breath.  He does have a history of obstructive sleep apnea intolerant to CPAP.  He was having unexplained weight loss as well, although this has stabilized.SABRA  He does have asymptomatic PVCs.,  And a event monitor performed 07/26/2022 showed occasional PACs, PVCs and short runs of SVT.   He had an episode of PAF 04/04/2023.  He was seen in the ER and converted spontaneously with IV diltiazem .  He was seen in the A-fib clinic and begun on Eliquis  with a CV risk score of 4.  Since I saw him 7 months ago he was admitted to the hospital with for A-fib with RVR 03/29/2024 and discharged a week later.  He is begun on flecainide .  He remains on Eliquis  oral anticoagulation and is asymptomatic.  He is intolerant to CPAP and has sleep apnea which may be a trigger for A-fib.  We did discuss the inspire device.   Current Meds  Medication Sig   apixaban  (ELIQUIS ) 5 MG TABS tablet Take 1 tablet (5 mg total) by mouth 2 (two) times daily.   atorvastatin  (LIPITOR) 20 MG tablet Take 20 mg by mouth daily.    flecainide  (TAMBOCOR ) 100 MG tablet Take 1 tablet (100 mg total) by mouth every 12 (twelve) hours.   memantine  (NAMENDA ) 10 MG tablet Take 1 tablet (10 mg total) by mouth 2 (two) times daily.   metoprolol  tartrate (LOPRESSOR ) 25 MG tablet Take 0.5 tablets (12.5 mg total) by mouth 2 (two) times daily.   Multiple Vitamins-Minerals (MULTIVITAMIN ADULTS PO) Take 1 tablet by mouth every morning.     Allergies  Allergen Reactions   Adhesive [Tape] Rash    Social History   Socioeconomic History   Marital status: Married    Spouse name: Bryan Hart   Number of children: Not on file   Years of education: Not on file   Highest education level: Master's degree (e.g., MA, MS, MEng, MEd, MSW, MBA)  Occupational History   Not on file  Tobacco Use   Smoking status: Former    Current packs/day: 0.00    Average packs/day: 0.5 packs/day for 4.0 years (2.0 ttl pk-yrs)    Types: Cigarettes    Start date: 50    Quit date: 6    Years since quitting: 50.9   Smokeless tobacco: Never   Tobacco comments:    Former smoker 04/12/23  Vaping Use   Vaping status: Never Used  Substance and Sexual Activity   Alcohol  use: Yes  Alcohol /week: 1.0 - 2.0 standard drink of alcohol     Types: 1 - 2 Cans of beer per week    Comment: daily   Drug use: No   Sexual activity: Not on file  Other Topics Concern   Not on file  Social History Narrative   Lives home with spouse Bryan Hart.   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   Left handed    Social Drivers of Health   Financial Resource Strain: Not on file  Food Insecurity: No Food Insecurity (04/30/2024)   Hunger Vital Sign    Worried About Running Out of Food in the Last Year: Never true    Ran Out of Food in the Last Year: Never true  Transportation Needs: No Transportation Needs (04/30/2024)   PRAPARE - Administrator, Civil Service (Medical): No    Lack of Transportation (Non-Medical): No  Physical Activity: Not on file  Stress: Not on file   Social Connections: Patient Declined (04/30/2024)   Social Connection and Isolation Panel    Frequency of Communication with Friends and Family: Patient declined    Frequency of Social Gatherings with Friends and Family: Patient declined    Attends Religious Services: Patient declined    Database Administrator or Organizations: Patient declined    Attends Banker Meetings: Patient declined    Marital Status: Patient declined  Intimate Partner Violence: Not At Risk (04/30/2024)   Humiliation, Afraid, Rape, and Kick questionnaire    Fear of Current or Ex-Partner: No    Emotionally Abused: No    Physically Abused: No    Sexually Abused: No     Review of Systems: General: negative for chills, fever, night sweats or weight changes.  Cardiovascular: negative for chest pain, dyspnea on exertion, edema, orthopnea, palpitations, paroxysmal nocturnal dyspnea or shortness of breath Dermatological: negative for rash Respiratory: negative for cough or wheezing Urologic: negative for hematuria Abdominal: negative for nausea, vomiting, diarrhea, bright red blood per rectum, melena, or hematemesis Neurologic: negative for visual changes, syncope, or dizziness All other systems reviewed and are otherwise negative except as noted above.    Blood pressure (!) 88/62, pulse (!) 50, height 6' (1.829 m), weight 165 lb 12.8 oz (75.2 kg), SpO2 99%.  General appearance: alert and no distress Neck: no adenopathy, no carotid bruit, no JVD, supple, symmetrical, trachea midline, and thyroid  not enlarged, symmetric, no tenderness/mass/nodules Lungs: clear to auscultation bilaterally Heart: regular rate and rhythm, S1, S2 normal, no murmur, click, rub or gallop Extremities: extremities normal, atraumatic, no cyanosis or edema Pulses: 2+ and symmetric Skin: Skin color, texture, turgor normal. No rashes or lesions Neurologic: Grossly normal  EKG not performed today      ASSESSMENT AND PLAN:    Central sleep apnea History of obstructive sleep apnea intolerant to CPAP.  We did discuss the inspire device.  I encouraged him to have his primary care doctor refer him to a pulmonologist to discuss.  Hyperlipidemia History of hyperlipidemia on atorvastatin  with lipid profile performed 07/05/2023 revealing total cholesterol 115, LDL 53 and HDL 54.  Essential hypertension History of essential hypertension hypertension which is really not an ongoing problem.  Blood pressure today is 88/62 on low-dose metoprolol .  PAF (paroxysmal atrial fibrillation) (HCC) History of PAF recently admitted to the hospital for A-fib with RVR and converted with flecainide .  His CV score is 4.  He is on Eliquis  oral anticoagulation.  He has remained in sinus rhythm.  Bryan DOROTHA Lesches MD FACP,FACC,FAHA, [facility] 05/27/2024 10:20 AM

## 2024-05-27 NOTE — Assessment & Plan Note (Signed)
 History of obstructive sleep apnea intolerant to CPAP.  We did discuss the inspire device.  I encouraged him to have his primary care doctor refer him to a pulmonologist to discuss.

## 2024-05-28 ENCOUNTER — Ambulatory Visit: Admitting: Podiatry

## 2024-05-28 DIAGNOSIS — M79674 Pain in right toe(s): Secondary | ICD-10-CM

## 2024-05-28 DIAGNOSIS — Z7901 Long term (current) use of anticoagulants: Secondary | ICD-10-CM | POA: Diagnosis not present

## 2024-05-28 DIAGNOSIS — B351 Tinea unguium: Secondary | ICD-10-CM

## 2024-05-28 DIAGNOSIS — M79675 Pain in left toe(s): Secondary | ICD-10-CM

## 2024-05-29 NOTE — Progress Notes (Signed)
 Subjective: Chief Complaint  Patient presents with   Nail Problem    Pt is here to have his nails trimmed     73 year old male presents after the above concerns.  States his nails are thickened elongated he has difficulty trimming them himself. He states when he tries to trim them he causes bleeding.  He is on Eliquis .   Objective: AAO x3, NAD DP/PT pulses palpable bilaterally, CRT less than 3 seconds Nails are hypertrophic, dystrophic, brittle, discolored, elongated 10. No surrounding redness or drainage. Tenderness nails 1-5 bilaterally. No open lesions or pre-ulcerative lesions are identified today. No pain with calf compression, swelling, warmth, erythema  Assessment: Symptomatic onychomycosis  Plan: -All treatment options discussed with the patient including all alternatives, risks, complications.  -Sharply debrided nails x 10 without any complications or bleeding.  Given use of blood thinners and difficulty bending over to trim his nails recommend routine debridement. -Patient encouraged to call the office with any questions, concerns, change in symptoms.   Return in about 3 months (around 08/26/2024).   Donnice JONELLE Fees DPM

## 2024-06-21 ENCOUNTER — Encounter: Payer: Self-pay | Admitting: Orthopaedic Surgery

## 2024-07-22 ENCOUNTER — Other Ambulatory Visit (HOSPITAL_COMMUNITY): Payer: Self-pay | Admitting: Physician Assistant

## 2024-07-24 ENCOUNTER — Encounter: Payer: Self-pay | Admitting: Orthopaedic Surgery

## 2024-07-25 ENCOUNTER — Other Ambulatory Visit: Payer: Self-pay | Admitting: Physician Assistant

## 2024-07-25 DIAGNOSIS — Z01818 Encounter for other preprocedural examination: Secondary | ICD-10-CM

## 2024-07-31 NOTE — Pre-Procedure Instructions (Signed)
 Surgical Instructions   Your procedure is scheduled on Tuesday, February 10th. Report to [facility] Main Entrance A at 06:30 A.M., then check in with the Admitting office. Any questions or running late day of surgery: call [phone]  Questions prior to your surgery date: call [phone], Monday-Friday, 8am-4pm. If you experience any cold or flu symptoms such as cough, fever, chills, shortness of breath, etc. between now and your scheduled surgery, please notify us  at the above number.     Remember:  Do not eat after midnight the night before your surgery  You may drink clear liquids until 05:30 AM the morning of your surgery.   Clear liquids allowed are: Water, Non-Citrus Juices (without pulp), Carbonated Beverages, Clear Tea (no milk, honey, etc.), Black Coffee Only (NO MILK, CREAM OR POWDERED CREAMER of any kind), and Gatorade.  Patient Instructions  The night before surgery:  No food after midnight. ONLY clear liquids after midnight  The day of surgery (if you do NOT have diabetes):  Drink ONE (1) Pre-Surgery Clear Ensure by 05:30 AM the morning of surgery. Drink in one sitting. Do not sip.  This drink was given to you during your hospital  pre-op appointment visit.  Nothing else to drink after completing the  Pre-Surgery Clear Ensure.          If you have questions, please contact your surgeons office.    Take these medicines the morning of surgery with A SIP OF WATER  atorvastatin  (LIPITOR)  flecainide  (TAMBOCOR )  memantine  (NAMENDA )  metoprolol  tartrate (LOPRESSOR )     Follow your surgeon's instructions on when to stop Eliquis .  If no instructions were given by your surgeon then you will need to call the office to get those instructions.    One week prior to surgery, STOP taking any Aspirin  (unless otherwise instructed by your surgeon) Aleve, Naproxen, Ibuprofen, Motrin, Advil, Goody's, BC's, all herbal medications, fish oil, and non-prescription vitamins.                      Do NOT Smoke (Tobacco/Vaping) for 24 hours prior to your procedure.  If you use a CPAP at night, you may bring your mask/headgear for your overnight stay.   You will be asked to remove any contacts, glasses, piercing's, hearing aid's, dentures/partials prior to surgery. Please bring cases for these items if needed.    Your surgeon will determine if you are to be admitted or discharged the same day.  Patients discharged the day of surgery will not be allowed to drive home, and someone needs to stay with them for 24 hours.  SURGICAL WAITING ROOM VISITATION Patients may have no more than 2 support people in the waiting area - these visitors may rotate.   Pre-op nurse will coordinate an appropriate time for 2 ADULT support persons, who may not rotate, to accompany patient in pre-op.  Children under the age of 56 must have an adult with them who is not the patient and must remain in the main waiting area with an adult.  If the patient needs to stay at the hospital during part of their recovery, the visitor guidelines for inpatient rooms apply.  Please refer to the [facility] website for the visitor guidelines for any additional information.   If you received a COVID test during your pre-op visit  it is requested that you wear a mask when out in public, stay away from anyone that may not be feeling well and notify your  surgeon if you develop symptoms. If you have been in contact with anyone that has tested positive in the last 10 days please notify you surgeon.      Pre-operative 4 CHG Bathing Instructions   You can play a key role in reducing the risk of infection after surgery. Your skin needs to be as free of germs as possible. You can reduce the number of germs on your skin by washing with CHG (chlorhexidine  gluconate) soap before surgery. CHG is an antiseptic soap that kills germs and continues to kill germs even after washing.   DO NOT use if you have an allergy to  chlorhexidine /CHG or antibacterial soaps. If your skin becomes reddened or irritated, stop using the CHG and notify one of our RNs at [phone].   Please shower with the CHG soap starting 4 days before surgery using the following schedule:     Please keep in mind the following:  DO NOT shave, including legs and underarms, starting the day of your first shower.   You may shave your face at any point before/day of surgery.  Place clean sheets on your bed the day you start using CHG soap. Use a clean washcloth (not used since being washed) for each shower. DO NOT sleep with pets once you start using the CHG.   CHG Shower Instructions:  Wash your face and private area with normal soap. If you choose to wash your hair, wash first with your normal shampoo.  After you use shampoo/soap, rinse your hair and body thoroughly to remove shampoo/soap residue.  Turn the water OFF and apply  bottle of CHG soap to a CLEAN washcloth.  Apply CHG soap ONLY FROM YOUR NECK DOWN TO YOUR TOES (washing for 3-5 minutes)  DO NOT use CHG soap on face, private areas, open wounds, or sores.  Pay special attention to the area where your surgery is being performed.  If you are having back surgery, having someone wash your back for you may be helpful. Wait 2 minutes after CHG soap is applied, then you may rinse off the CHG soap.  Pat dry with a clean towel  Put on clean clothes/pajamas   If you choose to wear lotion, please use ONLY the CHG-compatible lotions that are listed below.  Additional instructions for the day of surgery:  If you choose, you may shower the morning of surgery with an antibacterial soap.  DO NOT APPLY any lotions, deodorants, cologne, or perfumes.   Do not bring valuables to the hospital. [facility] is not responsible for any belongings/valuables. Do not wear nail polish, gel polish, artificial nails, or any other type of covering on natural nails (fingers and toes) Do not wear jewelry or  makeup Put on clean/comfortable clothes.  Please brush your teeth.  Ask your nurse before applying any prescription medications to the skin.     CHG Compatible Lotions   Aveeno Moisturizing lotion  Cetaphil Moisturizing Cream  Cetaphil Moisturizing Lotion  Clairol Herbal Essence Moisturizing Lotion, Dry Skin  Clairol Herbal Essence Moisturizing Lotion, Extra Dry Skin  Clairol Herbal Essence Moisturizing Lotion, Normal Skin  Curel Age Defying Therapeutic Moisturizing Lotion with Alpha Hydroxy  Curel Extreme Care Body Lotion  Curel Soothing Hands Moisturizing Hand Lotion  Curel Therapeutic Moisturizing Cream, Fragrance-Free  Curel Therapeutic Moisturizing Lotion, Fragrance-Free  Curel Therapeutic Moisturizing Lotion, Original Formula  Eucerin Daily Replenishing Lotion  Eucerin Dry Skin Therapy Plus Alpha Hydroxy Crme  Eucerin Dry Skin Therapy Plus Alpha Hydroxy Lotion  Eucerin Original Crme  Eucerin Original Lotion  Eucerin Plus Crme Eucerin Plus Lotion  Eucerin TriLipid Replenishing Lotion  Keri Anti-Bacterial Hand Lotion  Keri Deep Conditioning Original Lotion Dry Skin Formula Softly Scented  Keri Deep Conditioning Original Lotion, Fragrance Free Sensitive Skin Formula  Keri Lotion Fast Absorbing Fragrance Free Sensitive Skin Formula  Keri Lotion Fast Absorbing Softly Scented Dry Skin Formula  Keri Original Lotion  Keri Skin Renewal Lotion Keri Silky Smooth Lotion  Keri Silky Smooth Sensitive Skin Lotion  Nivea Body Creamy Conditioning Oil  Nivea Body Extra Enriched Lotion  Nivea Body Original Lotion  Nivea Body Sheer Moisturizing Lotion Nivea Crme  Nivea Skin Firming Lotion  NutraDerm 30 Skin Lotion  NutraDerm Skin Lotion  NutraDerm Therapeutic Skin Cream  NutraDerm Therapeutic Skin Lotion  ProShield Protective Hand Cream  Provon moisturizing lotion  Please read over the following fact sheets that you were given.

## 2024-08-01 ENCOUNTER — Encounter (HOSPITAL_COMMUNITY): Payer: Self-pay

## 2024-08-01 ENCOUNTER — Other Ambulatory Visit: Payer: Self-pay

## 2024-08-01 ENCOUNTER — Inpatient Hospital Stay (HOSPITAL_COMMUNITY): Admission: RE | Admit: 2024-08-01 | Discharge: 2024-08-01 | Attending: Orthopaedic Surgery

## 2024-08-01 VITALS — BP 116/86 | HR 59 | Temp 97.9°F | Resp 17 | Ht 72.0 in | Wt 170.2 lb

## 2024-08-01 DIAGNOSIS — Z01818 Encounter for other preprocedural examination: Secondary | ICD-10-CM

## 2024-08-01 HISTORY — DX: Unspecified dementia, unspecified severity, without behavioral disturbance, psychotic disturbance, mood disturbance, and anxiety: F03.90

## 2024-08-01 LAB — CBC
HCT: 41.1 % (ref 39.0–52.0)
Hemoglobin: 13.3 g/dL (ref 13.0–17.0)
MCH: 31.4 pg (ref 26.0–34.0)
MCHC: 32.4 g/dL (ref 30.0–36.0)
MCV: 97.2 fL (ref 80.0–100.0)
Platelets: 207 10*3/uL (ref 150–400)
RBC: 4.23 MIL/uL (ref 4.22–5.81)
RDW: 13.5 % (ref 11.5–15.5)
WBC: 5 10*3/uL (ref 4.0–10.5)
nRBC: 0 % (ref 0.0–0.2)

## 2024-08-01 LAB — TYPE AND SCREEN
ABO/RH(D): B POS
Antibody Screen: NEGATIVE

## 2024-08-01 LAB — SURGICAL PCR SCREEN
MRSA, PCR: NEGATIVE
Staphylococcus aureus: NEGATIVE

## 2024-08-01 LAB — BASIC METABOLIC PANEL WITH GFR
Anion gap: 10 (ref 5–15)
BUN: 20 mg/dL (ref 8–23)
CO2: 27 mmol/L (ref 22–32)
Calcium: 9.3 mg/dL (ref 8.9–10.3)
Chloride: 103 mmol/L (ref 98–111)
Creatinine, Ser: 1.07 mg/dL (ref 0.61–1.24)
GFR, Estimated: >60 mL/min
Glucose, Bld: 109 mg/dL — ABNORMAL HIGH (ref 70–99)
Potassium: 4.1 mmol/L (ref 3.5–5.1)
Sodium: 140 mmol/L (ref 135–145)

## 2024-08-01 NOTE — Progress Notes (Signed)
 PCP - [name], DO Cardiologist - Court Nicks, MD Electrophysiology- Nellene Quita SAUNDERS, PA  Neurologist-[name], Vikram MD  PPM/ICD - denies Device Orders - n/a Rep Notified - n/a  Chest x-ray -05/04/2024  EKG - 05/22/2024 Stress Test - 2002 ECHO - 05/12/2023 Cardiac Cath - denies  Sleep Study - yes CPAP - no  Fasting Blood Sugar - no DM Checks Blood Sugar _____ times a day  Last dose of GLP1 agonist-  n/a GLP1 instructions: n/a  Blood Thinner Instructions: follow your surgeon's instructions on when to stop apixaban  (ELIQUIS )  Aspirin  Instructions: n/a  ERAS Protcol - yes PRE-SURGERY Ensure or G2- Ensure  COVID TEST- n/a   Anesthesia review: yes, anesthetic complications in 2023; pt was very confused, agitated for a couple of weeks after his knee surgery. Hx of Afib, HTN. Pt has mild dementia. Alert and oriented; forgetful; per wife he has bad days and good days. Isaiah, PA assessing the pt.   Patient denies shortness of breath, fever, cough and chest pain at PAT appointment   All instructions explained to the patient, with a verbal understanding of the material. Patient agrees to go over the instructions while at home for a better understanding. Patient also instructed to self quarantine after being tested for COVID-19. The opportunity to ask questions was provided.

## 2024-08-01 NOTE — Progress Notes (Signed)
 Anesthesia Chart Review:  Case: [id number] Date/Time: 08/06/24 0815   Procedure: ARTHROPLASTY, KNEE, TOTAL (Left: Knee)   Anesthesia type: Spinal   Diagnosis: Primary osteoarthritis of left knee [M17.12]   Pre-op diagnosis: osteoarthritis left knee   [facility] / [facility]   Surgeons: [name] Lonni GRADE, MD       DISCUSSION: Patient is a 74 year old male scheduled for the above procedure.   History includes former smoker (quit 06/27/1973), HTN, HLD, CVA/TIA (1 cm left brain infarct 01/2018 MRI), dementia, OSA (central and complex sleep apnea 07/23/2018; intolerant of CPAP), osteoarthritis (right shoulder rotator cuff repair, hemi-arthroplasty 01/12/2012; right TKA 05/30/2022).   For anesthesia history, his wife reported he had postoperative cognitive dysfunction (PCOD) following right TKA in 2023. He does not recall much, but says he was told he may have been rude at times to staff. His wife said he did not get any sleep the first night at surgery, as he was restless and agitated. He did not like the air flow pumps on his legs. He was discharged home on POD#2, but his it took several weeks for his mental status to return to baseline.    He was hospitalized 04/29/2024 - 05/06/2024 for weakness and afib with RVR. He had been sent from his PCP's office. He had tripped and hit his head on the wall the day prior. On Eliquis  and metoprolol  for known PAF history, 03/2023. No LOC or headaches. SBP was in the 80's.  He converted to SR in the ED after IV->oral Lopressor . Seen by cardiology. Prior TTE 04/2023 showed LVEF 60-65%, no RWMA, normal diastolic parameters, normal RV systolic function, moderate AV sclerosis, trivial AR, aortic root 39 mm. Given recurrent afib on relatively high dose of b-blocker, decision made to start on flecainide  100 mg BID. Given worsening cognitive changes following previous anesthesia, they hope to avoid DCCV. Metoprolol  decreased to 25 mg BID due to orthostatics. On  05/04/2024, he became more lethargy and CXR suggested probable right pneumonia.  Aspiration PNA from oropharyngeal dysphagia related to his dementia was suspected. He received IV Unasyn  for a few days then discharged on 7 more days of Augmentin .    He has since been followed up by  Nellene Bienenstock, PA at the [facility] on 05/23/2023 and by Dr. Court on 05/27/2024. Lopressor  decreased further for hypotension. He was in SR. Dr. Court thought sleep apnea may be contributing to his PAF, consider Inspire. CHA2DS2-VASc score is 5. Eliquis  5 mg BID, flecainide  100 mg BID, Lopressor  12.5 mg BID continued. He is on on Lipitor. Six month follow-up planned.  At his PAT visit, his HR RRR, lungs clear. Mild LE edema. No carotid bruits. He does not always know when he is in afib, but no known recurrent afib since his hospitalization. He denied chest pain, SOB at rest, no new DOE or edema. He denied palpitations, dizziness and syncope. He does not check his BP regularly at home, but HHPT will check his readings and has not had any significant lows recently.BP 116/86, HR 59.   His wife inquired about anesthesia type. It sounds like she had talked with a physician friend about spinal with light sedation. It appears right TKA was done with adductor canal block, spinal, propofol . He also received Versed  2 mg and fentanyl  150 mcg (total), dexamethasone , Zofran , cefazolin , TXA, neo-synephrine. They said he did not really use oxycodone  once he got home.  We discussed some risk factors of POCD. Age, underlying  dementia, prior CVA add to his risk of POCD. No significant alcohol  intake. , prior POCD add to his risk. No known liver or renal dysfunction. They realize cannot guarantee 0% risk for POCD, but anesthesiologist will take his history into consideration with his anesthesia plan. They were also encouraged to talk with orthopedics about post-operative medications choices since narcotics and benzodiazepines may exacerbate symptoms as  well.    Although he recently had follow-up with cardiology, he did not mention upcoming surgery plans. We discussed for spinal anesthesia, he would need to hold Eliquis  for 72 hours (last dose 08/02/2024 PM), but would want cardiology input first given he is on for PAF and potential for increased CVA risk off anticoagulation therapy. He has not been on ASA since he started Eliquis . Communication sent to Dr. Court and/or Cardiology Preoperative APP. Will leave chart for follow-up.    Last neurology follow-up with Dr. [name] was on 12/12/2023 for progressive mild dementia. Continue memantine  10 mg BID and stay active.    VS: BP 116/86   Pulse (!) 59   Temp 36.6 C   Resp 17   Ht 6' (1.829 m)   Wt 77.2 kg   SpO2 100%   BMI 23.08 kg/m    PROVIDERS: [name], DO is PCP  Court Carrier, MD is cardiologist  [name] Carne, MD is neurologist    LABS: Labs reviewed: Acceptable for surgery. (all labs ordered are listed, but only abnormal results are displayed)  Labs Reviewed  BASIC METABOLIC PANEL WITH GFR - Abnormal; Notable for the following components:      Result Value   Glucose, Bld 109 (*)    All other components within normal limits  SURGICAL PCR SCREEN  CBC  TYPE AND SCREEN    OTHER: PAP Titration Sleep Study 09/03/2018: DIAGNOSIS:  Central Sleep Apnea unresponsive to CPAP, BiPAP, and reportedly to BiPAP ST.  Abnormal EKG. Insomnia   PLANS/RECOMMENDATIONS: Goal was a full night titration to PAP modalities, but the patient responded only to lowest CPAP of 7 cm water (AHI at 1.0), as central apneas exacerbated with all other settings.    DISCUSSION: Strongly recommend return for ASV titration.    Baseline Polysomnogram 07/23/2018: IMPRESSION: Central -Complex Sleep Apnea with NREM sleep dominance (AHI 15.5/h). Supine AHI was 26.1/h. 2.   Abnormal EKG with frequent PVCs, some PACs and AV block.  3.   Few PLMs overall, but some movements were noted in REM  sleep. 4.   Insomnia or delayed sleep phase? Very poor sleep efficiency, extreme sleep latency.     IMAGES: CT Head 05/04/2024: IMPRESSION: 1. No evidence of acute intracranial abnormality. An MRI can provide more sensitive evaluation for acute infarct if clinically warranted. 2. Moderate chronic microvascular ischemic change.  1V PCXR 05/04/2024: FINDINGS: The heart is enlarged and the mediastinal contour is within normal limits. There is atherosclerotic calcification of the aorta. Scattered airspace opacities are noted in the right lung. No effusion or pneumothorax is seen. The left lung is clear. Right shoulder arthroplasty changes are present. No acute osseous abnormality. IMPRESSION: Scattered airspace opacities in the right lung, suspicious for pneumonia.  MRI Brain 07/26/2023: IMPRESSION:  Mild age-related changes of chronic small vessel disease and generalized cerebral atrophy.    EKG: 05/22/2024: SB at 57 bpm   CV: Echo 05/12/2023: IMPRESSIONS   1. Left ventricular ejection fraction, by estimation, is 60 to 65%. The  left ventricle has normal function. The left ventricle has no regional  wall motion  abnormalities. Left ventricular diastolic parameters were  normal. The average left ventricular  global longitudinal strain is -16.6 %. The global longitudinal strain is  normal.   2. Right ventricular systolic function is normal. The right ventricular  size is normal.   3. The mitral valve is abnormal. No evidence of mitral valve  regurgitation. No evidence of mitral stenosis.   4. The aortic valve is tricuspid. There is moderate calcification of the  aortic valve. There is moderate thickening of the aortic valve. Aortic  valve regurgitation is trivial. Aortic valve sclerosis is present, with no  evidence of aortic valve stenosis.   5. Aortic dilatation noted. There is mild dilatation of the aortic root,  measuring 39 mm.   6. The inferior vena cava is normal in size with  greater than 50%  respiratory variability, suggesting right atrial pressure of 3 mmHg.  - Comparison(s): EF 55%, trivial AI, mild MAC, AOR 39 mm.    Long term patch monitor 07/28/2022 - 08/11/2022: Patient had a min HR of 51 bpm, max HR of 167 bpm, and avg HR of 71 bpm. Predominant underlying rhythm was Sinus Rhythm.  25 Supraventricular Tachycardia runs occurred, the run with the fastest interval lasting 6 beats with a max rate of 167 bpm, the longest lasting 18 beats with an avg rate of 106 bpm. Some episodes of Supraventricular Tachycardia may be possible Atrial Tachycardia with variable block. Isolated SVEs were rare (<1.0%), SVE Couplets were rare (<1.0%), and SVE Triplets were rare (<1.0%). Isolated VEs were rare (<1.0%), VE Couplets were rare (<1.0%), and no VE Triplets were present.    1. SR/SB/ST  2. Occasional PACs/PVCs  3. Short runs of SVT (25 episodes longest lasting 18 beats)    US  Carotid 03/05/2018: Final Interpretation:  - Right Carotid: Velocities in the right ICA are consistent with a 1-39% stenosis.  - Left Carotid: Velocities in the left ICA are consistent with a 1-39% stenosis.  - Vertebrals: Bilateral vertebral arteries demonstrate antegrade flow.  - Subclavians: Normal flow hemodynamics were seen in bilateral subclavian  arteries.    Past Medical History:  Diagnosis Date   Arthritis    Complication of anesthesia 2023   Per pt' wife he had some complications after anesthesia in 2022. Became agitated and confused for a couple of weeks   Dementia (HCC)    see neurologist   HLD (hyperlipidemia)    Hypertension    dr  ed green      stress test   12 yrs ago   Pneumonia 2025   PVC's (premature ventricular contractions) 12 years ago   stress test done   Sleep apnea    Stroke [facility])    minor stroke 01/2018    Past Surgical History:  Procedure Laterality Date   ANKLE ARTHROSCOPY  06/27/2008   JOINT REPLACEMENT  06/27/2009   rt shoulder rotator cuff repair   knee  surgeries  8030,8006   x2   SHOULDER HEMI-ARTHROPLASTY  01/12/2012   Procedure: SHOULDER HEMI-ARTHROPLASTY;  Surgeon: Eva Elsie Herring, MD;  Location[facility];  Service: Orthopedics;  Laterality: Right;   SHOULDER SURGERY Right 07/2021   TOTAL KNEE ARTHROPLASTY Right 05/31/2022   Procedure: RIGHT TOTAL KNEE ARTHROPLASTY;  Surgeon: [name] Lonni GRADE, MD;  Location: [facility];  Service: Orthopedics;  Laterality: Right;    MEDICATIONS:  apixaban  (ELIQUIS ) 5 MG TABS tablet   atorvastatin  (LIPITOR) 20 MG tablet   flecainide  (TAMBOCOR ) 100 MG tablet   memantine  (NAMENDA ) 10 MG  tablet   metoprolol  tartrate (LOPRESSOR ) 25 MG tablet   Multiple Vitamins-Minerals (MULTIVITAMIN ADULTS PO)   No current facility-administered medications for this encounter.    Isaiah Ruder, PA-C Surgical Short Stay/Anesthesiology [facility] Phone [phone] [facility] Phone [phone] 08/01/2024 6:52 PM

## 2024-08-02 ENCOUNTER — Telehealth: Payer: Self-pay

## 2024-08-02 NOTE — Telephone Encounter (Signed)
 Pharmacy please advise on holding Eliquis  prior to left total knee arthoplasty  scheduled for TBD. Thank you.

## 2024-08-02 NOTE — Telephone Encounter (Signed)
" ° °  Name: Bryan Hart  DOB: 1951-02-13  MRN: [id number]  Primary Cardiologist: Dorn Lesches, MD   Preoperative team, please contact this patient and set up a phone call appointment for further preoperative risk assessment. Please obtain consent and complete medication review. Thank you for your help.  I confirm that guidance regarding antiplatelet and oral anticoagulation therapy has been completed and, if necessary, noted below.  I have contacted pharmacy for Eliquis  recommendations.  He is also seeing Afib clinic in February, but they do not give clearances.  I also confirmed the patient resides in the state of [place] . As per [facility] Medical Board telemedicine laws, the patient must reside in the state in which the provider is licensed.   Lamarr Satterfield, NP 08/02/2024, 3:19 PM [facility]    "

## 2024-08-02 NOTE — Telephone Encounter (Signed)
 1st attempt to reach pt regarding surgical clearance and the need for an TELE appointment.  Left pt a detailed message to call back and get that scheduled.

## 2024-08-02 NOTE — Telephone Encounter (Signed)
 Patient with diagnosis of atrial fibrillation on Eliquis  for anticoagulation.    Procedure:  L total knee arthorplasty   Date of Surgery:  Clearance TBD   CHA2DS2-VASc Score = 5   This indicates a 7.2% annual risk of stroke. The patient's score is based upon: CHF History: 0 HTN History: 1 Diabetes History: 0 Stroke History: 2 Vascular Disease History: 0 Age Score: 2 Gender Score: 0   Chart notes history of minor stroke in 2019  CrCl 63 Platelet count 207   Patient has not had an Afib/aflutter ablation in the last 3 months, DCCV within the last 4 weeks or a watchman implanted in the last 45 days    Per office protocol, patient can hold Eliquis  for 3 days prior to procedure.   Patient will not need bridging with Lovenox (enoxaparin) around procedure.  **This guidance is not considered finalized until pre-operative APP has relayed final recommendations.**

## 2024-08-02 NOTE — Telephone Encounter (Signed)
"  Pt returned call, please advise.   "

## 2024-08-02 NOTE — Anesthesia Preprocedure Evaluation (Signed)
"                                    Anesthesia Evaluation    Airway        Dental   Pulmonary former smoker          Cardiovascular hypertension,      Neuro/Psych    GI/Hepatic   Endo/Other    Renal/GU      Musculoskeletal   Abdominal   Peds  Hematology   Anesthesia Other Findings   Reproductive/Obstetrics                              Anesthesia Physical Anesthesia Plan  ASA:   Anesthesia Plan:    Post-op Pain Management:    Induction:   PONV Risk Score and Plan:   Airway Management Planned:   Additional Equipment:   Intra-op Plan:   Post-operative Plan:   Informed Consent:   Plan Discussed with:   Anesthesia Plan Comments: (PAT note written 08/01/2024 by Marcoantonio Legault, PA-C.  )        Anesthesia Quick Evaluation  "

## 2024-08-02 NOTE — Telephone Encounter (Signed)
"  ° °  Pre-operative Risk Assessment    Patient Name: Bryan Hart  DOB: [birth year] MRN: [id number]   Date of last office visit: 05/27/24 Date of next office visit: 08/23/23 (AFIB), 11/25/24 [name])   Request for Surgical Clearance    Procedure:  L total knee arthorplasty  Date of Surgery:  Clearance TBD                                Surgeon:  Not provided Surgeon's Group or Practice Name:  [facility] Phone number:  [phone] Fax number:  [phone]   Type of Clearance Requested:   - Medical  - Pharmacy:  Hold Apixaban  (Eliquis ) x3 days   Type of Anesthesia:  Spinal and Block  Additional requests/questions:    [name]   08/02/2024, 2:59 PM   "

## 2024-08-06 ENCOUNTER — Ambulatory Visit (HOSPITAL_COMMUNITY): Admission: RE | Admit: 2024-08-06 | Admitting: Orthopaedic Surgery

## 2024-08-06 ENCOUNTER — Encounter (HOSPITAL_COMMUNITY): Admission: RE | Payer: Self-pay | Source: Home / Self Care

## 2024-08-06 ENCOUNTER — Encounter (HOSPITAL_COMMUNITY): Payer: Self-pay | Admitting: Vascular Surgery

## 2024-08-06 SURGERY — ARTHROPLASTY, KNEE, TOTAL
Anesthesia: Spinal | Site: Knee | Laterality: Left

## 2024-08-19 ENCOUNTER — Encounter: Admitting: Orthopaedic Surgery

## 2024-08-22 ENCOUNTER — Ambulatory Visit (HOSPITAL_COMMUNITY): Admitting: Physician Assistant

## 2024-08-26 ENCOUNTER — Ambulatory Visit: Admitting: Podiatry

## 2024-09-03 ENCOUNTER — Ambulatory Visit: Admitting: Diagnostic Neuroimaging

## 2024-11-25 ENCOUNTER — Ambulatory Visit: Admitting: General Practice
